# Patient Record
Sex: Female | Born: 1980 | Race: White | Hispanic: No | Smoking: Current every day smoker
Health system: Southern US, Community
[De-identification: ages and names within clinical notes are randomized; demographics above are authoritative.]

## PROBLEM LIST (undated history)

## (undated) DIAGNOSIS — M659 Synovitis and tenosynovitis, unspecified: Secondary | ICD-10-CM

## (undated) DIAGNOSIS — B1920 Unspecified viral hepatitis C without hepatic coma: Secondary | ICD-10-CM

## (undated) DIAGNOSIS — B009 Herpesviral infection, unspecified: Secondary | ICD-10-CM

## (undated) DIAGNOSIS — O98519 Other viral diseases complicating pregnancy, unspecified trimester: Secondary | ICD-10-CM

## (undated) DIAGNOSIS — I82629 Acute embolism and thrombosis of deep veins of unspecified upper extremity: Secondary | ICD-10-CM

## (undated) DIAGNOSIS — J45909 Unspecified asthma, uncomplicated: Secondary | ICD-10-CM

## (undated) DIAGNOSIS — F1910 Other psychoactive substance abuse, uncomplicated: Secondary | ICD-10-CM

## (undated) DIAGNOSIS — A4902 Methicillin resistant Staphylococcus aureus infection, unspecified site: Secondary | ICD-10-CM

## (undated) HISTORY — PX: NECK SURGERY: SHX720

---

## 2003-11-14 DIAGNOSIS — Z8759 Personal history of other complications of pregnancy, childbirth and the puerperium: Secondary | ICD-10-CM

## 2004-09-27 ENCOUNTER — Emergency Department: Payer: Self-pay | Admitting: Emergency Medicine

## 2004-09-30 ENCOUNTER — Observation Stay: Payer: Self-pay

## 2004-10-03 ENCOUNTER — Emergency Department: Payer: Self-pay | Admitting: Emergency Medicine

## 2004-10-09 ENCOUNTER — Observation Stay: Payer: Self-pay | Admitting: Obstetrics and Gynecology

## 2008-06-21 ENCOUNTER — Emergency Department: Payer: Self-pay | Admitting: Emergency Medicine

## 2008-09-09 ENCOUNTER — Ambulatory Visit (HOSPITAL_COMMUNITY): Admission: RE | Admit: 2008-09-09 | Discharge: 2008-09-10 | Payer: Self-pay | Admitting: Neurosurgery
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
## 2011-07-29 ENCOUNTER — Emergency Department: Payer: Self-pay | Admitting: Emergency Medicine

## 2011-08-15 LAB — CBC
HCT: 35.1 — ABNORMAL LOW
Hemoglobin: 12.1
MCV: 96.9
Platelets: 251
WBC: 5.4

## 2011-08-15 LAB — URINALYSIS, ROUTINE W REFLEX MICROSCOPIC
Bilirubin Urine: NEGATIVE
Glucose, UA: NEGATIVE
Hgb urine dipstick: NEGATIVE
Ketones, ur: NEGATIVE
Specific Gravity, Urine: 1.013
Urobilinogen, UA: 0.2
pH: 7

## 2011-08-15 LAB — PREGNANCY, URINE: Preg Test, Ur: NEGATIVE

## 2011-08-15 LAB — URINE CULTURE: Culture: NO GROWTH

## 2011-11-13 ENCOUNTER — Emergency Department: Payer: Self-pay | Admitting: *Deleted

## 2012-11-13 DIAGNOSIS — O322XX0 Maternal care for transverse and oblique lie, not applicable or unspecified: Secondary | ICD-10-CM

## 2012-11-13 DIAGNOSIS — Z8759 Personal history of other complications of pregnancy, childbirth and the puerperium: Secondary | ICD-10-CM

## 2013-03-10 ENCOUNTER — Emergency Department: Payer: Self-pay | Admitting: Emergency Medicine

## 2013-03-17 DIAGNOSIS — F17200 Nicotine dependence, unspecified, uncomplicated: Secondary | ICD-10-CM | POA: Insufficient documentation

## 2013-03-18 DIAGNOSIS — F1910 Other psychoactive substance abuse, uncomplicated: Secondary | ICD-10-CM | POA: Insufficient documentation

## 2013-03-18 DIAGNOSIS — F1210 Cannabis abuse, uncomplicated: Secondary | ICD-10-CM | POA: Insufficient documentation

## 2013-03-19 DIAGNOSIS — A4902 Methicillin resistant Staphylococcus aureus infection, unspecified site: Secondary | ICD-10-CM

## 2013-03-19 HISTORY — DX: Methicillin resistant Staphylococcus aureus infection, unspecified site: A49.02

## 2013-05-18 DIAGNOSIS — O98519 Other viral diseases complicating pregnancy, unspecified trimester: Secondary | ICD-10-CM | POA: Insufficient documentation

## 2013-05-18 DIAGNOSIS — B009 Herpesviral infection, unspecified: Secondary | ICD-10-CM

## 2013-05-18 DIAGNOSIS — Z98891 History of uterine scar from previous surgery: Secondary | ICD-10-CM | POA: Insufficient documentation

## 2013-05-18 HISTORY — DX: Herpesviral infection, unspecified: B00.9

## 2013-05-18 HISTORY — DX: Other viral diseases complicating pregnancy, unspecified trimester: O98.519

## 2013-07-17 DIAGNOSIS — B1920 Unspecified viral hepatitis C without hepatic coma: Secondary | ICD-10-CM

## 2013-07-17 HISTORY — DX: Unspecified viral hepatitis C without hepatic coma: B19.20

## 2014-02-24 ENCOUNTER — Emergency Department: Payer: Self-pay | Admitting: Emergency Medicine

## 2014-02-24 LAB — BASIC METABOLIC PANEL
ANION GAP: 4 — AB (ref 7–16)
BUN: 17 mg/dL (ref 7–18)
CALCIUM: 9.1 mg/dL (ref 8.5–10.1)
CHLORIDE: 100 mmol/L (ref 98–107)
Co2: 30 mmol/L (ref 21–32)
Creatinine: 0.96 mg/dL (ref 0.60–1.30)
EGFR (Non-African Amer.): >60
Glucose: 100 mg/dL — ABNORMAL HIGH (ref 65–99)
OSMOLALITY: 270 (ref 275–301)
POTASSIUM: 3.7 mmol/L (ref 3.5–5.1)
Sodium: 134 mmol/L — ABNORMAL LOW (ref 136–145)

## 2014-02-24 LAB — CBC WITH DIFFERENTIAL/PLATELET
BASOS PCT: 0.6 %
Basophil #: 0 10*3/uL (ref 0.0–0.1)
Eosinophil #: 0.2 10*3/uL (ref 0.0–0.7)
Eosinophil %: 3.1 %
HCT: 35.4 % (ref 35.0–47.0)
HGB: 11.7 g/dL — AB (ref 12.0–16.0)
Lymphocyte #: 1.8 10*3/uL (ref 1.0–3.6)
Lymphocyte %: 27.4 %
MCH: 29.6 pg (ref 26.0–34.0)
MCHC: 33 g/dL (ref 32.0–36.0)
MCV: 90 fL (ref 80–100)
Monocyte #: 0.5 x10 3/mm (ref 0.2–0.9)
Monocyte %: 7.5 %
NEUTROS PCT: 61.4 %
Neutrophil #: 4 10*3/uL (ref 1.4–6.5)
PLATELETS: 305 10*3/uL (ref 150–440)
RBC: 3.94 10*6/uL (ref 3.80–5.20)
RDW: 13.1 % (ref 11.5–14.5)
WBC: 6.6 10*3/uL (ref 3.6–11.0)

## 2014-04-13 ENCOUNTER — Emergency Department: Payer: Self-pay | Admitting: Emergency Medicine

## 2014-08-19 DIAGNOSIS — I82629 Acute embolism and thrombosis of deep veins of unspecified upper extremity: Secondary | ICD-10-CM

## 2014-08-19 DIAGNOSIS — M659 Unspecified synovitis and tenosynovitis, unspecified site: Secondary | ICD-10-CM

## 2014-08-19 HISTORY — DX: Acute embolism and thrombosis of deep veins of unspecified upper extremity: I82.629

## 2014-08-19 HISTORY — DX: Synovitis and tenosynovitis, unspecified: M65.9

## 2014-08-19 HISTORY — DX: Unspecified synovitis and tenosynovitis, unspecified site: M65.90

## 2014-11-12 ENCOUNTER — Emergency Department: Payer: Self-pay | Admitting: Emergency Medicine

## 2014-11-13 NOTE — L&D Delivery Note (Signed)
Delivery Summary for Samantha Alphaiffany M Arias  Labor Events:   Preterm labor:   Rupture date:   Rupture time:   Rupture type:   Fluid Color:   Induction:   Augmentation:   Complications:   Cervical ripening:          Delivery:   Episiotomy:   Lacerations:   Repair suture:   Repair # of packets:   Blood loss (ml):    Information for the patient's newborn:  Shan LevansJohnson, Girl Dejanae [[id number]][[id number]]    Delivery 05/18/2015 2:17 PM by  C-Section, Low Transverse Sex:  female Gestational Age: 3752w4d Delivery Clinician:  Hildred LaserAnika Keeleigh Terris Living?: Yes        APGARS  One minute Five minutes Ten minutes  Skin color: 0   0   1    Heart rate: 2   2   2     Grimace: 1   1   2     Muscle tone: 1   2   1     Breathing: 0   1   2    Totals: 4  6  8     Presentation/position:      Resuscitation: Yes = See NRP documentation in Infant Chart See NICU Consult (infant's chart)  Cord information: 3 vessels   Disposition of cord blood:     Blood gases sent?  Complications:   Placenta: Delivered: 05/18/2015 2:17 PM     appearance Newborn Measurements: Weight: 2 lb 11.7 oz (1240 g)  Height: 14.96"  Head circumference:    Chest circumference:    Other providers:   Vale HavenJamie A Coble Manal D [place]  Additional  information: Forceps:   Vacuum:   Breech:   Observed anomalies        Hildred LaserAnika Darlisa Spruiell, MD [facility]

## 2014-12-08 DIAGNOSIS — F1010 Alcohol abuse, uncomplicated: Secondary | ICD-10-CM | POA: Insufficient documentation

## 2014-12-08 DIAGNOSIS — O99320 Drug use complicating pregnancy, unspecified trimester: Secondary | ICD-10-CM

## 2014-12-08 DIAGNOSIS — Z7251 High risk heterosexual behavior: Secondary | ICD-10-CM | POA: Insufficient documentation

## 2014-12-08 DIAGNOSIS — F1910 Other psychoactive substance abuse, uncomplicated: Secondary | ICD-10-CM | POA: Insufficient documentation

## 2014-12-14 ENCOUNTER — Emergency Department: Payer: Self-pay | Admitting: Emergency Medicine

## 2014-12-14 LAB — COMPREHENSIVE METABOLIC PANEL
ALBUMIN: 3.6 g/dL (ref 3.4–5.0)
ANION GAP: 7 (ref 7–16)
AST: 16 U/L (ref 15–37)
Alkaline Phosphatase: 68 U/L (ref 46–116)
BILIRUBIN TOTAL: 0.4 mg/dL (ref 0.2–1.0)
BUN: 8 mg/dL (ref 7–18)
CHLORIDE: 105 mmol/L (ref 98–107)
CREATININE: 0.68 mg/dL (ref 0.60–1.30)
Calcium, Total: 8.6 mg/dL (ref 8.5–10.1)
Co2: 22 mmol/L (ref 21–32)
EGFR (Non-African Amer.): >60
GLUCOSE: 78 mg/dL (ref 65–99)
OSMOLALITY: 265 (ref 275–301)
POTASSIUM: 3.6 mmol/L (ref 3.5–5.1)
SGPT (ALT): 17 U/L (ref 14–63)
SODIUM: 134 mmol/L — AB (ref 136–145)
TOTAL PROTEIN: 8 g/dL (ref 6.4–8.2)

## 2014-12-14 LAB — CBC WITH DIFFERENTIAL/PLATELET
BASOS ABS: 0 10*3/uL (ref 0.0–0.1)
BASOS PCT: 0.3 %
EOS ABS: 0.3 10*3/uL (ref 0.0–0.7)
Eosinophil %: 1.9 %
HCT: 40.1 % (ref 35.0–47.0)
HGB: 13.4 g/dL (ref 12.0–16.0)
LYMPHS PCT: 19.8 %
Lymphocyte #: 2.8 10*3/uL (ref 1.0–3.6)
MCH: 31.8 pg (ref 26.0–34.0)
MCHC: 33.4 g/dL (ref 32.0–36.0)
MCV: 95 fL (ref 80–100)
MONOS PCT: 3.8 %
Monocyte #: 0.5 x10 3/mm (ref 0.2–0.9)
NEUTROS ABS: 10.5 10*3/uL — AB (ref 1.4–6.5)
NEUTROS PCT: 74.2 %
Platelet: 257 10*3/uL (ref 150–440)
RBC: 4.21 10*6/uL (ref 3.80–5.20)
RDW: 15.7 % — AB (ref 11.5–14.5)
WBC: 14.1 10*3/uL — AB (ref 3.6–11.0)

## 2014-12-14 LAB — URINALYSIS, COMPLETE
BACTERIA: NONE SEEN
BILIRUBIN, UR: NEGATIVE
Blood: NEGATIVE
GLUCOSE, UR: NEGATIVE mg/dL (ref 0–75)
KETONE: NEGATIVE
Nitrite: NEGATIVE
PH: 6 (ref 4.5–8.0)
Protein: 30
RBC, UR: NONE SEEN /HPF (ref 0–5)
Specific Gravity: 1.009 (ref 1.003–1.030)

## 2014-12-14 LAB — LIPASE, BLOOD: Lipase: 67 U/L — ABNORMAL LOW (ref 73–393)

## 2015-03-15 LAB — OB RESULTS CONSOLE GC/CHLAMYDIA
Chlamydia: NEGATIVE
Gonorrhea: NEGATIVE

## 2015-04-16 ENCOUNTER — Telehealth: Payer: Self-pay

## 2015-04-16 NOTE — Telephone Encounter (Signed)
-----   Message from Hildred LaserAnika Cherry, MD sent at 04/15/2015  4:19 PM EDT ----- Regarding: normal pap Normal pap.  Can inform.

## 2015-04-16 NOTE — Telephone Encounter (Signed)
Numbers listed in chart are inactive. Will mail pt letter.

## 2015-05-13 ENCOUNTER — Encounter: Payer: Self-pay | Admitting: Obstetrics and Gynecology

## 2015-05-13 ENCOUNTER — Other Ambulatory Visit: Payer: Medicaid Other

## 2015-05-13 ENCOUNTER — Other Ambulatory Visit: Payer: Self-pay

## 2015-05-13 ENCOUNTER — Ambulatory Visit (INDEPENDENT_AMBULATORY_CARE_PROVIDER_SITE_OTHER): Payer: Medicaid Other | Admitting: Obstetrics and Gynecology

## 2015-05-13 VITALS — BP 98/53 | HR 74 | Ht 66.5 in | Wt 143.0 lb

## 2015-05-13 DIAGNOSIS — Z23 Encounter for immunization: Secondary | ICD-10-CM

## 2015-05-13 DIAGNOSIS — Z131 Encounter for screening for diabetes mellitus: Secondary | ICD-10-CM

## 2015-05-13 DIAGNOSIS — O360911 Maternal care for other rhesus isoimmunization, first trimester, fetus 1: Secondary | ICD-10-CM

## 2015-05-13 DIAGNOSIS — K219 Gastro-esophageal reflux disease without esophagitis: Secondary | ICD-10-CM

## 2015-05-13 DIAGNOSIS — O99612 Diseases of the digestive system complicating pregnancy, second trimester: Secondary | ICD-10-CM

## 2015-05-13 DIAGNOSIS — Z3491 Encounter for supervision of normal pregnancy, unspecified, first trimester: Secondary | ICD-10-CM

## 2015-05-13 DIAGNOSIS — O360121 Maternal care for anti-D [Rh] antibodies, second trimester, fetus 1: Secondary | ICD-10-CM

## 2015-05-13 DIAGNOSIS — Z3481 Encounter for supervision of other normal pregnancy, first trimester: Secondary | ICD-10-CM

## 2015-05-13 DIAGNOSIS — O3663X0 Maternal care for excessive fetal growth, third trimester, not applicable or unspecified: Secondary | ICD-10-CM

## 2015-05-13 DIAGNOSIS — Z369 Encounter for antenatal screening, unspecified: Secondary | ICD-10-CM

## 2015-05-13 LAB — POCT URINALYSIS DIPSTICK
BILIRUBIN UA: NEGATIVE
Blood, UA: NEGATIVE
GLUCOSE UA: NEGATIVE
KETONES UA: NEGATIVE
LEUKOCYTES UA: NEGATIVE
Nitrite, UA: NEGATIVE
Protein, UA: NEGATIVE
SPEC GRAV UA: 1.025
UROBILINOGEN UA: 0.2
pH, UA: 6

## 2015-05-13 MED ORDER — TETANUS-DIPHTH-ACELL PERTUSSIS 5-2.5-18.5 LF-MCG/0.5 IM SUSP
0.5000 mL | Freq: Once | INTRAMUSCULAR | Status: AC
  Administered 2015-05-13: 0.5 mL via INTRAMUSCULAR

## 2015-05-13 MED ORDER — PANTOPRAZOLE SODIUM 40 MG PO TBEC: 40.0000 mg | DELAYED_RELEASE_TABLET | Freq: Every day | ORAL | Status: DC

## 2015-05-13 MED ORDER — RHO D IMMUNE GLOBULIN 1500 UNITS IM SOSY
1500.0000 [IU] | PREFILLED_SYRINGE | Freq: Once | INTRAMUSCULAR | Status: AC
  Administered 2015-05-13: 1500 [IU] via INTRAMUSCULAR

## 2015-05-14 ENCOUNTER — Encounter: Payer: Self-pay | Admitting: Obstetrics and Gynecology

## 2015-05-14 LAB — HEMOGLOBIN AND HEMATOCRIT, BLOOD
HEMATOCRIT: 24.6 % — AB (ref 34.0–46.6)
HEMOGLOBIN: 8 g/dL — AB (ref 11.1–15.9)

## 2015-05-14 LAB — GLUCOSE TOLERANCE, 1 HOUR: Glucose, 1Hr PP: 84 mg/dL (ref 65–199)

## 2015-05-15 NOTE — Progress Notes (Signed)
Addendum to prior note:   Patient also complained of heartburn not relieved by Tums, Zantac.  Prescribed Protonix.

## 2015-05-15 NOTE — Progress Notes (Signed)
ROB: Patient reports complaints of occasional belly tightening.  Advised on resting, adequate hydration and Tylenol.  Fundal height lagging.  Discussed with patient.  Will need f/u ultrasound (was scheduled for f/u anatomy scan ~ 2-3 weeks ago but did not f/u).  Discussed causes, including smoking cessation.  Notes she was encouraged by [employer] Department to receive prophylactic antenatal steroids in light of h/o preterm deliveries.  Will order.  Patient for Rhogam and Tdap today.  28 week labs done.  Discussed Pregnancy Home Coordination Program, patient completed forms. RTC in 2 weeks. Ultrasound ordered.

## 2015-05-17 ENCOUNTER — Encounter: Payer: Self-pay | Admitting: Obstetrics and Gynecology

## 2015-05-17 ENCOUNTER — Observation Stay: Payer: Medicaid Other

## 2015-05-17 ENCOUNTER — Inpatient Hospital Stay
Admission: EM | Admit: 2015-05-17 | Discharge: 2015-05-21 | DRG: 765 | Disposition: A | Discharge status: Home / Self Care | Payer: Medicaid Other | Attending: Obstetrics and Gynecology | Admitting: Obstetrics and Gynecology

## 2015-05-17 ENCOUNTER — Other Ambulatory Visit: Payer: Self-pay | Admitting: Obstetrics and Gynecology

## 2015-05-17 DIAGNOSIS — O9842 Viral hepatitis complicating childbirth: Secondary | ICD-10-CM | POA: Diagnosis present

## 2015-05-17 DIAGNOSIS — O479 False labor, unspecified: Secondary | ICD-10-CM | POA: Diagnosis present

## 2015-05-17 DIAGNOSIS — Z3A30 30 weeks gestation of pregnancy: Secondary | ICD-10-CM | POA: Diagnosis present

## 2015-05-17 DIAGNOSIS — O99324 Drug use complicating childbirth: Secondary | ICD-10-CM | POA: Diagnosis present

## 2015-05-17 DIAGNOSIS — K661 Hemoperitoneum: Secondary | ICD-10-CM | POA: Diagnosis present

## 2015-05-17 DIAGNOSIS — D500 Iron deficiency anemia secondary to blood loss (chronic): Secondary | ICD-10-CM | POA: Diagnosis present

## 2015-05-17 DIAGNOSIS — O99314 Alcohol use complicating childbirth: Secondary | ICD-10-CM | POA: Diagnosis present

## 2015-05-17 DIAGNOSIS — A6000 Herpesviral infection of urogenital system, unspecified: Secondary | ICD-10-CM | POA: Diagnosis present

## 2015-05-17 DIAGNOSIS — Z98891 History of uterine scar from previous surgery: Secondary | ICD-10-CM

## 2015-05-17 DIAGNOSIS — O4700 False labor before 37 completed weeks of gestation, unspecified trimester: Secondary | ICD-10-CM | POA: Diagnosis present

## 2015-05-17 DIAGNOSIS — O9832 Other infections with a predominantly sexual mode of transmission complicating childbirth: Secondary | ICD-10-CM | POA: Diagnosis present

## 2015-05-17 DIAGNOSIS — O4593 Premature separation of placenta, unspecified, third trimester: Principal | ICD-10-CM | POA: Diagnosis present

## 2015-05-17 DIAGNOSIS — Y763 Surgical instruments, materials and obstetric and gynecological devices (including sutures) associated with adverse incidents: Secondary | ICD-10-CM

## 2015-05-17 DIAGNOSIS — O3421 Maternal care for scar from previous cesarean delivery: Secondary | ICD-10-CM | POA: Diagnosis present

## 2015-05-17 DIAGNOSIS — O0933 Supervision of pregnancy with insufficient antenatal care, third trimester: Secondary | ICD-10-CM

## 2015-05-17 DIAGNOSIS — O9989 Other specified diseases and conditions complicating pregnancy, childbirth and the puerperium: Secondary | ICD-10-CM | POA: Diagnosis present

## 2015-05-17 DIAGNOSIS — O4100X0 Oligohydramnios, unspecified trimester, not applicable or unspecified: Secondary | ICD-10-CM | POA: Diagnosis present

## 2015-05-17 DIAGNOSIS — B1920 Unspecified viral hepatitis C without hepatic coma: Secondary | ICD-10-CM | POA: Diagnosis present

## 2015-05-17 DIAGNOSIS — O34219 Maternal care for unspecified type scar from previous cesarean delivery: Secondary | ICD-10-CM | POA: Diagnosis present

## 2015-05-17 DIAGNOSIS — Z86718 Personal history of other venous thrombosis and embolism: Secondary | ICD-10-CM

## 2015-05-17 DIAGNOSIS — O99019 Anemia complicating pregnancy, unspecified trimester: Secondary | ICD-10-CM | POA: Diagnosis present

## 2015-05-17 DIAGNOSIS — F1290 Cannabis use, unspecified, uncomplicated: Secondary | ICD-10-CM | POA: Diagnosis present

## 2015-05-17 HISTORY — DX: Herpesviral infection, unspecified: B00.9

## 2015-05-17 HISTORY — DX: Unspecified viral hepatitis C without hepatic coma: B19.20

## 2015-05-17 HISTORY — DX: Other viral diseases complicating pregnancy, unspecified trimester: O98.519

## 2015-05-17 HISTORY — DX: Acute embolism and thrombosis of deep veins of unspecified upper extremity: I82.629

## 2015-05-17 HISTORY — DX: Synovitis and tenosynovitis, unspecified: M65.9

## 2015-05-17 HISTORY — DX: Methicillin resistant Staphylococcus aureus infection, unspecified site: A49.02

## 2015-05-17 LAB — URINE DRUG SCREEN, QUALITATIVE (ARMC ONLY)
AMPHETAMINES, UR SCREEN: NOT DETECTED
BARBITURATES, UR SCREEN: NOT DETECTED
Benzodiazepine, Ur Scrn: NOT DETECTED
Cannabinoid 50 Ng, Ur ~~LOC~~: POSITIVE — AB
Cocaine Metabolite,Ur ~~LOC~~: POSITIVE — AB
MDMA (ECSTASY) UR SCREEN: NOT DETECTED
Methadone Scn, Ur: NOT DETECTED
Opiate, Ur Screen: POSITIVE — AB
Phencyclidine (PCP) Ur S: NOT DETECTED
Tricyclic, Ur Screen: NOT DETECTED

## 2015-05-17 LAB — CBC
HCT: 27.7 % — ABNORMAL LOW (ref 35.0–47.0)
Hemoglobin: 9.1 g/dL — ABNORMAL LOW (ref 12.0–16.0)
MCH: 31.9 pg (ref 26.0–34.0)
MCHC: 32.9 g/dL (ref 32.0–36.0)
MCV: 97 fL (ref 80.0–100.0)
Platelets: 313 10*3/uL (ref 150–440)
RBC: 2.85 MIL/uL — ABNORMAL LOW (ref 3.80–5.20)
RDW: 12.9 % (ref 11.5–14.5)
WBC: 27.9 10*3/uL — ABNORMAL HIGH (ref 3.6–11.0)

## 2015-05-17 LAB — COMPREHENSIVE METABOLIC PANEL
ALT: 11 U/L — ABNORMAL LOW (ref 14–54)
ANION GAP: 12 (ref 5–15)
AST: 28 U/L (ref 15–41)
Albumin: 2.6 g/dL — ABNORMAL LOW (ref 3.5–5.0)
Alkaline Phosphatase: 122 U/L (ref 38–126)
BILIRUBIN TOTAL: 0.6 mg/dL (ref 0.3–1.2)
BUN: 9 mg/dL (ref 6–20)
CALCIUM: 8.3 mg/dL — AB (ref 8.9–10.3)
CO2: 19 mmol/L — AB (ref 22–32)
CREATININE: 0.88 mg/dL (ref 0.44–1.00)
Chloride: 99 mmol/L — ABNORMAL LOW (ref 101–111)
GFR calc Af Amer: >60 mL/min (ref >60)
GLUCOSE: 146 mg/dL — AB (ref 65–99)
POTASSIUM: 4.2 mmol/L (ref 3.5–5.1)
SODIUM: 130 mmol/L — AB (ref 135–145)
TOTAL PROTEIN: 6.1 g/dL — AB (ref 6.5–8.1)

## 2015-05-17 LAB — DIFFERENTIAL
Basophils Absolute: 0 10*3/uL (ref 0–0.1)
Basophils Relative: 0 %
EOS PCT: 0 %
Eosinophils Absolute: 0 10*3/uL (ref 0–0.7)
Lymphocytes Relative: 2 %
Lymphs Abs: 0.7 10*3/uL — ABNORMAL LOW (ref 1.0–3.6)
MONO ABS: 0.6 10*3/uL (ref 0.2–0.9)
Monocytes Relative: 2 %
Neutro Abs: 26.6 10*3/uL — ABNORMAL HIGH (ref 1.4–6.5)
Neutrophils Relative %: 96 %

## 2015-05-17 MED ORDER — DIPHENHYDRAMINE HCL 50 MG/ML IJ SOLN: 25.0000 mg | Freq: Four times a day (QID) | INTRAMUSCULAR | Status: DC | PRN

## 2015-05-17 MED ORDER — CEPHALEXIN 500 MG PO CAPS: 500.0000 mg | ORAL_CAPSULE | Freq: Four times a day (QID) | ORAL | Status: DC

## 2015-05-17 MED ORDER — MAGNESIUM SULFATE 50 % IJ SOLN
2.0000 g/h | INTRAVENOUS | Status: DC
  Administered 2015-05-17: 2 g/h via INTRAVENOUS
  Filled 2015-05-17: qty 80

## 2015-05-17 MED ORDER — HYDROCODONE-ACETAMINOPHEN 5-325 MG PO TABS
1.0000 | ORAL_TABLET | ORAL | Status: DC | PRN
  Administered 2015-05-18 (×2): 1 via ORAL

## 2015-05-17 MED ORDER — TERBUTALINE SULFATE 1 MG/ML IJ SOLN
INTRAMUSCULAR | Status: AC
  Administered 2015-05-17: 0.25 mg
  Filled 2015-05-17: qty 1

## 2015-05-17 MED ORDER — ASPIRIN EC 81 MG PO TBEC
81.0000 mg | DELAYED_RELEASE_TABLET | Freq: Every day | ORAL | Status: DC
  Administered 2015-05-18: 81 mg via ORAL
  Filled 2015-05-17 (×2): qty 1

## 2015-05-17 MED ORDER — FENTANYL CITRATE (PF) 100 MCG/2ML IJ SOLN
INTRAMUSCULAR | Status: AC
  Administered 2015-05-17: 50 ug via INTRAVENOUS
  Filled 2015-05-17: qty 2

## 2015-05-17 MED ORDER — DIPHENHYDRAMINE HCL 25 MG PO CAPS: 25.0000 mg | ORAL_CAPSULE | Freq: Four times a day (QID) | ORAL | Status: DC | PRN

## 2015-05-17 MED ORDER — SODIUM CHLORIDE 0.9 % IV SOLN
INTRAVENOUS | Status: DC
  Administered 2015-05-18 (×2): via INTRAVENOUS

## 2015-05-17 MED ORDER — PANTOPRAZOLE SODIUM 40 MG PO TBEC
40.0000 mg | DELAYED_RELEASE_TABLET | Freq: Every day | ORAL | Status: DC
  Administered 2015-05-18 – 2015-05-21 (×3): 40 mg via ORAL
  Filled 2015-05-17 (×4): qty 1

## 2015-05-17 MED ORDER — TERBUTALINE SULFATE 1 MG/ML IJ SOLN: 0.2500 mg | Freq: Once | INTRAMUSCULAR | Status: AC | PRN

## 2015-05-17 MED ORDER — PRENATAL MULTIVITAMIN CH: 1.0000 | ORAL_TABLET | Freq: Every day | ORAL | Status: DC

## 2015-05-17 MED ORDER — DOCUSATE SODIUM 100 MG PO CAPS
100.0000 mg | ORAL_CAPSULE | Freq: Every day | ORAL | Status: DC
Start: 2015-05-17 — End: 2015-05-18
  Administered 2015-05-18: 100 mg via ORAL
  Filled 2015-05-17: qty 1

## 2015-05-17 MED ORDER — TERBUTALINE SULFATE 1 MG/ML IJ SOLN
INTRAMUSCULAR | Status: AC
  Filled 2015-05-17: qty 1

## 2015-05-17 MED ORDER — BETAMETHASONE SOD PHOS & ACET 6 (3-3) MG/ML IJ SUSP
INTRAMUSCULAR | Status: AC
  Administered 2015-05-17: 12 mg via INTRAMUSCULAR
  Filled 2015-05-17: qty 1

## 2015-05-17 MED ORDER — ZOLPIDEM TARTRATE 5 MG PO TABS
5.0000 mg | ORAL_TABLET | Freq: Every evening | ORAL | Status: DC | PRN
  Administered 2015-05-17: 5 mg via ORAL

## 2015-05-17 MED ORDER — MAGNESIUM SULFATE BOLUS VIA INFUSION
4.0000 g | Freq: Once | INTRAVENOUS | Status: DC
Start: 2015-05-17 — End: 2015-05-18
  Filled 2015-05-17: qty 500

## 2015-05-17 MED ORDER — CEFAZOLIN SODIUM 1-5 GM-% IV SOLN
1.0000 g | Freq: Three times a day (TID) | INTRAVENOUS | Status: DC
  Administered 2015-05-17 – 2015-05-18 (×2): 1 g via INTRAVENOUS
  Filled 2015-05-17 (×3): qty 50

## 2015-05-17 MED ORDER — ACETAMINOPHEN 325 MG PO TABS: 650.0000 mg | ORAL_TABLET | ORAL | Status: DC | PRN

## 2015-05-17 MED ORDER — BETAMETHASONE SOD PHOS & ACET 6 (3-3) MG/ML IJ SUSP
12.0000 mg | INTRAMUSCULAR | Status: DC
  Administered 2015-05-17: 12 mg via INTRAMUSCULAR
  Filled 2015-05-17: qty 2

## 2015-05-17 MED ORDER — MAGNESIUM SULFATE 4 GM/100ML IV SOLN
INTRAVENOUS | Status: AC
  Administered 2015-05-17: 4 g
  Filled 2015-05-17: qty 100

## 2015-05-17 MED ORDER — CEFAZOLIN SODIUM 1-5 GM-% IV SOLN
INTRAVENOUS | Status: AC
  Administered 2015-05-17: 1 g via INTRAVENOUS
  Filled 2015-05-17: qty 50

## 2015-05-17 MED ORDER — ZOLPIDEM TARTRATE 5 MG PO TABS
ORAL_TABLET | ORAL | Status: AC
  Administered 2015-05-17: 5 mg via ORAL
  Filled 2015-05-17: qty 1

## 2015-05-17 MED ORDER — BUTORPHANOL TARTRATE 1 MG/ML IJ SOLN
INTRAMUSCULAR | Status: AC
  Administered 2015-05-17: 2 mg via INTRAVENOUS
  Filled 2015-05-17: qty 2

## 2015-05-17 MED ORDER — BUTORPHANOL TARTRATE 1 MG/ML IJ SOLN
2.0000 mg | INTRAMUSCULAR | Status: DC | PRN
Start: 2015-05-17 — End: 2015-05-18
  Administered 2015-05-17 – 2015-05-18 (×2): 2 mg via INTRAVENOUS

## 2015-05-17 MED ORDER — AZITHROMYCIN 500 MG PO TABS
1000.0000 mg | ORAL_TABLET | Freq: Once | ORAL | Status: AC
  Administered 2015-05-17: 1000 mg via ORAL
  Filled 2015-05-17: qty 2

## 2015-05-17 NOTE — H&P (Addendum)
Subjective:  [name] is a 34 y.o. G3 P0 female with Estimated Date of Delivery: 07/23/15 by ultrasound (2nd trimester)   at 54 and 3/[redacted] weeks gestation who is being admitted for preterm contractions and possible rupture of membranes.  Her current obstetrical history is significant for prior c-section x 1 (classical), history of pre-eclampsia in prior 2 pregnancies, 2 fetal demises, h/o drug use (cocaine, marijuana, alcohol). Denies any recent drug use outside of marijuana 3 days ago. Patient reports backache, occasional contractions and leaking fluid since 10:00 a.m.Marland Kitchen   Fetal Movement: normal.    Obstetric History   G3   P1   T0   P1   A1   TAB0   SAB0   E0   M0   L0     # Outcome Date GA Lbr Len/2nd Weight Sex Delivery Anes PTL Lv  3 Current           2 Preterm 2014 [redacted]w[redacted]d   F CS-classical   ND     Complications: History of pre-eclampsia  1 AB 2005 [redacted]w[redacted]d   M    FD     Complications: History of pre-eclampsia      Prenatal Course Source of Care: [facility] with onset of care at 13 weeks, transferred [facility] care at 24 weeks to [facility].   Pregnancy complications or risks: Patient Active Problem List   Diagnosis Date Noted  . Preterm uterine contractions 05/17/2015  . Oligohydramnios antepartum 05/17/2015  . H/O cesarean section complicating pregnancy 05/17/2015  . Preterm contractions 05/17/2015  . AA (alcohol abuse) 12/08/2014  . Drug abuse during pregnancy 12/08/2014  . High risk sexual behavior 12/08/2014  . Tenosynovitis 08/19/2014  . Acute deep vein thrombosis of arm 08/19/2014  . HCV (hepatitis C virus) 07/17/2013  . H/O cesarean section 05/18/2013  . Herpes infection in pregnancy 05/18/2013  . Infection with methicillin-resistant Staphylococcus aureus 03/19/2013  . Cannabis abuse 03/18/2013  . Polysubstance abuse 03/18/2013  . Compulsive tobacco user syndrome 03/17/2013   She undecided She desires undecided for postpartum contraception.   Prenatal  labs and studies:  Did not receive prenatal records from [facility], old labs in Care Everywhere with labs from 2014.  ABO, Rh: A negative (received Rhogam 05/13/15)   Antibody:   Rubella:   RPR:    HBsAg:    HIV:    GBS:  1 hr Glucola  Pending Genetic screening pending records Anatomy US not completed, patient missed appointment  Prenatal Transfer Tool  Maternal Diabetes: No Genetic Screening: records not received Maternal Ultrasounds/Referrals: records not received Fetal Ultrasounds or other Referrals:   records not received Maternal Substance Abuse:  Yes:  Type: Smoker, Marijuana, Cocaine Significant Maternal Medications:  None Significant Maternal Lab Results:  records not received  Past Medical History  Diagnosis Date  . HCV (hepatitis C virus) 07/17/2013    Last Assessment & Plan:  - positive antibody most recently 08/2014 - hep C RNA 08/2014 was negative - prior to that, HCV RNA [id number] in 06/2013. No record for genotype or imaging.   - patient counseled that she could become re-infected with continued IVDU or unprotected sexual encounters.   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   Last Assessment & Plan:  Discussed routine use of Valtrex prophylaxis at 36 weeks. She will be delivered by cesarean at 36-37 weeks.   . Infection with methicillin-resistant Staphylococcus aureus 03/19/2013  .  Tenosynovitis 08/19/2014  . Acute deep vein thrombosis of arm 08/19/2014    Last Assessment & Plan:  - diagnosed 08/2014 in hospital - s/p heparin gtt - patient never took her xarelto x 3 month course that was planned - no symptoms today.  No further treatment - we discussed avoiding IVDU (see below) to reduce her chances of provoked upper extremity DVT     Past Surgical History  Procedure Laterality Date  . Cesarean section    . Neck surgery      Fusion    History   Social History  . Marital Status: Single    Spouse Name: N/A  . Number  of Children: N/A  . Years of Education: N/A   Social History Main Topics  . Smoking status: Current Every Day Smoker -- 1.00 packs/day for 7 years    Types: Cigarettes  . Smokeless tobacco: Never Used  . Alcohol Use: No  . Drug Use: Yes    Special: Marijuana  . Sexual Activity: Yes    Birth Control/ Protection: None     Comment: Pregnant   Other Topics Concern  . Not on file   Social History Narrative    Family History  Problem Relation Age of Onset  . Diabetes Mother   . COPD Mother   . Hypertension Mother   . Lung cancer Mother   . Alcohol abuse Father   . Lung cancer Maternal Grandmother     Prescriptions prior to admission  Medication Sig Dispense Refill Last Dose  . aspirin EC 81 MG tablet Take 81 mg by mouth daily.   05/17/2015 at Unknown time  . pantoprazole (PROTONIX) 40 MG tablet Take 1 tablet (40 mg total) by mouth daily. 30 tablet 3 05/17/2015 at Unknown time  . Prenatal Vit-Fe Fumarate-FA (MULTIVITAMIN-PRENATAL) 27-0.8 MG TABS tablet Take 1 tablet by mouth daily at 12 noon.   05/17/2015 at Unknown time    Allergies  Allergen Reactions  . Amoxicillin Rash  . Latex   . Vancomycin     Review of Systems: Negative except for what is mentioned in HPI.  Pertinent Labs/Studies:   Results for orders placed or performed during the hospital encounter of 05/17/15 (from the past 24 hour(s))  Urine Drug Screen, Qualitative ([facility] only)     Status: Abnormal   Collection Time: 05/17/15  8:39 PM  Result Value Ref Range   Tricyclic, Ur Screen NONE DETECTED NONE DETECTED   Amphetamines, Ur Screen NONE DETECTED NONE DETECTED   MDMA (Ecstasy)Ur Screen NONE DETECTED NONE DETECTED   Cocaine Metabolite,Ur [place] POSITIVE (A) NONE DETECTED   Opiate, Ur Screen POSITIVE (A) NONE DETECTED   Phencyclidine (PCP) Ur S NONE DETECTED NONE DETECTED   Cannabinoid 50 Ng, Ur [place] POSITIVE (A) NONE DETECTED   Barbiturates, Ur Screen NONE DETECTED NONE DETECTED   Benzodiazepine, Ur Scrn NONE  DETECTED NONE DETECTED   Methadone Scn, Ur NONE DETECTED NONE DETECTED    Objective:   Vital signs in last 24 hours: Temp:  [97.6 F (36.4 C)] 97.6 F (36.4 C) (07/04 2035) Pulse Rate:  [93-116] 116 (07/04 2022) Resp:  [18] 18 (07/04 2035) BP: (99-128)/(43-85) 99/85 mmHg (07/04 2022) Weight:  [143 lb (64.864 kg)] 143 lb (64.864 kg) (07/04 2035)   General:   alert and moderate distress  Skin:   normal and old track marks  in forearms and wrists from prior drug use  HEENT:  extra ocular movement intact  Lungs:   clear to auscultation  bilaterally  Heart:   regular rate and rhythm, S1, S2 normal, no murmur, click, rub or gallop  Breasts:   not examined  Abdomen:  normal findings: soft and abnormal findings:  tenderness around abdomen  Pelvis:  External genitalia: normal general appearance Vaginal: normal mucosa without prolapse or lesions and discharge, white and small amount, thick, clumpy Cervix: normal appearance and 1 cm  No gross pooling, nitrazine neg x 2  FHT:  160 BPM.  Moderate variability, accels present, 2  decels present from baseline 140s to 80s lasting 3 min at initial presentation.   Uterine Size: 26 cm  Presentations: unsure  Cervix:    Dilation: 1cm   Effacement: Long   Station:  Floating   Consistency: firm   Position: posterior    Bedside ultrasound:  EGA 29.3 weeks, EDC 07/30/2015, EFW 2 lb 14 oz (1312 g), 29%ile.  1 pocket of fluid noted 2.6 cm. Normal appearing placenta.  Assessment/Plan:  Admit to observation 30 and 3/[redacted] weeks gestation. Not in labor. and Dysfunctional uterine contractions. Obstetrical history significant for pre-eclampsia, preterm delivery, h/o prior classical C-section, and drug abuse.     UDS today positive for cocaine, marijuana, and opiates (likely cause of contractions).  No evidence of abruption as patient without vaginal bleeding.  Terbutaline x 2 given for contractions.  Continue to monitor for signs of abruption.  For official  sono as bedside sono noting low AFI (2.6 cm).  No evidence of rupture seen on exam. Unsure if high leak that has sealed, or if was oligohydramnios prior to today as patient has [facility].  Magnesium sulfate for neuroprotection x 24 hrs.  Antenatal steroids to be administered.  Begin IV antibiotics for suspected PPROM.  Will order admission labs (CBC, full STD screen, CMP, Type and screen) MFM consult in a.m.     Risks, benefits, alternatives and possible complications have been discussed in detail with the patient.  Pre-admission, admission, and post admission procedures and expectations were discussed in detail.  All questions answered, all appropriate consents will be signed at the Hospital. Admission is planned for today.  Continue present management.    Hildred LaserAnika Kewana Sanon, MD [facility] 05/17/2015 10:50 PM

## 2015-05-18 ENCOUNTER — Inpatient Hospital Stay: Payer: Medicaid Other | Admitting: Registered Nurse

## 2015-05-18 ENCOUNTER — Telehealth: Payer: Self-pay

## 2015-05-18 ENCOUNTER — Encounter
Admission: EM | Disposition: A | Discharge status: Home / Self Care | Payer: Self-pay | Source: Home / Self Care | Attending: Obstetrics and Gynecology

## 2015-05-18 ENCOUNTER — Encounter: Payer: Self-pay | Admitting: Obstetrics and Gynecology

## 2015-05-18 ENCOUNTER — Inpatient Hospital Stay: Payer: Medicaid Other

## 2015-05-18 DIAGNOSIS — Z98891 History of uterine scar from previous surgery: Secondary | ICD-10-CM

## 2015-05-18 DIAGNOSIS — D500 Iron deficiency anemia secondary to blood loss (chronic): Secondary | ICD-10-CM | POA: Diagnosis present

## 2015-05-18 DIAGNOSIS — O9832 Other infections with a predominantly sexual mode of transmission complicating childbirth: Secondary | ICD-10-CM | POA: Diagnosis present

## 2015-05-18 DIAGNOSIS — A6000 Herpesviral infection of urogenital system, unspecified: Secondary | ICD-10-CM | POA: Diagnosis present

## 2015-05-18 DIAGNOSIS — F1290 Cannabis use, unspecified, uncomplicated: Secondary | ICD-10-CM | POA: Diagnosis present

## 2015-05-18 DIAGNOSIS — Z3A30 30 weeks gestation of pregnancy: Secondary | ICD-10-CM | POA: Diagnosis present

## 2015-05-18 DIAGNOSIS — O99019 Anemia complicating pregnancy, unspecified trimester: Secondary | ICD-10-CM | POA: Diagnosis present

## 2015-05-18 DIAGNOSIS — O99314 Alcohol use complicating childbirth: Secondary | ICD-10-CM | POA: Diagnosis present

## 2015-05-18 DIAGNOSIS — O3421 Maternal care for scar from previous cesarean delivery: Secondary | ICD-10-CM | POA: Diagnosis present

## 2015-05-18 DIAGNOSIS — B1920 Unspecified viral hepatitis C without hepatic coma: Secondary | ICD-10-CM | POA: Diagnosis present

## 2015-05-18 DIAGNOSIS — O9989 Other specified diseases and conditions complicating pregnancy, childbirth and the puerperium: Secondary | ICD-10-CM | POA: Diagnosis present

## 2015-05-18 DIAGNOSIS — O4100X0 Oligohydramnios, unspecified trimester, not applicable or unspecified: Secondary | ICD-10-CM | POA: Diagnosis present

## 2015-05-18 DIAGNOSIS — K661 Hemoperitoneum: Secondary | ICD-10-CM | POA: Diagnosis present

## 2015-05-18 DIAGNOSIS — Z86718 Personal history of other venous thrombosis and embolism: Secondary | ICD-10-CM | POA: Diagnosis not present

## 2015-05-18 DIAGNOSIS — Z3483 Encounter for supervision of other normal pregnancy, third trimester: Secondary | ICD-10-CM

## 2015-05-18 DIAGNOSIS — O0933 Supervision of pregnancy with insufficient antenatal care, third trimester: Secondary | ICD-10-CM | POA: Diagnosis not present

## 2015-05-18 DIAGNOSIS — O9842 Viral hepatitis complicating childbirth: Secondary | ICD-10-CM | POA: Diagnosis present

## 2015-05-18 DIAGNOSIS — O4593 Premature separation of placenta, unspecified, third trimester: Secondary | ICD-10-CM | POA: Diagnosis present

## 2015-05-18 DIAGNOSIS — O4103X0 Oligohydramnios, third trimester, not applicable or unspecified: Secondary | ICD-10-CM | POA: Diagnosis present

## 2015-05-18 DIAGNOSIS — O99324 Drug use complicating childbirth: Secondary | ICD-10-CM | POA: Diagnosis present

## 2015-05-18 LAB — CBC WITH DIFFERENTIAL/PLATELET
BASOS ABS: 0 10*3/uL (ref 0–0.1)
BASOS ABS: 0 10*3/uL (ref 0–0.1)
Basophils Relative: 0 %
Basophils Relative: 0 %
EOS ABS: 0 10*3/uL (ref 0–0.7)
EOS PCT: 0 %
Eosinophils Absolute: 0 10*3/uL (ref 0–0.7)
Eosinophils Relative: 0 %
HCT: 23.3 % — ABNORMAL LOW (ref 35.0–47.0)
HEMATOCRIT: 17.9 % — AB (ref 35.0–47.0)
HEMOGLOBIN: 7.7 g/dL — AB (ref 12.0–16.0)
Hemoglobin: 5.9 g/dL — ABNORMAL LOW (ref 12.0–16.0)
LYMPHS ABS: 1.2 10*3/uL (ref 1.0–3.6)
LYMPHS PCT: 6 %
Lymphs Abs: 1.5 10*3/uL (ref 1.0–3.6)
MCH: 31.9 pg (ref 26.0–34.0)
MCH: 31.2 pg (ref 26.0–34.0)
MCHC: 32.8 g/dL (ref 32.0–36.0)
MCHC: 32.7 g/dL (ref 32.0–36.0)
MCV: 97 fL (ref 80.0–100.0)
MCV: 95.3 fL (ref 80.0–100.0)
MONO ABS: 1.2 10*3/uL — AB (ref 0.2–0.9)
MONOS PCT: 2 %
Monocytes Absolute: 0.5 10*3/uL (ref 0.2–0.9)
NEUTROS PCT: 92 %
Neutro Abs: 19.3 10*3/uL — ABNORMAL HIGH (ref 1.4–6.5)
Neutro Abs: 24.2 10*3/uL — ABNORMAL HIGH (ref 1.4–6.5)
Neutrophils Relative %: 91 %
Platelets: 303 10*3/uL (ref 150–440)
Platelets: 262 10*3/uL (ref 150–440)
RBC: 2.4 MIL/uL — AB (ref 3.80–5.20)
RBC: 1.88 MIL/uL — ABNORMAL LOW (ref 3.80–5.20)
RDW: 12.8 % (ref 11.5–14.5)
RDW: 12.5 % (ref 11.5–14.5)
WBC: 21 10*3/uL — ABNORMAL HIGH (ref 3.6–11.0)
WBC: 26.8 10*3/uL — AB (ref 3.6–11.0)

## 2015-05-18 LAB — PREPARE RBC (CROSSMATCH)

## 2015-05-18 LAB — ABO/RH: ABO/RH(D): A NEG

## 2015-05-18 LAB — MAGNESIUM
MAGNESIUM: 4.7 mg/dL — AB (ref 1.7–2.4)
Magnesium: 4.8 mg/dL — ABNORMAL HIGH (ref 1.7–2.4)

## 2015-05-18 LAB — RAPID HIV SCREEN (HIV 1/2 AB+AG)
HIV 1/2 ANTIBODIES: NONREACTIVE
HIV-1 P24 ANTIGEN - HIV24: NONREACTIVE

## 2015-05-18 LAB — HEPATITIS B SURFACE ANTIGEN
HEP B S AG: NEGATIVE
Hepatitis B Surface Ag: NEGATIVE

## 2015-05-18 SURGERY — Surgical Case
Anesthesia: General | Wound class: Clean Contaminated

## 2015-05-18 MED ORDER — DIPHENHYDRAMINE HCL 50 MG/ML IJ SOLN
12.5000 mg | Freq: Four times a day (QID) | INTRAMUSCULAR | Status: DC | PRN
  Administered 2015-05-18: 12.5 mg via INTRAVENOUS
  Filled 2015-05-18: qty 1

## 2015-05-18 MED ORDER — MEASLES, MUMPS & RUBELLA VAC ~~LOC~~ INJ: 0.5000 mL | INJECTION | Freq: Once | SUBCUTANEOUS | Status: DC

## 2015-05-18 MED ORDER — HYDROMORPHONE 0.3 MG/ML IV SOLN
INTRAVENOUS | Status: DC
Start: 2015-05-18 — End: 2015-05-19
  Administered 2015-05-18 – 2015-05-19 (×3): via INTRAVENOUS
  Filled 2015-05-18 (×3): qty 25

## 2015-05-18 MED ORDER — MAGNESIUM HYDROXIDE 400 MG/5ML PO SUSP
30.0000 mL | Freq: Every day | ORAL | Status: DC | PRN
  Filled 2015-05-18: qty 30

## 2015-05-18 MED ORDER — SENNOSIDES-DOCUSATE SODIUM 8.6-50 MG PO TABS
2.0000 | ORAL_TABLET | ORAL | Status: DC
  Administered 2015-05-19 – 2015-05-20 (×3): 2 via ORAL
  Filled 2015-05-18 (×3): qty 2

## 2015-05-18 MED ORDER — FENTANYL CITRATE (PF) 100 MCG/2ML IJ SOLN
INTRAMUSCULAR | Status: AC
  Administered 2015-05-18: 50 ug via INTRAVENOUS
  Filled 2015-05-18: qty 2

## 2015-05-18 MED ORDER — OXYTOCIN 40 UNITS IN LACTATED RINGERS INFUSION - SIMPLE MED: 62.5000 mL/h | INTRAVENOUS | Status: DC

## 2015-05-18 MED ORDER — SUCCINYLCHOLINE CHLORIDE 20 MG/ML IJ SOLN
INTRAMUSCULAR | Status: DC | PRN
  Administered 2015-05-18: 100 mg via INTRAVENOUS

## 2015-05-18 MED ORDER — FENTANYL CITRATE (PF) 100 MCG/2ML IJ SOLN
25.0000 ug | INTRAMUSCULAR | Status: AC | PRN
  Administered 2015-05-17: 50 ug via INTRAVENOUS
  Administered 2015-05-18: 25 ug via INTRAVENOUS
  Administered 2015-05-18: 50 ug via INTRAVENOUS
  Administered 2015-05-18 (×3): 25 ug via INTRAVENOUS

## 2015-05-18 MED ORDER — CEFAZOLIN SODIUM 1-5 GM-% IV SOLN
INTRAVENOUS | Status: AC
  Administered 2015-05-18: 1 g via INTRAVENOUS
  Filled 2015-05-18: qty 50

## 2015-05-18 MED ORDER — ROCURONIUM BROMIDE 100 MG/10ML IV SOLN
INTRAVENOUS | Status: DC | PRN
Start: 2015-05-18 — End: 2015-05-18
  Administered 2015-05-18: 10 mg via INTRAVENOUS

## 2015-05-18 MED ORDER — FENTANYL CITRATE (PF) 100 MCG/2ML IJ SOLN
INTRAMUSCULAR | Status: DC | PRN
  Administered 2015-05-18: 100 ug via INTRAVENOUS
  Administered 2015-05-18 (×3): 50 ug via INTRAVENOUS

## 2015-05-18 MED ORDER — FERROUS SULFATE 325 (65 FE) MG PO TABS
325.0000 mg | ORAL_TABLET | Freq: Three times a day (TID) | ORAL | Status: DC
  Administered 2015-05-19 – 2015-05-21 (×6): 325 mg via ORAL
  Filled 2015-05-18 (×7): qty 1

## 2015-05-18 MED ORDER — LANOLIN HYDROUS EX OINT: 1.0000 "application " | TOPICAL_OINTMENT | CUTANEOUS | Status: DC | PRN

## 2015-05-18 MED ORDER — METHYLERGONOVINE MALEATE 0.2 MG PO TABS: 0.2000 mg | ORAL_TABLET | ORAL | Status: DC | PRN

## 2015-05-18 MED ORDER — ONDANSETRON HCL 4 MG/2ML IJ SOLN
INTRAMUSCULAR | Status: DC | PRN
  Administered 2015-05-18: 4 mg via INTRAVENOUS

## 2015-05-18 MED ORDER — SODIUM CHLORIDE 0.9 % IJ SOLN: 9.0000 mL | INTRAMUSCULAR | Status: DC | PRN

## 2015-05-18 MED ORDER — GLYCOPYRROLATE 0.2 MG/ML IJ SOLN
INTRAMUSCULAR | Status: DC | PRN
  Administered 2015-05-18: 0.4 mg via INTRAVENOUS

## 2015-05-18 MED ORDER — CEFAZOLIN SODIUM-DEXTROSE 2-3 GM-% IV SOLR
INTRAVENOUS | Status: AC
  Administered 2015-05-18: 2 g via INTRAVENOUS
  Filled 2015-05-18: qty 50

## 2015-05-18 MED ORDER — OXYCODONE-ACETAMINOPHEN 5-325 MG PO TABS
2.0000 | ORAL_TABLET | ORAL | Status: DC | PRN
  Administered 2015-05-19 – 2015-05-21 (×9): 2 via ORAL
  Filled 2015-05-18 (×9): qty 2

## 2015-05-18 MED ORDER — ACYCLOVIR 200 MG PO CAPS
400.0000 mg | ORAL_CAPSULE | Freq: Two times a day (BID) | ORAL | Status: DC
  Filled 2015-05-18 (×2): qty 2

## 2015-05-18 MED ORDER — DIPHENHYDRAMINE HCL 12.5 MG/5ML PO ELIX
12.5000 mg | ORAL_SOLUTION | Freq: Four times a day (QID) | ORAL | Status: DC | PRN
  Filled 2015-05-18: qty 5

## 2015-05-18 MED ORDER — OXYTOCIN 40 UNITS IN LACTATED RINGERS INFUSION - SIMPLE MED
INTRAVENOUS | Status: DC | PRN
  Administered 2015-05-18: 500 mL via INTRAVENOUS

## 2015-05-18 MED ORDER — LIDOCAINE 5 % EX PTCH: 1.0000 | MEDICATED_PATCH | CUTANEOUS | Status: DC

## 2015-05-18 MED ORDER — BUTORPHANOL TARTRATE 1 MG/ML IJ SOLN
INTRAMUSCULAR | Status: AC
  Administered 2015-05-18: 2 mg via INTRAVENOUS
  Filled 2015-05-18: qty 2

## 2015-05-18 MED ORDER — OXYCODONE-ACETAMINOPHEN 5-325 MG PO TABS: 2.0000 | ORAL_TABLET | ORAL | Status: DC | PRN

## 2015-05-18 MED ORDER — TETANUS-DIPHTH-ACELL PERTUSSIS 5-2.5-18.5 LF-MCG/0.5 IM SUSP: 0.5000 mL | Freq: Once | INTRAMUSCULAR | Status: DC

## 2015-05-18 MED ORDER — SODIUM CHLORIDE 0.9 % IV SOLN
Freq: Once | INTRAVENOUS | Status: AC
  Administered 2015-05-18: 22:00:00 via INTRAVENOUS

## 2015-05-18 MED ORDER — OXYCODONE HCL 5 MG/5ML PO SOLN: 5.0000 mg | Freq: Once | ORAL | Status: DC | PRN

## 2015-05-18 MED ORDER — PROPOFOL 10 MG/ML IV BOLUS
INTRAVENOUS | Status: DC | PRN
  Administered 2015-05-18: 130 mg via INTRAVENOUS

## 2015-05-18 MED ORDER — PRENATAL MULTIVITAMIN CH
1.0000 | ORAL_TABLET | Freq: Every day | ORAL | Status: DC
  Administered 2015-05-20 – 2015-05-21 (×2): 1 via ORAL
  Filled 2015-05-18 (×3): qty 1

## 2015-05-18 MED ORDER — HYDROCODONE-ACETAMINOPHEN 5-325 MG PO TABS
ORAL_TABLET | ORAL | Status: AC
  Administered 2015-05-18: 1 via ORAL
  Filled 2015-05-18: qty 1

## 2015-05-18 MED ORDER — FENTANYL CITRATE (PF) 100 MCG/2ML IJ SOLN
INTRAMUSCULAR | Status: AC
  Administered 2015-05-18: 25 ug via INTRAVENOUS
  Filled 2015-05-18: qty 2

## 2015-05-18 MED ORDER — DIPHENHYDRAMINE HCL 25 MG PO CAPS: 25.0000 mg | ORAL_CAPSULE | Freq: Four times a day (QID) | ORAL | Status: DC | PRN

## 2015-05-18 MED ORDER — MAGNESIUM HYDROXIDE 400 MG/5ML PO SUSP: 30.0000 mL | ORAL | Status: DC | PRN

## 2015-05-18 MED ORDER — ONDANSETRON HCL 4 MG/2ML IJ SOLN: 4.0000 mg | Freq: Four times a day (QID) | INTRAMUSCULAR | Status: DC | PRN

## 2015-05-18 MED ORDER — LIDOCAINE 5 % EX PTCH
1.0000 | MEDICATED_PATCH | CUTANEOUS | Status: DC
  Administered 2015-05-20 – 2015-05-21 (×2): 1 via TRANSDERMAL
  Filled 2015-05-18 (×2): qty 1

## 2015-05-18 MED ORDER — SIMETHICONE 80 MG PO CHEW
80.0000 mg | CHEWABLE_TABLET | ORAL | Status: DC | PRN
  Administered 2015-05-20: 80 mg via ORAL
  Filled 2015-05-18: qty 1

## 2015-05-18 MED ORDER — SODIUM CHLORIDE 0.9 % IV SOLN
INTRAVENOUS | Status: DC | PRN
  Administered 2015-05-18: 15:00:00 via INTRAVENOUS

## 2015-05-18 MED ORDER — LACTATED RINGERS IV SOLN
INTRAVENOUS | Status: DC
  Administered 2015-05-19 (×2): via INTRAVENOUS

## 2015-05-18 MED ORDER — ACYCLOVIR 400 MG PO TABS
400.0000 mg | ORAL_TABLET | Freq: Two times a day (BID) | ORAL | Status: DC
  Filled 2015-05-18 (×2): qty 1

## 2015-05-18 MED ORDER — NALOXONE HCL 0.4 MG/ML IJ SOLN
0.4000 mg | INTRAMUSCULAR | Status: DC | PRN
Start: 2015-05-18 — End: 2015-05-19

## 2015-05-18 MED ORDER — SIMETHICONE 80 MG PO CHEW
80.0000 mg | CHEWABLE_TABLET | ORAL | Status: DC
  Administered 2015-05-19 – 2015-05-20 (×3): 80 mg via ORAL
  Filled 2015-05-18 (×3): qty 1

## 2015-05-18 MED ORDER — LIDOCAINE 5 % EX PTCH
MEDICATED_PATCH | CUTANEOUS | Status: AC
  Filled 2015-05-18: qty 1

## 2015-05-18 MED ORDER — NEOSTIGMINE METHYLSULFATE 10 MG/10ML IV SOLN
INTRAVENOUS | Status: DC | PRN
  Administered 2015-05-18: 3 mg via INTRAVENOUS

## 2015-05-18 MED ORDER — SODIUM CHLORIDE 0.9 % IV BOLUS (SEPSIS)
500.0000 mL | Freq: Once | INTRAVENOUS | Status: AC
  Administered 2015-05-18: 500 mL via INTRAVENOUS

## 2015-05-18 MED ORDER — MENTHOL 3 MG MT LOZG: 1.0000 | LOZENGE | OROMUCOSAL | Status: DC | PRN

## 2015-05-18 MED ORDER — OXYCODONE HCL 5 MG PO TABS: 5.0000 mg | ORAL_TABLET | Freq: Once | ORAL | Status: DC | PRN

## 2015-05-18 MED ORDER — LIDOCAINE 5 % EX PTCH
MEDICATED_PATCH | CUTANEOUS | Status: DC | PRN
  Administered 2015-05-18: 1 via TRANSDERMAL

## 2015-05-18 MED ORDER — METHYLERGONOVINE MALEATE 0.2 MG/ML IJ SOLN
0.2000 mg | INTRAMUSCULAR | Status: DC | PRN
  Filled 2015-05-18: qty 1

## 2015-05-18 MED ORDER — CITRIC ACID-SODIUM CITRATE 334-500 MG/5ML PO SOLN
ORAL | Status: AC
  Administered 2015-05-18: 14:00:00 via ORAL
  Filled 2015-05-18: qty 15

## 2015-05-18 MED ORDER — ZOLPIDEM TARTRATE 5 MG PO TABS: 5.0000 mg | ORAL_TABLET | Freq: Every evening | ORAL | Status: DC | PRN

## 2015-05-18 MED ORDER — IBUPROFEN 600 MG PO TABS
600.0000 mg | ORAL_TABLET | Freq: Four times a day (QID) | ORAL | Status: DC
  Administered 2015-05-18 – 2015-05-19 (×3): 600 mg via ORAL
  Filled 2015-05-18 (×2): qty 1

## 2015-05-18 MED ORDER — IBUPROFEN 600 MG PO TABS
600.0000 mg | ORAL_TABLET | Freq: Four times a day (QID) | ORAL | Status: DC
  Filled 2015-05-18: qty 1

## 2015-05-18 SURGICAL SUPPLY — 29 items
BAG COUNTER SPONGE EZ (MISCELLANEOUS) ×2 IMPLANT
BARRIER ADHS 3X4 INTERCEED (GAUZE/BANDAGES/DRESSINGS) ×3 IMPLANT
CANISTER SUCT 3000ML (MISCELLANEOUS) ×3 IMPLANT
CHLORAPREP W/TINT 26ML (MISCELLANEOUS) ×6 IMPLANT
COUNTER SPONGE BAG EZ (MISCELLANEOUS) ×1
DRSG TELFA 3X8 NADH (GAUZE/BANDAGES/DRESSINGS) ×3 IMPLANT
GAUZE SPONGE 4X4 12PLY STRL (GAUZE/BANDAGES/DRESSINGS) ×3 IMPLANT
GLOVE BIO SURGEON STRL SZ 6 (GLOVE) ×9 IMPLANT
GLOVE BIOGEL PI IND STRL 6.5 (GLOVE) ×3 IMPLANT
GLOVE BIOGEL PI INDICATOR 6.5 (GLOVE) ×6
GOWN STRL REUS W/ TWL LRG LVL3 (GOWN DISPOSABLE) ×3 IMPLANT
GOWN STRL REUS W/TWL LRG LVL3 (GOWN DISPOSABLE) ×6
KIT RM TURNOVER STRD PROC AR (KITS) ×3 IMPLANT
MARKER SKIN W/RULER 31145785 (MISCELLANEOUS) ×3 IMPLANT
NS IRRIG 1000ML POUR BTL (IV SOLUTION) ×3 IMPLANT
PACK C SECTION AR (MISCELLANEOUS) ×3 IMPLANT
PAD GROUND ADULT SPLIT (MISCELLANEOUS) ×3 IMPLANT
PAD OB MATERNITY 4.3X12.25 (PERSONAL CARE ITEMS) ×3 IMPLANT
PAD PREP 24X41 OB/GYN DISP (PERSONAL CARE ITEMS) ×3 IMPLANT
PENCIL ELECTRO HAND CTR (MISCELLANEOUS) ×3 IMPLANT
SUT CHROMIC 0 CT 1 (SUTURE) IMPLANT
SUT MNCRL AB 4-0 PS2 18 (SUTURE) IMPLANT
SUT PLAIN 2 0 XLH (SUTURE) IMPLANT
SUT VIC AB 0 CT1 36 (SUTURE) ×9 IMPLANT
SUT VIC AB 1 CT1 36 (SUTURE) ×6 IMPLANT
SUT VIC AB 2-0 CT1 27 (SUTURE) ×2
SUT VIC AB 2-0 CT1 TAPERPNT 27 (SUTURE) ×1 IMPLANT
SUT VIC AB 3-0 SH 27 (SUTURE) ×2
SUT VIC AB 3-0 SH 27X BRD (SUTURE) ×1 IMPLANT

## 2015-05-18 NOTE — Progress Notes (Signed)
  Antenatal Progress Note  Subjective:     Patient ID: [name] is a 34 y.o. female 5495w4d, Estimated Date of Delivery: 07/23/15 by 2nd trimester sono  who was admitted for preterm contractions, oligohydramnios with questionable PPROM.  Also with positive drug use this admission. HD# 2.   Subjective:  Patient continues to complain of pelvic pain. Tolerating diet.    Review of Systems Unsure if contractions, denies further leakage of fluids, vaginal bleeding, and reports good fetal movement.     Objective:   Filed Vitals:   05/18/15 [id number] 05/18/15 0933 05/18/15 1033 05/18/15 1224  BP: 122/65 122/70 103/77   Pulse: 85 89 87   Temp:    98 F (36.7 C)  TempSrc:    Oral  Resp:      Weight:      SpO2:        General appearance: alert and moderate distress.  Patient noted on toilet stating that she feels lots of pressure and needs to have a BM.  Lungs: deferred Heart: deferred Abdomen: soft, tender to deep palpation in lower abdomen.  Pelvic: external genitalia normal and cervix 1/thick/-1/c Extremities: extremities normal, atraumatic, no cyanosis or edema   FHT: baseline 135 bpm, accels present/absent, spontaneous deceleration present at ~ 1:04 pm, lasting 2 min with return to baseline.  Otherwise reassuring strip.  Variability: moderate Toco: no contractions   LABS:  Lab Results  Component Value Date   WBC 21.0* 05/18/2015   HGB 7.7* 05/18/2015   HCT 23.3* 05/18/2015   MCV 97.0 05/18/2015   PLT 303 05/18/2015   Lab Results  Component Value Date   NA 130* 05/17/2015   K 4.2 05/17/2015   CL 99* 05/17/2015   CO2 19* 05/17/2015   Lab Results  Component Value Date   CREATININE 0.88 05/17/2015   Lab Results  Component Value Date   ABORH A NEG 05/17/2015   HIV-/RPR-  Assessment:  34 y.o. female 3595w4d, Estimated Date of Delivery: 07/23/15 with:    Patient Active Problem List   Diagnosis Date Noted  . PPROM? 05/18/2015  . Preterm uterine contractions  05/17/2015  . Oligohydramnios antepartum 05/17/2015  . H/O cesarean section complicating pregnancy 05/17/2015  . Anemia 05/17/2015  . AA (alcohol abuse) 12/08/2014  . Polysubstanceabuse during pregnancy 12/08/2014  . Acute deep vein thrombosis of arm 08/19/2014  . HCV (hepatitis C virus) - in remission 07/17/2013  . H/O cesarean section - classical 05/18/2013  . Herpes infection in pregnancy 05/18/2013     Plan:   Continue prophylactic antibiotics for suspected PPROM.  Patient will need Acyclovir for h/o HSV (although no recent or current outbreaks) MFM consult.  Pain control - will attempt to control with PO pain meds but patient currently noting no relief. Discussed need to limit IV pain meds in case of need for immediate delivery and concerns for further respiratory compromise.  Will need social services consult this admission.   Will likely need consult with Hematologist as well for h/o DVT in the past.  Currently on SCDs   Severe anemia present - will initiate on ferrous sulfate Still pending certain admission labs (STD screen).     Hildred LaserAnika Benard Minturn, MD [facility]

## 2015-05-18 NOTE — Progress Notes (Signed)
Unable to do full assessment due to emergency nature of procedure

## 2015-05-18 NOTE — Progress Notes (Signed)
Subjective: Postpartum Day 0: Cesarean Delivery  HPI: Patient denies major complaints.  Does note some dizziness and ear ringing.  Objective: Vital signs in last 24 hours: Temp:  [97.7 F (36.5 C)-98.8 F (37.1 C)] 97.7 F (36.5 C) (07/05 1903) Pulse Rate:  [67-101] 78 (07/05 1903) Resp:  [18-84] 84 (07/05 1823) BP: (103-165)/(51-89) 148/79 mmHg (07/05 1903) SpO2:  [98 %-100 %] 100 % (07/05 1903) Weight:  [140 lb (63.504 kg)] 140 lb (63.504 kg) (07/05 1903)  Physical Exam:  General: alert and no distress Lochia: appropriate Uterine Fundus: firm Incision: bandage clean, dry, intact DVT Evaluation: SCDs in place.    Recent Labs CBC Latest Ref Rng 05/18/2015 05/18/2015 05/17/2015  WBC 3.6 - 11.0 K/uL 26.8(H) 21.0(H) 27.9(H)  Hemoglobin 12.0 - 16.0 g/dL 5.9(L) 7.7(L) 9.1(L)  Hematocrit 35.0 - 47.0 % 17.9(L) 23.3(L) 27.7(L)  Platelets 150 - 440 K/uL 262 303 313     Assessment/Plan: Status post Cesarean section for placental abruption, with intraoperative findings of uterine rupture.  Postoperative course complicated by severe anemia secondary to blood loss.   Discussion had with patient regarding surgical findings and current hemodynamic status. Recommend blood transfusion.  Will transfuse 2 units to start.  Patient ok with plan.  Consent signed. Continue current pain management with Dilaudid PCA.  Continue routine postpartum care.   Hildred Lasernika Erland Vivas 05/18/2015, 10:37 PM

## 2015-05-18 NOTE — Telephone Encounter (Signed)
-----   Message from Anika Cherry, MD sent at 05/15/2015  2:23 PM EDT ----- Please inform patient of normal glucola.  Also with iron deficiency anemia in pregnancy, needs to begin iron (ferrous sulfate) 325 mg BID. 

## 2015-05-18 NOTE — Anesthesia Preprocedure Evaluation (Signed)
Anesthesia Evaluation    Airway Mallampati: II       Dental  (+) Teeth Intact   Pulmonary Current Smoker,  breath sounds clear to auscultation        Cardiovascular Rhythm:Regular Rate:Normal     Neuro/Psych    GI/Hepatic (+) Hepatitis -  Endo/Other    Renal/GU      Musculoskeletal   Abdominal   Peds  Hematology   Anesthesia Other Findings   Reproductive/Obstetrics                             Anesthesia Physical Anesthesia Plan  ASA: III and emergent  Anesthesia Plan: General   Post-op Pain Management:    Induction: Intravenous  Airway Management Planned: Oral ETT  Additional Equipment:   Intra-op Plan:   Post-operative Plan:   Informed Consent:   Plan Discussed with: Anesthesiologist and CRNA  Anesthesia Plan Comments:         Anesthesia Quick Evaluation

## 2015-05-18 NOTE — Op Note (Signed)
Cesarean Section Procedure Note  Indications: abruptio placenta, non-reassuring fetal status and previous uterine incision classical  Pre-operative Diagnosis: 30 week 4 day pregnancy, prior classical C-section, oligohyramnios (possibly due to questionable PPROM), polysubstance abuse, h/o preterm contractions, repetitive decelerations, placental abruption.  Post-operative Diagnosis: same, with uterine dehiscence  Surgeon: Hildred Laser, MD  Assistants:  None  Anesthesia: General endotracheal anesthesia  ASA Class: 3   Procedure Details   The patient was seen in the Labor Room. The risks, benefits, complications, treatment options, and expected outcomes were discussed with the patient.  The patient concurred with the proposed plan, giving informed consent.  The site of surgery properly noted. The patient was taken to Operating Room, identified as [name] and the procedure verified as C-Section Delivery. A Time Out was not performed due to the stat nature of the procedure.  The patient's abdomen was prepped in the usual sterile manner.  After induction of anesthesia, a Pfannenstiel incision was made and carried down through the subcutaneous tissue to the fascia. Fascial incision was made and extended transversely. The rectus muscles were separated in the midline.  Ther peritoneum was identified, bluntly entered, and extended longitudinally.  A large hemoperitoneum was noted.  The uterus was noted to have dehiscence.   Delivered from cephalic presentation was a 1240 gram Female with Apgar scores of 4 at one minute, 6 at five minutes, and 8 at 10 minutes. After the umbilical cord was clamped and cut cord blood and gas were obtained for evaluation. The placenta was removed intact and appeared normal except for large blood clot noted to cover ~ 50% of maternal surface. The uterus was exteriorized. The uterine outline, tubes and ovaries appeared normal. There was noted to be a complete  separation of prior classical scar.  The bladder was noted to be unaffected as the vesicouterine reflection was noted to be intact. The uterine incision was closed with running locked sutures of 0-Vicryl in 2 layers. A third layer was performed with 2-0 Vicryl for added hemostasis. Hemostasis was observed. Lavage was carried out until clear. The uterus was then returned to the abdomen. Interceed was placed over the incision. The fascia was then reapproximated with running sutures of 0-Vicryl. The skin was reapproximated with Staples.  Instrument, sponge, and needle counts were not performed prior to start of procedure due to stat nature. A count was performed at the conclusion of the case.  An abdominal 1-view X-ray was performed, with no retained instruments or sponges.   Findings: Large hemoperitoneum, ~ 1000 ml in blood and clots Delivered from cephalic presentation was a 1240 gram Female with Apgar scores of 4 at one minute, 6 at five minutes, and 8 at 10 minutes.  The uterine outline, tubes and ovaries appeared normal.  There was noted to be a complete separation of prior classical scar.  The bladder was noted to be unaffected. Abdominal X-ray with no retained instruments or sponges  Estimated Blood Loss:  400 ml in surgical blood loss, 1000 ml of hemoperitoneum.          Drains: foley catheter to gravity, 50 ml at end of procedure         Total IV Fluids:  1000 ml         Specimens: Placenta, Cord blood  and Disposition:  Sent to Pathology     Implants: None         Complications:  None; patient tolerated the procedure well.  Disposition: PACU in stable condition.         Condition: stable   Hildred LaserAnika Tiffiany Beadles, MD [facility]

## 2015-05-18 NOTE — Transfer of Care (Signed)
  Immediate Anesthesia Transfer of Care Note  Patient: [name]  Procedure(s) Performed: Procedure(s): CESAREAN SECTION (N/A)  Patient Location: LD 1  Anesthesia Type:General  Level of Consciousness: sedated  Airway & Oxygen Therapy: Patient Spontanous Breathing and Patient connected to face mask oxygen  Post-op Assessment: Report given to RN and Post -op Vital signs reviewed and stable  Post vital signs: Reviewed and stable  Last Vitals:  Filed Vitals:   05/18/15 1333  BP: 131/58  Pulse: 72  Temp:   Resp:     Complications: No apparent anesthesia complications

## 2015-05-18 NOTE — Progress Notes (Signed)
OB Progress Note Update   Called by nurse who notes that patient's pain has intensified and that her abdomen would not return to resting tone.  Returned to assess patient.  Bedside sono now notes placental separation with large retroplacental clot (10.9 mm).  Fetal tracing with several repetitive decelerations.   Patient now diagnosed with abruption. Will proceed with stat C-section.  Anesthesia notified.   Hildred LaserAnika Gurjit Loconte, MD [facility] 05/18/2015 2:09 PM

## 2015-05-18 NOTE — Anesthesia Procedure Notes (Signed)
Procedure Name: Intubation Date/Time: 05/18/2015 2:15 PM Performed by: Stormy FabianURTIS, Odessie Polzin Pre-anesthesia Checklist: Patient identified, Patient being monitored, Timeout performed, Emergency Drugs available and Suction available Patient Re-evaluated:Patient Re-evaluated prior to inductionOxygen Delivery Method: Circle system utilized Preoxygenation: Pre-oxygenation with 100% oxygen Intubation Type: IV induction, Cricoid Pressure applied and Rapid sequence Ventilation: Mask ventilation without difficulty Laryngoscope Size: Mac and 3 Grade View: Grade I Tube type: Oral Tube size: 7.0 mm Number of attempts: 1 Airway Equipment and Method: Stylet Placement Confirmation: ETT inserted through vocal cords under direct vision,  positive ETCO2 and breath sounds checked- equal and bilateral Secured at: 21 cm Tube secured with: Tape Dental Injury: Teeth and Oropharynx as per pre-operative assessment

## 2015-05-19 LAB — HEPATITIS C ANTIBODY

## 2015-05-19 LAB — CBC
HEMATOCRIT: 19.4 % — AB (ref 35.0–47.0)
HEMOGLOBIN: 6.7 g/dL — AB (ref 12.0–16.0)
MCH: 31.7 pg (ref 26.0–34.0)
MCHC: 34.3 g/dL (ref 32.0–36.0)
MCV: 92.3 fL (ref 80.0–100.0)
PLATELETS: 196 10*3/uL (ref 150–440)
RBC: 2.11 MIL/uL — AB (ref 3.80–5.20)
RDW: 14 % (ref 11.5–14.5)
WBC: 18.2 10*3/uL — AB (ref 3.6–11.0)

## 2015-05-19 LAB — HCV RNA QUANT: HCV QUANT: NOT DETECTED [IU]/mL (ref >50)

## 2015-05-19 LAB — MEASLES/MUMPS/RUBELLA IMMUNITY
Mumps IgG: 206 AU/mL (ref >10.9)
Rubella: 2.02 index (ref >0.99)
Rubeola IgG: <25 AU/mL — ABNORMAL LOW (ref >29.9)

## 2015-05-19 LAB — PREPARE RBC (CROSSMATCH)

## 2015-05-19 LAB — HEMOGLOBIN AND HEMATOCRIT, BLOOD
HEMATOCRIT: 20.5 % — AB (ref 35.0–47.0)
Hemoglobin: 7 g/dL — ABNORMAL LOW (ref 12.0–16.0)

## 2015-05-19 LAB — VARICELLA ZOSTER ANTIBODY, IGG: Varicella IgG: 1115 index (ref >165)

## 2015-05-19 LAB — RPR: RPR: NONREACTIVE

## 2015-05-19 LAB — RUBELLA SCREEN: Rubella: 1.96 index (ref >0.99)

## 2015-05-19 MED ORDER — DIPHENHYDRAMINE HCL 25 MG PO CAPS
25.0000 mg | ORAL_CAPSULE | Freq: Once | ORAL | Status: AC
  Administered 2015-05-19: 25 mg via ORAL
  Filled 2015-05-19: qty 1

## 2015-05-19 MED ORDER — IBUPROFEN 600 MG PO TABS
600.0000 mg | ORAL_TABLET | Freq: Four times a day (QID) | ORAL | Status: DC
  Administered 2015-05-19 (×2): 600 mg via ORAL
  Filled 2015-05-19 (×2): qty 1

## 2015-05-19 MED ORDER — SODIUM CHLORIDE 0.9 % IV SOLN
Freq: Once | INTRAVENOUS | Status: AC
  Administered 2015-05-19: 15:00:00 via INTRAVENOUS

## 2015-05-19 MED ORDER — ACETAMINOPHEN 325 MG PO TABS
650.0000 mg | ORAL_TABLET | Freq: Once | ORAL | Status: AC
  Administered 2015-05-19: 650 mg via ORAL
  Filled 2015-05-19: qty 2

## 2015-05-19 NOTE — Anesthesia Postprocedure Evaluation (Signed)
  Anesthesia Post-op Note  Patient: [name]  Procedure(s) Performed: Procedure(s): CESAREAN SECTION (N/A)  Anesthesia type:General  Patient location: 337  Post pain: Pain level controlled  Post assessment: Post-op Vital signs reviewed, Patient's Cardiovascular Status Stable, Respiratory Function Stable, Patent Airway and No signs of Nausea or vomiting  Post vital signs: Reviewed and stable  Last Vitals:  Filed Vitals:   05/19/15 0807  BP: 121/61  Pulse: 79  Temp: 36.9 C  Resp:     Level of consciousness: awake, alert  and patient cooperative  Complications: No apparent anesthesia complications

## 2015-05-19 NOTE — Clinical Social Work Note (Signed)
CSW assessment for patient provided in documentation under patient's infant's chart. York SpanielMonica Jemmie Rhinehart MSW,LCSWA [phone]

## 2015-05-19 NOTE — Progress Notes (Addendum)
Subjective: Postpartum Day 1: Cesarean Delivery Patient reports incisional pain, mild.  Has ambulated to see infant.  Received blood transfusion of 2 units PRBCs overnight.  No adverse reactions.    Objective: Vital signs in last 24 hours: Temp:  [98.1 F (36.7 C)-99 F (37.2 C)] 98.2 F (36.8 C) (07/06 1723) Pulse Rate:  [67-87] 67 (07/06 1723) Resp:  [16-20] 18 (07/06 1723) BP: (101-139)/(45-74) 115/54 mmHg (07/06 1723) SpO2:  [97 %-100 %] 100 % (07/06 1723)  Physical Exam:  General: alert and no distress Lochia: appropriate Pelvis: Uterine Fundus: firm.  Foley catheter in place. Incision: bandage intact, clean, dry, intact.  DVT Evaluation: No evidence of DVT seen on physical exam. No cords or calf tenderness. No significant calf/ankle edema.   Labs:   A-/+ (s/p Rhogam 05/13/15)HIV-/RI/VZI/HCV Ab+/HCV RNA pending/HBV-/GC-/Cl-/GBS-  CBC Latest Ref Rng 05/19/2015 05/18/2015 05/18/2015  WBC 3.6 - 11.0 K/uL 18.2(H) 26.8(H) 21.0(H)  Hemoglobin 12.0 - 16.0 g/dL 6.7(L) 5.9(L) 7.7(L)  Hematocrit 35.0 - 47.0 % 19.4(L) 17.9(L) 23.3(L)  Platelets 150 - 440 K/uL 196 262 303     Assessment Status post stat Cesarean section at [redacted] weeks gestation for abruption with uterine rupture.  polysubstane abuse.  h/o arm DVT in 2014.  h/o Hep C infection in 2014.  Anemia   Plan: Doing well postoperatively.  Continue current care. PCA to be discontinued.  Will transition to PO meds.  Anemia - s/p 2 units PRBCs with increase only to 6.7 from 5.9.  Will transfuse 1 additional unit.    WIll remove foley after transfusion of last unit.  Patient to have social work consult.  Desires to breastfeed - will have lactation consultant come to discuss with patient.  H/o arm DVT - will have Hematologist consult to see if any prophylaxis needed postpartum. Patient without treatment intrapartum due to incomplete history.  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  Lengthy discussion had with patient regarding operative findings.  Noted that patient should if at all possible not attempt to conceive again based on prior pregnancy history, substance abuse, and uterine rupture.  Strongly recommended LARC contraception.  Notes that  She would like to have a Nexplanon placed.  Can have this placed at 6 week postpartum visit.  Will also give Depo Provera prior to discharge.     Hildred LaserAnika Kodie Pick, MD [facility]

## 2015-05-20 LAB — CULTURE, BETA STREP (GROUP B ONLY)

## 2015-05-20 LAB — HIV ANTIBODY (ROUTINE TESTING W REFLEX): HIV Screen 4th Generation wRfx: NONREACTIVE

## 2015-05-20 LAB — HEMOGLOBIN AND HEMATOCRIT, BLOOD
HEMATOCRIT: 22.5 % — AB (ref 35.0–47.0)
Hemoglobin: 7.6 g/dL — ABNORMAL LOW (ref 12.0–16.0)

## 2015-05-20 MED ORDER — ENOXAPARIN SODIUM 40 MG/0.4ML ~~LOC~~ SOLN
40.0000 mg | SUBCUTANEOUS | Status: DC
  Administered 2015-05-20 – 2015-05-21 (×2): 40 mg via SUBCUTANEOUS
  Filled 2015-05-20 (×2): qty 0.4

## 2015-05-20 MED ORDER — HYDROMORPHONE HCL 1 MG/ML IJ SOLN
2.0000 mg | Freq: Once | INTRAMUSCULAR | Status: AC
  Administered 2015-05-20: 2 mg via INTRAVENOUS
  Filled 2015-05-20: qty 2

## 2015-05-20 MED ORDER — IBUPROFEN 600 MG PO TABS
600.0000 mg | ORAL_TABLET | Freq: Four times a day (QID) | ORAL | Status: DC
  Administered 2015-05-20 (×2): 600 mg via ORAL
  Filled 2015-05-20 (×2): qty 1

## 2015-05-20 MED ORDER — IBUPROFEN 600 MG PO TABS
600.0000 mg | ORAL_TABLET | Freq: Four times a day (QID) | ORAL | Status: DC
  Administered 2015-05-20 – 2015-05-21 (×5): 600 mg via ORAL
  Filled 2015-05-20 (×5): qty 1

## 2015-05-20 NOTE — Telephone Encounter (Signed)
-----   Message from Hildred LaserAnika Cherry, MD sent at 05/15/2015  2:23 PM EDT ----- Please inform patient of normal glucola.  Also with iron deficiency anemia in pregnancy, needs to begin iron (ferrous sulfate) 325 mg BID.

## 2015-05-20 NOTE — Progress Notes (Signed)
Postpartum Day 2: Cesarean Delivery  Subjective: Patient in nursery visiting with infant.  Reported severe  incisional pain, earlier, was uncontrolled with Percocet and Motrin.  Given 1 dose IV pain meds, with resolution of pain.  Objective: Vital signs in last 24 hours: Blood pressure 177/83, pulse 53, temperature 98.9 F (37.2 C), temperature source Oral, resp. rate 18, height 5\' 4"  (1.626 m), weight 140 lb (63.504 kg), SpO2 100 %, unknown if currently breastfeeding.  Physical Exam:  General: alert and no distress Lochia: appropriate Pelvis: Uterine Fundus: unable to assess as patient holding neonate.   Incision: bandage intact, clean, dry, intact.  DVT Evaluation: No evidence of DVT seen on physical exam. No cords or calf tenderness. No significant calf/ankle edema.   Labs:   A-/+ (s/p Rhogam 05/13/15)HIV-/RI/VZI/HCV Ab+/HCV RNA pending/HBV-/GC-/Cl-/GBS-  CBC Latest Ref Rng 05/19/2015 05/19/2015 05/18/2015  WBC 3.6 - 11.0 K/uL - 18.2(H) 26.8(H)  Hemoglobin 12.0 - 16.0 g/dL 7.0(L) 6.7(L) 5.9(L)  Hematocrit 35.0 - 47.0 % 20.5(L) 19.4(L) 17.9(L)  Platelets 150 - 440 K/uL - 196 262     Assessment Status post stat Cesarean section at [redacted] weeks gestation for abruption with uterine rupture.  polysubstane abuse.  h/o arm DVT in 2014.  h/o Hep C infection in 2014.  Anemia   Plan: Doing well postoperatively.  Continue current care. PCA to be discontinued.  Will transition to PO meds.  Anemia - s/p 3 units PRBCs with only modest increase from 6.7 to 7.0.  Patient denies excessive vaginal bleeding. Will reassess abdomen once patient back in room.  Will also order stat CBC to assess stability.   Patient s/p social work consult.  H/o arm DVT - consulted with [facility] who does recommend initiation of DVT prophylaxis with Lovenox 40 mg daily now that patient is postpartum.  Will also give Depo Provera prior to discharge.    Hildred LaserAnika Latisia Hilaire, MD [facility]

## 2015-05-20 NOTE — Lactation Note (Signed)
This note was copied from the chart of [name]. Lactation Consultation Note  Patient Name: [name] [id number]'s Date: 05/20/2015     Maternal Data  Started Mother pumping.  Feeding Feeding Type: Donor Breast Milk  LATCH Score/Interventions                      Lactation Tools Discussed/Used     Consult Status      [name] 05/20/2015, 2:15 PM

## 2015-05-21 ENCOUNTER — Other Ambulatory Visit: Payer: Medicaid Other

## 2015-05-21 LAB — CHLAMYDIA/NGC RT PCR (ARMC ONLY)
Chlamydia Tr: NOT DETECTED
N gonorrhoeae: NOT DETECTED

## 2015-05-21 LAB — HEPARIN LEVEL (UNFRACTIONATED): Heparin Unfractionated: <0.1 IU/mL — ABNORMAL LOW (ref 0.30–0.70)

## 2015-05-21 MED ORDER — LIDOCAINE 5 % EX PTCH: 1.0000 | MEDICATED_PATCH | CUTANEOUS | Status: DC

## 2015-05-21 MED ORDER — ENOXAPARIN SODIUM 40 MG/0.4ML ~~LOC~~ SOLN: 40.0000 mg | SUBCUTANEOUS | Status: DC

## 2015-05-21 MED ORDER — MEDROXYPROGESTERONE ACETATE 150 MG/ML IM SUSP
150.0000 mg | Freq: Once | INTRAMUSCULAR | Status: AC
  Administered 2015-05-21: 150 mg via INTRAMUSCULAR
  Filled 2015-05-21: qty 1

## 2015-05-21 MED ORDER — OXYCODONE-ACETAMINOPHEN 5-325 MG PO TABS: 1.0000 | ORAL_TABLET | Freq: Four times a day (QID) | ORAL | Status: DC | PRN

## 2015-05-21 MED ORDER — IBUPROFEN 800 MG PO TABS: 800.0000 mg | ORAL_TABLET | Freq: Three times a day (TID) | ORAL | Status: DC | PRN

## 2015-05-21 MED ORDER — FERROUS SULFATE 325 (65 FE) MG PO TABS: 325.0000 mg | ORAL_TABLET | Freq: Three times a day (TID) | ORAL | Status: DC

## 2015-05-21 MED ORDER — DOCUSATE SODIUM 100 MG PO CAPS: 100.0000 mg | ORAL_CAPSULE | Freq: Two times a day (BID) | ORAL | Status: DC | PRN

## 2015-05-21 NOTE — Progress Notes (Signed)
Social worker, craig clay, [place] arrived around 1700 to meet with pt; RN called case worker from hospital, Maralyn Sagosarah (Maxine Glennmonica had already gone for the day) and updated sarah on his arrival; she will call in to the pt's room and discuss with pt and Child psychotherapistsocial worker her updates; Chiropodistassistant director amy mcdowell also notified of social worker coming to check on pt today

## 2015-05-21 NOTE — Discharge Instructions (Signed)
General Obstetric Post-Operative Instructions You may expect to feel dizzy, weak, and drowsy for as long as 24 hours after receiving the medicine that made you sleep (anesthetic).  Do not drive a car, ride a bicycle, participate in physical activities, or take public transportation until you are done taking narcotic pain medicines or as directed by your doctor.  Do not drink alcohol or take tranquilizers.  Do not take medicine that has not been prescribed by your doctor.  Do not sign important papers or make important decisions while on narcotic pain medicines.  Have a responsible person with you.  CARE OF INCISION  Keep incision clean and dry. Take showers instead of baths until your doctor gives you permission to take baths.  Avoid heavy lifting (more than 10 pounds/4.5 kilograms), pushing, or pulling.  Avoid activities that may risk injury to your surgical site.  No sexual intercourse or placement of anything in the vagina for 6 weeks or as instructed by your doctor. If you have tubes coming from the wound site, check with your doctor regarding appropriate care of the tubes. Only take prescription or over-the-counter medicines  for pain, discomfort, or fever as directed by your doctor.    Call the office or go to the Emergency Room if:  You feel sick to your stomach (nauseous).  You start to throw up (vomit).  You have trouble eating or drinking.  You have an oral temperature above 101.  You have constipation that is not helped by adjusting diet or increasing fluid intake. Pain medicines are a common cause of constipation.  You have any other concerns. SEEK IMMEDIATE MEDICAL CARE IF:  You have persistent dizziness.  You have difficulty breathing or a congested sounding (croupy) cough.  You have an oral temperature above 102.5, not controlled by medicine.  There is increasing pain or tenderness near or in the surgical site.

## 2015-05-21 NOTE — Lactation Note (Signed)
This note was copied from the chart of Samantha Phineas Semeniffany Preast. Lactation Consultation Note  Patient Name: [name] [id number]'JToday's Date: 05/21/2015    Maternal Data  Mom wants to continue to pump breasts after d/c this pm, has only pumped once today, [employer] has no available symphony pump, pt has use of medela pump and style borrowed that she will use at home and will use our Symphony pump here, she was encouraged to pump every 3 hrs.  Feeding    LATCH Score/Interventions                      Lactation Tools Discussed/Used     Consult Status      Dyann KiefMarsha D Simi Briel 05/21/2015, 6:53 PM

## 2015-05-21 NOTE — Progress Notes (Signed)
Discharge teaching completed with pt. Pt discharged home.

## 2015-05-21 NOTE — Progress Notes (Signed)
Postpartum Day 2: Cesarean Delivery  Subjective: Patient doing well. Denies complaints. Tolerating diet, ambulating, passing flatus.   Objective: Vital signs in last 24 hours: Blood pressure 102/60, pulse 78, temperature 98.8 F (37.1 C), temperature source Oral, resp. rate 16, height 5\' 4"  (1.626 m), weight 140 lb (63.504 kg), SpO2 98 %, unknown if currently breastfeeding.  Physical Exam:  General: no distress and resting comfortably.  Lochia: appropriate   Incision: clean, dry, intact with staples. Abdomen appropriately tender, not distended.  DVT Evaluation: No evidence of DVT seen on physical exam. No cords or calf tenderness. No significant calf/ankle edema.   Labs:   A-/+ (s/p Rhogam 05/13/15)HIV-/RI/VZI/HCV Ab+/HBV-/GC-/Cl-/GBS-  CBC Latest Ref Rng 05/20/2015 05/19/2015 05/19/2015  WBC 3.6 - 11.0 K/uL - - 18.2(H)  Hemoglobin 12.0 - 16.0 g/dL 7.6(L) 7.0(L) 6.7(L)  Hematocrit 35.0 - 47.0 % 22.5(L) 20.5(L) 19.4(L)  Platelets 150 - 440 K/uL - - 196     Assessment Status post stat Cesarean section at [redacted] weeks gestation for abruption with uterine rupture.  polysubstane abuse.  h/o arm DVT in 2014.  h/o Hep C infection in 2014.  Anemia   Plan: Doing well postoperatively.  Can d/c home today.  Anemia - s/p 3 units PRBCs.  Stable currently at Hgb 7.6.   Patient s/p social work consult.  Has had lactation consult.  Is using breast pump.  H/o arm DVT - consulted with [facility] (Dr. Leatha GildingLivingston) who recommended initiation of DVT prophylaxis with Lovenox 40 mg daily to be continued until 4-6 weeks postpartum.  Will also give Depo Provera prior to discharge.  Remove staples and place steri-strips prior to discharge.  To f/u in 1 week for incision check.   Hildred LaserAnika Prosper Paff, MD [facility]

## 2015-05-21 NOTE — Progress Notes (Signed)
Staples removed per order and steri-strips applied. About 30mins later pt noted bleeding from the incision through her underwear. An approx. 1 cm spot was found to be unapproximated and bleeding a minimal-moderate amount. Dr. Valentino Saxonherry paged and gave orders that several 4x4s and a pressure dressing should be applied over the open area and that Dr. Valentino Saxonherry would re-assess at the incision check office visit. Pt instructed to go to the ED if there was bleeding through or around the pressure dressing, increased pain, or a "ripping sensation".

## 2015-05-21 NOTE — Discharge Summary (Signed)
Obstetric Discharge Summary Reason for Admission: observation/evaluation, rupture of membranes and preterm contractions Prenatal Procedures: NST and ultrasound Intrapartum Procedures: antibiotics for suspected PPROM, stat cesarean section for placental abruption and uterine rupture) Postpartum Procedures: transfusion 3 units PRBCs Complications-Operative and Postpartum: hemorrhage HEMOGLOBIN  Date Value Ref Range Status  05/20/2015 7.6* 12.0 - 16.0 g/dL Final   HGB  Date Value Ref Range Status  12/14/2014 13.4 12.0-16.0 g/dL Final   HCT  Date Value Ref Range Status  05/20/2015 22.5* 35.0 - 47.0 % Final  12/14/2014 40.1 35.0-47.0 % Final   HEMATOCRIT  Date Value Ref Range Status  05/13/2015 24.6* 34.0 - 46.6 % Final    Physical Exam:  General: alert and no distress Lochia: appropriate Uterine Fundus: firm Incision: healing well, no significant drainage, no dehiscence, no significant erythema DVT Evaluation: No evidence of DVT seen on physical exam. No cords or calf tenderness. No significant calf/ankle edema.  Discharge Diagnoses: Preterm pregnancy - delivered, placental abruption, uterine rupture, polysubstance abuse, hemorrhage, h/o DVT, anemia (due to pregnancy and complicated by surgical blood loss)  Discharge Information: Date: 05/21/2015 Activity: pelvic rest x 6 weeks. No heavy lifting more than 15 lbs.  Diet: routine Medications: PNV, Ibuprofen, Colace, Iron, Percocet and Lovenox Condition: stable Instructions: refer to practice specific booklet Discharge to: home   Newborn Data: Live born female  Birth Weight: 2 lb 11.7 oz (1240 g) APGAR: 4, 6  Home with mother.  [name] 05/21/2015, 1:29 PM

## 2015-05-22 NOTE — Clinical Social Work Note (Signed)
LATE ENTRY  Clinical Social Worker was called on Friday, 05/21/2015 at 5:39 PM by RN that DSS worker is on unit and requested medical records. CSW was transferred into room and spoke to pt, who gave permission to speak with DSS Worker. CSW spoke to [place] [phone]) with [facility] [place] [place] [facility] C[facility]. Current open case regarding pt's newborn. CSW will follow up with CPS worker on Monday, 05/24/2015 regarding needed information. Pt was discharged on 05/21/2015.   Dede QuerySarah Shalandria Elsbernd, MSW, LCSW Clinical Social Worker  [phone]

## 2015-05-25 ENCOUNTER — Encounter: Payer: Medicaid Other | Admitting: Obstetrics and Gynecology

## 2015-05-25 LAB — TYPE AND SCREEN
ABO/RH(D): A NEG
ANTIBODY SCREEN: POSITIVE
Unit division: 0
Unit division: 0
Unit division: 0

## 2015-05-27 ENCOUNTER — Ambulatory Visit: Payer: Self-pay

## 2015-05-27 NOTE — Lactation Note (Signed)
This note was copied from the chart of [name]. Lactation Consultation Note  Patient Name: Samantha Phineas Semeniffany Fluegel [id number]'UToday's Date: 05/27/2015     Maternal Data   Mother was cocaine + at delivery and stated she had a relapse in her sobriety. Initially she was pumping but has not been able to for a few days. Feeding Feeding Type: Donor Breast Milk Length of feed: 30 min                      Lactation Tools Discussed/Used Tools: Pump   Consult Status  As needed, as mother is no longer pumping    Trudee GripCarolyn P Vennessa Affinito 05/27/2015, 12:05 PM

## 2015-07-05 ENCOUNTER — Other Ambulatory Visit: Payer: Self-pay

## 2015-07-05 ENCOUNTER — Emergency Department: Payer: Medicaid Other

## 2015-07-05 ENCOUNTER — Emergency Department
Admission: EM | Admit: 2015-07-05 | Discharge: 2015-07-05 | Disposition: A | Discharge status: Home / Self Care | Payer: Medicaid Other | Attending: Emergency Medicine | Admitting: Emergency Medicine

## 2015-07-05 ENCOUNTER — Encounter: Payer: Self-pay | Admitting: Emergency Medicine

## 2015-07-05 DIAGNOSIS — Z880 Allergy status to penicillin: Secondary | ICD-10-CM | POA: Insufficient documentation

## 2015-07-05 DIAGNOSIS — Z91040 Latex allergy status: Secondary | ICD-10-CM | POA: Diagnosis not present

## 2015-07-05 DIAGNOSIS — J159 Unspecified bacterial pneumonia: Secondary | ICD-10-CM | POA: Insufficient documentation

## 2015-07-05 DIAGNOSIS — Z79899 Other long term (current) drug therapy: Secondary | ICD-10-CM | POA: Insufficient documentation

## 2015-07-05 DIAGNOSIS — R079 Chest pain, unspecified: Secondary | ICD-10-CM | POA: Diagnosis present

## 2015-07-05 DIAGNOSIS — Z720 Tobacco use: Secondary | ICD-10-CM | POA: Diagnosis not present

## 2015-07-05 DIAGNOSIS — J189 Pneumonia, unspecified organism: Secondary | ICD-10-CM

## 2015-07-05 HISTORY — DX: Unspecified asthma, uncomplicated: J45.909

## 2015-07-05 LAB — BASIC METABOLIC PANEL
ANION GAP: 11 (ref 5–15)
BUN: 8 mg/dL (ref 6–20)
CHLORIDE: 100 mmol/L — AB (ref 101–111)
CO2: 25 mmol/L (ref 22–32)
Calcium: 8.7 mg/dL — ABNORMAL LOW (ref 8.9–10.3)
Creatinine, Ser: 0.67 mg/dL (ref 0.44–1.00)
GFR calc Af Amer: >60 mL/min (ref >60)
GFR calc non Af Amer: >60 mL/min (ref >60)
GLUCOSE: 164 mg/dL — AB (ref 65–99)
POTASSIUM: 3.1 mmol/L — AB (ref 3.5–5.1)
Sodium: 136 mmol/L (ref 135–145)

## 2015-07-05 LAB — HEPATIC FUNCTION PANEL
ALT: 15 U/L (ref 14–54)
AST: 24 U/L (ref 15–41)
Albumin: 3.1 g/dL — ABNORMAL LOW (ref 3.5–5.0)
Alkaline Phosphatase: 81 U/L (ref 38–126)
Total Protein: 7.4 g/dL (ref 6.5–8.1)

## 2015-07-05 LAB — CBC
HEMATOCRIT: 27.4 % — AB (ref 35.0–47.0)
Hemoglobin: 8.9 g/dL — ABNORMAL LOW (ref 12.0–16.0)
MCH: 27.1 pg (ref 26.0–34.0)
MCHC: 32.5 g/dL (ref 32.0–36.0)
MCV: 83.3 fL (ref 80.0–100.0)
Platelets: 338 10*3/uL (ref 150–440)
RBC: 3.29 MIL/uL — ABNORMAL LOW (ref 3.80–5.20)
RDW: 15 % — AB (ref 11.5–14.5)
WBC: 13.5 10*3/uL — ABNORMAL HIGH (ref 3.6–11.0)

## 2015-07-05 LAB — FIBRIN DERIVATIVES D-DIMER (ARMC ONLY): Fibrin derivatives D-dimer (ARMC): 3195.62 — ABNORMAL HIGH (ref 0–499)

## 2015-07-05 LAB — TROPONIN I: Troponin I: <0.03 ng/mL (ref <0.031)

## 2015-07-05 LAB — BRAIN NATRIURETIC PEPTIDE: B NATRIURETIC PEPTIDE 5: 139 pg/mL — AB (ref 0.0–100.0)

## 2015-07-05 MED ORDER — LEVOFLOXACIN 750 MG PO TABS: 750.0000 mg | ORAL_TABLET | Freq: Every day | ORAL | Status: AC

## 2015-07-05 MED ORDER — KETOROLAC TROMETHAMINE 30 MG/ML IJ SOLN
30.0000 mg | Freq: Once | INTRAMUSCULAR | Status: AC
  Administered 2015-07-05: 30 mg via INTRAVENOUS
  Filled 2015-07-05: qty 1

## 2015-07-05 MED ORDER — IOHEXOL 350 MG/ML SOLN
75.0000 mL | Freq: Once | INTRAVENOUS | Status: AC | PRN
  Administered 2015-07-05: 75 mL via INTRAVENOUS

## 2015-07-05 NOTE — ED Notes (Signed)
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

## 2015-07-05 NOTE — ED Notes (Signed)
Pt presents with chest pain right sided woke her up yesterday accompanied by shortness of breath.

## 2015-07-05 NOTE — ED Provider Notes (Signed)
[facility] Emergency Department Brionne Mertz Note  ____________________________________________  Time seen: Approximately 3:26 PM  I have reviewed the triage vital signs and the nursing notes.   HISTORY  Chief Complaint Chest Pain    HPI [name] is a 34 y.o. female who reports she woke up from sleep yesterday with onset of sharp pleuritic right-sided chest pain and shortness of breath. There is no heaviness involved. She is not really sweating. But it does hurt to breathe and she is short of breath. Patient reports pain does not radiates only in the right side of the chest Pain is severe patient also has a history of a year or more ago of the DVT in the right arm. She was hospitalized for this but never got the prescription for the Lovenox filled and did not take any Lovenox after being in the hospital. Patient also delivered a baby last month by C-section. 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   Past Medical History  Diagnosis Date  . HCV (hepatitis C virus) 07/17/2013    Last Assessment & Plan:  - positive antibody most recently 08/2014 - hep C RNA 08/2014 was negative - prior to that, HCV RNA [id number] in 06/2013. No record for genotype or imaging.   - patient counseled that she could become re-infected with continued IVDU or unprotected sexual encounters.   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   Last Assessment & Plan:  Discussed routine use of Valtrex prophylaxis at 36 weeks. She will be delivered by cesarean at 36-37 weeks.   . Infection with methicillin-resistant Staphylococcus aureus 03/19/2013    Overview: Arm abscess with MRSA, treated.  Subsequent screening negative  . Tenosynovitis 08/19/2014  . Acute deep vein thrombosis of arm 08/19/2014    Last Assessment & Plan:  - diagnosed 08/2014 in hospital - s/p heparin gtt - patient never took her xarelto x 3 month course  that was planned - no symptoms today.  No further treatment - we discussed avoiding IVDU (see below) to reduce her chances of provoked upper extremity DVT   . Asthma     Patient Active Problem List   Diagnosis Date Noted  . Oligohydramnios 05/18/2015  . S/P cesarean section 05/18/2015  . Preterm uterine contractions 05/17/2015  . Oligohydramnios antepartum 05/17/2015  . H/O cesarean section complicating pregnancy 05/17/2015  . Preterm contractions 05/17/2015  . AA (alcohol abuse) 12/08/2014  . Drug abuse during pregnancy 12/08/2014  . High risk sexual behavior 12/08/2014  . Tenosynovitis 08/19/2014  . Acute deep vein thrombosis of arm 08/19/2014  . HCV (hepatitis C virus) 07/17/2013  . H/O cesarean section 05/18/2013  . Herpes infection in pregnancy 05/18/2013  . Infection with methicillin-resistant Staphylococcus aureus 03/19/2013  . Cannabis abuse 03/18/2013  . Polysubstance abuse 03/18/2013  . Compulsive tobacco user syndrome 03/17/2013    Past Surgical History  Procedure Laterality Date  . Cesarean section    . Neck surgery      Fusion    Current Outpatient Rx  Name  Route  Sig  Dispense  Refill  . docusate sodium (COLACE) 100 MG capsule   Oral   Take 1 capsule (100 mg total) by mouth 2 (two) times daily as needed.   30 capsule   2   . enoxaparin (LOVENOX) 40 MG/0.4ML injection   Subcutaneous   Inject 0.4 mLs (40 mg total) into the skin daily.  30 Syringe   1   . ferrous sulfate 325 (65 FE) MG tablet   Oral   Take 1 tablet (325 mg total) by mouth 3 (three) times daily with meals.   90 tablet   3   . ibuprofen (ADVIL,MOTRIN) 800 MG tablet   Oral   Take 1 tablet (800 mg total) by mouth every 8 (eight) hours as needed.   60 tablet   1   . levofloxacin (LEVAQUIN) 750 MG tablet   Oral   Take 1 tablet (750 mg total) by mouth daily.   7 tablet   0   . lidocaine (LIDODERM) 5 %   Transdermal   Place 1 patch onto the skin daily. Remove & Discard patch  within 12 hours or as directed by MD   3 patch   0   . oxyCODONE-acetaminophen (PERCOCET/ROXICET) 5-325 MG per tablet   Oral   Take 1-2 tablets by mouth every 6 (six) hours as needed for severe pain (for pain scale greater than 7).   30 tablet   0   . Prenatal Vit-Fe Fumarate-FA (MULTIVITAMIN-PRENATAL) 27-0.8 MG TABS tablet   Oral   Take 1 tablet by mouth daily at 12 noon.           Allergies Amoxicillin; Latex; and Vancomycin  Family History  Problem Relation Age of Onset  . Diabetes Mother   . COPD Mother   . Hypertension Mother   . Lung cancer Mother   . Alcohol abuse Father   . Lung cancer Maternal Grandmother     Social History Social History  Substance Use Topics  . Smoking status: Current Every Day Smoker -- 1.00 packs/day for 7 years    Types: Cigarettes  . Smokeless tobacco: Never Used  . Alcohol Use: No    Review of Systems Constitutional: Patient reports fever fever/chills Eyes: No visual changes. ENT: No sore throat. Cardiovascular:  chest pain. Respiratory:  shortness of breath. Gastrointestinal: No abdominal pain.  No nausea, no vomiting.  No diarrhea.  No constipation. Genitourinary: Negative for dysuria. Musculoskeletal: Negative for back pain. Skin: Negative for rash. Neurological: Negative for headaches, focal weakness or numbness.  10-point ROS otherwise negative.  ____________________________________________   PHYSICAL EXAM:  VITAL SIGNS: ED Triage Vitals  Enc Vitals Group     BP 07/05/15 1356 133/94 mmHg     Pulse Rate 07/05/15 1356 83     Resp 07/05/15 1356 20     Temp 07/05/15 1356 99.5 F (37.5 C)     Temp Source 07/05/15 1356 Oral     SpO2 07/05/15 1356 100 %     Weight 07/05/15 1356 130 lb (58.968 kg)     Height 07/05/15 1356 5\' 7"  (1.702 m)     Head Cir --      Peak Flow --      Pain Score 07/05/15 1357 8     Pain Loc --      Pain Edu? --      Excl. in GC? --     Constitutional: Alert and oriented. In some  distress Eyes: Conjunctivae are normal. PERRL. EOMI. Head: Atraumatic. Nose: No congestion/rhinnorhea. Mouth/Throat: Mucous membranes are moist.  Oropharynx non-erythematous. Neck: No stridor. Cardiovascular: Normal rate, regular rhythm. Grossly normal heart sounds.  Good peripheral circulation. Respiratory: Increased respiratory effort.  No retractions. Lungs CTAB. Gastrointestinal: Soft and nontender. No distention. No abdominal bruits. No CVA tenderness. Musculoskeletal: No lower extremity tenderness nor edema.  No joint effusions. No swelling  edema in the upper extremities either there is a area that read swollen on the right arm just below the elbow appears to be an area of localized cellulitis Neurologic:  Normal speech and language. No gross focal neurologic deficits are appreciated. No gait instability. Psychiatric: Mood and affect are normal. Speech and behavior are normal.  ____________________________________________   LABS (all labs ordered are listed, but only abnormal results are displayed)  Labs Reviewed  BASIC METABOLIC PANEL - Abnormal; Notable for the following:    Potassium 3.1 (*)    Chloride 100 (*)    Glucose, Bld 164 (*)    Calcium 8.7 (*)    All other components within normal limits  CBC - Abnormal; Notable for the following:    WBC 13.5 (*)    RBC 3.29 (*)    Hemoglobin 8.9 (*)    HCT 27.4 (*)    RDW 15.0 (*)    All other components within normal limits  BRAIN NATRIURETIC PEPTIDE - Abnormal; Notable for the following:    B Natriuretic Peptide 139.0 (*)    All other components within normal limits  FIBRIN DERIVATIVES D-DIMER ([facility] ONLY) - Abnormal; Notable for the following:    Fibrin derivatives D-dimer [facility]) 3195.62 (*)    All other components within normal limits  HEPATIC FUNCTION PANEL - Abnormal; Notable for the following:    Albumin 3.1 (*)    Total Bilirubin <0.1 (*)    Bilirubin, Direct <0.1 (*)    All other components within normal limits   CULTURE, BLOOD (ROUTINE X 2)  CULTURE, BLOOD (ROUTINE X 2)  TROPONIN I   ____________________________________________  EKG  EKG 1 read and interpreted by me shows normal sinus rhythm rate of 85 normal axis computer reads long QT and nonspecific ST-T wave changes EKG #2 read and interpreted by me shows normal sinus rhythm at a rate of 85 normal axis computer reading prolonged QTsignificant change from the first one ____________________________________________  RADIOLOGY  Chest x-ray read as no acute disease Etiologies calls with CT scan results. There are is no PE seen. He was a small pleural based density right knee area the patient's pain. This appears to be inflammation or infection. Patient is not hypoxic has improved dramatically with the Toradol IV. I will treat her with by mouth Toradol for a few days and the Zithromax she will be instructed to return if she is worse or no better in 2-3 days. She also will be given some clinics to try to follow-up with. ____________________________________________   PROCEDURES    ____________________________________________   INITIAL IMPRESSION / ASSESSMENT AND PLAN / ED COURSE  Pertinent labs & imaging results that were available during my care of the patient were reviewed by me and considered in my medical decision making (see chart for details).   ____________________________________________   FINAL CLINICAL IMPRESSION(S) / ED DIAGNOSES  Final diagnoses:  Community acquired pneumonia      Arnaldo Natal, MD 07/06/15 0028

## 2015-07-05 NOTE — ED Notes (Signed)
MD at bedside. 

## 2015-07-05 NOTE — ED Notes (Signed)
MD at bedside., Dr.MaLinda 

## 2015-07-07 ENCOUNTER — Encounter: Payer: Self-pay | Admitting: Obstetrics and Gynecology

## 2015-07-10 ENCOUNTER — Encounter: Payer: Self-pay | Admitting: *Deleted

## 2015-07-10 ENCOUNTER — Inpatient Hospital Stay
Admit: 2015-07-10 | Discharge: 2015-07-10 | Disposition: A | Discharge status: Home / Self Care | Payer: Medicaid Other | Attending: Internal Medicine | Admitting: Internal Medicine

## 2015-07-10 ENCOUNTER — Inpatient Hospital Stay: Payer: Medicaid Other

## 2015-07-10 ENCOUNTER — Emergency Department: Payer: Medicaid Other

## 2015-07-10 ENCOUNTER — Inpatient Hospital Stay
Admission: EM | Admit: 2015-07-10 | Discharge: 2015-07-12 | DRG: 871 | Discharge status: Against Medical Advice | Payer: Medicaid Other | Attending: Internal Medicine | Admitting: Internal Medicine

## 2015-07-10 DIAGNOSIS — Z8619 Personal history of other infectious and parasitic diseases: Secondary | ICD-10-CM

## 2015-07-10 DIAGNOSIS — F17210 Nicotine dependence, cigarettes, uncomplicated: Secondary | ICD-10-CM | POA: Diagnosis present

## 2015-07-10 DIAGNOSIS — Z833 Family history of diabetes mellitus: Secondary | ICD-10-CM

## 2015-07-10 DIAGNOSIS — L0390 Cellulitis, unspecified: Secondary | ICD-10-CM | POA: Diagnosis present

## 2015-07-10 DIAGNOSIS — Z825 Family history of asthma and other chronic lower respiratory diseases: Secondary | ICD-10-CM | POA: Diagnosis not present

## 2015-07-10 DIAGNOSIS — B009 Herpesviral infection, unspecified: Secondary | ICD-10-CM | POA: Diagnosis present

## 2015-07-10 DIAGNOSIS — E876 Hypokalemia: Secondary | ICD-10-CM | POA: Diagnosis present

## 2015-07-10 DIAGNOSIS — R609 Edema, unspecified: Secondary | ICD-10-CM | POA: Diagnosis present

## 2015-07-10 DIAGNOSIS — Z9119 Patient's noncompliance with other medical treatment and regimen: Secondary | ICD-10-CM | POA: Diagnosis present

## 2015-07-10 DIAGNOSIS — Z86718 Personal history of other venous thrombosis and embolism: Secondary | ICD-10-CM

## 2015-07-10 DIAGNOSIS — Z8614 Personal history of Methicillin resistant Staphylococcus aureus infection: Secondary | ICD-10-CM | POA: Diagnosis not present

## 2015-07-10 DIAGNOSIS — B1920 Unspecified viral hepatitis C without hepatic coma: Secondary | ICD-10-CM | POA: Diagnosis present

## 2015-07-10 DIAGNOSIS — F1110 Opioid abuse, uncomplicated: Secondary | ICD-10-CM | POA: Diagnosis present

## 2015-07-10 DIAGNOSIS — J45909 Unspecified asthma, uncomplicated: Secondary | ICD-10-CM | POA: Diagnosis present

## 2015-07-10 DIAGNOSIS — D638 Anemia in other chronic diseases classified elsewhere: Secondary | ICD-10-CM | POA: Diagnosis present

## 2015-07-10 DIAGNOSIS — Z811 Family history of alcohol abuse and dependence: Secondary | ICD-10-CM

## 2015-07-10 DIAGNOSIS — Z880 Allergy status to penicillin: Secondary | ICD-10-CM

## 2015-07-10 DIAGNOSIS — Z801 Family history of malignant neoplasm of trachea, bronchus and lung: Secondary | ICD-10-CM | POA: Diagnosis not present

## 2015-07-10 DIAGNOSIS — Z8249 Family history of ischemic heart disease and other diseases of the circulatory system: Secondary | ICD-10-CM

## 2015-07-10 DIAGNOSIS — Z881 Allergy status to other antibiotic agents status: Secondary | ICD-10-CM

## 2015-07-10 DIAGNOSIS — Z981 Arthrodesis status: Secondary | ICD-10-CM

## 2015-07-10 DIAGNOSIS — D649 Anemia, unspecified: Secondary | ICD-10-CM | POA: Diagnosis present

## 2015-07-10 DIAGNOSIS — J189 Pneumonia, unspecified organism: Secondary | ICD-10-CM | POA: Diagnosis present

## 2015-07-10 DIAGNOSIS — A419 Sepsis, unspecified organism: Secondary | ICD-10-CM | POA: Diagnosis present

## 2015-07-10 DIAGNOSIS — J90 Pleural effusion, not elsewhere classified: Secondary | ICD-10-CM | POA: Diagnosis present

## 2015-07-10 DIAGNOSIS — Z91040 Latex allergy status: Secondary | ICD-10-CM | POA: Diagnosis not present

## 2015-07-10 HISTORY — DX: Other psychoactive substance abuse, uncomplicated: F19.10

## 2015-07-10 LAB — CULTURE, BLOOD (ROUTINE X 2)
CULTURE: NO GROWTH
Culture: NO GROWTH

## 2015-07-10 LAB — BASIC METABOLIC PANEL
Anion gap: 11 (ref 5–15)
BUN: 11 mg/dL (ref 6–20)
CHLORIDE: 97 mmol/L — AB (ref 101–111)
CO2: 26 mmol/L (ref 22–32)
CREATININE: 0.94 mg/dL (ref 0.44–1.00)
Calcium: 8.6 mg/dL — ABNORMAL LOW (ref 8.9–10.3)
GFR calc Af Amer: >60 mL/min (ref >60)
Glucose, Bld: 122 mg/dL — ABNORMAL HIGH (ref 65–99)
Potassium: 3 mmol/L — ABNORMAL LOW (ref 3.5–5.1)
SODIUM: 134 mmol/L — AB (ref 135–145)

## 2015-07-10 LAB — CBC
HCT: 25.5 % — ABNORMAL LOW (ref 35.0–47.0)
Hemoglobin: 8.5 g/dL — ABNORMAL LOW (ref 12.0–16.0)
MCH: 27.8 pg (ref 26.0–34.0)
MCHC: 33.3 g/dL (ref 32.0–36.0)
MCV: 83.4 fL (ref 80.0–100.0)
PLATELETS: 313 10*3/uL (ref 150–440)
RBC: 3.05 MIL/uL — ABNORMAL LOW (ref 3.80–5.20)
RDW: 15.3 % — AB (ref 11.5–14.5)
WBC: 9.8 10*3/uL (ref 3.6–11.0)

## 2015-07-10 LAB — URINE DRUG SCREEN, QUALITATIVE (ARMC ONLY)
AMPHETAMINES, UR SCREEN: NOT DETECTED
Barbiturates, Ur Screen: NOT DETECTED
Benzodiazepine, Ur Scrn: POSITIVE — AB
COCAINE METABOLITE, UR ~~LOC~~: POSITIVE — AB
Cannabinoid 50 Ng, Ur ~~LOC~~: NOT DETECTED
MDMA (ECSTASY) UR SCREEN: NOT DETECTED
Methadone Scn, Ur: NOT DETECTED
OPIATE, UR SCREEN: POSITIVE — AB
PHENCYCLIDINE (PCP) UR S: NOT DETECTED
Tricyclic, Ur Screen: NOT DETECTED

## 2015-07-10 LAB — RAPID HIV SCREEN (HIV 1/2 AB+AG)
HIV 1/2 Antibodies: NONREACTIVE
HIV-1 P24 Antigen - HIV24: NONREACTIVE

## 2015-07-10 LAB — TROPONIN I: Troponin I: <0.03 ng/mL (ref <0.031)

## 2015-07-10 MED ORDER — ENOXAPARIN SODIUM 40 MG/0.4ML ~~LOC~~ SOLN
40.0000 mg | SUBCUTANEOUS | Status: DC
  Administered 2015-07-10: 16:00:00 40 mg via SUBCUTANEOUS
  Filled 2015-07-10: qty 0.4

## 2015-07-10 MED ORDER — OXYCODONE HCL 5 MG PO TABS
5.0000 mg | ORAL_TABLET | Freq: Four times a day (QID) | ORAL | Status: DC | PRN
  Administered 2015-07-10 – 2015-07-11 (×2): 5 mg via ORAL
  Filled 2015-07-10 (×3): qty 1

## 2015-07-10 MED ORDER — DEXTROSE 5 % IV SOLN
500.0000 mg | INTRAVENOUS | Status: DC
  Administered 2015-07-11 – 2015-07-12 (×2): 500 mg via INTRAVENOUS
  Filled 2015-07-10 (×3): qty 500

## 2015-07-10 MED ORDER — OXYCODONE-ACETAMINOPHEN 5-325 MG PO TABS
2.0000 | ORAL_TABLET | Freq: Once | ORAL | Status: AC
  Administered 2015-07-10: 2 via ORAL
  Filled 2015-07-10: qty 2

## 2015-07-10 MED ORDER — ALBUTEROL SULFATE (2.5 MG/3ML) 0.083% IN NEBU
2.5000 mg | INHALATION_SOLUTION | RESPIRATORY_TRACT | Status: DC | PRN
  Administered 2015-07-11 – 2015-07-12 (×3): 2.5 mg via RESPIRATORY_TRACT
  Filled 2015-07-10 (×3): qty 3

## 2015-07-10 MED ORDER — HYDROMORPHONE HCL 1 MG/ML IJ SOLN
1.0000 mg | Freq: Once | INTRAMUSCULAR | Status: AC
  Administered 2015-07-10: 1 mg via INTRAVENOUS
  Filled 2015-07-10: qty 1

## 2015-07-10 MED ORDER — OXYCODONE-ACETAMINOPHEN 5-325 MG PO TABS
1.0000 | ORAL_TABLET | Freq: Four times a day (QID) | ORAL | Status: DC | PRN
  Administered 2015-07-10: 1 via ORAL
  Administered 2015-07-11: 2 via ORAL
  Filled 2015-07-10: qty 2
  Filled 2015-07-10 (×2): qty 1

## 2015-07-10 MED ORDER — DOCUSATE SODIUM 100 MG PO CAPS: 100.0000 mg | ORAL_CAPSULE | Freq: Two times a day (BID) | ORAL | Status: DC | PRN

## 2015-07-10 MED ORDER — ACETAMINOPHEN 650 MG RE SUPP: 650.0000 mg | Freq: Four times a day (QID) | RECTAL | Status: DC | PRN

## 2015-07-10 MED ORDER — PRENATAL 27-0.8 MG PO TABS
1.0000 | ORAL_TABLET | Freq: Every day | ORAL | Status: DC
  Filled 2015-07-10 (×3): qty 1

## 2015-07-10 MED ORDER — ACETAMINOPHEN 325 MG PO TABS: 650.0000 mg | ORAL_TABLET | Freq: Four times a day (QID) | ORAL | Status: DC | PRN

## 2015-07-10 MED ORDER — FERROUS SULFATE 325 (65 FE) MG PO TABS
325.0000 mg | ORAL_TABLET | Freq: Three times a day (TID) | ORAL | Status: DC
  Administered 2015-07-10 – 2015-07-12 (×7): 325 mg via ORAL
  Filled 2015-07-10 (×7): qty 1

## 2015-07-10 MED ORDER — SENNA 8.6 MG PO TABS: 1.0000 | ORAL_TABLET | Freq: Every day | ORAL | Status: DC | PRN

## 2015-07-10 MED ORDER — CEFTRIAXONE SODIUM 2 G IJ SOLR
2.0000 g | INTRAMUSCULAR | Status: DC
  Administered 2015-07-11 – 2015-07-12 (×2): 2 g via INTRAVENOUS
  Filled 2015-07-10 (×3): qty 2

## 2015-07-10 MED ORDER — ONDANSETRON HCL 4 MG/2ML IJ SOLN
4.0000 mg | Freq: Once | INTRAMUSCULAR | Status: AC
  Administered 2015-07-10: 4 mg via INTRAVENOUS
  Filled 2015-07-10: qty 2

## 2015-07-10 MED ORDER — POTASSIUM CHLORIDE CRYS ER 20 MEQ PO TBCR
40.0000 meq | EXTENDED_RELEASE_TABLET | Freq: Once | ORAL | Status: AC
  Administered 2015-07-10: 15:00:00 40 meq via ORAL
  Filled 2015-07-10: qty 2

## 2015-07-10 MED ORDER — DEXTROSE 5 % IV SOLN
500.0000 mg | Freq: Once | INTRAVENOUS | Status: AC
  Administered 2015-07-10: 500 mg via INTRAVENOUS
  Filled 2015-07-10: qty 500

## 2015-07-10 MED ORDER — POTASSIUM CHLORIDE IN NACL 20-0.9 MEQ/L-% IV SOLN
INTRAVENOUS | Status: DC
  Administered 2015-07-10 – 2015-07-12 (×5): via INTRAVENOUS
  Filled 2015-07-10 (×8): qty 1000

## 2015-07-10 MED ORDER — SODIUM CHLORIDE 0.9 % IJ SOLN
3.0000 mL | Freq: Two times a day (BID) | INTRAMUSCULAR | Status: DC
  Administered 2015-07-12: 3 mL via INTRAVENOUS

## 2015-07-10 MED ORDER — DEXTROSE 5 % IV SOLN
1.0000 g | Freq: Once | INTRAVENOUS | Status: AC
  Administered 2015-07-10: 1 g via INTRAVENOUS
  Filled 2015-07-10: qty 10

## 2015-07-10 MED ORDER — MORPHINE SULFATE (PF) 2 MG/ML IV SOLN
2.0000 mg | INTRAVENOUS | Status: DC | PRN
  Administered 2015-07-10 – 2015-07-11 (×5): 2 mg via INTRAVENOUS
  Filled 2015-07-10 (×6): qty 1

## 2015-07-10 MED ORDER — KETOROLAC TROMETHAMINE 30 MG/ML IJ SOLN
15.0000 mg | INTRAMUSCULAR | Status: AC
  Administered 2015-07-10: 15 mg via INTRAVENOUS
  Filled 2015-07-10: qty 1

## 2015-07-10 NOTE — H&P (Signed)
[facility] [place] [facility]   PATIENT NAME: [name]    MR#:  [id number]  DATE OF BIRTH:  [birth year]  DATE OF ADMISSION:  07/10/2015  PRIMARY CARE PHYSICIAN: No primary care provider on file.   REQUESTING/REFERRING PHYSICIAN: Dr. Darnelle Catalan  CHIEF COMPLAINT:   Chief Complaint  Patient presents with  . Pleurisy    HISTORY OF PRESENT ILLNESS:  [name]  is a 34 y.o. 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 Patient was in the emergency room 5 days ago for similar complaints and on chest x-ray shows noted to have right middle lobe pneumonia. She was discharged on Levaquin. She hasn't had any improvement since then. Continues to spike fevers though they are low grade and also continues to have pleuritic right-sided chest pain. Patient also continues to do IV drugs in her right forearm at the needle marks sites, it seems like it is infected. She does have a history of MRSA abscesses in the past. Chest x-ray looks worse. So she is being admitted for sepsis.  PAST MEDICAL HISTORY:   Past Medical History  Diagnosis Date  . HCV (hepatitis C virus) 07/17/2013    Last Assessment & Plan:  - positive antibody most recently 08/2014 - hep C RNA 08/2014 was negative - prior to that, HCV RNA [id number] in 06/2013. No record for genotype or imaging.   - patient counseled that she could become re-infected with continued IVDU or unprotected sexual encounters.   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   Last Assessment & Plan:  Discussed routine use of Valtrex prophylaxis at 36 weeks. She will be delivered by cesarean at 36-37 weeks.   . Infection with methicillin-resistant Staphylococcus aureus 03/19/2013    Overview: Arm  abscess with MRSA, treated.  Subsequent screening negative  . Tenosynovitis 08/19/2014  . Acute deep vein thrombosis of arm 08/19/2014    Last Assessment & Plan:  - diagnosed 08/2014 in hospital - s/p heparin gtt - patient never took her xarelto x 3 month course that was planned - no symptoms today.  No further treatment - we discussed avoiding IVDU (see below) to reduce her chances of provoked upper extremity DVT   . Asthma   . IV drug abuse     PAST SURGICAL HISTORY:   Past Surgical History  Procedure Laterality Date  . Cesarean section    . Neck surgery      Fusion  . Cesarean section N/A 05/18/2015    Procedure: CESAREAN SECTION;  Surgeon: Hildred Laser, MD;  Location: [facility] ORS;  Service: Obstetrics;  Laterality: N/A;    SOCIAL HISTORY:   Social History  Substance Use Topics  . Smoking status: Current Every Day Smoker -- 1.00 packs/day for 7 years    Types: Cigarettes  . Smokeless tobacco: Never Used  . Alcohol Use: No    FAMILY HISTORY:   Family History  Problem Relation Age of Onset  . Diabetes Mother   . COPD Mother   . Hypertension Mother   . Lung cancer Mother   . Alcohol abuse Father   . Lung cancer Maternal Grandmother     DRUG ALLERGIES:   Allergies  Allergen Reactions  . Amoxicillin Rash  .  Latex   . Vancomycin     REVIEW OF SYSTEMS:   Review of Systems  Constitutional: Positive for fever, chills and malaise/fatigue. Negative for weight loss.  HENT: Positive for ear pain. Negative for ear discharge, hearing loss, nosebleeds and tinnitus.   Eyes: Negative for blurred vision, double vision and photophobia.  Respiratory: Positive for cough and shortness of breath. Negative for hemoptysis and wheezing.   Cardiovascular: Positive for chest pain. Negative for palpitations, orthopnea and leg swelling.  Gastrointestinal: Positive for nausea. Negative for heartburn, vomiting, abdominal pain, diarrhea, constipation and melena.  Genitourinary: Negative for  dysuria, urgency, frequency and hematuria.  Musculoskeletal: Positive for myalgias and neck pain. Negative for back pain.  Skin: Positive for rash.  Neurological: Positive for dizziness and weakness. Negative for tingling, tremors, sensory change, speech change, focal weakness and headaches.  Endo/Heme/Allergies: Does not bruise/bleed easily.  Psychiatric/Behavioral: Negative for depression.    MEDICATIONS AT HOME:   Prior to Admission medications   Medication Sig Start Date End Date Taking? Authorizing Provider  levofloxacin (LEVAQUIN) 750 MG tablet Take 1 tablet (750 mg total) by mouth daily. 07/05/15 07/12/15 Yes Arnaldo Natal, MD  docusate sodium (COLACE) 100 MG capsule Take 1 capsule (100 mg total) by mouth 2 (two) times daily as needed. 05/21/15   Hildred Laser, MD  enoxaparin (LOVENOX) 40 MG/0.4ML injection Inject 0.4 mLs (40 mg total) into the skin daily. 05/21/15   Hildred Laser, MD  ferrous sulfate 325 (65 FE) MG tablet Take 1 tablet (325 mg total) by mouth 3 (three) times daily with meals. 05/21/15   Hildred Laser, MD  ibuprofen (ADVIL,MOTRIN) 800 MG tablet Take 1 tablet (800 mg total) by mouth every 8 (eight) hours as needed. 05/21/15   Hildred Laser, MD  lidocaine (LIDODERM) 5 % Place 1 patch onto the skin daily. Remove & Discard patch within 12 hours or as directed by MD 05/21/15   Hildred Laser, MD  oxyCODONE-acetaminophen (PERCOCET/ROXICET) 5-325 MG per tablet Take 1-2 tablets by mouth every 6 (six) hours as needed for severe pain (for pain scale greater than 7). 05/21/15   Hildred Laser, MD  Prenatal Vit-Fe Fumarate-FA (MULTIVITAMIN-PRENATAL) 27-0.8 MG TABS tablet Take 1 tablet by mouth daily at 12 noon.    Historical Provider, MD      VITAL SIGNS:  Blood pressure 110/65, pulse 82, temperature 100.6 F (38.1 C), temperature source Oral, resp. rate 27, height 5\' 7"  (1.702 m), weight 58.968 kg (130 lb), last menstrual period 07/05/2015, SpO2 95 %, not currently breastfeeding.  PHYSICAL  EXAMINATION:   Physical Exam  GENERAL:  34 y.o.-year-old patient lying in the bed with no acute distress. Appears drowsy, but answering all questions appropriately. Seems to in moderate amount of pain. EYES: Pupils equal, round, reactive to light and accommodation. No scleral icterus. Extraocular muscles intact.  HEENT: Head atraumatic, normocephalic. Oropharynx and nasopharynx clear.  NECK:  Supple, no jugular venous distention. No thyroid enlargement, no tenderness.  LUNGS: Normal breath sounds bilaterally, no wheezing, or crepitation. Some right posterior rhonchi at the bases. No use of accessory muscles of respiration.  Pleuritic chest pain on deep inspiration. CARDIOVASCULAR: S1, S2 normal. No murmurs, rubs, or gallops.  ABDOMEN: Soft, nontender, nondistended. Bowel sounds present. No organomegaly or mass.  EXTREMITIES: No pedal edema, cyanosis, or clubbing. IV track marks noted on legs and hands. Right forearm site is erythematous, swollen and tender. NEUROLOGIC: Cranial nerves II through XII are intact. Muscle strength 5/5 in all extremities. Sensation intact. Gait  not checked.  PSYCHIATRIC: The patient is alert and oriented x 3.  SKIN: IV track sites noted on the extremities, some of them elevated and red, especially the right forearm site appears infected.  LABORATORY PANEL:   CBC  Recent Labs Lab 07/10/15 0509  WBC 9.8  HGB 8.5*  HCT 25.5*  PLT 313   ------------------------------------------------------------------------------------------------------------------  Chemistries   Recent Labs Lab 07/05/15 1405 07/10/15 0509  NA 136 134*  K 3.1* 3.0*  CL 100* 97*  CO2 25 26  GLUCOSE 164* 122*  BUN 8 11  CREATININE 0.67 0.94  CALCIUM 8.7* 8.6*  AST 24  --   ALT 15  --   ALKPHOS 81  --   BILITOT <0.1*  --    ------------------------------------------------------------------------------------------------------------------  Cardiac Enzymes  Recent Labs Lab  07/10/15 0509  TROPONINI <0.03   ------------------------------------------------------------------------------------------------------------------  RADIOLOGY:  Dg Chest 2 View  07/10/2015   CLINICAL DATA:  Mid right-sided chest pain. Shortness of breath. Symptoms for 5 days.  EXAM: CHEST  2 VIEW  COMPARISON:  Radiographs and CT 5 days prior 07/05/2015  FINDINGS: Worsening right middle lobe aeration with developing opacity anteriorly. Tiny right pleural effusion. The left lung is clear. The heart size is normal. Pulmonary vasculature is normal. No pneumothorax. Surgical hardware in the lower cervical spine, partially included.  IMPRESSION: Worsening right middle lobe aeration with developing opacity anteriorly, concerning for new/progressive pneumonia.   Electronically Signed   By: Rubye Oaks M.D.   On: 07/10/2015 05:16    EKG:   Orders placed or performed during the hospital encounter of 07/10/15  . ED EKG within 10 minutes  . ED EKG within 10 minutes  . EKG 12-Lead  . EKG 12-Lead    IMPRESSION AND PLAN:   [name]  is a 34 y.o. 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   #1 Sepsis: Secondary to pneumonia. White count is low due to being on antibiotics. -Continues to have fevers. -1 cultures repeat ordered as continues to use IV drugs. -Chest x-ray with worsening right middle lobe opacity. CT of the chest done 5 days ago did not show any PE and just confirms the right middle lobe pneumonia. -ID consult because of her complicated history. -No new murmur on cardiac exam. We'll get an echocardiogram to rule out any endocarditis. -HIV ordered. -Since she was already on Levaquin, changed antibiotics to vancomycin, Zosyn and azithromycin.  #2 Forearm cellulitis: Secondary to IV drug  abuse -On IV antibiotics. His GU of MRSA cellulitis and abscess, so vancomycin being ordered.  #3 Hypokalemia: Being replaced  #4 IV heroin abuse: Counseled strongly. Urine drug screen pending. -Starts to withdrawal will need to put her on withdrawal protocol. We'll check with pharmacy about any methadone protocol.  #5 H/o DVT: DVT in right arm, noncompliant with use of anticoagulation as outpatient. She needs to be on Lovenox twice a day, but her med rec shows only daily but it seems like patient is not taking either anyway. -Not sure the clot is secondary to phlebitis from her IV drug use. -We will get a Doppler of that extremity to see any residual clot. -Unless the Doppler is positive, continue Lovenox daily for prophylaxis at this time. Patient also has history of anemia.  #6 Tobacco use disorder: Counseled  against smoking for almost 3 minutes. -Not ready to quit. Refuses any nicotine replacements at this time.  #7 Hepatitis C-outpatient follow-up recommended:  #8 DVT prophylaxis- on lovenox qdaily  #9 anemia-anemia of chronic disease. Recent pregnancy in July 2016. Hemoglobin stable around 8.  All the records are reviewed and case discussed with ED provider. Management plans discussed with the patient, family and they are in agreement.  CODE STATUS: FULL CODE  TOTAL TIME TAKING CARE OF THIS PATIENT: 50 minutes.    Enid Baas M.D on 07/10/2015 at 9:58 AM  Between 7am to 6pm - Pager - [phone]  After 6pm go to [email or web address] - password [email or web address] [facility]  [facility]  Office  [phone]  CC: Primary care physician; No primary care provider on file.

## 2015-07-10 NOTE — ED Notes (Signed)
Pt back from X-ray.  

## 2015-07-10 NOTE — Clinical Social Work Note (Signed)
Patient's newborn is currently in SCN due to drug exposure and prematurity. This CSW did initial assessment with patient when she had her newborn and made a DSS CPS report in [place] at that time. DSS CPS is currently following patient due to her newborn testing positive for drugs. The CPS caseworker is: Anselmo Rod: [phone]. CSW will attempt to see patient tomorrow and will notify DSS CPS of patient's mother current situation. Patient's nurse updated. York Spaniel MSW,LCSW [phone]

## 2015-07-10 NOTE — ED Provider Notes (Signed)
[facility] Emergency Department Provider Note  ____________________________________________  Time seen: Approximately 5:36 AM  I have reviewed the triage vital signs and the nursing notes.   HISTORY  Chief Complaint Pleurisy    HPI [name] is a 34 y.o. female patient seen a few days ago had a small pleural-based apparent pneumonia on CT of the chest. Patient reports the pain isn't worsened she is out of the Toradol and I gave her. Patient's again other also reports that she felt very warm today like she had a fever. The patient is severe complaining of severe pain pleuritic in nature worse than what it was last time I saw her   Past Medical History  Diagnosis Date  . HCV (hepatitis C virus) 07/17/2013    Last Assessment & Plan:  - positive antibody most recently 08/2014 - hep C RNA 08/2014 was negative - prior to that, HCV RNA [id number] in 06/2013. No record for genotype or imaging.   - patient counseled that she could become re-infected with continued IVDU or unprotected sexual encounters.   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   Last Assessment & Plan:  Discussed routine use of Valtrex prophylaxis at 36 weeks. She will be delivered by cesarean at 36-37 weeks.   . Infection with methicillin-resistant Staphylococcus aureus 03/19/2013    Overview: Arm abscess with MRSA, treated.  Subsequent screening negative  . Tenosynovitis 08/19/2014  . Acute deep vein thrombosis of arm 08/19/2014    Last Assessment & Plan:  - diagnosed 08/2014 in hospital - s/p heparin gtt - patient never took her xarelto x 3 month course that was planned - no symptoms today.  No further treatment - we discussed avoiding IVDU (see below) to reduce her chances of provoked upper extremity DVT   . Asthma     Patient Active Problem List   Diagnosis Date Noted  . Oligohydramnios 05/18/2015  . S/P cesarean section 05/18/2015  .  Preterm uterine contractions 05/17/2015  . Oligohydramnios antepartum 05/17/2015  . H/O cesarean section complicating pregnancy 05/17/2015  . Preterm contractions 05/17/2015  . AA (alcohol abuse) 12/08/2014  . Drug abuse during pregnancy 12/08/2014  . High risk sexual behavior 12/08/2014  . Tenosynovitis 08/19/2014  . Acute deep vein thrombosis of arm 08/19/2014  . HCV (hepatitis C virus) 07/17/2013  . H/O cesarean section 05/18/2013  . Herpes infection in pregnancy 05/18/2013  . Infection with methicillin-resistant Staphylococcus aureus 03/19/2013  . Cannabis abuse 03/18/2013  . Polysubstance abuse 03/18/2013  . Compulsive tobacco user syndrome 03/17/2013    Past Surgical History  Procedure Laterality Date  . Cesarean section    . Neck surgery      Fusion  . Cesarean section N/A 05/18/2015    Procedure: CESAREAN SECTION;  Surgeon: Hildred Laser, MD;  Location: [facility] ORS;  Service: Obstetrics;  Laterality: N/A;    Current Outpatient Rx  Name  Route  Sig  Dispense  Refill  . levofloxacin (LEVAQUIN) 750 MG tablet   Oral   Take 1 tablet (750 mg total) by mouth daily.   7 tablet   0   . docusate sodium (COLACE) 100 MG capsule   Oral   Take 1 capsule (100 mg total) by mouth 2 (two) times daily as needed.   30 capsule   2   . enoxaparin (LOVENOX) 40 MG/0.4ML injection   Subcutaneous   Inject 0.4 mLs (40 mg total) into  the skin daily.   30 Syringe   1   . ferrous sulfate 325 (65 FE) MG tablet   Oral   Take 1 tablet (325 mg total) by mouth 3 (three) times daily with meals.   90 tablet   3   . ibuprofen (ADVIL,MOTRIN) 800 MG tablet   Oral   Take 1 tablet (800 mg total) by mouth every 8 (eight) hours as needed.   60 tablet   1   . lidocaine (LIDODERM) 5 %   Transdermal   Place 1 patch onto the skin daily. Remove & Discard patch within 12 hours or as directed by MD   3 patch   0   . oxyCODONE-acetaminophen (PERCOCET/ROXICET) 5-325 MG per tablet   Oral   Take 1-2  tablets by mouth every 6 (six) hours as needed for severe pain (for pain scale greater than 7).   30 tablet   0   . Prenatal Vit-Fe Fumarate-FA (MULTIVITAMIN-PRENATAL) 27-0.8 MG TABS tablet   Oral   Take 1 tablet by mouth daily at 12 noon.           Allergies Amoxicillin; Latex; and Vancomycin  Family History  Problem Relation Age of Onset  . Diabetes Mother   . COPD Mother   . Hypertension Mother   . Lung cancer Mother   . Alcohol abuse Father   . Lung cancer Maternal Grandmother     Social History Social History  Substance Use Topics  . Smoking status: Current Every Day Smoker -- 1.00 packs/day for 7 years    Types: Cigarettes  . Smokeless tobacco: Never Used  . Alcohol Use: No    Review of Systems Constitutional:  fever/chills Eyes: No visual changes. Patient says the pain is making her see double at times. Patient complains of earache as well. ENT: No sore throat. Cardiovascular: See history of present illness Respiratory: Patient complains of shortness of breath because of the pain she is having Gastrointestinal: No abdominal pain.  No nausea, no vomiting.  No diarrhea.  No constipation. Genitourinary: Negative for dysuria. Musculoskeletal: Negative for back pain. Skin: Negative for rash. Neurological: Negative for headaches, focal weakness or numbness.  10-point ROS otherwise negative.  ____________________________________________   PHYSICAL EXAM:  VITAL SIGNS: ED Triage Vitals  Enc Vitals Group     BP 07/10/15 0439 111/60 mmHg     Pulse Rate 07/10/15 0439 91     Resp 07/10/15 0439 32     Temp 07/10/15 0439 100.6 F (38.1 C)     Temp Source 07/10/15 0439 Oral     SpO2 07/10/15 0439 97 %     Weight 07/10/15 0439 130 lb (58.968 kg)     Height 07/10/15 0439 5\' 7"  (1.702 m)     Head Cir --      Peak Flow --      Pain Score 07/10/15 0440 10     Pain Loc --      Pain Edu? --      Excl. in GC? --     Constitutional: Alert and oriented. In pain  hunched over holding her chest against the pillow Eyes: Conjunctivae are normal. PERRL. EOMI. ears TMs are clear Head: Atraumatic. Nose: No congestion/rhinnorhea. Mouth/Throat: Mucous membranes are moist.  Oropharynx non-erythematous. Neck: No stridor.   Cardiovascular: Normal rate, regular rhythm. Grossly normal heart sounds.  Good peripheral circulation. Respiratory: Normal respiratory effort.  No retractions. Lungs CTAB. Gastrointestinal: Soft and nontender. No distention. No abdominal bruits. No  CVA tenderness. Musculoskeletal: No lower extremity tenderness nor edema.  No joint effusions. Neurologic:  Normal speech and language. No gross focal neurologic deficits are appreciated. No gait instability.   ____________________________________________   LABS (all labs ordered are listed, but only abnormal results are displayed)  Labs Reviewed  BASIC METABOLIC PANEL - Abnormal; Notable for the following:    Sodium 134 (*)    Potassium 3.0 (*)    Chloride 97 (*)    Glucose, Bld 122 (*)    Calcium 8.6 (*)    All other components within normal limits  CBC - Abnormal; Notable for the following:    RBC 3.05 (*)    Hemoglobin 8.5 (*)    HCT 25.5 (*)    RDW 15.3 (*)    All other components within normal limits  TROPONIN I   ____________________________________________  EKG   ____________________________________________  RADIOLOGY  Right-sided pneumonia ____________________________________________   PROCEDURES   ____________________________________________   INITIAL IMPRESSION / ASSESSMENT AND PLAN / ED COURSE  Pertinent labs & imaging results that were available during my care of the patient were reviewed by me and considered in my medical decision making (see chart for details).  Patient's chest x-ray shows worsening pneumonia. Patient has a fever. Patient is not hypoxic however she is in severe pain and she has failed outpatient management with a good antibiotics  name of the Levaquin I believe she would benefit from inpatient management ____________________________________________   FINAL CLINICAL IMPRESSION(S) / ED DIAGNOSES  Final diagnoses:  Community acquired pneumonia      Arnaldo Natal, MD 07/10/15 [phone]

## 2015-07-10 NOTE — ED Notes (Signed)
Pt c/o chest and back pain. Pt states recently dx'd w/ pneumonia and has been taking abx w/o relief or resolution. Pt states extreme dyspnea and is grunting when she breathes in triage. Pt states she cannot sleep due to the dyspnea.

## 2015-07-10 NOTE — ED Notes (Signed)
Spoke with [place], pt to go to [place] then go to the floor from [place]. [place] tech to call when pt is ready.

## 2015-07-10 NOTE — ED Notes (Addendum)
Pt states to this RN, "You know I'm an IV heroin user right?" in regards to pain medicine. Pt requests something stronger than Toradol. Pt states that the last time she used heroin was yesterday. Pt also c/o burning at IV site, no redness or swelling noted at this time.

## 2015-07-10 NOTE — ED Notes (Signed)
Pt noted to be laying in bed with her dropped down on her chest. Pt awakens with verbal stimulus. Pt noted to have drooping eye lids at this time.

## 2015-07-10 NOTE — Progress Notes (Signed)
PT Cancellation Note  Patient Details Name: [name] MRN: [id number] DOB: [birth year]   Cancelled Treatment:    Reason Eval/Treat Not Completed: PT screened, no needs identified, will sign off. Spoke with patient who reports no history of mobility impairments in the past. At time of screening pt is self limiting mobility due to 10/10 pain and dyspnea, but reports firmly that she does not need any assistance with mobility. PT signing off, please put in additional PT order as needed. Thank you!    Quinnetta Roepke C 07/10/2015, 3:46 PM  3:49 PM  Rosamaria Lints, PT, DPT [place] License # [id number]

## 2015-07-10 NOTE — Consult Note (Signed)
[facility] Infectious Disease     Reason for Consult: PNA Referring Physician: Antoine Primas Date of Admission:  07/10/2015   Active Problems:   Sepsis   HPI: [name] is a 34 y.o. 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 Patient was in the emergency room 5 days ago for similar complaints and on chest x-ray shows noted to have right middle lobe pneumonia. She was discharged on Levaquin. She hasn't had any improvement since then. Continues to spike fevers though they are low grade and also continues to have pleuritic right-sided chest pain. Patient also continues to do IV drugs in her right forearm at the needle marks sites, it seems like it is infected. She does have a history of MRSA abscesses in the past. She is allergic to vanco and penicillin.   At last ED visit a week ago  CT shows focal small area of infection in R lung but not impressive for a significant PNA.  FU CXR this admit shows worsening R infiltrate.  Since admit has had temp 100.6. Has been on ceftriaxone and azithromycin..  She does not report feeling any better.   Past Medical History  Diagnosis Date  . HCV (hepatitis C virus) 07/17/2013    Last Assessment & Plan:  - positive antibody most recently 08/2014 - hep C RNA 08/2014 was negative - prior to that, HCV RNA [id number] in 06/2013. No record for genotype or imaging.   - patient counseled that she could become re-infected with continued IVDU or unprotected sexual encounters.   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   Last Assessment & Plan:  Discussed routine use of Valtrex prophylaxis at 36 weeks. She will be delivered by cesarean at 36-37 weeks.   . Infection with methicillin-resistant Staphylococcus aureus  03/19/2013    Overview: Arm abscess with MRSA, treated.  Subsequent screening negative  . Tenosynovitis 08/19/2014  . Acute deep vein thrombosis of arm 08/19/2014    Last Assessment & Plan:  - diagnosed 08/2014 in hospital - s/p heparin gtt - patient never took her xarelto x 3 month course that was planned - no symptoms today.  No further treatment - we discussed avoiding IVDU (see below) to reduce her chances of provoked upper extremity DVT   . Asthma   . IV drug abuse    Past Surgical History  Procedure Laterality Date  . Cesarean section    . Neck surgery      Fusion  . Cesarean section N/A 05/18/2015    Procedure: CESAREAN SECTION;  Surgeon: Rubie Maid, MD;  Location: [facility] ORS;  Service: Obstetrics;  Laterality: N/A;   Social History  Substance Use Topics  . Smoking status: Current Every Day Smoker -- 1.00 packs/day for 7 years    Types: Cigarettes  . Smokeless tobacco: Never Used  . Alcohol Use: No   Family History  Problem Relation Age of Onset  . Diabetes Mother   . COPD Mother   . Hypertension Mother   . Lung cancer Mother   . Alcohol abuse Father   . Lung cancer Maternal Grandmother     Allergies:  Allergies  Allergen Reactions  . Amoxicillin Rash  . Latex   . Vancomycin  Current antibiotics: Antibiotics Given (last 72 hours)    None      MEDICATIONS: . [START ON 07/11/2015] azithromycin  500 mg Intravenous Q24H  . [START ON 07/11/2015] cefTRIAXone (ROCEPHIN)  IV  2 g Intravenous Q24H  . enoxaparin  40 mg Subcutaneous Q24H  . ferrous sulfate  325 mg Oral TID WC  . multivitamin-prenatal  1 tablet Oral Q1200  . sodium chloride  3 mL Intravenous Q12H    Review of Systems - 11 systems reviewed and negative per HPI   OBJECTIVE: Temp:  [97.6 F (36.4 C)-100.6 F (38.1 C)] 98.4 F (36.9 C) (08/27 1418) Pulse Rate:  [78-91] 81 (08/27 1546) Resp:  [19-32] 20 (08/27 1418) BP: (99-120)/(50-65) 119/60 mmHg (08/27 1418) SpO2:  [95 %-100 %] 95 % (08/27  1546) Weight:  [58.968 kg (130 lb)] 58.968 kg (130 lb) (08/27 0439) Physical Exam  Constitutional:  oriented to person, place, and time. Lethargic apperaing, chronically ill apperaing. Older than stated age HENT: [place]/AT, PERRLA, no scleral icterus Mouth/Throat: Oropharynx is clear and moist. No oropharyngeal exudate.  Cardiovascular: Normal rate, regular rhythm and normal heart sounds. Exam reveals no gallop and no friction rub.  No murmur heard.  Pulmonary/Chest: Rhonchi R  Neck supple, no nuchal rigidity Abdominal: Soft. Bowel sounds are normal.  exhibits no distension. There is no tenderness.  Lymphadenopathy: no cervical adenopathy. No axillary adenopathy Neurological: alert and oriented to person, place, and time.  Skin: R foreamr with area of induration and rendess which is ttp  Psychiatric:somewhat lethargic, flat affect, asking for pain meds.    LABS: Results for orders placed or performed during the hospital encounter of 07/10/15 (from the past 48 hour(s))  Basic metabolic panel     Status: Abnormal   Collection Time: 07/10/15  5:09 AM  Result Value Ref Range   Sodium 134 (L) 135 - 145 mmol/L   Potassium 3.0 (L) 3.5 - 5.1 mmol/L   Chloride 97 (L) 101 - 111 mmol/L   CO2 26 22 - 32 mmol/L   Glucose, Bld 122 (H) 65 - 99 mg/dL   BUN 11 6 - 20 mg/dL   Creatinine, Ser 0.94 0.44 - 1.00 mg/dL   Calcium 8.6 (L) 8.9 - 10.3 mg/dL   GFR calc non Af Amer >60 >60 mL/min   GFR calc Af Amer >60 >60 mL/min    Comment: (NOTE) The eGFR has been calculated using the CKD EPI equation. This calculation has not been validated in all clinical situations. eGFR's persistently <60 mL/min signify possible Chronic Kidney Disease.    Anion gap 11 5 - 15  CBC     Status: Abnormal   Collection Time: 07/10/15  5:09 AM  Result Value Ref Range   WBC 9.8 3.6 - 11.0 K/uL   RBC 3.05 (L) 3.80 - 5.20 MIL/uL   Hemoglobin 8.5 (L) 12.0 - 16.0 g/dL   HCT 25.5 (L) 35.0 - 47.0 %   MCV 83.4 80.0 - 100.0 fL    MCH 27.8 26.0 - 34.0 pg   MCHC 33.3 32.0 - 36.0 g/dL   RDW 15.3 (H) 11.5 - 14.5 %   Platelets 313 150 - 440 K/uL  Troponin I     Status: None   Collection Time: 07/10/15  5:09 AM  Result Value Ref Range   Troponin I <0.03 <0.031 ng/mL    Comment:        NO INDICATION OF MYOCARDIAL INJURY.   Rapid HIV screen (HIV 1/2 Ab+Ag)  Status: None   Collection Time: 07/10/15  5:09 AM  Result Value Ref Range   HIV-1 P24 Antigen - HIV24 NON REACTIVE NON REACTIVE   HIV 1/2 Antibodies NON REACTIVE NON REACTIVE   Interpretation (HIV Ag Ab)      A non reactive test result means that HIV 1 or HIV 2 antibodies and HIV 1 p24 antigen were not detected in the specimen.   No components found for: ESR, C REACTIVE PROTEIN MICRO: Recent Results (from the past 720 hour(s))  Culture, blood (routine x 2)     Status: None   Collection Time: 07/05/15  3:40 PM  Result Value Ref Range Status   Specimen Description BLOOD RIGHT FOOT  Final   Special Requests BOTTLES DRAWN AEROBIC AND ANAEROBIC 5CC  Final   Culture NO GROWTH 5 DAYS  Final   Report Status 07/10/2015 FINAL  Final  Culture, blood (routine x 2)     Status: None   Collection Time: 07/05/15  4:00 PM  Result Value Ref Range Status   Specimen Description BLOOD RIGHT ARM  Final   Special Requests BOTTLES DRAWN AEROBIC AND ANAEROBIC 5CC  Final   Culture NO GROWTH 5 DAYS  Final   Report Status 07/10/2015 FINAL  Final    IMAGING: Dg Chest 2 View  07/10/2015   CLINICAL DATA:  Mid right-sided chest pain. Shortness of breath. Symptoms for 5 days.  EXAM: CHEST  2 VIEW  COMPARISON:  Radiographs and CT 5 days prior 07/05/2015  FINDINGS: Worsening right middle lobe aeration with developing opacity anteriorly. Tiny right pleural effusion. The left lung is clear. The heart size is normal. Pulmonary vasculature is normal. No pneumothorax. Surgical hardware in the lower cervical spine, partially included.  IMPRESSION: Worsening right middle lobe aeration with  developing opacity anteriorly, concerning for new/progressive pneumonia.   Electronically Signed   By: Jeb Levering M.D.   On: 07/10/2015 05:16   Dg Chest 2 View  07/05/2015   CLINICAL DATA:  Shortness of breath, right chest pain since yesterday.  EXAM: CHEST  2 VIEW  COMPARISON:  06/21/2008  FINDINGS: The heart size and mediastinal contours are within normal limits. Both lungs are clear. The visualized skeletal structures are unremarkable.  IMPRESSION: No active cardiopulmonary disease.   Electronically Signed   By: Rolm Baptise M.D.   On: 07/05/2015 14:37   Ct Angio Chest Pe W/cm &/or Wo Cm  07/05/2015   CLINICAL DATA:  Shortness of breath, chest pain starting this morning, elevated D-dimer, gave birth 80 weeks ago  EXAM: CT ANGIOGRAPHY CHEST WITH CONTRAST  TECHNIQUE: Multidetector CT imaging of the chest was performed using the standard protocol during bolus administration of intravenous contrast. Multiplanar CT image reconstructions and MIPs were obtained to evaluate the vascular anatomy.  CONTRAST:  97m OMNIPAQUE IOHEXOL 350 MG/ML SOLN  COMPARISON:  None.  FINDINGS: There is trace right pleural effusion right base posterior atelectasis. Central airways are patent. Images of the thoracic inlet are unremarkable.  No pulmonary embolus. There is no mediastinal hematoma or adenopathy. No hilar adenopathy.  Sagittal images of the spine shows normal alignment. No destructive bony lesions are noted.  There is no pneumothorax. There is pleural based triangular-shaped infiltrate in right middle lobe laterally Measures about 1.6 cm. There is a nodule along the fissure in right middle lobe best seen in coronal image 21 measures 1 cm. This may be reactive. Clinical correlation is necessary. Follow-up examination after appropriate treatment is recommended to assure resolution.  Review of the MIP images confirms the above findings.  IMPRESSION: 1. No pulmonary embolus is noted. Trace right pleural effusion with  right base posterior atelectasis. 2. There is triangular-shaped pleural-based infiltrate or focal pneumonia in right middle lobe laterally see axial image 77. Measures about 1.6 cm. A nodule along the fissure may represent a reactive lymph nodes. Follow-up examination to assure resolution after appropriate treatment is recommended. 3. No mediastinal hematoma or adenopathy.  No hilar adenopathy. These results were called by telephone at the time of interpretation on 07/05/2015 at 4:49 pm to Dr. Conni Slipper , who verbally acknowledged these results.   Electronically Signed   By: Lahoma Crocker M.D.   On: 07/05/2015 16:50   US Venous Img Upper Uni Right  07/10/2015   CLINICAL DATA:  Swelling involving the right arm. History of intravenous drug abuse. Evaluate for DVT.  EXAM: RIGHT UPPER EXTREMITY VENOUS DOPPLER ULTRASOUND  TECHNIQUE: Gray-scale sonography with graded compression, as well as color Doppler and duplex ultrasound were performed to evaluate the upper extremity deep venous system from the level of the subclavian vein and including the jugular, axillary, basilic, radial, ulnar and upper cephalic vein. Spectral Doppler was utilized to evaluate flow at rest and with distal augmentation maneuvers.  COMPARISON:  Right humerus radiographs -06/21/2008  FINDINGS: Contralateral Subclavian Vein: Respiratory phasicity is normal and symmetric with the symptomatic side. No evidence of thrombus. Normal compressibility.  Internal Jugular Vein: No evidence of thrombus. Normal compressibility, respiratory phasicity and response to augmentation.  Subclavian Vein: No evidence of thrombus. Normal compressibility, respiratory phasicity and response to augmentation.  Axillary Vein: No evidence of thrombus. Normal compressibility, respiratory phasicity and response to augmentation.  Cephalic Vein: No evidence of thrombus. Normal compressibility, respiratory phasicity and response to augmentation.  Basilic Vein: No evidence of  thrombus. Normal compressibility, respiratory phasicity and response to augmentation.  Brachial Veins: No evidence of thrombus within either of the paired brachial veins. Normal compressibility, respiratory phasicity and response to augmentation.  Radial Veins: No evidence of thrombus. Normal compressibility, respiratory phasicity and response to augmentation.  Ulnar Veins: No evidence of thrombus. Normal compressibility, respiratory phasicity and response to augmentation.  Venous Reflux:  None visualized.  Other Findings:  None visualized.  IMPRESSION: No evidence of DVT within the right upper extremity.   Electronically Signed   By: Sandi Mariscal M.D.   On: 07/10/2015 10:16    Assessment:   [name] is a 34 y.o. female with Hep C, IVDU, prior MRSA abscesses, prior RUE DVT admitted with progressive R sided cp and R ant infiltrate on CXR.  She had CT chest 8.22 with no PE.  She does have a murmur on exam (reports having one since age 14) and echo is pending. Has infection at injection site on R arm but neg for DVT there.  She is allergic to pcn and vanco.  She is on ceftriaxone and azithromycin. HIV is negative  Recommendations Would add clinda to the current abx- this can provide coverage for possible aspiration as well as for possible MRSA Await echo to look for R sided endocarditis. Await bcx results   Thank you very much for allowing me to participate in the care of this patient. Please call with questions.   Cheral Marker. Ola Spurr, MD

## 2015-07-10 NOTE — Plan of Care (Signed)
Problem: Discharge Progression Outcomes Goal: Other Discharge Outcomes/Goals Outcome: Progressing IV antibiotics for pneumonia Pain control Monitor for detox, pt uses cocaine and heroin  Hepatitis C positive. Use universal precaution's.

## 2015-07-10 NOTE — ED Notes (Signed)
Patient transported to X-ray 

## 2015-07-10 NOTE — Progress Notes (Signed)
Took over pt care at 1600, pt alert and oriented, calm and cooperative with care, PRN pain meds given per orders with relief, pt voices any needs

## 2015-07-10 NOTE — Progress Notes (Signed)
*  PRELIMINARY RESULTS* Echocardiogram 2D Echocardiogram has been performed.  [name] 07/10/2015, 3:12 PM

## 2015-07-11 ENCOUNTER — Inpatient Hospital Stay: Payer: Medicaid Other

## 2015-07-11 LAB — EXPECTORATED SPUTUM ASSESSMENT W GRAM STAIN, RFLX TO RESP C: Special Requests: NORMAL

## 2015-07-11 LAB — CBC
HCT: 22.8 % — ABNORMAL LOW (ref 35.0–47.0)
Hemoglobin: 7.3 g/dL — ABNORMAL LOW (ref 12.0–16.0)
MCH: 26.5 pg (ref 26.0–34.0)
MCHC: 32.1 g/dL (ref 32.0–36.0)
MCV: 82.8 fL (ref 80.0–100.0)
PLATELETS: 308 10*3/uL (ref 150–440)
RBC: 2.76 MIL/uL — AB (ref 3.80–5.20)
RDW: 15.5 % — AB (ref 11.5–14.5)
WBC: 9.8 10*3/uL (ref 3.6–11.0)

## 2015-07-11 LAB — HIV ANTIBODY (ROUTINE TESTING W REFLEX): HIV Screen 4th Generation wRfx: NONREACTIVE

## 2015-07-11 LAB — BASIC METABOLIC PANEL
Anion gap: 4 — ABNORMAL LOW (ref 5–15)
BUN: 10 mg/dL (ref 6–20)
CALCIUM: 8 mg/dL — AB (ref 8.9–10.3)
CHLORIDE: 109 mmol/L (ref 101–111)
CO2: 25 mmol/L (ref 22–32)
CREATININE: 0.65 mg/dL (ref 0.44–1.00)
GFR calc non Af Amer: >60 mL/min (ref >60)
Glucose, Bld: 112 mg/dL — ABNORMAL HIGH (ref 65–99)
Potassium: 4.1 mmol/L (ref 3.5–5.1)
Sodium: 138 mmol/L (ref 135–145)

## 2015-07-11 MED ORDER — MORPHINE SULFATE (PF) 4 MG/ML IV SOLN
4.0000 mg | INTRAVENOUS | Status: DC | PRN
  Administered 2015-07-11 – 2015-07-12 (×7): 4 mg via INTRAVENOUS
  Filled 2015-07-11 (×8): qty 1

## 2015-07-11 MED ORDER — MORPHINE SULFATE (PF) 2 MG/ML IV SOLN
2.0000 mg | INTRAVENOUS | Status: AC
  Administered 2015-07-11: 2 mg via INTRAVENOUS

## 2015-07-11 MED ORDER — DIPHENHYDRAMINE HCL 25 MG PO CAPS
25.0000 mg | ORAL_CAPSULE | ORAL | Status: DC | PRN
  Administered 2015-07-11 – 2015-07-12 (×3): 25 mg via ORAL
  Filled 2015-07-11 (×3): qty 1

## 2015-07-11 MED ORDER — CLINDAMYCIN PHOSPHATE 600 MG/50ML IV SOLN
600.0000 mg | Freq: Three times a day (TID) | INTRAVENOUS | Status: DC
  Administered 2015-07-11: 13:00:00 600 mg via INTRAVENOUS
  Filled 2015-07-11 (×4): qty 50

## 2015-07-11 NOTE — Progress Notes (Signed)
[facility] CLINIC INFECTIOUS DISEASE PROGRESS NOTE Date of Admission:  07/10/2015     ID: HENRIETTA CIESLEWICZ is a 34 y.o. female with  PNA, IVDU  Active Problems:   Sepsis   Subjective: Still with bad R chest pain, low grade fever  ROS  Eleven systems are reviewed and negative except per hpi  Medications:  Antibiotics Given (last 72 hours)    Date/Time Action Medication Dose Rate   07/11/15 0946 Given   cefTRIAXone (ROCEPHIN) 2 g in dextrose 5 % 50 mL IVPB 2 g 100 mL/hr   07/11/15 1022 Given   azithromycin (ZITHROMAX) 500 mg in dextrose 5 % 250 mL IVPB 500 mg 250 mL/hr     . azithromycin  500 mg Intravenous Q24H  . cefTRIAXone (ROCEPHIN)  IV  2 g Intravenous Q24H  . enoxaparin  40 mg Subcutaneous Q24H  . ferrous sulfate  325 mg Oral TID WC  . multivitamin-prenatal  1 tablet Oral Q1200  . sodium chloride  3 mL Intravenous Q12H    Objective: Vital signs in last 24 hours: Temp:  [98.4 F (36.9 C)-100.4 F (38 C)] 99.1 F (37.3 C) (08/28 0615) Pulse Rate:  [79-105] 88 (08/28 0615) Resp:  [18-20] 18 (08/28 0615) BP: (115-129)/(60-66) 115/60 mmHg (08/28 0615) SpO2:  [93 %-100 %] 98 % (08/28 0615)  Lab Results  Recent Labs  07/10/15 0509 07/11/15 0610  WBC 9.8 9.8  HGB 8.5* 7.3*  HCT 25.5* 22.8*  NA 134* 138  K 3.0* 4.1  CL 97* 109  CO2 26 25  BUN 11 10  CREATININE 0.94 0.65    Microbiology: @ Studies/Results: Dg Chest 2 View  07/10/2015   CLINICAL DATA:  Mid right-sided chest pain. Shortness of breath. Symptoms for 5 days.  EXAM: CHEST  2 VIEW  COMPARISON:  Radiographs and CT 5 days prior 07/05/2015  FINDINGS: Worsening right middle lobe aeration with developing opacity anteriorly. Tiny right pleural effusion. The left lung is clear. The heart size is normal. Pulmonary vasculature is normal. No pneumothorax. Surgical hardware in the lower cervical spine, partially included.  IMPRESSION: Worsening right middle lobe aeration with developing opacity anteriorly,  concerning for new/progressive pneumonia.   Electronically Signed   By: Rubye Oaks M.D.   On: 07/10/2015 05:16   US Venous Img Upper Uni Right  07/10/2015   CLINICAL DATA:  Swelling involving the right arm. History of intravenous drug abuse. Evaluate for DVT.  EXAM: RIGHT UPPER EXTREMITY VENOUS DOPPLER ULTRASOUND  TECHNIQUE: Gray-scale sonography with graded compression, as well as color Doppler and duplex ultrasound were performed to evaluate the upper extremity deep venous system from the level of the subclavian vein and including the jugular, axillary, basilic, radial, ulnar and upper cephalic vein. Spectral Doppler was utilized to evaluate flow at rest and with distal augmentation maneuvers.  COMPARISON:  Right humerus radiographs -06/21/2008  FINDINGS: Contralateral Subclavian Vein: Respiratory phasicity is normal and symmetric with the symptomatic side. No evidence of thrombus. Normal compressibility.  Internal Jugular Vein: No evidence of thrombus. Normal compressibility, respiratory phasicity and response to augmentation.  Subclavian Vein: No evidence of thrombus. Normal compressibility, respiratory phasicity and response to augmentation.  Axillary Vein: No evidence of thrombus. Normal compressibility, respiratory phasicity and response to augmentation.  Cephalic Vein: No evidence of thrombus. Normal compressibility, respiratory phasicity and response to augmentation.  Basilic Vein: No evidence of thrombus. Normal compressibility, respiratory phasicity and response to augmentation.  Brachial Veins: No evidence of thrombus within either of the  paired brachial veins. Normal compressibility, respiratory phasicity and response to augmentation.  Radial Veins: No evidence of thrombus. Normal compressibility, respiratory phasicity and response to augmentation.  Ulnar Veins: No evidence of thrombus. Normal compressibility, respiratory phasicity and response to augmentation.  Venous Reflux:  None visualized.   Other Findings:  None visualized.  IMPRESSION: No evidence of DVT within the right upper extremity.   Electronically Signed   By: Simonne Come M.D.   On: 07/10/2015 10:16    Assessment/Plan: WILHELMINA HARK is a 34 y.o. female with Hep C, IVDU, prior MRSA abscesses, prior RUE DVT admitted with progressive R sided cp and R ant infiltrate on CXR. She had CT chest 8.22 with no PE. She does have a murmur on exam (reports having one since age 52) and echo is pending. Has infection at injection site on R arm but neg for DVT there.  She is allergic to pcn and vanco. She is on ceftriaxone and azithromycin. HIV is negative Echo neg for vegetations. BCX neg to date Sending sputum today. CT shows impressive consolidation and some pleural effusion.   Recommendations Cont  clinda ceftraixone and azithro   Sputum being sent today If worsens would consider thoracentesis to eval for empyema  Thank you very much for the consult. Will follow with you.  FITZGERALD, DAVID   07/11/2015, 10:31 AM

## 2015-07-11 NOTE — Progress Notes (Signed)
[facility] [place] [facility]   PATIENT NAME: [name]    MR#:  [id number]  DATE OF BIRTH:  Oct 17, 1981  SUBJECTIVE:  CHIEF COMPLAINT:   Chief Complaint  Patient presents with  . Pleurisy   Doing well, episode of dyspnea last night.  Appears a little hyperventilating, but says she is comfortable No significant opioid withdrawals as receiving morphine  REVIEW OF SYSTEMS:  Review of Systems  Constitutional: Positive for fever and malaise/fatigue. Negative for chills.  HENT: Positive for ear pain. Negative for ear discharge and tinnitus.   Respiratory: Positive for cough and shortness of breath. Negative for wheezing.   Cardiovascular: Positive for chest pain. Negative for palpitations.  Gastrointestinal: Negative for nausea, vomiting, abdominal pain, diarrhea and constipation.  Genitourinary: Negative for dysuria.  Neurological: Positive for weakness. Negative for dizziness, tingling, sensory change, speech change, seizures and headaches.  Psychiatric/Behavioral: Positive for substance abuse. Negative for depression and suicidal ideas. The patient is nervous/anxious.     DRUG ALLERGIES:   Allergies  Allergen Reactions  . Amoxicillin Rash  . Latex   . Vancomycin     VITALS:  Blood pressure 115/60, pulse 88, temperature 99.1 F (37.3 C), temperature source Oral, resp. rate 18, height 5\' 7"  (1.702 m), weight 58.968 kg (130 lb), last menstrual period 07/05/2015, SpO2 98 %, not currently breastfeeding.  PHYSICAL EXAMINATION:  Physical Exam  GENERAL: 34 y.o.-year-old patient lying in the bed with no acute distress.  Hyperventilation noted EYES: Pupils equal, round, reactive to light and accommodation. No scleral icterus. Extraocular muscles intact.  HEENT: Head atraumatic, normocephalic. Oropharynx and nasopharynx clear.  NECK: Supple, no jugular venous distention. No thyroid enlargement, no tenderness.  LUNGS: Normal breath sounds  bilaterally, no wheezing, or crepitation. Some right posterior rhonchi at the bases. No use of accessory muscles of respiration.  Pleuritic chest pain on deep inspiration. CARDIOVASCULAR: S1, S2 normal. No rubs, or gallops. 3/6 systolic murmur heard. ABDOMEN: Soft, nontender, nondistended. Bowel sounds present. No organomegaly or mass.  EXTREMITIES: No pedal edema, cyanosis, or clubbing. IV track marks noted on legs and hands. Right forearm site is is improved in erythema and swelling. NEUROLOGIC: Cranial nerves II through XII are intact. Muscle strength 5/5 in all extremities. Sensation intact. Gait not checked.  PSYCHIATRIC: The patient is alert and oriented x 3.  SKIN: IV track sites noted on the extremities, some of them elevated and red, especially the right forearm site appears infected.  LABORATORY PANEL:   CBC  Recent Labs Lab 07/11/15 0610  WBC 9.8  HGB 7.3*  HCT 22.8*  PLT 308   ------------------------------------------------------------------------------------------------------------------  Chemistries   Recent Labs Lab 07/05/15 1405  07/11/15 0610  NA 136  < > 138  K 3.1*  < > 4.1  CL 100*  < > 109  CO2 25  < > 25  GLUCOSE 164*  < > 112*  BUN 8  < > 10  CREATININE 0.67  < > 0.65  CALCIUM 8.7*  < > 8.0*  AST 24  --   --   ALT 15  --   --   ALKPHOS 81  --   --   BILITOT <0.1*  --   --   < > = values in this interval not displayed. ------------------------------------------------------------------------------------------------------------------  Cardiac Enzymes  Recent Labs Lab 07/10/15 0509  TROPONINI <0.03   ------------------------------------------------------------------------------------------------------------------  RADIOLOGY:  Dg Chest 2 View  07/10/2015   CLINICAL DATA:  Mid right-sided chest pain.  Shortness of breath. Symptoms for 5 days.  EXAM: CHEST  2 VIEW  COMPARISON:  Radiographs and CT 5 days prior 07/05/2015  FINDINGS: Worsening  right middle lobe aeration with developing opacity anteriorly. Tiny right pleural effusion. The left lung is clear. The heart size is normal. Pulmonary vasculature is normal. No pneumothorax. Surgical hardware in the lower cervical spine, partially included.  IMPRESSION: Worsening right middle lobe aeration with developing opacity anteriorly, concerning for new/progressive pneumonia.   Electronically Signed   By: Rubye Oaks M.D.   On: 07/10/2015 05:16   US Venous Img Upper Uni Right  07/10/2015   CLINICAL DATA:  Swelling involving the right arm. History of intravenous drug abuse. Evaluate for DVT.  EXAM: RIGHT UPPER EXTREMITY VENOUS DOPPLER ULTRASOUND  TECHNIQUE: Gray-scale sonography with graded compression, as well as color Doppler and duplex ultrasound were performed to evaluate the upper extremity deep venous system from the level of the subclavian vein and including the jugular, axillary, basilic, radial, ulnar and upper cephalic vein. Spectral Doppler was utilized to evaluate flow at rest and with distal augmentation maneuvers.  COMPARISON:  Right humerus radiographs -06/21/2008  FINDINGS: Contralateral Subclavian Vein: Respiratory phasicity is normal and symmetric with the symptomatic side. No evidence of thrombus. Normal compressibility.  Internal Jugular Vein: No evidence of thrombus. Normal compressibility, respiratory phasicity and response to augmentation.  Subclavian Vein: No evidence of thrombus. Normal compressibility, respiratory phasicity and response to augmentation.  Axillary Vein: No evidence of thrombus. Normal compressibility, respiratory phasicity and response to augmentation.  Cephalic Vein: No evidence of thrombus. Normal compressibility, respiratory phasicity and response to augmentation.  Basilic Vein: No evidence of thrombus. Normal compressibility, respiratory phasicity and response to augmentation.  Brachial Veins: No evidence of thrombus within either of the paired brachial  veins. Normal compressibility, respiratory phasicity and response to augmentation.  Radial Veins: No evidence of thrombus. Normal compressibility, respiratory phasicity and response to augmentation.  Ulnar Veins: No evidence of thrombus. Normal compressibility, respiratory phasicity and response to augmentation.  Venous Reflux:  None visualized.  Other Findings:  None visualized.  IMPRESSION: No evidence of DVT within the right upper extremity.   Electronically Signed   By: Simonne Come M.D.   On: 07/10/2015 10:16    EKG:   Orders placed or performed during the hospital encounter of 07/10/15  . ED EKG within 10 minutes  . ED EKG within 10 minutes  . EKG 12-Lead  . EKG 12-Lead    ASSESSMENT AND PLAN:   [name] is a 34 y.o. 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   #1 Sepsis: Secondary to pneumonia. White count is low due to being on antibiotics. -Fevers improved since admission, low grade temps still noted. -Blood cultures repeat ordered as continues to use IV drugs for 07/10/15. -Chest x-ray with worsening right middle lobe opacity. CT of the chest done 5 days ago did not show any PE and just confirms the right middle lobe pneumonia. - REPEAT CT CHEST TODAY -ID consult because of her complicated history. - new murmur on cardiac exam. We'll get an echocardiogram to rule out any endocarditis. -HIV negative. -changed antibiotics to rocephin and azithromycin. Clindamycin added by ID  #2 Forearm cellulitis: Secondary to IV drug abuse -On IV antibiotics. H/o of MRSA cellulitis and  abscess. - Clindamycin added - Await cultures.  #3 Hypokalemia: replaced  #4 IV heroin abuse: Counseled strongly. Urine drug screen with opioid , cocaine and benzo's. -On morphine for pain here, no significant  withdrawals symptoms so far - Counselled and says that she is thinking about a residential rehab program  #5 H/o DVT: DVT in right arm, noncompliant with use of anticoagulation as outpatient.  - Signifiicant anemia anyway - repeat doppler US with no clot - discontinue lovenox due to anemia -Not sure the clot is secondary to phlebitis from her IV drug use.  #6 Tobacco use disorder: Counseled against smoking for almost 3 minutes. -Not ready to quit. Refuses any nicotine replacements at this time.  #7 Hepatitis C-outpatient follow-up recommended:  #8 DVT prophylaxis- now that no clot on [place], discontinue lovenox and start TEDs, SCDs as anemic  #9 anemia-anemia of chronic disease. Recent pregnancy in July 2016.  Hemoglobin baseline is around 7. Recheck tomorrow Transfuse if <7   All the records are reviewed and case discussed with Care Management/Social Workerr. Management plans discussed with the patient, family and they are in agreement.  CODE STATUS: FULL CODE  TOTAL TIME TAKING CARE OF THIS PATIENT: 38 minutes.   POSSIBLE D/C IN 1-2 DAYS, DEPENDING ON CLINICAL CONDITION.   [name] M.D on 07/11/2015 at 12:18 PM  Between 7am to 6pm - Pager - [phone]  After 6pm go to [email or web address] - password [email or web address] [facility]  [facility]  Office  [phone]  CC: Primary care physician; No primary care provider on file.

## 2015-07-11 NOTE — Progress Notes (Signed)
Notified Dr Clint Guy of generalized rash to back, buttocks and legs-will hold antibiotics for now

## 2015-07-11 NOTE — Plan of Care (Signed)
Problem: Discharge Progression Outcomes Goal: Other Discharge Outcomes/Goals Outcome: Progressing Alert and oriented pt. Pt with multiple c/o chest and back pain. PRN medication given with little relief noted. Pt had episode when she stated she could not breathe. O2 sats remained in 90's. PRN breathing treatment given with relief noted. Pt with low grade temp during the shift. Pt got upset with staff because we would not allow her to go up to NICU to see her baby or go outside. AC in to talk to pt. Plan was made with pt to ambulate around the unit.

## 2015-07-11 NOTE — Progress Notes (Signed)
Informed by nursing staff patient has erythematous rash on back of note recently started on clindamycin today has received only one dose thus far is scheduled for the next's now. We'll hold clindamycin, add Benadryl for allergic reaction. Infectious disease is following will defer for further recommendation on antibiotic coverage at this time. Continue ceftriaxone/azithromycin

## 2015-07-11 NOTE — Progress Notes (Signed)
Pt A&O; remains on Moderate Fall Risks; oob to br without assist; PRN IV Morphine given for c/o acute chest pain radiating to her back with relief; CT Chest without Contrast performed per MD order; refuses to wear TEDs and SCDs; peripheral IV remains in right foot

## 2015-07-12 ENCOUNTER — Inpatient Hospital Stay: Payer: Medicaid Other

## 2015-07-12 LAB — BASIC METABOLIC PANEL
ANION GAP: 6 (ref 5–15)
BUN: 7 mg/dL (ref 6–20)
CALCIUM: 8.3 mg/dL — AB (ref 8.9–10.3)
CO2: 23 mmol/L (ref 22–32)
Chloride: 106 mmol/L (ref 101–111)
Creatinine, Ser: 0.77 mg/dL (ref 0.44–1.00)
GFR calc Af Amer: >60 mL/min (ref >60)
GFR calc non Af Amer: >60 mL/min (ref >60)
GLUCOSE: 102 mg/dL — AB (ref 65–99)
POTASSIUM: 4.5 mmol/L (ref 3.5–5.1)
Sodium: 135 mmol/L (ref 135–145)

## 2015-07-12 LAB — CBC
HEMATOCRIT: 22.5 % — AB (ref 35.0–47.0)
Hemoglobin: 7.3 g/dL — ABNORMAL LOW (ref 12.0–16.0)
MCH: 26.6 pg (ref 26.0–34.0)
MCHC: 32.3 g/dL (ref 32.0–36.0)
MCV: 82.4 fL (ref 80.0–100.0)
Platelets: 353 10*3/uL (ref 150–440)
RBC: 2.73 MIL/uL — ABNORMAL LOW (ref 3.80–5.20)
RDW: 16 % — AB (ref 11.5–14.5)
WBC: 8.8 10*3/uL (ref 3.6–11.0)

## 2015-07-12 MED ORDER — MORPHINE SULFATE (PF) 4 MG/ML IV SOLN
4.0000 mg | INTRAVENOUS | Status: DC | PRN
  Administered 2015-07-12: 4 mg via INTRAMUSCULAR
  Filled 2015-07-12: qty 1

## 2015-07-12 MED ORDER — METHADONE HCL 5 MG PO TABS
10.0000 mg | ORAL_TABLET | Freq: Two times a day (BID) | ORAL | Status: DC
  Administered 2015-07-12: 10 mg via ORAL
  Filled 2015-07-12: qty 2

## 2015-07-12 MED ORDER — SULFAMETHOXAZOLE-TRIMETHOPRIM 800-160 MG PO TABS
1.0000 | ORAL_TABLET | Freq: Two times a day (BID) | ORAL | Status: DC
  Administered 2015-07-12: 19:00:00 1 via ORAL
  Filled 2015-07-12: qty 1

## 2015-07-12 MED ORDER — CEFPODOXIME PROXETIL 200 MG PO TABS
200.0000 mg | ORAL_TABLET | Freq: Two times a day (BID) | ORAL | Status: DC
  Filled 2015-07-12: qty 1

## 2015-07-12 NOTE — Progress Notes (Signed)
Pt left AMA. I spoke with pt at great length to try to persuade her to stay. Nursing Supervisor also came to speak with pt. It was explained to pt that insurance would not cover her visit if she left. MD notified. Pt left floor @ 20:35 with significant other. Pt had no IV access to remove.

## 2015-07-12 NOTE — Care Management (Signed)
Admitted to [facility] with the diagnosis of sepsis. Lives with her mother, Georganna Skeans, [phone]). States she last seen Dr. Dossie Arbour when she was in [place].  Information on physician's accepting new patients given to Ms. Palermo. Encouraged her to call for follow-up physician care. Discussed possibly Dr. Dossie Arbour again.  Gwenette Greet RN MSN Care Management [phone]

## 2015-07-12 NOTE — Plan of Care (Signed)
Problem: Discharge Progression Outcomes Goal: Other Discharge Outcomes/Goals Plan of care progress to goal: O2: pt on room air Pain: requesting med every 4 hours Hemodynamically: VSS Diet: tolerating well Activity: independent Rash to back and legs after starting cleocin-given benadryl

## 2015-07-12 NOTE — Progress Notes (Signed)
[facility] - [place] at [facility]   PATIENT NAME: [name]    MR#:  [id number]  DATE OF BIRTH:  Feb 18, 1981  SUBJECTIVE:  CHIEF COMPLAINT:   Chief Complaint  Patient presents with  . Pleurisy   - low grade temp just today 100.31F - appears well. Some improvement of pleuritic chest pain  REVIEW OF SYSTEMS:  Review of Systems  Constitutional: Positive for fever and malaise/fatigue. Negative for chills.  HENT: Negative for ear discharge, ear pain and tinnitus.   Respiratory: Positive for cough. Negative for shortness of breath and wheezing.   Cardiovascular: Positive for chest pain. Negative for palpitations.  Gastrointestinal: Negative for nausea, vomiting, abdominal pain, diarrhea and constipation.  Genitourinary: Negative for dysuria.  Neurological: Positive for weakness. Negative for dizziness, tingling, sensory change, speech change, seizures and headaches.  Psychiatric/Behavioral: Positive for substance abuse. Negative for depression and suicidal ideas. The patient is nervous/anxious.     DRUG ALLERGIES:   Allergies  Allergen Reactions  . Amoxicillin Rash  . Latex   . Vancomycin     VITALS:  Blood pressure 132/75, pulse 94, temperature 100.6 F (38.1 C), temperature source Oral, resp. rate 20, height 5\' 7"  (1.702 m), weight 58.968 kg (130 lb), last menstrual period 07/05/2015, SpO2 100 %, not currently breastfeeding.  PHYSICAL EXAMINATION:  Physical Exam  GENERAL: 34 y.o.-year-old patient lying in the bed with no acute distress.  Calm and comfortable today EYES: Pupils equal, round, reactive to light and accommodation. No scleral icterus. Extraocular muscles intact.  HEENT: Head atraumatic, normocephalic. Oropharynx and nasopharynx clear.  NECK: Supple, no jugular venous distention. No thyroid enlargement, no tenderness.  LUNGS: Normal breath sounds bilaterally, no wheezing, or crepitation. Some right posterior rhonchi at the  bases. No use of accessory muscles of respiration.  Pleuritic chest pain on deep inspiration. CARDIOVASCULAR: S1, S2 normal. No rubs, or gallops. 3/6 systolic murmur heard. ABDOMEN: Soft, nontender, nondistended. Bowel sounds present. No organomegaly or mass.  EXTREMITIES: No pedal edema, cyanosis, or clubbing. IV track marks noted on legs and hands. Right forearm site is is improved in erythema and swelling. NEUROLOGIC: Cranial nerves II through XII are intact. Muscle strength 5/5 in all extremities. Sensation intact. Gait not checked.  PSYCHIATRIC: The patient is alert and oriented x 3.  SKIN: IV track sites noted on the extremities.  LABORATORY PANEL:   CBC  Recent Labs Lab 07/12/15 0556  WBC 8.8  HGB 7.3*  HCT 22.5*  PLT 353   ------------------------------------------------------------------------------------------------------------------  Chemistries   Recent Labs Lab 07/12/15 0556  NA 135  K 4.5  CL 106  CO2 23  GLUCOSE 102*  BUN 7  CREATININE 0.77  CALCIUM 8.3*   ------------------------------------------------------------------------------------------------------------------  Cardiac Enzymes  Recent Labs Lab 07/10/15 0509  TROPONINI <0.03   ------------------------------------------------------------------------------------------------------------------  RADIOLOGY:  Dg Chest 2 View  07/12/2015   CLINICAL DATA:  Followup pneumonia.  History of asthma.  EXAM: CHEST  2 VIEW  COMPARISON:  07/10/2015  FINDINGS: Normal heart size. There is no pleural effusion or edema identified. Bilateral pleural effusions are identified right greater in left. When compared with the previous exam the right pleural effusion has increased in volume. There is worsening aeration to the right middle lobe and right lower lobe.  IMPRESSION: 1. Increase in volume of right pleural effusion with overlying airspace consolidation and atelectasis.   Electronically Signed   By: Signa Kell M.D.   On: 07/12/2015 10:08   Ct Chest Wo Contrast  07/11/2015   CLINICAL DATA:  Pneumonia.  Hepatitis-C.  IV drug abuse.  EXAM: CT CHEST WITHOUT CONTRAST  TECHNIQUE: Multidetector CT imaging of the chest was performed following the standard protocol without IV contrast.  COMPARISON:  07/05/2015 CT.  Plain film of 1 day prior.  Is  FINDINGS: Mediastinum/Nodes: Multiple small left axillary nodes are likely reactive and are similar. Heart size accentuated by a pectus excavatum deformity. No pericardial effusion. Limited evaluation for thoracic adenopathy secondary to motion and lack of IV contrast.  Lungs/Pleura: Increase in size of a small right pleural effusion. Increase in anterior right-sided pleural thickening versus minimal loculated fluid. Example image 33 of series 2. Mild motion degradation. Since the prior CT, development of inferior right lower lobe consolidation with air bronchograms. Dependent right middle lobe airspace disease also identified. No areas of necrosis or evidence of septic emboli.  4 mm left upper lobe pulmonary nodule on image 18 is likely new since the prior exam. Similar findings more inferiorly in the left upper lobe on image 25 of series 3.  Upper abdomen: Normal imaged portions of the liver, spleen, stomach, adrenal glands, left kidney.  Musculoskeletal: Lower cervical spine fixation.  IMPRESSION: 1. Significantly increased right lower lobe and new right middle lobe opacity since 07/05/2015, most consistent with pneumonia. Increase in small right pleural effusion with anterior right pleural thickening versus trace loculated fluid, likely secondary. 2. Motion degraded exam. 3. Minimal left upper lobe nodularity is felt to be new since the recent exam and therefore likely infectious or inflammatory as well.   Electronically Signed   By: Jeronimo Greaves M.D.   On: 07/11/2015 13:04    EKG:   Orders placed or performed during the hospital encounter of 07/10/15  . ED EKG within  10 minutes  . ED EKG within 10 minutes  . EKG 12-Lead  . EKG 12-Lead    ASSESSMENT AND PLAN:   [name] is a 34 y.o. 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   #1 Sepsis: Secondary to worsening pneumonia. White count is low due to being on antibiotics. - low grade temps still noted. -Blood cultures repeat ordered and negative so far - Sputum cultures pending - ABX changed to oral cefpodoxime (3rd generation cephalosporin) and oral bactrim. Patient had rash with clindamycin. -CT chest with small right pleural effusion, worsened RML and RLL infiltrates from last week - If no further fevers- can be discharged tomorrow on oral ABX, if febrile- then will get a Lateral decub XRay to see if any effusion present to be tapped. -Appreciate ID consult - echocardiogram with no vegetations. -HIV negative.  #2 Forearm cellulitis: Secondary to IV drug abuse -On antibiotics. H/o of MRSA cellulitis and abscess. - on bactrim now, blood cultures are negative. Much improved clinically  #3 Hypokalemia: replaced  #4 IV heroin abuse: Counseled strongly. Urine drug screen with opioid , cocaine and benzo's. -On morphine for pain here, no significant withdrawals symptoms so far - Counselled and says that she is thinking about a residential rehab program  #5 H/o DVT: DVT in right arm, noncompliant with use of anticoagulation as outpatient.  - Significant anemia anyway - repeat doppler US with no clot - discontinue lovenox due to anemia -Not sure the clot is secondary to phlebitis from her IV  drug use.  #6 Tobacco use disorder: Counseled against smoking for almost 3 minutes. -Not ready to quit. Refuses any nicotine replacements at this time.  #7 Hepatitis C-outpatient follow-up recommended:  #8 DVT  prophylaxis- now that no clot on [place], discontinue lovenox and start TEDs, SCDs as anemic  #9 anemia-anemia of chronic disease. Recent pregnancy in July 2016.  Hemoglobin baseline is around 7. So now stable at 7.3 Transfuse if <7   All the records are reviewed and case discussed with Care Management/Social Workerr. Management plans discussed with the patient, family and they are in agreement.  CODE STATUS: FULL CODE  TOTAL TIME TAKING CARE OF THIS PATIENT: 38 minutes.   POSSIBLE D/C IN 1-2 DAYS, DEPENDING ON CLINICAL CONDITION.   [name] M.D on 07/12/2015 at 2:29 PM  Between 7am to 6pm - Pager - [phone]  After 6pm go to [email or web address] - password [email or web address] [facility]  [place] [facility]  Office  [phone]  CC: Primary care physician; No primary care provider on file.

## 2015-07-12 NOTE — Progress Notes (Addendum)
[facility] CLINIC INFECTIOUS DISEASE PROGRESS NOTE Date of Admission:  07/10/2015     ID: [name] is a 34 y.o. female with  PNA, IVDU  Active Problems:   Sepsis   Subjective: Still with R chest pain, low grade fever 100.6 but reports felling a little better CT reviewed. Had rash last pm - possibly from clinda so held Has lost IV access  ROS  Eleven systems are reviewed and negative except per hpi  Medications:  Antibiotics Given (last 72 hours)    Date/Time Action Medication Dose Rate   07/11/15 0946 Given   cefTRIAXone (ROCEPHIN) 2 g in dextrose 5 % 50 mL IVPB 2 g 100 mL/hr   07/11/15 1022 Given   azithromycin (ZITHROMAX) 500 mg in dextrose 5 % 250 mL IVPB 500 mg 250 mL/hr   07/11/15 1304 Given   clindamycin (CLEOCIN) IVPB 600 mg 600 mg 100 mL/hr   07/12/15 0957 Given   azithromycin (ZITHROMAX) 500 mg in dextrose 5 % 250 mL IVPB 500 mg 250 mL/hr   07/12/15 0957 Given   cefTRIAXone (ROCEPHIN) 2 g in dextrose 5 % 50 mL IVPB 2 g 100 mL/hr     . azithromycin  500 mg Intravenous Q24H  . cefTRIAXone (ROCEPHIN)  IV  2 g Intravenous Q24H  . ferrous sulfate  325 mg Oral TID WC  . multivitamin-prenatal  1 tablet Oral Q1200  . sodium chloride  3 mL Intravenous Q12H    Objective: Vital signs in last 24 hours: Temp:  [98.7 F (37.1 C)-100.6 F (38.1 C)] 100.6 F (38.1 C) (08/29 1351) Pulse Rate:  [89-99] 94 (08/29 1351) Resp:  [20] 20 (08/29 0521) BP: (110-132)/(53-75) 132/75 mmHg (08/29 1351) SpO2:  [94 %-100 %] 100 % (08/29 1351)  Constitutional: oriented to person, place, and time. Lethargic apperaing, chronically ill apperaing. Older than stated age HENT: [place]/AT, PERRLA, no scleral icterus Mouth/Throat: Oropharynx is clear and moist. No oropharyngeal exudate.  Cardiovascular: Normal rate, regular rhythm and normal heart sounds. Exam reveals no gallop and no friction rub.  No murmur heard.  Pulmonary/Chest: Rhonchi R  Neck supple, no nuchal  rigidity Abdominal: Soft. Bowel sounds are normal. exhibits no distension. There is no tenderness.  Lymphadenopathy: no cervical adenopathy. No axillary adenopathy Neurological: alert and oriented to person, place, and time.  Skin: R foreamr with area of induration and rendess which is ttp  Psychiatric:somewhat lethargic, flat affect, asking for pain meds.  Lab Results  Recent Labs  07/11/15 0610 07/12/15 0556  WBC 9.8 8.8  HGB 7.3* 7.3*  HCT 22.8* 22.5*  NA 138 135  K 4.1 4.5  CL 109 106  CO2 25 23  BUN 10 7  CREATININE 0.65 0.77    Microbiology:  Results for orders placed or performed during the hospital encounter of 07/10/15  Culture, blood (routine x 2)     Status: None (Preliminary result)   Collection Time: 07/10/15  8:24 AM  Result Value Ref Range Status   Specimen Description BLOOD RIGHT ANKLE  Final   Special Requests   Final    BOTTLES DRAWN AEROBIC AND ANAEROBIC  AER 4CC ANA 2CC   Culture NO GROWTH 2 DAYS  Final   Report Status PENDING  Incomplete  Culture, blood (routine x 2)     Status: None (Preliminary result)   Collection Time: 07/10/15 10:52 AM  Result Value Ref Range Status   Specimen Description BLOOD RIGHT ASSIST CONTROL  Final   Special Requests BOTTLES DRAWN AEROBIC  AND ANAEROBIC  5CC  Final   Culture NO GROWTH 2 DAYS  Final   Report Status PENDING  Incomplete  Culture, expectorated sputum-assessment     Status: None   Collection Time: 07/11/15  9:33 PM  Result Value Ref Range Status   Specimen Description SPUTUM  Final   Special Requests Normal  Final   Sputum evaluation THIS SPECIMEN IS ACCEPTABLE FOR SPUTUM CULTURE  Final   Report Status 07/11/2015 FINAL  Final  Culture, respiratory (NON-Expectorated)     Status: None (Preliminary result)   Collection Time: 07/11/15  9:33 PM  Result Value Ref Range Status   Specimen Description SPUTUM  Final   Special Requests Normal Reflexed from [id number]  Final   Gram Stain PENDING  Incomplete    Culture NO GROWTH < 12 HOURS  Final   Report Status PENDING  Incomplete    Studies/Results: Dg Chest 2 View  07/12/2015   CLINICAL DATA:  Followup pneumonia.  History of asthma.  EXAM: CHEST  2 VIEW  COMPARISON:  07/10/2015  FINDINGS: Normal heart size. There is no pleural effusion or edema identified. Bilateral pleural effusions are identified right greater in left. When compared with the previous exam the right pleural effusion has increased in volume. There is worsening aeration to the right middle lobe and right lower lobe.  IMPRESSION: 1. Increase in volume of right pleural effusion with overlying airspace consolidation and atelectasis.   Electronically Signed   By: Signa Kell M.D.   On: 07/12/2015 10:08   Ct Chest Wo Contrast  07/11/2015   CLINICAL DATA:  Pneumonia.  Hepatitis-C.  IV drug abuse.  EXAM: CT CHEST WITHOUT CONTRAST  TECHNIQUE: Multidetector CT imaging of the chest was performed following the standard protocol without IV contrast.  COMPARISON:  07/05/2015 CT.  Plain film of 1 day prior.  Is  FINDINGS: Mediastinum/Nodes: Multiple small left axillary nodes are likely reactive and are similar. Heart size accentuated by a pectus excavatum deformity. No pericardial effusion. Limited evaluation for thoracic adenopathy secondary to motion and lack of IV contrast.  Lungs/Pleura: Increase in size of a small right pleural effusion. Increase in anterior right-sided pleural thickening versus minimal loculated fluid. Example image 33 of series 2. Mild motion degradation. Since the prior CT, development of inferior right lower lobe consolidation with air bronchograms. Dependent right middle lobe airspace disease also identified. No areas of necrosis or evidence of septic emboli.  4 mm left upper lobe pulmonary nodule on image 18 is likely new since the prior exam. Similar findings more inferiorly in the left upper lobe on image 25 of series 3.  Upper abdomen: Normal imaged portions of the liver,  spleen, stomach, adrenal glands, left kidney.  Musculoskeletal: Lower cervical spine fixation.  IMPRESSION: 1. Significantly increased right lower lobe and new right middle lobe opacity since 07/05/2015, most consistent with pneumonia. Increase in small right pleural effusion with anterior right pleural thickening versus trace loculated fluid, likely secondary. 2. Motion degraded exam. 3. Minimal left upper lobe nodularity is felt to be new since the recent exam and therefore likely infectious or inflammatory as well.   Electronically Signed   By: Jeronimo Greaves M.D.   On: 07/11/2015 13:04   Assessment/Plan: [name] is a 34 y.o. female with Hep C, IVDU, prior MRSA abscesses, prior RUE DVT admitted with progressive R sided cp and R ant infiltrate on CXR. She had CT chest 8.22 with no PE. She does have a murmur on exam (  reports having one since age 29) and echo is pending. Has infection at injection site on R arm but neg for DVT there.  HIV is negative Echo neg for vegetations. BCX neg to date.  Sputum cx done 8/28 negative so far.  CT shows impressive consolidation and some pleural effusion.   She is allergic to pcn and vanco. She was on ceftriaxone and azithromycin and we added clinda but she seems to have had a rash to it.  ABX Ceftraixone 8/26- current Azithro 500 8/27 - Current Clindmycin x 1 does -> developed rash  Recommendations Pt has lost IV - will change ceftraixone to cefpodoxime 200 bid oral. She has had 3 days of azithro which is adequate so can stop Add bactrim to cover possible MRSA given rash with clinda -  Sputum pending - would await final culture results prior to DC home Observe on orals for next 24 hours If worsens would consider thoracentesis to eval for empyema  Thank you very much for the consult. Will follow with you.  Duncan Alejandro   07/12/2015, 2:03 PM

## 2015-07-13 ENCOUNTER — Emergency Department
Admission: EM | Admit: 2015-07-13 | Discharge: 2015-07-13 | Disposition: A | Discharge status: Home / Self Care | Payer: Medicaid Other | Attending: Emergency Medicine | Admitting: Emergency Medicine

## 2015-07-13 ENCOUNTER — Encounter: Payer: Self-pay | Admitting: Emergency Medicine

## 2015-07-13 DIAGNOSIS — Z880 Allergy status to penicillin: Secondary | ICD-10-CM | POA: Diagnosis not present

## 2015-07-13 DIAGNOSIS — Z91040 Latex allergy status: Secondary | ICD-10-CM | POA: Insufficient documentation

## 2015-07-13 DIAGNOSIS — Z79899 Other long term (current) drug therapy: Secondary | ICD-10-CM | POA: Diagnosis not present

## 2015-07-13 DIAGNOSIS — Z760 Encounter for issue of repeat prescription: Secondary | ICD-10-CM | POA: Diagnosis present

## 2015-07-13 DIAGNOSIS — J189 Pneumonia, unspecified organism: Secondary | ICD-10-CM

## 2015-07-13 DIAGNOSIS — J159 Unspecified bacterial pneumonia: Secondary | ICD-10-CM | POA: Diagnosis not present

## 2015-07-13 LAB — CULTURE, RESPIRATORY W GRAM STAIN: Special Requests: NORMAL

## 2015-07-13 LAB — CULTURE, RESPIRATORY: CULTURE: NORMAL

## 2015-07-13 MED ORDER — SULFAMETHOXAZOLE-TRIMETHOPRIM 800-160 MG PO TABS: 1.0000 | ORAL_TABLET | Freq: Two times a day (BID) | ORAL | Status: DC

## 2015-07-13 MED ORDER — CEFPODOXIME PROXETIL 200 MG PO TABS: 200.0000 mg | ORAL_TABLET | Freq: Two times a day (BID) | ORAL | Status: DC

## 2015-07-13 MED ORDER — RANITIDINE HCL 150 MG PO TABS: 150.0000 mg | ORAL_TABLET | Freq: Two times a day (BID) | ORAL | Status: DC

## 2015-07-13 NOTE — Discharge Summary (Signed)
[facility] [place] [facility]   PATIENT NAME: [name]    MR#:  [id number]  DATE OF BIRTH:  09-Nov-1981  DATE OF ADMISSION:  07/10/2015 ADMITTING PHYSICIAN: Enid Baas, MD  DATE OF DISCHARGE: 07/12/2015  8:35 PM  Patient left AGAINST MEDICAL ADVICE  PRIMARY CARE PHYSICIAN: No primary care provider on file.    ADMISSION DIAGNOSIS:  Swelling [R60.9] Community acquired pneumonia [J18.9]  DISCHARGE DIAGNOSIS:  Active Problems:   Sepsis   SECONDARY DIAGNOSIS:   Past Medical History  Diagnosis Date  . HCV (hepatitis C virus) 07/17/2013    Last Assessment & Plan:  - positive antibody most recently 08/2014 - hep C RNA 08/2014 was negative - prior to that, HCV RNA [id number] in 06/2013. No record for genotype or imaging.   - patient counseled that she could become re-infected with continued IVDU or unprotected sexual encounters.   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   Last Assessment & Plan:  Discussed routine use of Valtrex prophylaxis at 36 weeks. She will be delivered by cesarean at 36-37 weeks.   . Infection with methicillin-resistant Staphylococcus aureus 03/19/2013    Overview: Arm abscess with MRSA, treated.  Subsequent screening negative  . Tenosynovitis 08/19/2014  . Acute deep vein thrombosis of arm 08/19/2014    Last Assessment & Plan:  - diagnosed 08/2014 in hospital - s/p heparin gtt - patient never took her xarelto x 3 month course that was planned - no symptoms today.  No further treatment - we discussed avoiding IVDU (see below) to reduce her chances of provoked upper extremity DVT   . Asthma   . IV drug abuse     HOSPITAL COURSE:   [name] is a 34 y.o. 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   #1 Sepsis: Secondary to worsening pneumonia. White count is low due to being on antibiotics. - low grade temps still noted. -Blood cultures repeat ordered and negative so far - Sputum cultures pending - ABX changed to oral cefpodoxime (3rd generation cephalosporin) and oral bactrim. Patient had rash with clindamycin. -CT chest with small right pleural effusion, worsened RML and RLL infiltrates from last week - If no further fevers- can be discharged tomorrow on oral ABX, if febrile- then will get a Lateral decub XRay to see if any effusion present to be tapped. -Appreciate ID consult - echocardiogram with no vegetations. -HIV negative.  #2 Forearm cellulitis: Secondary to IV drug abuse -On antibiotics. H/o of MRSA cellulitis and abscess. - on bactrim now, blood cultures are negative. Much improved clinically  #3 Hypokalemia: replaced  #4 IV heroin abuse: Counseled strongly. Urine drug screen with opioid , cocaine and benzo's. -On morphine for pain here, no significant withdrawals symptoms so far - Counselled and says that she is thinking about a residential rehab program  #5 H/o DVT: DVT in right arm, noncompliant with use of anticoagulation as outpatient.  - Significant anemia anyway - repeat doppler US with no clot - discontinue lovenox due to anemia -Not sure the clot is secondary to phlebitis from her IV drug use.  #6 Tobacco use disorder:-Not ready to quit. Refuses any nicotine replacements at this time.  #7 Hepatitis C-outpatient follow-up recommended:  #  9 anemia-anemia of chronic disease. Recent pregnancy in July 2016.  Hemoglobin baseline is around 7. So now stable at 7.3  On 07/12/2015, patient wanted to go outside and then the nurse objected patient wanted to leave AGAINST MEDICAL ADVICE. Floor RN and also nursing supervisor and on-call physician were notified, they counseled the patient but she still didn't want to stay. So she left AGAINST MEDICAL  ADVICE. Off note patient came to the emergency room on 07/13/2015 for antibiotics prescriptions-advised to dispense cefpodoxime and oral Bactrim for 10 more days.  DISCHARGE CONDITIONS:  Guarded  CONSULTS OBTAINED:  Treatment Team:  Clydie Braun, MD  DRUG ALLERGIES:   Allergies  Allergen Reactions  . Amoxicillin Rash  . Latex   . Vancomycin   . Clindamycin/Lincomycin Rash    DISCHARGE MEDICATIONS:   Discharge Medication List as of 07/12/2015  8:37 PM    CONTINUE these medications which have NOT CHANGED   Details  levofloxacin (LEVAQUIN) 750 MG tablet Take 1 tablet (750 mg total) by mouth daily., Starting 07/05/2015, Until Mon 07/12/15, Print    docusate sodium (COLACE) 100 MG capsule Take 1 capsule (100 mg total) by mouth 2 (two) times daily as needed., Starting 05/21/2015, Until Discontinued, Print    enoxaparin (LOVENOX) 40 MG/0.4ML injection Inject 0.4 mLs (40 mg total) into the skin daily., Starting 05/21/2015, Until Discontinued, Print    ferrous sulfate 325 (65 FE) MG tablet Take 1 tablet (325 mg total) by mouth 3 (three) times daily with meals., Starting 05/21/2015, Until Discontinued, Print    ibuprofen (ADVIL,MOTRIN) 800 MG tablet Take 1 tablet (800 mg total) by mouth every 8 (eight) hours as needed., Starting 05/21/2015, Until Discontinued, Print    lidocaine (LIDODERM) 5 % Place 1 patch onto the skin daily. Remove & Discard patch within 12 hours or as directed by MD, Starting 05/21/2015, Until Discontinued, Print    oxyCODONE-acetaminophen (PERCOCET/ROXICET) 5-325 MG per tablet Take 1-2 tablets by mouth every 6 (six) hours as needed for severe pain (for pain scale greater than 7)., Starting 05/21/2015, Until Discontinued, Print    Prenatal Vit-Fe Fumarate-FA (MULTIVITAMIN-PRENATAL) 27-0.8 MG TABS tablet Take 1 tablet by mouth daily at 12 noon., Until Discontinued, Historical Med         DISCHARGE INSTRUCTIONS:   Follow up at the urgent care for antibiotics  prescriptions  If you experience worsening of your admission symptoms, develop shortness of breath, life threatening emergency, suicidal or homicidal thoughts you must seek medical attention immediately by calling 911 or calling your MD immediately  if symptoms less severe.  You Must read complete instructions/literature along with all the possible adverse reactions/side effects for all the Medicines you take and that have been prescribed to you. Take any new Medicines after you have completely understood and accept all the possible adverse reactions/side effects.   Please note  You were cared for by a hospitalist during your hospital stay. If you have any questions about your discharge medications or the care you received while you were in the hospital after you are discharged, you can call the unit and asked to speak with the hospitalist on call if the hospitalist that took care of you is not available. Once you are discharged, your primary care physician will handle any further medical issues. Please note that NO REFILLS for any discharge medications will be authorized once you are discharged, as it is imperative that you return to your primary care physician (or establish a relationship with a primary care physician  if you do not have one) for your aftercare needs so that they can reassess your need for medications and monitor your lab values.    Today   CHIEF COMPLAINT:   Chief Complaint  Patient presents with  . Pleurisy    VITAL SIGNS:  Blood pressure 132/75, pulse 94, temperature 100.6 F (38.1 C), temperature source Oral, resp. rate 20, height 5\' 7"  (1.702 m), weight 58.968 kg (130 lb), last menstrual period 07/05/2015, SpO2 100 %, not currently breastfeeding.  I/O:   Intake/Output Summary (Last 24 hours) at 07/13/15 1520 Last data filed at 07/12/15 1900  Gross per 24 hour  Intake    240 ml  Output      0 ml  Net    240 ml    PHYSICAL EXAMINATION:   Her exam on the  morning of discharge:  Physical Exam  GENERAL: 34 y.o.-year-old patient lying in the bed with no acute distress.  Calm and comfortable today EYES: Pupils equal, round, reactive to light and accommodation. No scleral icterus. Extraocular muscles intact.  HEENT: Head atraumatic, normocephalic. Oropharynx and nasopharynx clear.  NECK: Supple, no jugular venous distention. No thyroid enlargement, no tenderness.  LUNGS: Normal breath sounds bilaterally, no wheezing, or crepitation. Some right posterior rhonchi at the bases. No use of accessory muscles of respiration.  Pleuritic chest pain on deep inspiration. CARDIOVASCULAR: S1, S2 normal. No rubs, or gallops. 3/6 systolic murmur heard. ABDOMEN: Soft, nontender, nondistended. Bowel sounds present. No organomegaly or mass.  EXTREMITIES: No pedal edema, cyanosis, or clubbing. IV track marks noted on legs and hands. Right forearm site is is improved in erythema and swelling. NEUROLOGIC: Cranial nerves II through XII are intact. Muscle strength 5/5 in all extremities. Sensation intact. Gait not checked.  PSYCHIATRIC: The patient is alert and oriented x 3.  SKIN: IV track sites noted on the extremities.  DATA REVIEW:   CBC  Recent Labs Lab 07/12/15 0556  WBC 8.8  HGB 7.3*  HCT 22.5*  PLT 353    Chemistries   Recent Labs Lab 07/12/15 0556  NA 135  K 4.5  CL 106  CO2 23  GLUCOSE 102*  BUN 7  CREATININE 0.77  CALCIUM 8.3*    Cardiac Enzymes  Recent Labs Lab 07/10/15 0509  TROPONINI <0.03    Microbiology Results  Results for orders placed or performed during the hospital encounter of 07/10/15  Culture, blood (routine x 2)     Status: None (Preliminary result)   Collection Time: 07/10/15  8:24 AM  Result Value Ref Range Status   Specimen Description BLOOD RIGHT ANKLE  Final   Special Requests   Final    BOTTLES DRAWN AEROBIC AND ANAEROBIC  AER 4CC ANA 2CC   Culture NO GROWTH 3 DAYS  Final   Report Status  PENDING  Incomplete  Culture, blood (routine x 2)     Status: None (Preliminary result)   Collection Time: 07/10/15 10:52 AM  Result Value Ref Range Status   Specimen Description BLOOD RIGHT ASSIST CONTROL  Final   Special Requests BOTTLES DRAWN AEROBIC AND ANAEROBIC  5CC  Final   Culture NO GROWTH 3 DAYS  Final   Report Status PENDING  Incomplete  Culture, expectorated sputum-assessment     Status: None   Collection Time: 07/11/15  9:33 PM  Result Value Ref Range Status   Specimen Description SPUTUM  Final   Special Requests Normal  Final   Sputum evaluation THIS SPECIMEN IS ACCEPTABLE FOR SPUTUM  CULTURE  Final   Report Status 07/11/2015 FINAL  Final  Culture, respiratory (NON-Expectorated)     Status: None   Collection Time: 07/11/15  9:33 PM  Result Value Ref Range Status   Specimen Description SPUTUM  Final   Special Requests Normal Reflexed from [id number]  Final   Gram Stain   Final    EXCELLENT SPECIMEN - 90-100% WBCS MANY WBC SEEN RARE GRAM POSITIVE COCCI    Culture APPEARS TO BE NORMAL FLORA  Final   Report Status 07/13/2015 FINAL  Final    RADIOLOGY:  Dg Chest 2 View  07/12/2015   CLINICAL DATA:  Followup pneumonia.  History of asthma.  EXAM: CHEST  2 VIEW  COMPARISON:  07/10/2015  FINDINGS: Normal heart size. There is no pleural effusion or edema identified. Bilateral pleural effusions are identified right greater in left. When compared with the previous exam the right pleural effusion has increased in volume. There is worsening aeration to the right middle lobe and right lower lobe.  IMPRESSION: 1. Increase in volume of right pleural effusion with overlying airspace consolidation and atelectasis.   Electronically Signed   By: Signa Kell M.D.   On: 07/12/2015 10:08    EKG:   Orders placed or performed during the hospital encounter of 07/10/15  . ED EKG within 10 minutes  . ED EKG within 10 minutes  . EKG 12-Lead  . EKG 12-Lead  . EKG      Management plans  discussed with the patient, family and they are in agreement.  CODE STATUS:   TOTAL TIME TAKING CARE OF THIS PATIENT: 38 minutes.    Kinsley Nicklaus M.D on 07/13/2015 at 3:20 PM  Between 7am to 6pm - Pager - [phone]  After 6pm go to [email or web address] - password [email or web address] [facility]  [facility]  Office  [phone]  CC: Primary care physician; No primary care provider on file.

## 2015-07-13 NOTE — ED Notes (Signed)
This RN spoke with charge nurse about paging Dr Nemiah Commander to see if she would proceed with giving pt her RX. Kalisetti paged and waiting for return call. Pt out in lobby. No distress noted.

## 2015-07-13 NOTE — ED Notes (Signed)
Pt just left last night ama from the floor after being admitted for PNE. Pt returns today and would like her perscriptions that she was going to receive at time of discharge.

## 2015-07-13 NOTE — Discharge Instructions (Signed)
Pneumonia °Pneumonia is an infection of the lungs.  °CAUSES °Pneumonia may be caused by bacteria or a virus. Usually, these infections are caused by breathing infectious particles into the lungs (respiratory tract). °SIGNS AND SYMPTOMS  °· Cough. °· Fever. °· Chest pain. °· Increased rate of breathing. °· Wheezing. °· Mucus production. °DIAGNOSIS  °If you have the common symptoms of pneumonia, your health care provider will typically confirm the diagnosis with a chest X-ray. The X-ray will show an abnormality in the lung (pulmonary infiltrate) if you have pneumonia. Other tests of your blood, urine, or sputum may be done to find the specific cause of your pneumonia. Your health care provider may also do tests (blood gases or pulse oximetry) to see how well your lungs are working. °TREATMENT  °Some forms of pneumonia may be spread to other people when you cough or sneeze. You may be asked to wear a mask before and during your exam. Pneumonia that is caused by bacteria is treated with antibiotic medicine. Pneumonia that is caused by the influenza virus may be treated with an antiviral medicine. Most other viral infections must run their course. These infections will not respond to antibiotics.  °HOME CARE INSTRUCTIONS  °· Cough suppressants may be used if you are losing too much rest. However, coughing protects you by clearing your lungs. You should avoid using cough suppressants if you can. °· Your health care provider may have prescribed medicine if he or she thinks your pneumonia is caused by bacteria or influenza. Finish your medicine even if you start to feel better. °· Your health care provider may also prescribe an expectorant. This loosens the mucus to be coughed up. °· Take medicines only as directed by your health care provider. °· Do not smoke. Smoking is a common cause of bronchitis and can contribute to pneumonia. If you are a smoker and continue to smoke, your cough may last several weeks after your  pneumonia has cleared. °· A cold steam vaporizer or humidifier in your room or home may help loosen mucus. °· Coughing is often worse at night. Sleeping in a semi-upright position in a recliner or using a couple pillows under your head will help with this. °· Get rest as you feel it is needed. Your body will usually let you know when you need to rest. °PREVENTION °A pneumococcal shot (vaccine) is available to prevent a common bacterial cause of pneumonia. This is usually suggested for: °· People over 65 years old. °· Patients on chemotherapy. °· People with chronic lung problems, such as bronchitis or emphysema. °· People with immune system problems. °If you are over 65 or have a high risk condition, you may receive the pneumococcal vaccine if you have not received it before. In some countries, a routine influenza vaccine is also recommended. This vaccine can help prevent some cases of pneumonia. You may be offered the influenza vaccine as part of your care. °If you smoke, it is time to quit. You may receive instructions on how to stop smoking. Your health care provider can provide medicines and counseling to help you quit. °SEEK MEDICAL CARE IF: °You have a fever. °SEEK IMMEDIATE MEDICAL CARE IF:  °· Your illness becomes worse. This is especially true if you are elderly or weakened from any other disease. °· You cannot control your cough with suppressants and are losing sleep. °· You begin coughing up blood. °· You develop pain which is getting worse or is uncontrolled with medicines. °· Any of the symptoms   which initially brought you in for treatment are getting worse rather than better.  You develop shortness of breath or chest pain. MAKE SURE YOU:   Understand these instructions.  Will watch your condition.  Will get help right away if you are not doing well or get worse. Document Released: 10/30/2005 Document Revised: 03/16/2014 Document Reviewed: 01/19/2011 [facility] Patient Information 2015  [place], [place]. This information is not intended to replace advice given to you by your health care provider. Make sure you discuss any questions you have with your health care provider.  Take the prescription meds as directed.  Increase fluid intake to prevent dehydration and promote productive cough.  Follow-up with one of the local clinics provided for ongoing medical care. Take Tylenol or Motrin for pain relief.

## 2015-07-13 NOTE — ED Provider Notes (Signed)
[facility] Emergency Department Provider Note ____________________________________________  Time seen: 1508  I have reviewed the triage vital signs and the nursing notes.  HISTORY  Chief Complaint  Medication Refill  HPI [name] is a 34 y.o. female to the ED for medication refill following leaving the inpatient hospitalist service last night, AMA. Requesting the discharge medication she would've received upon discharge this morning.She denies any fevers in the interim. A page has been placed to Dr. Nemiah Commander, the hospitalist who managed her while in-house, for planned discharge medicines.  Past Medical History  Diagnosis Date  . HCV (hepatitis C virus) 07/17/2013    Last Assessment & Plan:  - positive antibody most recently 08/2014 - hep C RNA 08/2014 was negative - prior to that, HCV RNA 096045 in 06/2013. No record for genotype or imaging.   - patient counseled that she could become re-infected with continued IVDU or unprotected sexual encounters.   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   Last Assessment & Plan:  Discussed routine use of Valtrex prophylaxis at 36 weeks. She will be delivered by cesarean at 36-37 weeks.   . Infection with methicillin-resistant Staphylococcus aureus 03/19/2013    Overview: Arm abscess with MRSA, treated.  Subsequent screening negative  . Tenosynovitis 08/19/2014  . Acute deep vein thrombosis of arm 08/19/2014    Last Assessment & Plan:  - diagnosed 08/2014 in hospital - s/p heparin gtt - patient never took her xarelto x 3 month course that was planned - no symptoms today.  No further treatment - we discussed avoiding IVDU (see below) to reduce her chances of provoked upper extremity DVT   . Asthma   . IV drug abuse     Patient Active Problem List   Diagnosis Date Noted  . Sepsis 07/10/2015  . Oligohydramnios 05/18/2015  . S/P cesarean section 05/18/2015  .  Preterm uterine contractions 05/17/2015  . Oligohydramnios antepartum 05/17/2015  . H/O cesarean section complicating pregnancy 05/17/2015  . Preterm contractions 05/17/2015  . AA (alcohol abuse) 12/08/2014  . Drug abuse during pregnancy 12/08/2014  . High risk sexual behavior 12/08/2014  . Tenosynovitis 08/19/2014  . Acute deep vein thrombosis of arm 08/19/2014  . HCV (hepatitis C virus) 07/17/2013  . H/O cesarean section 05/18/2013  . Herpes infection in pregnancy 05/18/2013  . Infection with methicillin-resistant Staphylococcus aureus 03/19/2013  . Cannabis abuse 03/18/2013  . Polysubstance abuse 03/18/2013  . Compulsive tobacco user syndrome 03/17/2013    Past Surgical History  Procedure Laterality Date  . Cesarean section    . Neck surgery      Fusion  . Cesarean section N/A 05/18/2015    Procedure: CESAREAN SECTION;  Surgeon: Hildred Laser, MD;  Location: [facility] ORS;  Service: Obstetrics;  Laterality: N/A;    Current Outpatient Rx  Name  Route  Sig  Dispense  Refill  . cefpodoxime (VANTIN) 200 MG tablet   Oral   Take 1 tablet (200 mg total) by mouth 2 (two) times daily.   20 tablet   0   . docusate sodium (COLACE) 100 MG capsule   Oral   Take 1 capsule (100 mg total) by mouth 2 (two) times daily as needed.   30 capsule   2   . enoxaparin (LOVENOX) 40 MG/0.4ML injection   Subcutaneous   Inject 0.4 mLs (40 mg total) into the skin daily.   30 Syringe   1   .  ferrous sulfate 325 (65 FE) MG tablet   Oral   Take 1 tablet (325 mg total) by mouth 3 (three) times daily with meals.   90 tablet   3   . ibuprofen (ADVIL,MOTRIN) 800 MG tablet   Oral   Take 1 tablet (800 mg total) by mouth every 8 (eight) hours as needed.   60 tablet   1   . lidocaine (LIDODERM) 5 %   Transdermal   Place 1 patch onto the skin daily. Remove & Discard patch within 12 hours or as directed by MD   3 patch   0   . oxyCODONE-acetaminophen (PERCOCET/ROXICET) 5-325 MG per tablet   Oral    Take 1-2 tablets by mouth every 6 (six) hours as needed for severe pain (for pain scale greater than 7).   30 tablet   0   . Prenatal Vit-Fe Fumarate-FA (MULTIVITAMIN-PRENATAL) 27-0.8 MG TABS tablet   Oral   Take 1 tablet by mouth daily at 12 noon.         . ranitidine (ZANTAC) 150 MG tablet   Oral   Take 1 tablet (150 mg total) by mouth 2 (two) times daily.   20 tablet   0   . sulfamethoxazole-trimethoprim (BACTRIM DS,SEPTRA DS) 800-160 MG per tablet   Oral   Take 1 tablet by mouth 2 (two) times daily.   20 tablet   0    Allergies Amoxicillin; Latex; Vancomycin; and Clindamycin/lincomycin  Family History  Problem Relation Age of Onset  . Diabetes Mother   . COPD Mother   . Hypertension Mother   . Lung cancer Mother   . Alcohol abuse Father   . Lung cancer Maternal Grandmother     Social History Social History  Substance Use Topics  . Smoking status: Current Every Day Smoker -- 1.00 packs/day for 7 years    Types: Cigarettes  . Smokeless tobacco: Never Used  . Alcohol Use: No   Review of Systems  Constitutional: Negative for fever. Eyes: Negative for visual changes. ENT: Negative for sore throat. Cardiovascular: Negative for chest pain. Respiratory: Negative for shortness of breath. Gastrointestinal: Negative for abdominal pain, vomiting and diarrhea. Genitourinary: Negative for dysuria. Musculoskeletal: Negative for back pain. Skin: Negative for rash. Neurological: Negative for headaches, focal weakness or numbness. ____________________________________________  PHYSICAL EXAM:  VITAL SIGNS: ED Triage Vitals  Enc Vitals Group     BP 07/13/15 1424 114/53 mmHg     Pulse Rate 07/13/15 1424 72     Resp 07/13/15 1424 18     Temp 07/13/15 1424 99.3 F (37.4 C)     Temp Source 07/13/15 1424 Oral     SpO2 07/13/15 1424 94 %     Weight 07/13/15 1424 130 lb (58.968 kg)     Height 07/13/15 1424  (1.702 m)     Head Cir --      Peak Flow --      Pain  Score --      Pain Loc --      Pain Edu? --      Excl. in GC? --    Constitutional: Alert and oriented. Well appearing and in no distress. Eyes: Conjunctivae are normal. PERRL. Normal extraocular movements. ENT   Head: Normocephalic and atraumatic.   Nose: No congestion/rhinnorhea.   Mouth/Throat: Mucous membranes are moist.   Neck: Supple. No thyromegaly. Hematological/Lymphatic/Immunilogical: No cervical lymphadenopathy. Cardiovascular: Normal rate, regular rhythm.  Respiratory: Normal respiratory effort. No wheezes/rales/rhonchi. Gastrointestinal: Soft and  nontender. No distention. Musculoskeletal: Nontender with normal range of motion in all extremities.  Neurologic:  Normal gait without ataxia. Normal speech and language. No gross focal neurologic deficits are appreciated. Skin:  Skin is warm, dry and intact. No rash noted. Psychiatric: Mood and affect are normal. Patient exhibits appropriate insight and judgment. ____________________________________________  INITIAL IMPRESSION / ASSESSMENT AND PLAN / ED COURSE  ----------------------------------------- 3:15 PM on 07/13/2015  Page returned by Dr. Wiliam Ke ----------------------------------------- She is not inclined to write outpatient prescriptions since patient left AMA from hospital inpatient service. She suggest cefpodoxime 200 mg BID #20 and Bactrim DS BID #20. Patient provided with prescriptions and discharge instructions. Follow-up with one of the local community clinics or return for worsening symptoms. ____________________________________________  FINAL CLINICAL IMPRESSION(S) / ED DIAGNOSES  Final diagnoses:  Community acquired pneumonia     Lissa Hoard, PA-C 07/13/15 1556  Myrna Blazer, MD 07/13/15 2325

## 2015-07-15 LAB — CULTURE, BLOOD (ROUTINE X 2)
CULTURE: NO GROWTH
CULTURE: NO GROWTH

## 2015-07-30 ENCOUNTER — Emergency Department
Admission: EM | Admit: 2015-07-30 | Discharge: 2015-07-30 | Disposition: A | Discharge status: Home / Self Care | Payer: Medicaid Other | Attending: Emergency Medicine | Admitting: Emergency Medicine

## 2015-07-30 ENCOUNTER — Emergency Department: Payer: Medicaid Other

## 2015-07-30 ENCOUNTER — Encounter: Payer: Self-pay | Admitting: *Deleted

## 2015-07-30 DIAGNOSIS — Z880 Allergy status to penicillin: Secondary | ICD-10-CM | POA: Insufficient documentation

## 2015-07-30 DIAGNOSIS — R079 Chest pain, unspecified: Secondary | ICD-10-CM | POA: Diagnosis present

## 2015-07-30 DIAGNOSIS — R091 Pleurisy: Secondary | ICD-10-CM | POA: Diagnosis not present

## 2015-07-30 DIAGNOSIS — Z91040 Latex allergy status: Secondary | ICD-10-CM | POA: Insufficient documentation

## 2015-07-30 DIAGNOSIS — Z7901 Long term (current) use of anticoagulants: Secondary | ICD-10-CM | POA: Insufficient documentation

## 2015-07-30 DIAGNOSIS — Z79899 Other long term (current) drug therapy: Secondary | ICD-10-CM | POA: Diagnosis not present

## 2015-07-30 DIAGNOSIS — Z792 Long term (current) use of antibiotics: Secondary | ICD-10-CM | POA: Insufficient documentation

## 2015-07-30 DIAGNOSIS — Z720 Tobacco use: Secondary | ICD-10-CM | POA: Insufficient documentation

## 2015-07-30 MED ORDER — LEVOFLOXACIN 500 MG PO TABS: 500.0000 mg | ORAL_TABLET | Freq: Every day | ORAL | Status: AC

## 2015-07-30 NOTE — ED Provider Notes (Addendum)
[facility] Emergency Department Provider Note  ____________________________________________  Time seen: 2:00 AM  I have reviewed the triage vital signs and the nursing notes.   HISTORY  Chief Complaint Pleurisy      HPI [name] is a 34 y.o. female presents with right upper chest discomfort that is worse with deep inspiration. Patient described the pain at present is mild. Of note patient recently finished a course of antibiotics for pneumonia. Patient admits to subjective fevers yesterday. Patient admits that she was not completely compliant with the antibiotic therapy.    Past Medical History  Diagnosis Date  . HCV (hepatitis C virus) 07/17/2013    Last Assessment & Plan:  - positive antibody most recently 08/2014 - hep C RNA 08/2014 was negative - prior to that, HCV RNA [id number] in 06/2013. No record for genotype or imaging.   - patient counseled that she could become re-infected with continued IVDU or unprotected sexual encounters.   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   Last Assessment & Plan:  Discussed routine use of Valtrex prophylaxis at 36 weeks. She will be delivered by cesarean at 36-37 weeks.   . Infection with methicillin-resistant Staphylococcus aureus 03/19/2013    Overview: Arm abscess with MRSA, treated.  Subsequent screening negative  . Tenosynovitis 08/19/2014  . Acute deep vein thrombosis of arm 08/19/2014    Last Assessment & Plan:  - diagnosed 08/2014 in hospital - s/p heparin gtt - patient never took her xarelto x 3 month course that was planned - no symptoms today.  No further treatment - we discussed avoiding IVDU (see below) to reduce her chances of provoked upper extremity DVT   . Asthma   . IV drug abuse     Patient Active Problem List   Diagnosis Date Noted  . Sepsis 07/10/2015  . Oligohydramnios 05/18/2015  . S/P cesarean section 05/18/2015  . Preterm uterine  contractions 05/17/2015  . Oligohydramnios antepartum 05/17/2015  . H/O cesarean section complicating pregnancy 05/17/2015  . Preterm contractions 05/17/2015  . AA (alcohol abuse) 12/08/2014  . Drug abuse during pregnancy 12/08/2014  . High risk sexual behavior 12/08/2014  . Tenosynovitis 08/19/2014  . Acute deep vein thrombosis of arm 08/19/2014  . HCV (hepatitis C virus) 07/17/2013  . H/O cesarean section 05/18/2013  . Herpes infection in pregnancy 05/18/2013  . Infection with methicillin-resistant Staphylococcus aureus 03/19/2013  . Cannabis abuse 03/18/2013  . Polysubstance abuse 03/18/2013  . Compulsive tobacco user syndrome 03/17/2013    Past Surgical History  Procedure Laterality Date  . Cesarean section    . Neck surgery      Fusion  . Cesarean section N/A 05/18/2015    Procedure: CESAREAN SECTION;  Surgeon: Hildred Laser, MD;  Location: [facility] ORS;  Service: Obstetrics;  Laterality: N/A;    Current Outpatient Rx  Name  Route  Sig  Dispense  Refill  . cefpodoxime (VANTIN) 200 MG tablet   Oral   Take 1 tablet (200 mg total) by mouth 2 (two) times daily.   20 tablet   0   . docusate sodium (COLACE) 100 MG capsule   Oral   Take 1 capsule (100 mg total) by mouth 2 (two) times daily as needed.   30 capsule   2   . enoxaparin (LOVENOX) 40 MG/0.4ML injection   Subcutaneous   Inject 0.4 mLs (40 mg total) into the skin daily.   30  Syringe   1   . ferrous sulfate 325 (65 FE) MG tablet   Oral   Take 1 tablet (325 mg total) by mouth 3 (three) times daily with meals.   90 tablet   3   . ibuprofen (ADVIL,MOTRIN) 800 MG tablet   Oral   Take 1 tablet (800 mg total) by mouth every 8 (eight) hours as needed.   60 tablet   1   . levofloxacin (LEVAQUIN) 500 MG tablet   Oral   Take 1 tablet (500 mg total) by mouth daily.   10 tablet   0   . lidocaine (LIDODERM) 5 %   Transdermal   Place 1 patch onto the skin daily. Remove & Discard patch within 12 hours or as directed  by MD   3 patch   0   . oxyCODONE-acetaminophen (PERCOCET/ROXICET) 5-325 MG per tablet   Oral   Take 1-2 tablets by mouth every 6 (six) hours as needed for severe pain (for pain scale greater than 7).   30 tablet   0   . Prenatal Vit-Fe Fumarate-FA (MULTIVITAMIN-PRENATAL) 27-0.8 MG TABS tablet   Oral   Take 1 tablet by mouth daily at 12 noon.         . ranitidine (ZANTAC) 150 MG tablet   Oral   Take 1 tablet (150 mg total) by mouth 2 (two) times daily.   20 tablet   0   . sulfamethoxazole-trimethoprim (BACTRIM DS,SEPTRA DS) 800-160 MG per tablet   Oral   Take 1 tablet by mouth 2 (two) times daily.   20 tablet   0     Allergies Amoxicillin; Latex; Vancomycin; and Clindamycin/lincomycin  Family History  Problem Relation Age of Onset  . Diabetes Mother   . COPD Mother   . Hypertension Mother   . Lung cancer Mother   . Alcohol abuse Father   . Lung cancer Maternal Grandmother     Social History Social History  Substance Use Topics  . Smoking status: Current Every Day Smoker -- 1.00 packs/day for 7 years    Types: Cigarettes  . Smokeless tobacco: Never Used  . Alcohol Use: No    Review of Systems  Constitutional: Negative for fever. Eyes: Negative for visual changes. ENT: Negative for sore throat. Cardiovascular: Positive for chest pain. Respiratory: Negative for shortness of breath. Gastrointestinal: Negative for abdominal pain, vomiting and diarrhea. Genitourinary: Negative for dysuria. Musculoskeletal: Negative for back pain. Skin: Negative for rash. Neurological: Negative for headaches, focal weakness or numbness.   10-point ROS otherwise negative.  ____________________________________________   PHYSICAL EXAM:  VITAL SIGNS: ED Triage Vitals  Enc Vitals Group     BP 07/30/15 0113 157/82 mmHg     Pulse Rate 07/30/15 0113 88     Resp 07/30/15 0113 16     Temp 07/30/15 0113 98.4 F (36.9 C)     Temp Source 07/30/15 0113 Oral     SpO2  07/30/15 0113 100 %     Weight 07/30/15 0113 130 lb (58.968 kg)     Height --      Head Cir --      Peak Flow --      Pain Score 07/30/15 0114 7     Pain Loc --      Pain Edu? --      Excl. in GC? --      Constitutional: Alert and oriented. Well appearing and in no distress. Eyes: Conjunctivae are normal. PERRL. Normal extraocular  movements. ENT   Head: Normocephalic and atraumatic.   Nose: No congestion/rhinnorhea.   Mouth/Throat: Mucous membranes are moist.   Neck: No stridor. Cardiovascular: Normal rate, regular rhythm. Normal and symmetric distal pulses are present in all extremities. No murmurs, rubs, or gallops. Respiratory: Normal respiratory effort without tachypnea nor retractions. Breath sounds are clear and equal bilaterally. No wheezes/rales/rhonchi. Gastrointestinal: Soft and nontender. No distention. There is no CVA tenderness. Genitourinary: deferred Musculoskeletal: Nontender with normal range of motion in all extremities. No joint effusions.  No lower extremity tenderness nor edema. Neurologic:  Normal speech and language. No gross focal neurologic deficits are appreciated. Speech is normal.  Skin:  Skin is warm, dry and intact. No rash noted. Psychiatric: Mood and affect are normal. Speech and behavior are normal. Patient exhibits appropriate insight and judgment.  ED ECG REPORT I, BROWN, Mayfield N, the attending physician, personally viewed and interpreted this ECG.   Date: 07/30/2015  EKG Time: 1:24 AM  Rate: 87  Rhythm: Normal sinus rhythm  Axis: None  Intervals: Normal  ST&T Change: None   RADIOLOGY  DG Chest 2 View (Final result) Result time: 07/30/15 03:21:29   Final result by Rad Results In Interface (07/30/15 03:21:29)   Narrative:   CLINICAL DATA: Right-sided chest pain. Recently completed course of antibiotics for pneumonia. Persistent pain.  EXAM: CHEST 2 VIEW  COMPARISON: Chest radiograph  07/12/2015  FINDINGS: Decreased right pleural effusion with minimal residual blunting of the costophrenic angle. Improved right basilar aeration. The left lung is clear. Cardiomediastinal contours are normal. No pneumothorax. No acute osseous abnormality. Postsurgical change in the lower cervical spine.  IMPRESSION: Diminished right pleural effusion from prior exam with minimal residual blunting of the costophrenic angle. Improved right basilar aeration. No new abnormality is seen.   Electronically Signed By: Rubye Oaks M.D. On: 07/30/2015 03:21      INITIAL IMPRESSION / ASSESSMENT AND PLAN / ED COURSE  Pertinent labs & imaging results that were available during my care of the patient were reviewed by me and considered in my medical decision making (see chart for details).  Patient with right chest discomfort with recent history of pneumonia. Considered pulmonary emboli is a possibility given patient's history of a DVT in the past however patient had a CT scan of the chest on 07/05/2015 which revealed no pulmonary emboli. Chest x-ray today improved however given patient's noncompliance with antibiotic therapy will prescribe Levaquin 500 mg today.  ____________________________________________   FINAL CLINICAL IMPRESSION(S) / ED DIAGNOSES  Final diagnoses:  Pleurisy      Darci Current, MD 07/30/15 0454  Darci Current, MD 07/30/15 [phone]

## 2015-07-30 NOTE — ED Notes (Signed)
MD made aware of pt's VS at time of discharge. Pt cleared for discharge at this time.

## 2015-07-30 NOTE — ED Notes (Signed)
Pt states she has recently finished a course of antibiotics for pneumonia. Pt reports she is still having some pain in the upper right chest and feels like cannot take a full deep breath.

## 2015-07-30 NOTE — Discharge Instructions (Signed)

## 2016-01-12 ENCOUNTER — Emergency Department
Admission: EM | Admit: 2016-01-12 | Discharge: 2016-01-12 | Disposition: A | Discharge status: Home / Self Care | Payer: Medicaid Other | Attending: Emergency Medicine | Admitting: Emergency Medicine

## 2016-01-12 ENCOUNTER — Encounter: Payer: Self-pay | Admitting: Emergency Medicine

## 2016-01-12 DIAGNOSIS — R600 Localized edema: Secondary | ICD-10-CM | POA: Insufficient documentation

## 2016-01-12 DIAGNOSIS — F329 Major depressive disorder, single episode, unspecified: Secondary | ICD-10-CM | POA: Diagnosis not present

## 2016-01-12 DIAGNOSIS — Z79899 Other long term (current) drug therapy: Secondary | ICD-10-CM | POA: Diagnosis not present

## 2016-01-12 DIAGNOSIS — Z008 Encounter for other general examination: Secondary | ICD-10-CM | POA: Diagnosis present

## 2016-01-12 DIAGNOSIS — R4781 Slurred speech: Secondary | ICD-10-CM | POA: Insufficient documentation

## 2016-01-12 DIAGNOSIS — F17210 Nicotine dependence, cigarettes, uncomplicated: Secondary | ICD-10-CM | POA: Insufficient documentation

## 2016-01-12 DIAGNOSIS — F1110 Opioid abuse, uncomplicated: Secondary | ICD-10-CM | POA: Diagnosis not present

## 2016-01-12 DIAGNOSIS — G8929 Other chronic pain: Secondary | ICD-10-CM | POA: Diagnosis not present

## 2016-01-12 DIAGNOSIS — Z91040 Latex allergy status: Secondary | ICD-10-CM | POA: Insufficient documentation

## 2016-01-12 DIAGNOSIS — Z880 Allergy status to penicillin: Secondary | ICD-10-CM | POA: Insufficient documentation

## 2016-01-12 DIAGNOSIS — F32A Depression, unspecified: Secondary | ICD-10-CM

## 2016-01-12 DIAGNOSIS — R45851 Suicidal ideations: Secondary | ICD-10-CM | POA: Diagnosis not present

## 2016-01-12 DIAGNOSIS — R259 Unspecified abnormal involuntary movements: Secondary | ICD-10-CM | POA: Diagnosis not present

## 2016-01-12 NOTE — [facility] Counselor (Signed)
TTS Counselor provided pt and her sister with resources for substance abuse intensive outpatient programs.

## 2016-01-12 NOTE — ED Notes (Signed)
Pt arrived to the ED accompanied by her sister asking to be mentally and physically evaluated. According to the Pt and her sister the Pt has been acting erratically for short periods of time, fighting sleep, crying and not remembering things. 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 Pt is AOx4 anxious during triage.

## 2016-01-12 NOTE — ED Notes (Signed)
MD at bedside. 

## 2016-01-12 NOTE — ED Notes (Signed)
Pt declined any intervention for blood work or urinalysis.

## 2016-01-12 NOTE — Discharge Instructions (Signed)
Please re-establish a primary care physician for your chronic medical conditions, and to make a long term pain management plan for your chronic pain.  Please make an appointment with an outpatient mental health professional to discuss your symptoms.  Return to the emergency department if you develop severe or new pain, thoughts of hurting yourself or anyone else, hallucinations, or any other symptoms concerning to you.

## 2016-01-12 NOTE — ED Provider Notes (Signed)
[facility] Emergency Department Provider Note  ____________________________________________  Time seen: Approximately 8:06 PM  I have reviewed the triage vital signs and the nursing notes.   HISTORY  Chief Complaint Psychiatric Evaluation and Pelvic Pain    HPI [name] is a 35 y.o. female with a history of polysubstance abuse,HCV presenting for sleep deprivation and abnormal behavior. Patient reports that for the past several weeks, she has been having an increase of her chronic back and leg pain. She has been taking someone else's 30 mg oxycodone tablets. She has been having difficulty sleeping. She also occasionally has verbal outbursts "I think I have Tourette's." She also has episodes where she has abnormal movements of her hips and knees where she is swinging her hips and knees. No falls. No injury. She denies any current use of drugs. Her sister reports that she has told her that she "doesn't want to live anymore," but when asked the patient she states "I just want to be a good mom for my daughter. I one her to have a perfect life." She states that she would not kill herself because of her daughter. She has no active plan. She has no homicidal ideations or hallucinations.   Past Medical History  Diagnosis Date  . HCV (hepatitis C virus) 07/17/2013    Last Assessment & Plan:  - positive antibody most recently 08/2014 - hep C RNA 08/2014 was negative - prior to that, HCV RNA [id number] in 06/2013. No record for genotype or imaging.   - patient counseled that she could become re-infected with continued IVDU or unprotected sexual encounters.   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   Last Assessment & Plan:  Discussed routine use of Valtrex prophylaxis at 36 weeks. She will be delivered by cesarean at 36-37 weeks.   . Infection with methicillin-resistant Staphylococcus aureus 03/19/2013    Overview: Arm  abscess with MRSA, treated.  Subsequent screening negative  . Tenosynovitis 08/19/2014  . Acute deep vein thrombosis of arm (HCC) 08/19/2014    Last Assessment & Plan:  - diagnosed 08/2014 in hospital - s/p heparin gtt - patient never took her xarelto x 3 month course that was planned - no symptoms today.  No further treatment - we discussed avoiding IVDU (see below) to reduce her chances of provoked upper extremity DVT   . Asthma   . IV drug abuse     Patient Active Problem List   Diagnosis Date Noted  . Sepsis (HCC) 07/10/2015  . Oligohydramnios 05/18/2015  . S/P cesarean section 05/18/2015  . Preterm uterine contractions 05/17/2015  . Oligohydramnios antepartum 05/17/2015  . H/O cesarean section complicating pregnancy 05/17/2015  . Preterm contractions 05/17/2015  . AA (alcohol abuse) 12/08/2014  . Drug abuse during pregnancy 12/08/2014  . High risk sexual behavior 12/08/2014  . Tenosynovitis 08/19/2014  . Acute deep vein thrombosis of arm (HCC) 08/19/2014  . HCV (hepatitis C virus) 07/17/2013  . H/O cesarean section 05/18/2013  . Herpes infection in pregnancy 05/18/2013  . Infection with methicillin-resistant Staphylococcus aureus 03/19/2013  . Cannabis abuse 03/18/2013  . Polysubstance abuse 03/18/2013  . Compulsive tobacco user syndrome 03/17/2013    Past Surgical History  Procedure Laterality Date  . Cesarean section    . Neck surgery      Fusion  . Cesarean section N/A 05/18/2015    Procedure: CESAREAN SECTION;  Surgeon: Hildred Laser, MD;  Location: [facility]  ORS;  Service: Obstetrics;  Laterality: N/A;    Current Outpatient Rx  Name  Route  Sig  Dispense  Refill  . cefpodoxime (VANTIN) 200 MG tablet   Oral   Take 1 tablet (200 mg total) by mouth 2 (two) times daily.   20 tablet   0   . docusate sodium (COLACE) 100 MG capsule   Oral   Take 1 capsule (100 mg total) by mouth 2 (two) times daily as needed.   30 capsule   2   . enoxaparin (LOVENOX) 40 MG/0.4ML  injection   Subcutaneous   Inject 0.4 mLs (40 mg total) into the skin daily.   30 Syringe   1   . ferrous sulfate 325 (65 FE) MG tablet   Oral   Take 1 tablet (325 mg total) by mouth 3 (three) times daily with meals.   90 tablet   3   . ibuprofen (ADVIL,MOTRIN) 800 MG tablet   Oral   Take 1 tablet (800 mg total) by mouth every 8 (eight) hours as needed.   60 tablet   1   . lidocaine (LIDODERM) 5 %   Transdermal   Place 1 patch onto the skin daily. Remove & Discard patch within 12 hours or as directed by MD   3 patch   0   . oxyCODONE-acetaminophen (PERCOCET/ROXICET) 5-325 MG per tablet   Oral   Take 1-2 tablets by mouth every 6 (six) hours as needed for severe pain (for pain scale greater than 7).   30 tablet   0   . Prenatal Vit-Fe Fumarate-FA (MULTIVITAMIN-PRENATAL) 27-0.8 MG TABS tablet   Oral   Take 1 tablet by mouth daily at 12 noon.         . ranitidine (ZANTAC) 150 MG tablet   Oral   Take 1 tablet (150 mg total) by mouth 2 (two) times daily.   20 tablet   0   . sulfamethoxazole-trimethoprim (BACTRIM DS,SEPTRA DS) 800-160 MG per tablet   Oral   Take 1 tablet by mouth 2 (two) times daily.   20 tablet   0     Allergies Amoxicillin; Latex; Vancomycin; and Clindamycin/lincomycin  Family History  Problem Relation Age of Onset  . Diabetes Mother   . COPD Mother   . Hypertension Mother   . Lung cancer Mother   . Alcohol abuse Father   . Lung cancer Maternal Grandmother     Social History Social History  Substance Use Topics  . Smoking status: Current Every Day Smoker -- 1.00 packs/day for 7 years    Types: Cigarettes  . Smokeless tobacco: Never Used  . Alcohol Use: No    Review of Systems Constitutional: No fever/chills. No lightheadedness or syncope. Eyes: No visual changes. ENT: No sore throat. Cardiovascular: Denies chest pain, palpitations. Respiratory: Denies shortness of breath.  No cough. Gastrointestinal: No abdominal pain.  No  nausea, no vomiting.  No diarrhea.  No constipation. Genitourinary: Negative for dysuria. Musculoskeletal: Negative for back pain. Skin: Negative for rash. Neurological: Negative for headaches, focal weakness or numbness. Positive nonspecific body movements. Psychiatric:Positive depression. Positive passive suicidal ideations. Negative homicidal ideations. Negative hallucinations.  10-point ROS otherwise negative.  ____________________________________________   PHYSICAL EXAM:  VITAL SIGNS: ED Triage Vitals  Enc Vitals Group     BP 01/12/16 1930 158/91 mmHg     Pulse Rate 01/12/16 1930 80     Resp 01/12/16 1930 18     Temp 01/12/16 1930 99.3 F (  37.4 C)     Temp Source 01/12/16 1930 Oral     SpO2 01/12/16 1930 100 %     Weight 01/12/16 1930 131 lb (59.421 kg)     Height 01/12/16 1930  (1.676 m)     Head Cir --      Peak Flow --      Pain Score 01/12/16 1932 10     Pain Loc --      Pain Edu? --      Excl. in GC? --     Constitutional: Patient is alert and oriented and answering questions appropriately. She is comfortable appearing and moving around comfortably in the room. Eyes: Conjunctivae are normal.  EOMI. no scleral icterus. PERRLA. Head: Atraumatic. Nose: No congestion/rhinnorhea. Mouth/Throat: Mucous membranes are moist.  Neck: No stridor.  Supple.  No JVD. Cardiovascular: Normal rate, regular rhythm. No murmurs, rubs or gallops.  Respiratory: Normal respiratory effort.  No retractions. Lungs CTAB.  No wheezes, rales or ronchi. Gastrointestinal: Soft and nontender. No distention. No peritoneal signs. Musculoskeletal: No LE edema. Bilateral edema of the hands with normal radial pulses and normal grip strength. Neurologic:  Alert and oriented 3. Speech is mildly slurred but she is able to answer questions appropriately. Moves all extremities well. Gait without ataxia.  Skin:  Skin is warm, dry and intact. No rash noted. Psychiatric: Patient has a bizarre affect  but normal mood. She occasionally smiles and laughs, makes good eye contact. She has good insight into how she is feeling. She states that she would not kill herself because of her daughter.  ____________________________________________   LABS (all labs ordered are listed, but only abnormal results are displayed)  Labs Reviewed - No data to display ____________________________________________  EKG  Not indicated ____________________________________________  RADIOLOGY  No results found.  ____________________________________________   PROCEDURES  Procedure(s) performed: None  Critical Care performed: No ____________________________________________   INITIAL IMPRESSION / ASSESSMENT AND PLAN / ED COURSE  Pertinent labs & imaging results that were available during my care of the patient were reviewed by me and considered in my medical decision making (see chart for details).  35 y.o. female with a history of IV drug abuse, HCV presenting with abnormal movements and passive suicidal ideations. I reviewed a video from the patient's sister of the patient's abnormal movements. She is voluntarily swinging her hips and bending her knees, there is no tonic-clonic activity, there is no syncope associated with this. It is brief and lasts 10-15 seconds and self resolves. I do not think the patient is having a seizure, nor do I think she has a medication related abnormal movement. It is very unlikely that these movements are primarily neurologic, they're more likely psychiatric.  The patient has told her sister that she doesn't feel like life is worth living, but denies any active plans for suicide today. I have offered the patient to initiate blood work and urinalysis, and she has refused any additional testing today. She has agreed to await TTS in order to be able to get outpatient recommendations for psychiatric evaluation. She will also reestablish her primary care physician to make a  long-term pain management plan. I had a lengthy discussion with the patient and her sister about return precautions and follow-up instructions. They will be discharged home.   ____________________________________________  FINAL CLINICAL IMPRESSION(S) / ED DIAGNOSES  Final diagnoses:  Abnormal involuntary movement  Depression  Opioid abuse  Chronic pain  Passive suicidal ideations  NEW MEDICATIONS STARTED DURING THIS VISIT:  New Prescriptions   No medications on file     Rockne Menghini, MD 01/12/16 2015

## 2016-01-12 NOTE — ED Notes (Signed)
Patient walked out of facility before RN had a chance to go over discharge instructions.  MD notified.

## 2016-03-06 ENCOUNTER — Emergency Department
Admission: EM | Admit: 2016-03-06 | Discharge: 2016-03-06 | Disposition: A | Discharge status: Home / Self Care | Payer: Medicaid Other | Attending: Student | Admitting: Student

## 2016-03-06 DIAGNOSIS — J0110 Acute frontal sinusitis, unspecified: Secondary | ICD-10-CM

## 2016-03-06 DIAGNOSIS — F17210 Nicotine dependence, cigarettes, uncomplicated: Secondary | ICD-10-CM | POA: Diagnosis not present

## 2016-03-06 DIAGNOSIS — R0981 Nasal congestion: Secondary | ICD-10-CM | POA: Diagnosis present

## 2016-03-06 DIAGNOSIS — J45909 Unspecified asthma, uncomplicated: Secondary | ICD-10-CM | POA: Insufficient documentation

## 2016-03-06 MED ORDER — DOXYCYCLINE HYCLATE 100 MG PO TABS
100.0000 mg | ORAL_TABLET | Freq: Once | ORAL | Status: AC
  Administered 2016-03-06: 100 mg via ORAL
  Filled 2016-03-06: qty 1

## 2016-03-06 MED ORDER — DOXYCYCLINE HYCLATE 100 MG PO TABS: 100.0000 mg | ORAL_TABLET | Freq: Two times a day (BID) | ORAL | Status: DC

## 2016-03-06 NOTE — Discharge Instructions (Signed)

## 2016-03-06 NOTE — ED Provider Notes (Signed)
[facility] Emergency Department Provider Note  ____________________________________________  Time seen: Approximately 9:36 PM  I have reviewed the triage vital signs and the nursing notes.   HISTORY  Chief Complaint Nasal Congestion    HPI [name] is a 35 y.o. female with 2-3 weeks of right-sided sinus pressure, colored drainage, headache. Fevers at times. Occasional dry cough.   Past Medical History  Diagnosis Date  . HCV (hepatitis C virus) 07/17/2013    Last Assessment & Plan:  - positive antibody most recently 08/2014 - hep C RNA 08/2014 was negative - prior to that, HCV RNA [id number] in 06/2013. No record for genotype or imaging.   - patient counseled that she could become re-infected with continued IVDU or unprotected sexual encounters.   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   Last Assessment & Plan:  Discussed routine use of Valtrex prophylaxis at 36 weeks. She will be delivered by cesarean at 36-37 weeks.   . Infection with methicillin-resistant Staphylococcus aureus 03/19/2013    Overview: Arm abscess with MRSA, treated.  Subsequent screening negative  . Tenosynovitis 08/19/2014  . Acute deep vein thrombosis of arm (HCC) 08/19/2014    Last Assessment & Plan:  - diagnosed 08/2014 in hospital - s/p heparin gtt - patient never took her xarelto x 3 month course that was planned - no symptoms today.  No further treatment - we discussed avoiding IVDU (see below) to reduce her chances of provoked upper extremity DVT   . Asthma   . IV drug abuse     Patient Active Problem List   Diagnosis Date Noted  . Sepsis (HCC) 07/10/2015  . Oligohydramnios 05/18/2015  . S/P cesarean section 05/18/2015  . Preterm uterine contractions 05/17/2015  . Oligohydramnios antepartum 05/17/2015  . H/O cesarean section complicating pregnancy 05/17/2015  . Preterm contractions 05/17/2015  . AA (alcohol abuse)  12/08/2014  . Drug abuse during pregnancy 12/08/2014  . High risk sexual behavior 12/08/2014  . Tenosynovitis 08/19/2014  . Acute deep vein thrombosis of arm (HCC) 08/19/2014  . HCV (hepatitis C virus) 07/17/2013  . H/O cesarean section 05/18/2013  . Herpes infection in pregnancy 05/18/2013  . Infection with methicillin-resistant Staphylococcus aureus 03/19/2013  . Cannabis abuse 03/18/2013  . Polysubstance abuse 03/18/2013  . Compulsive tobacco user syndrome 03/17/2013    Past Surgical History  Procedure Laterality Date  . Cesarean section    . Neck surgery      Fusion  . Cesarean section N/A 05/18/2015    Procedure: CESAREAN SECTION;  Surgeon: Hildred Laser, MD;  Location: [facility] ORS;  Service: Obstetrics;  Laterality: N/A;    Current Outpatient Rx  Name  Route  Sig  Dispense  Refill  . cefpodoxime (VANTIN) 200 MG tablet   Oral   Take 1 tablet (200 mg total) by mouth 2 (two) times daily.   20 tablet   0   . docusate sodium (COLACE) 100 MG capsule   Oral   Take 1 capsule (100 mg total) by mouth 2 (two) times daily as needed.   30 capsule   2   . doxycycline (VIBRA-TABS) 100 MG tablet   Oral   Take 1 tablet (100 mg total) by mouth 2 (two) times daily.   20 tablet   1   . enoxaparin (LOVENOX) 40 MG/0.4ML injection   Subcutaneous   Inject 0.4 mLs (40 mg total) into the skin daily.  30 Syringe   1   . ferrous sulfate 325 (65 FE) MG tablet   Oral   Take 1 tablet (325 mg total) by mouth 3 (three) times daily with meals.   90 tablet   3   . ibuprofen (ADVIL,MOTRIN) 800 MG tablet   Oral   Take 1 tablet (800 mg total) by mouth every 8 (eight) hours as needed.   60 tablet   1   . lidocaine (LIDODERM) 5 %   Transdermal   Place 1 patch onto the skin daily. Remove & Discard patch within 12 hours or as directed by MD   3 patch   0   . oxyCODONE-acetaminophen (PERCOCET/ROXICET) 5-325 MG per tablet   Oral   Take 1-2 tablets by mouth every 6 (six) hours as needed for  severe pain (for pain scale greater than 7).   30 tablet   0   . Prenatal Vit-Fe Fumarate-FA (MULTIVITAMIN-PRENATAL) 27-0.8 MG TABS tablet   Oral   Take 1 tablet by mouth daily at 12 noon.         . ranitidine (ZANTAC) 150 MG tablet   Oral   Take 1 tablet (150 mg total) by mouth 2 (two) times daily.   20 tablet   0   . sulfamethoxazole-trimethoprim (BACTRIM DS,SEPTRA DS) 800-160 MG per tablet   Oral   Take 1 tablet by mouth 2 (two) times daily.   20 tablet   0     Allergies Amoxicillin; Latex; Vancomycin; and Clindamycin/lincomycin  Family History  Problem Relation Age of Onset  . Diabetes Mother   . COPD Mother   . Hypertension Mother   . Lung cancer Mother   . Alcohol abuse Father   . Lung cancer Maternal Grandmother     Social History Social History  Substance Use Topics  . Smoking status: Current Every Day Smoker -- 1.00 packs/day for 7 years    Types: Cigarettes  . Smokeless tobacco: Never Used  . Alcohol Use: No    Review of Systems Constitutional: per hpi Eyes: No visual changes. ENT: No sore throat. Cardiovascular: Denies chest pain. Respiratory: Denies shortness of breath. Gastrointestinal: No abdominal pain.  No nausea, no vomiting.  Skin: Negative for rash. Neurological: Negative for headaches, focal weakness or numbness. 10-point ROS otherwise negative.  ____________________________________________   PHYSICAL EXAM:  VITAL SIGNS: ED Triage Vitals  Enc Vitals Group     BP 03/06/16 2112 168/107 mmHg     Pulse Rate 03/06/16 2111 90     Resp 03/06/16 2111 16     Temp 03/06/16 2111 98.3 F (36.8 C)     Temp Source 03/06/16 2111 Oral     SpO2 03/06/16 2111 99 %     Weight 03/06/16 2111 135 lb (61.236 kg)     Height 03/06/16 2111  (1.702 m)     Head Cir --      Peak Flow --      Pain Score 03/06/16 2111 8     Pain Loc --      Pain Edu? --      Excl. in GC? --     Constitutional: Alert and oriented. Well appearing and in no  acute distress. Eyes: Conjunctivae are normal. PERRL. EOMI. Ears:  Clear with normal landmarks. No erythema. Head: Atraumatic. Nose: No congestion/rhinnorhea. Mouth/Throat: Mucous membranes are moist.  Oropharynx non-erythematous. No lesions. Neck:  Supple.  No adenopathy.   Cardiovascular: Normal rate, regular rhythm. Grossly normal heart sounds.  Good peripheral circulation. Respiratory: Normal respiratory effort.  No retractions. Lungs CTAB. Musculoskeletal: Nml ROM of upper and lower extremity joints. Neurologic:  Normal speech and language. No gross focal neurologic deficits are appreciated. No gait instability. Skin:  Skin is warm, dry and intact. No rash noted. Psychiatric: Mood and affect are normal. Speech and behavior are normal.  ____________________________________________   LABS (all labs ordered are listed, but only abnormal results are displayed)  Labs Reviewed - No data to display ____________________________________________  EKG   ____________________________________________  RADIOLOGY   ____________________________________________   PROCEDURES  Procedure(s) performed: None  Critical Care performed: No  ____________________________________________   INITIAL IMPRESSION / ASSESSMENT AND PLAN / ED COURSE  Pertinent labs & imaging results that were available during my care of the patient were reviewed by me and considered in my medical decision making (see chart for details).  35 year old female with symptoms of a sinusitis. Given doxycycline. Follow-up with ENT if not improving. ____________________________________________   FINAL CLINICAL IMPRESSION(S) / ED DIAGNOSES  Final diagnoses:  Acute frontal sinusitis, recurrence not specified      Ignacia BayleyRobert Sincere Berlanga, PA-C 03/06/16 2137  Gayla DossEryka A Gayle, MD 03/06/16 2351

## 2016-03-06 NOTE — ED Notes (Addendum)
Pt reports to ED w/ c/o H/A and nasal congestion x 2-3 weeks.  NAD, ambulatory to room

## 2016-05-31 ENCOUNTER — Emergency Department
Admission: EM | Admit: 2016-05-31 | Discharge: 2016-05-31 | Disposition: A | Discharge status: Home / Self Care | Payer: Medicaid Other

## 2016-05-31 ENCOUNTER — Emergency Department
Admission: EM | Admit: 2016-05-31 | Discharge: 2016-05-31 | Disposition: A | Discharge status: Home / Self Care | Payer: Medicaid Other | Attending: Emergency Medicine | Admitting: Emergency Medicine

## 2016-05-31 ENCOUNTER — Encounter: Payer: Self-pay | Admitting: Emergency Medicine

## 2016-05-31 DIAGNOSIS — F1410 Cocaine abuse, uncomplicated: Secondary | ICD-10-CM | POA: Diagnosis not present

## 2016-05-31 DIAGNOSIS — R259 Unspecified abnormal involuntary movements: Secondary | ICD-10-CM

## 2016-05-31 DIAGNOSIS — Z792 Long term (current) use of antibiotics: Secondary | ICD-10-CM | POA: Insufficient documentation

## 2016-05-31 DIAGNOSIS — F1210 Cannabis abuse, uncomplicated: Secondary | ICD-10-CM | POA: Insufficient documentation

## 2016-05-31 DIAGNOSIS — Z86718 Personal history of other venous thrombosis and embolism: Secondary | ICD-10-CM | POA: Diagnosis not present

## 2016-05-31 DIAGNOSIS — F17210 Nicotine dependence, cigarettes, uncomplicated: Secondary | ICD-10-CM | POA: Insufficient documentation

## 2016-05-31 DIAGNOSIS — F1110 Opioid abuse, uncomplicated: Secondary | ICD-10-CM | POA: Insufficient documentation

## 2016-05-31 DIAGNOSIS — L03116 Cellulitis of left lower limb: Secondary | ICD-10-CM

## 2016-05-31 DIAGNOSIS — F1910 Other psychoactive substance abuse, uncomplicated: Secondary | ICD-10-CM | POA: Diagnosis not present

## 2016-05-31 DIAGNOSIS — J45909 Unspecified asthma, uncomplicated: Secondary | ICD-10-CM | POA: Insufficient documentation

## 2016-05-31 DIAGNOSIS — Z91040 Latex allergy status: Secondary | ICD-10-CM | POA: Diagnosis not present

## 2016-05-31 DIAGNOSIS — Z79899 Other long term (current) drug therapy: Secondary | ICD-10-CM | POA: Diagnosis not present

## 2016-05-31 DIAGNOSIS — R258 Other abnormal involuntary movements: Secondary | ICD-10-CM | POA: Diagnosis present

## 2016-05-31 MED ORDER — SULFAMETHOXAZOLE-TRIMETHOPRIM 800-160 MG PO TABS: 1.0000 | ORAL_TABLET | Freq: Two times a day (BID) | ORAL | Status: DC

## 2016-05-31 MED ORDER — CEPHALEXIN 500 MG PO CAPS
500.0000 mg | ORAL_CAPSULE | Freq: Three times a day (TID) | ORAL | Status: DC
Start: 2016-05-31 — End: 2016-07-19

## 2016-05-31 NOTE — ED Notes (Addendum)
Attempted to draw blood from pt's right upper arm and left shoulder; pt known IV drug user. Specimens given to Officer Sartwell and Matt HolmesBaer with [employer].

## 2016-05-31 NOTE — ED Notes (Signed)
Last used a "speed ball that contains, crack cocaine and heroin"  Last night. Pt resting with eyes closed , awake with verbal stimuli, pt will speak in understandable words for 2 or 3 words and will drift off to sleep, pt denies SI or HI, but realizes she has an addiction problem

## 2016-05-31 NOTE — ED Notes (Signed)
Pt was given cola , crackers and peanut butter, pt ambulatory with dystonia

## 2016-05-31 NOTE — ED Notes (Signed)
Called [name] [phone]), per pt request to transport home ,

## 2016-05-31 NOTE — ED Notes (Signed)
Pt was seen here today for blood draw, patient then returned to the police department and states during interview "they thought I was high".

## 2016-05-31 NOTE — ED Notes (Signed)
Pt to ED via EMS for withdrawal from crack and heroin, pt states last used last night.  Pt states "they said I was tweaking, and could come be seen, so of course I wanted to.  I'm fine now".  Pt denies any pain.  Pt presents speaking fast, unable to sit still, and rambling.

## 2016-05-31 NOTE — Discharge Instructions (Signed)
Dystonia Dystonia is a condition that makes your muscles contract without warning (muscle spasms). It can make doing everyday tasks hard. There are different forms of dystonia. The condition can affect just one part of your body, or it can affect larger areas of your body. Dystonia affects people in different ways. In some people, it is mild and goes away over time, while others may need treatment. Although there is no cure for dystonia, you can manage the condition with treatment. CAUSES  Dystonia may be caused by:  Genetics. This means you inherited the genes that cause you to be at risk for dystonia.  An abnormality in the part of your brain that controls movement (basal ganglia). Dystonia may also be acquired. If you have acquired dystonia, you developed the condition after:  Brain injury.  Infection.  Drug reaction. The cause of dystonia may also not be known (idiopathic dystonia).  SIGNS AND SYMPTOMS Signs and symptoms of dystonia can depend on which type of the condition you have. Common signs and symptoms include:  Muscle twitches or spasms around your eyes (blepharospasm).  Foot cramping or dragging.  Pulling of your neck to one side (torticollis).  Muscles spasms of the face.  Spasms of the voice box.  Tremors.  Awkward and painful positions.  Muscle cramping after muscle use. DIAGNOSIS  Your health care provider can diagnose dystonia based on your symptoms and medical history. Your health care provider will also do a physical exam. You may also have:   A blood test to check for genes that cause dystonia.  Brain imaging tests to rule out other causes of your symptoms. There are no tests that can diagnose other causes of dystonia. TREATMENT  There are no treatments that can cure or prevent dystonia. Treatment to manage dystonia may include:   Injecting the affected muscles with a chemical (botulinum) that blocks muscle spasms. This treatment can block spasms for a  few days to a few months.  Medicines to relax muscles. HOME CARE INSTRUCTIONS  Physical therapy to improve muscle strength and movement may be suggested by your health care provider. Continue your physical therapy exercises at home as instructed by your physical therapist.  Make sure you have a good support system. Let your health care provider know if you are struggling with stress or anxiety.  Keep all follow-up visits as directed by your health care provider. This is important.  Take medicines only as directed by your health care provider. SEEK MEDICAL CARE IF:  Your condition is changing or getting worse.  You need more support at home.   This information is not intended to replace advice given to you by your health care provider. Make sure you discuss any questions you have with your health care provider.   Document Released: 10/20/2002 Document Revised: 11/20/2014 Document Reviewed: 12/24/2013 Elsevier Interactive Patient Education 2016 Elsevier Inc.  Cellulitis Cellulitis is an infection of the skin and the tissue under the skin. The infected area is usually red and tender. This happens most often in the arms and lower legs. HOME CARE   Take your antibiotic medicine as told. Finish the medicine even if you start to feel better.  Keep the infected arm or leg raised (elevated).  Put a warm cloth on the area up to 4 times per day.  Only take medicines as told by your doctor.  Keep all doctor visits as told. GET HELP IF:  You see red streaks on the skin coming from the infected area.  Your red area gets bigger or turns a dark color.  Your bone or joint under the infected area is painful after the skin heals.  Your infection comes back in the same area or different area.  You have a puffy (swollen) bump in the infected area.  You have new symptoms.  You have a fever. GET HELP RIGHT AWAY IF:   You feel very sleepy.  You throw up (vomit) or have watery poop  (diarrhea).  You feel sick and have muscle aches and pains.   This information is not intended to replace advice given to you by your health care provider. Make sure you discuss any questions you have with your health care provider.   Document Released: 04/17/2008 Document Revised: 07/21/2015 Document Reviewed: 01/15/2012 Elsevier Interactive Patient Education 2016 [employer].  Polysubstance Abuse When people abuse more than one drug or type of drug it is called polysubstance or polydrug abuse. For example, many smokers also drink alcohol. This is one form of polydrug abuse. Polydrug abuse also refers to the use of a drug to counteract an unpleasant effect produced by another drug. It may also be used to help with withdrawal from another drug. People who take stimulants may become agitated. Sometimes this agitation is countered with a tranquilizer. This helps protect against the unpleasant side effects. Polydrug abuse also refers to the use of different drugs at the same time.  Anytime drug use is interfering with normal living activities, it has become abuse. This includes problems with family and friends. Psychological dependence has developed when your mind tells you that the drug is needed. This is usually followed by physical dependence which has developed when continuing increases of drug are required to get the same feeling or "high". This is known as addiction or chemical dependency. A person's risk is much higher if there is a history of chemical dependency in the family. SIGNS OF CHEMICAL DEPENDENCY  You have been told by friends or family that drugs have become a problem.  You fight when using drugs.  You are having blackouts (not remembering what you do while using).  You feel sick from using drugs but continue using.  You lie about use or amounts of drugs (chemicals) used.  You need chemicals to get you going.  You are suffering in work performance or in school because of  drug use.  You get sick from use of drugs but continue to use anyway.  You need drugs to relate to people or feel comfortable in social situations.  You use drugs to forget problems. "Yes" answered to any of the above signs of chemical dependency indicates there are problems. The longer the use of drugs continues, the greater the problems will become. If there is a family history of drug or alcohol use, it is best not to experiment with these drugs. Continual use leads to tolerance. After tolerance develops more of the drug is needed to get the same feeling. This is followed by addiction. With addiction, drugs become the most important part of life. It becomes more important to take drugs than participate in the other usual activities of life. This includes relating to friends and family. Addiction is followed by dependency. Dependency is a condition where drugs are now needed not just to get high, but to feel normal. Addiction cannot be cured but it can be stopped. This often requires outside help and the care of professionals. Treatment centers are listed in the yellow pages under: Cocaine, Narcotics, and  Alcoholics Anonymous. Most hospitals and clinics can refer you to a specialized care center. Talk to your caregiver if you need help.   This information is not intended to replace advice given to you by your health care provider. Make sure you discuss any questions you have with your health care provider.   Document Released: 06/21/2005 Document Revised: 01/22/2012 Document Reviewed: 11/04/2014 Elsevier Interactive Patient Education 2016 [employer]. [employer] Guide Inpatient Behavioral Health/Residential  Substance Abuse Treatment Adults The United Ways 211 is a great source of information about community services available.  Access by dialing 2-1-1 from anywhere in [place], or by website -  PooledIncome.pl.   (Updated 11/2015)  Crisis Assistance 24 hours a day     Services Offered    Area [employer]  24-hour crisis assistance: 209-330-9743 [place], [place]   [facility]  24-hour crisis assistance:386-081-1015 [place], [place]  [place]   24-hour crisis assistance: 978-705-1886 [place], [place]   [facility] Access to Care Line  24-hour crisis assistance; 519-455-6359 All   The[facility] Alternatives  24-hour crisis response line: 8204351290 All   Other Local Resources (Updated 11/2015)  Inpatient Behavioral Health/Residential Substance Abuse Treatment Programs   Services      Address and Phone Number  [facility] (Al[facility] Drug Abuse Treatment Center)   14-day residential rehabilitation  [phone] [address] [place], [place]  ARCA ([facility])    Detox - private pay only  14-day residential rehabilitation -  Medicaid, insurance, private pay only (530)[phone], or [phone] [address] [place], [place] [address]   [facility] [employer] only  Multiple facilities ([phone] admissions   [facility] ([facility])   90-day program  Must be homeless to participate  6047404915, or [phone] Marcy Panning, [facility]  [facility] only [phone], or  [phone] [address] [place], [place] [address]  [facility]     Must make an appointment  Transportation is offered from [place] on [employer].  Accepts private pay, Sheryn Bison [facility] [phone]  [address] [place], [place] [address]   [employer]  Females only  Associated with the [facility] [phone] [address] [place], [place] [address]  [facility] only 7607614194, or [phone] [address] [place], [place]  [facility]    Detox  Residential  rehabilitation  Private insurance only  Multiple locations [phone] admissions  [facility]    Private pay  Private insurance [phone] [address] [place], [place] [address]  [facility]    Males only  Fee required at time of admission [phone] [address] [place], [place] [address]  [facility]    Private pay only  [phone] [address] [place], [place]  RTS (Residential Treatment Services)    Detox - private pay, Medicaid  Residential rehabilitation for males  - Medicare, Medicaid, insurance, private pay [phone] [address] [place], [place]   OXBDZ    Walk-in interviews Monday - Saturday from 8 am - 4 pm  Individuals with legal charges are not eligible [phone] [address] [place], [place] [address]  [facility]   Must be willing to work  Must attend Alcoholics Anonymous meetings [phone] [address] [place], [place]   [facility] [employer]    Faith-based program  Private pay only [phone] [address] [place], [place]  State Street CorporationCommunity Resource Guide Shelters The United Ways 211 is a great source of information about community services available.  Access by dialing 2-1-1 from anywhere in [place], or by website -  PooledIncome.plwww.nc211.org.   Other Local Resources (Updated 11/2015)  Shelters  Services   Phone Number and Address  [facility]  Housing for homeless and needy men with substance abuse issues [phone] [address] [place], [place]  [employer] [place]  Emergency assistance  [facility]Shelter  Meals  Pantry services [phone] [place], [place]  [facility]  Domestic violence shelter for women and their children ([phone] [place], [place]  [facility]  Domestic violence shelter for women and their children (641)[phone] [place], [place]  [facility])     The [facility] coordinates access to most  shelters in [place]  Apply in person Monday - Friday, 10 am - 4 pm.    After hours/ weekends, contact individual shelters directly [phone] [address] [place], [place]  [facility] [facility]   Housing  Food  Emergency financial assistance  Permanent supportive housing [phone] [address] [place], [place]   The [facility]   Crisis assistance  Medication  Housing  Food  Utility assistance [phone] [address] [place], [place]  The [facility]    Crisis assistance  Medication  Housing  Food  Utility assistance [phone] [address], [place], [place]  The [facility]     Transitional housing  Case Proofreadermanagement  Utility assistance  Food  Clothing  Transportation assistance ([phone]) [phone] [address] [place], [place]  [facility], [facility] [facility] [place] [facility]  Shelter for adult men and women [phone] [address] [place], [place]  24-hour Crisis Line for those Facing Homelessness    [phone] [phone]  State Street CorporationCommunity Resource Guide Outpatient Counseling/Substance Abuse Adult The United Ways 211 is a great source of information about community services available.  Access by dialing 2-1-1 from anywhere in [place], or by website -  PooledIncome.plwww.nc211.org.   Other Local Resources (Updated 11/2015)  Crisis Hotlines   Services     Area Served  [employer] [facility]  Crisis Hotline, available 24 hours a day, 7 days a week: [phone] [facility], [place]   [facility]  Crisis Hotline, available 24 hours a day, 7 days a week: (303)[phone] [facility], [place]  [facility]  Suicide Prevention Hotline, available 24 hours a day, 7 days a week: 432-241-3574 [facility], [place]  [employer]   Crisis Hotline, available 24 hours a day, 7 days a week: (618)[phone]) [phone] [facility], [place]   [facility] Access to L-3 CommunicationsCare  Line  Crisis Hotline, available 24 hours a day, 7 days a week: 475-306-1105([phone]) [phone] All   [facility]  Crisis Hotline, available 24 hours a day, 7 days a week: (629)126-5215343-244-6936 All   Other Local Resources (Updated 11/2015)  Outpatient Counseling/ Substance Abuse Programs  Services     Address and Phone Number  ADS (Alcohol and Drug Services)   Options include Individual counseling, group counseling, intensive outpatient program (several hours a day, several days a week)  Offers depression assessments  Provides methadone maintenance program [phone] [address], [address] [place], [place] [address]   [facility]   Offers partial hospitalization/day treatment and DUI/DWI programs  S[employer] Incorporatedccepts Medicare, private insurance [phone] [address], [address] [place], [place] [phone]  [facility]    Services include  intensive outpatient program (several hours a day, several days a week), outpatient treatment, DUI/DWI services, family education  Also has some services specifically for [employer] transitional housing  [phone] [address] [place], [place] [address]     [facility]  Accepts Medicare, private pay, and private insurance [phone] [address] [place], [place] [address]  [employer] of Care  Services include individual counseling, substance abuse intensive outpatient program (several hours a day, several days a week), day treatment  Delene Loll, Medicaid, private insurance [phone] [address], [address] [place], [place] [address]  [facility]   Offers substance abuse intensive outpatient program (several hours a day, several days a week), partial hospitalization program [phone] [address] [place], [place] [address]  [phone] [address] [place], [place] [address]  [phone] [address] [place], [place] [address]  [phone], [address] [place], [place] [address]  [facility]  Individual counseling only  Accepts private insurance only [phone] [address] [place], [place] [address]  [facility]  Methadone maintenance program [phone] [address] [place], [place] [address]  [facility]  Walk-In Clinic providing substance abuse and mental health counseling  Accepts Medicaid, Medicare, private insurance  Offers sliding scale for uninsured [phone] [address] [place], [place]   Faith in [place], [employer].  Offers individual counseling, and intensive in-home services [phone] [address] [place], [place] [address]  [facility] individual counseling, family counseling, group therapy, domestic violence counseling, consumer credit counseling  Accepts Medicare, Medicaid, private insurance  Offers sliding scale for uninsured [phone] [address] [place], [place] [address]  [phone] [facility], [address] [place], [place] [address]  [facility]  Offers individual, family and group counseling  3 locations - [place], [place], and [place]  [phone]  [address] [place], [place] [address]  [address] [place], [place] [address]  [address] [place], [place] [address]  [facility] Margo Aye    Offers psychiatric assessment, 8-week Intensive Outpatient Program (several hours a day, several times a week, daytime or evenings), early recovery group, family Program, medication management  Private pay or private insurance only [phone], or  [phone] [address] [place], [place] [address]  [place] [employer] individual, couples and family counseling  Accepts Medicaid, private insurance, and sliding scale for uninsured [phone] [address] [place], [place] [address]  Len Blalock, MD  Individual counseling  Private insurance [phone] [address] [place], [place] [address]  [facility]   Offers assessment, substance abuse treatment, and behavioral health treatment [phone] [address] [place], [place] [address]  [facility]  Individual counseling  Accepts private insurance [phone] [address] [place], [place] [address]  [facility]  Individual counseling  Delene Loll, private insurance [phone] [address] [place], [place] [address]  [facility]    Offers intensive outpatient program (several hours a day, several times a week)  Private pay, private insurance [phone] [facility] [place], [place]  [facility]  Individual counseling  Medicare, private insurance [phone] [address] [place], [place] [address]  [facility] Behavioral Health Services    Offers intensive outpatient program (several hours a day, several times a week) and partial hospitalization program [phone] [address] [place], [place] [address]  Emerson Monte, MD  Individual counseling [phone] [address], [address] [place], [place] [address]  [facility]  Offers Christian counseling to individuals, couples, and families  Accepts Medicare and private insurance; offers sliding scale for uninsured [phone] [address] [place], [place] [address]  [facility]  [place] counseling [phone] [address] [place], [place] [address]  [facility] crisis counseling, individual counseling, group therapy, in-home therapy, domestic violence services, day treatment, DWI services, Administrator, arts (CST), Doctor, hospital (ACTT), substance abuse Intensive Outpatient Program (several hours a day, several  times a week)  2 locations - [place] and [place] [phone] [address] [place], [place] [address]  [phone] [address] [place], [place] [address]  [facility]     Individual counseling and group therapy  [employer], [place], [place] [phone] [address] [place], [place]  [facility]  Offers individual and family counseling  Offers LGBTQ services  Accepts private insurance and private pay [phone] [address] [place], [place] [address]  [facility]    Offers individual counseling, group therapy, and outpatient detox  Accepts private insurance [phone] [address] [place], [place]  [facility]  Individual counseling  Accepts Medicare, private insurance [phone] [address] [place], [place] [address]  [employer]  Individual counseling  Accepts Medicare, private insurance [phone] [address] [place], [place] [address]  [facility]   Offers substance abuse Intensive Outpatient Program (several hours a day, several times a week) [phone], or [phone] [place], [place]

## 2016-05-31 NOTE — ED Provider Notes (Signed)
[facility] Emergency Department Provider Note  ____________________________________________  Time seen: 4:20 PM  I have reviewed the triage vital signs and the nursing notes.   HISTORY  Chief Complaint Withdrawal    HPI [name] is a 35 y.o. female reports being brought here by police because they thought she was intoxicated and suspected of driving while impaired. She reports that she has long-standing IV cocaine and heroin use, last use was last night. None today. Denies any acute symptoms. No chest pain shortness of breath nausea vomiting diarrhea belly pain fevers chills or sweats.   Past Medical History  Diagnosis Date  . HCV (hepatitis C virus) 07/17/2013    Last Assessment & Plan:  - positive antibody most recently 08/2014 - hep C RNA 08/2014 was negative - prior to that, HCV RNA [id number] in 06/2013. No record for genotype or imaging.   - patient counseled that she could become re-infected with continued IVDU or unprotected sexual encounters.   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   Last Assessment & Plan:  Discussed routine use of Valtrex prophylaxis at 36 weeks. She will be delivered by cesarean at 36-37 weeks.   . Infection with methicillin-resistant Staphylococcus aureus 03/19/2013    Overview: Arm abscess with MRSA, treated.  Subsequent screening negative  . Tenosynovitis 08/19/2014  . Acute deep vein thrombosis of arm (HCC) 08/19/2014    Last Assessment & Plan:  - diagnosed 08/2014 in hospital - s/p heparin gtt - patient never took her xarelto x 3 month course that was planned - no symptoms today.  No further treatment - we discussed avoiding IVDU (see below) to reduce her chances of provoked upper extremity DVT   . Asthma   . IV drug abuse      Patient Active Problem List   Diagnosis Date Noted  . Sepsis (HCC) 07/10/2015  . Oligohydramnios 05/18/2015  . S/P cesarean section  05/18/2015  . Preterm uterine contractions 05/17/2015  . Oligohydramnios antepartum 05/17/2015  . H/O cesarean section complicating pregnancy 05/17/2015  . Preterm contractions 05/17/2015  . AA (alcohol abuse) 12/08/2014  . Drug abuse during pregnancy 12/08/2014  . High risk sexual behavior 12/08/2014  . Tenosynovitis 08/19/2014  . Acute deep vein thrombosis of arm (HCC) 08/19/2014  . HCV (hepatitis C virus) 07/17/2013  . H/O cesarean section 05/18/2013  . Herpes infection in pregnancy 05/18/2013  . Infection with methicillin-resistant Staphylococcus aureus 03/19/2013  . Cannabis abuse 03/18/2013  . Polysubstance abuse 03/18/2013  . Compulsive tobacco user syndrome 03/17/2013     Past Surgical History  Procedure Laterality Date  . Cesarean section    . Neck surgery      Fusion  . Cesarean section N/A 05/18/2015    Procedure: CESAREAN SECTION;  Surgeon: Hildred Laser, MD;  Location: [facility] ORS;  Service: Obstetrics;  Laterality: N/A;     Current Outpatient Rx  Name  Route  Sig  Dispense  Refill  . cefpodoxime (VANTIN) 200 MG tablet   Oral   Take 1 tablet (200 mg total) by mouth 2 (two) times daily.   20 tablet   0   . docusate sodium (COLACE) 100 MG capsule   Oral   Take 1 capsule (100 mg total) by mouth 2 (two) times daily as needed.   30 capsule   2   . doxycycline (VIBRA-TABS) 100 MG tablet   Oral   Take 1 tablet (100  mg total) by mouth 2 (two) times daily.   20 tablet   1   . enoxaparin (LOVENOX) 40 MG/0.4ML injection   Subcutaneous   Inject 0.4 mLs (40 mg total) into the skin daily.   30 Syringe   1   . ferrous sulfate 325 (65 FE) MG tablet   Oral   Take 1 tablet (325 mg total) by mouth 3 (three) times daily with meals.   90 tablet   3   . ibuprofen (ADVIL,MOTRIN) 800 MG tablet   Oral   Take 1 tablet (800 mg total) by mouth every 8 (eight) hours as needed.   60 tablet   1   . lidocaine (LIDODERM) 5 %   Transdermal   Place 1 patch onto the skin  daily. Remove & Discard patch within 12 hours or as directed by MD   3 patch   0   . oxyCODONE-acetaminophen (PERCOCET/ROXICET) 5-325 MG per tablet   Oral   Take 1-2 tablets by mouth every 6 (six) hours as needed for severe pain (for pain scale greater than 7).   30 tablet   0   . Prenatal Vit-Fe Fumarate-FA (MULTIVITAMIN-PRENATAL) 27-0.8 MG TABS tablet   Oral   Take 1 tablet by mouth daily at 12 noon.         . ranitidine (ZANTAC) 150 MG tablet   Oral   Take 1 tablet (150 mg total) by mouth 2 (two) times daily.   20 tablet   0   . sulfamethoxazole-trimethoprim (BACTRIM DS) 800-160 MG tablet   Oral   Take 1 tablet by mouth 2 (two) times daily.   14 tablet   0      Allergies Amoxicillin; Latex; Vancomycin; and Clindamycin/lincomycin   Family History  Problem Relation Age of Onset  . Diabetes Mother   . COPD Mother   . Hypertension Mother   . Lung cancer Mother   . Alcohol abuse Father   . Lung cancer Maternal Grandmother     Social History Social History  Substance Use Topics  . Smoking status: Current Every Day Smoker -- 1.00 packs/day for 7 years    Types: Cigarettes  . Smokeless tobacco: Never Used  . Alcohol Use: No    Review of Systems  Constitutional:   No fever or chills.  ENT:   No sore throat. No rhinorrhea. Cardiovascular:   No chest pain. Respiratory:   No dyspnea or cough. Gastrointestinal:   Negative for abdominal pain, vomiting and diarrhea.  Genitourinary:   Negative for dysuria or difficulty urinating. Musculoskeletal:   Negative for focal pain or swelling Neurological:   Negative for headaches 10-point ROS otherwise negative.  ____________________________________________   PHYSICAL EXAM:  VITAL SIGNS: ED Triage Vitals  Enc Vitals Group     BP 05/31/16 1503 123/102 mmHg     Pulse Rate 05/31/16 1503 81     Resp 05/31/16 1503 22     Temp 05/31/16 1503 98.1 F (36.7 C)     Temp src --      SpO2 05/31/16 1503 95 %     Weight  05/31/16 1503 120 lb (54.432 kg)     Height 05/31/16 1503 5\' 6"  (1.676 m)     Head Cir --      Peak Flow --      Pain Score --      Pain Loc --      Pain Edu? --      Excl. in GC? --  Vital signs reviewed, nursing assessments reviewed.   Constitutional:   Alert and oriented. Somnolent but easily arousable.  Eyes:   No scleral icterus. No conjunctival pallor. PERRL. EOMI.  No nystagmus. ENT   Head:   Normocephalic and atraumatic.   Nose:   No congestion/rhinnorhea. No septal hematoma   Mouth/Throat:   MMM, no pharyngeal erythema. No peritonsillar mass.    Neck:   No stridor. No SubQ emphysema. No meningismus. Hematological/Lymphatic/Immunilogical:   No cervical lymphadenopathy. Cardiovascular:   RRR. Symmetric bilateral radial and DP pulses.  No murmurs.  Respiratory:   Normal respiratory effort without tachypnea nor retractions. Breath sounds are clear and equal bilaterally. No wheezes/rales/rhonchi. Gastrointestinal:   Soft and nontender. Non distended. There is no CVA tenderness.  No rebound, rigidity, or guarding. Genitourinary:   deferred Musculoskeletal:   Nontender with normal range of motion in all extremities. No joint effusions.  No lower extremity tenderness.  No edema. Normal active bowel sounds Neurologic:   Normal speech and language.  CN 2-10 normal. Motor grossly intact. Patient has frequent jerking movements of bilateral upper extremities without any alteration in level of consciousness..  Skin:    Skin is warm, dry with diffuse track marks on all extremities. There is 2-3 cm area of erythema with a small superficial pustule erupting on the dorsal aspect of the left midfoot. No crepitus, no induration. Appearance is that of a small pustule that is about to spontaneously rupture and drain.  ____________________________________________    LABS (pertinent positives/negatives) (all labs ordered are listed, but only abnormal results are displayed) Labs  Reviewed - No data to display ____________________________________________   EKG    ____________________________________________    RADIOLOGY    ____________________________________________   PROCEDURES   ____________________________________________   INITIAL IMPRESSION / ASSESSMENT AND PLAN / ED COURSE  Pertinent labs & imaging results that were available during my care of the patient were reviewed by me and considered in my medical decision making (see chart for details).  Patient well appearing no acute distress. Has some evidence of skin infection of the left dorsal foot, she is a risk of MRSA infection. We'll treat with Bactrim and Keflex. No other acute findings. No evidence of embolic phenomenon or endocarditis or necrotizing fasciitis. She is currently not intoxicated though she does have some dystonia. Not interested in rehabilitation referral at this time.     ____________________________________________   FINAL CLINICAL IMPRESSION(S) / ED DIAGNOSES  Final diagnoses:  Polysubstance abuse  Abnormal involuntary movements  Cellulitis of foot, left       Portions of this note were generated with dragon dictation software. Dictation errors may occur despite best attempts at proofreading.   Sharman CheekPhillip Senaida Chilcote, MD 05/31/16 [phone]

## 2016-06-18 ENCOUNTER — Ambulatory Visit (HOSPITAL_COMMUNITY)
Admission: RE | Admit: 2016-06-18 | Discharge: 2016-06-18 | Disposition: A | Discharge status: Home / Self Care | Payer: Medicaid Other | Attending: Psychiatry | Admitting: Psychiatry

## 2016-06-18 DIAGNOSIS — F1129 Opioid dependence with unspecified opioid-induced disorder: Secondary | ICD-10-CM | POA: Diagnosis present

## 2016-06-18 DIAGNOSIS — J45909 Unspecified asthma, uncomplicated: Secondary | ICD-10-CM | POA: Diagnosis not present

## 2016-06-18 DIAGNOSIS — F1429 Cocaine dependence with unspecified cocaine-induced disorder: Secondary | ICD-10-CM | POA: Insufficient documentation

## 2016-06-18 DIAGNOSIS — Z86718 Personal history of other venous thrombosis and embolism: Secondary | ICD-10-CM | POA: Insufficient documentation
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
## 2016-07-03 IMAGING — CT CT CHEST W/O CM
1 series · 15 of 31 positions shown, 19 images · non-contrast
Comparison: 07/05/2015 CT.  Plain film of 1 day prior.  Is

CLINICAL DATA: Pneumonia.  Hepatitis-C.  IV drug abuse.

EXAM:
CT CHEST WITHOUT CONTRAST
TECHNIQUE: Multidetector CT imaging of the chest was performed following the
standard protocol without IV contrast.

[Series 2: routine chest wo · axial · 0.64mm/px · z∈[-790,-515]mm · 15 of 61 slices shown, 19 images]
[im 3/61  mediastinal]
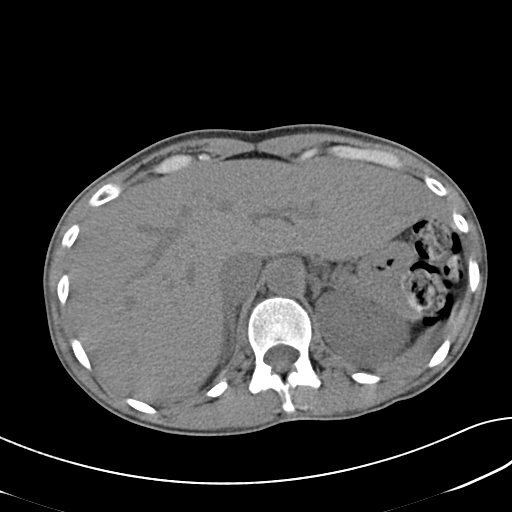
[im 3/61  lung]
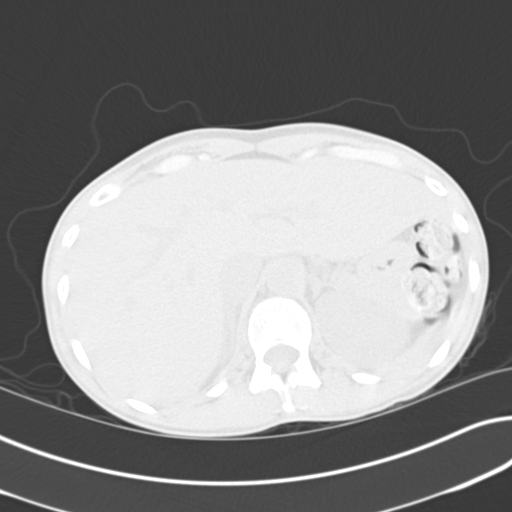
[im 7/61  lung]
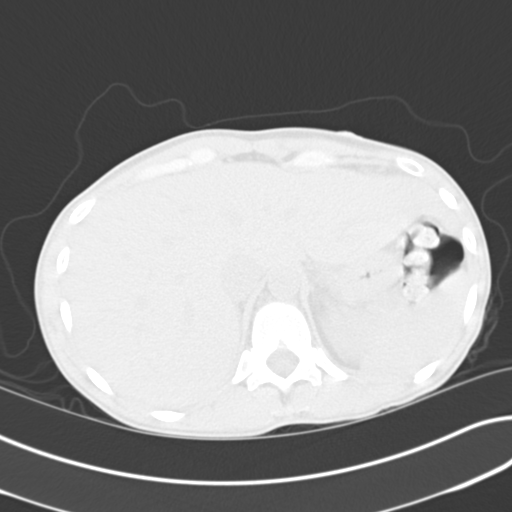
[im 12/61  lung]
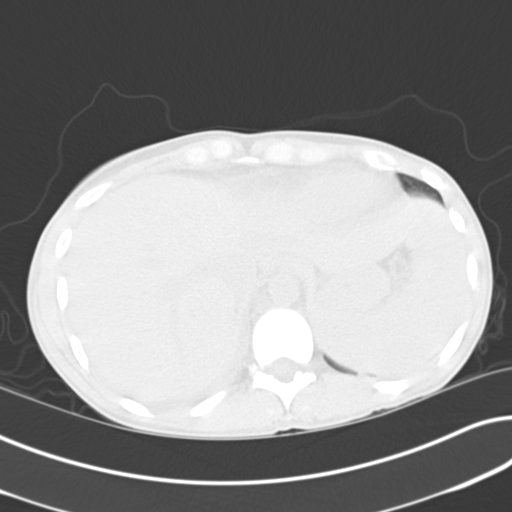
[im 14/61  lung]
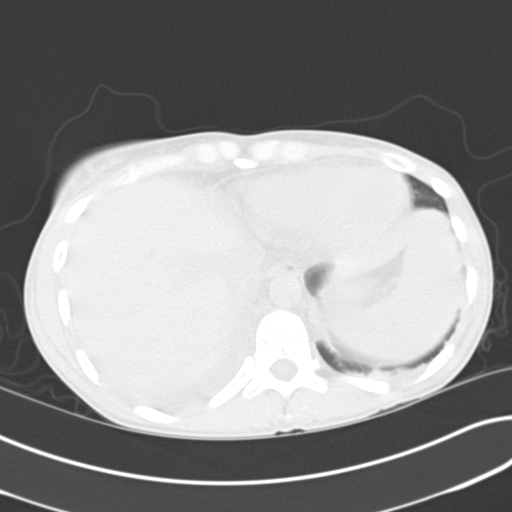
[im 18/61  mediastinal]
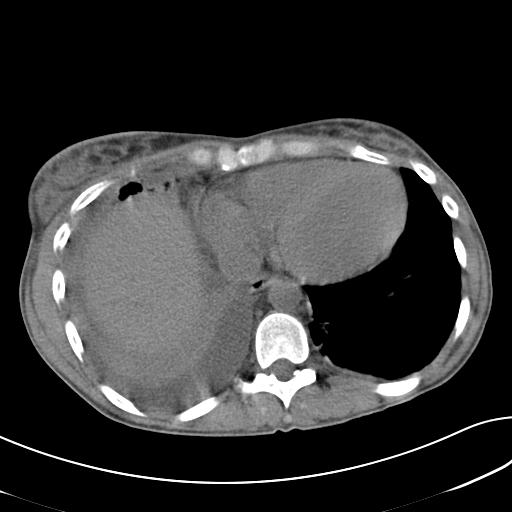
[im 18/61  lung]
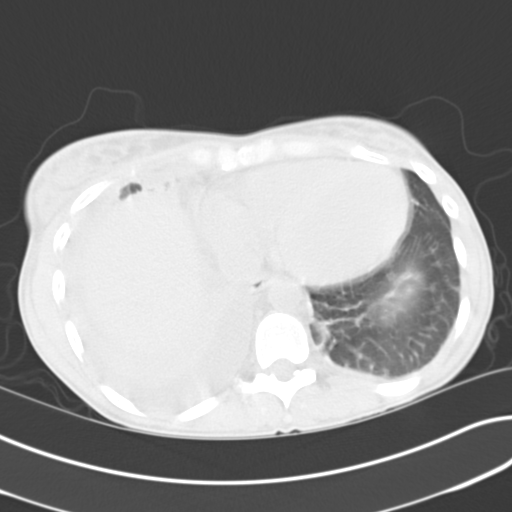
[im 23/61  lung]
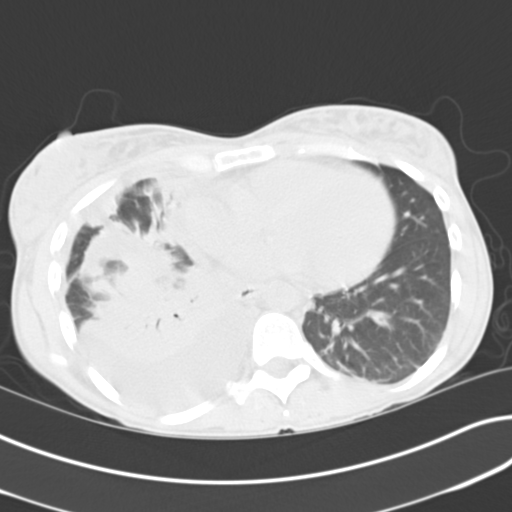
[im 27/61  lung]
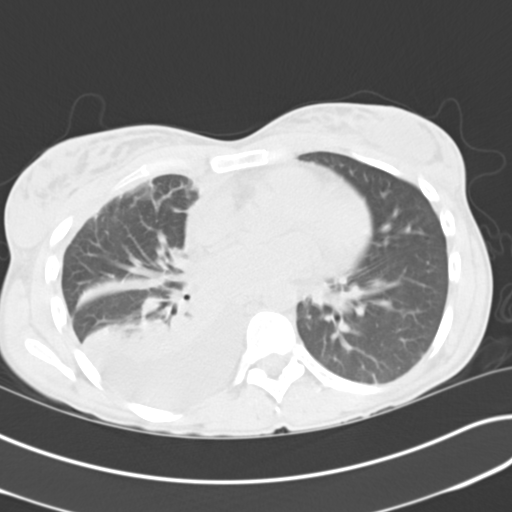
[im 32/61  lung]
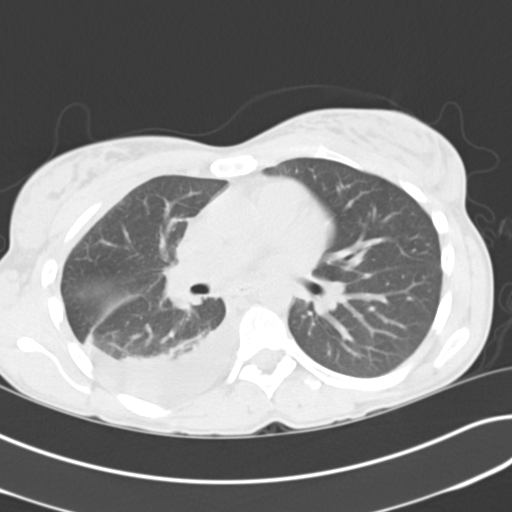
[im 34/61  mediastinal]
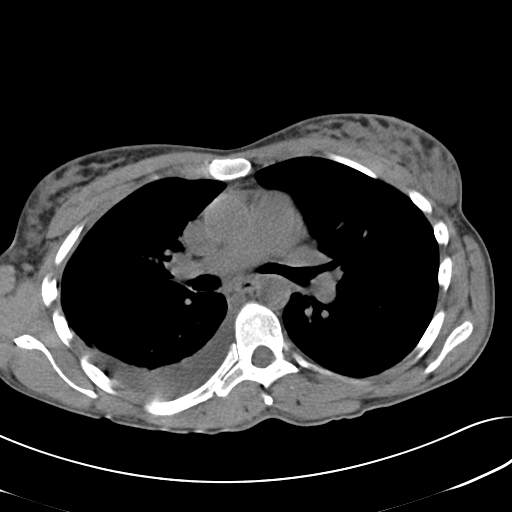
[im 34/61  lung]
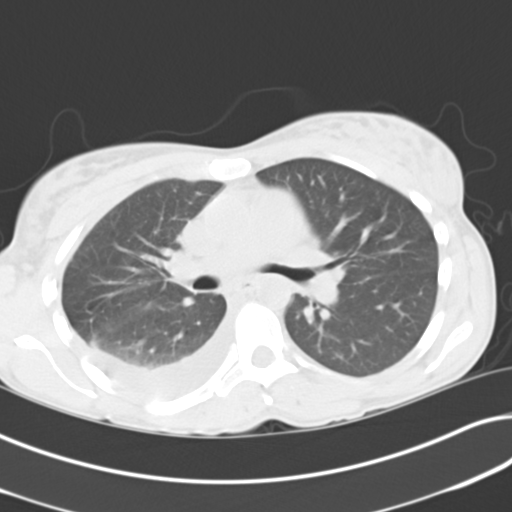
[im 37/61  lung]
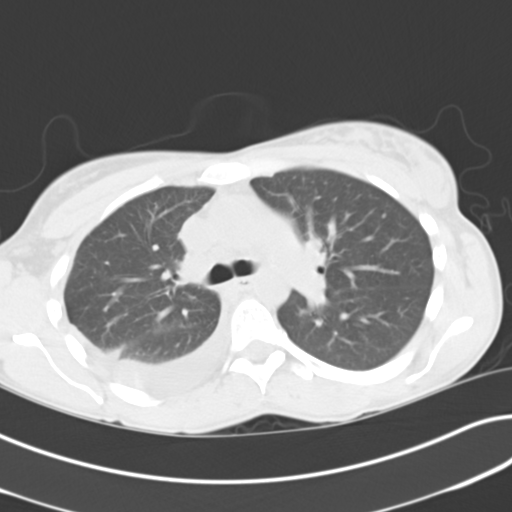
[im 41/61  lung]
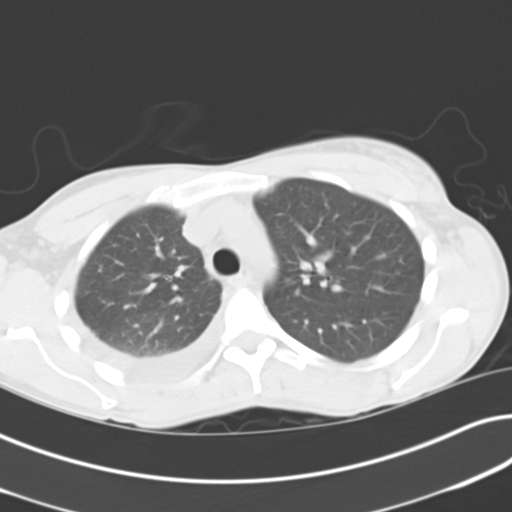
[im 45/61  lung]
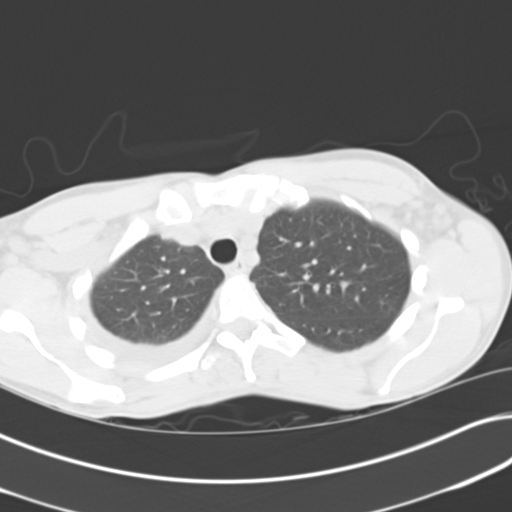
[im 49/61  mediastinal]
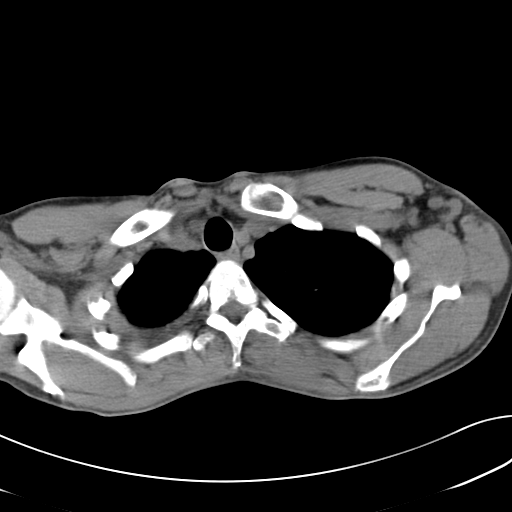
[im 49/61  lung]
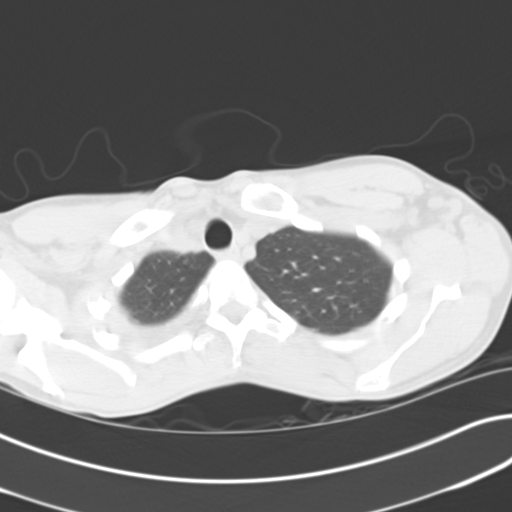
[im 54/61  lung]
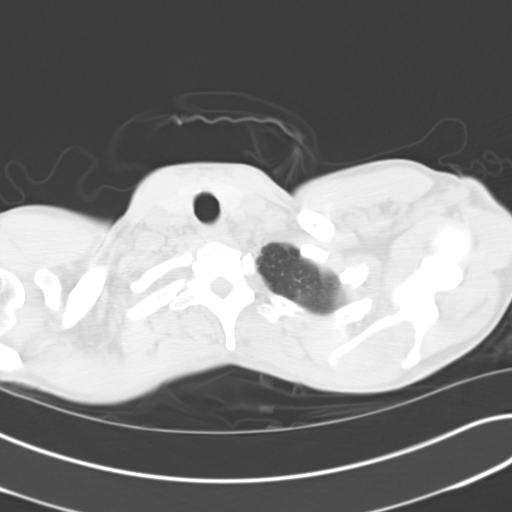
[im 58/61  lung]
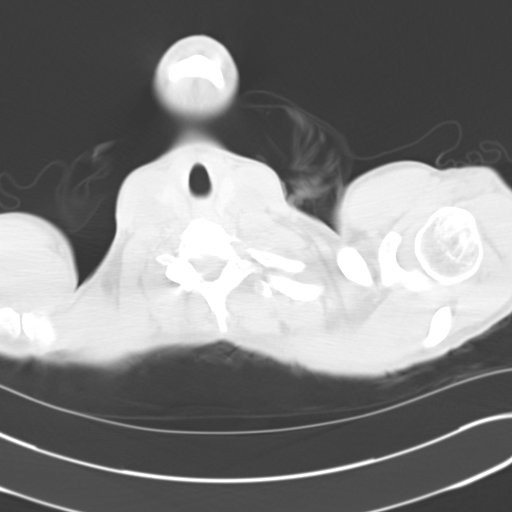

[15 of 31 positions shown; findings below may reference images not displayed]

FINDINGS: Mediastinum/Nodes: Multiple small left axillary nodes are likely
reactive and are similar. Heart size accentuated by a pectus
excavatum deformity. No pericardial effusion. Limited evaluation for
thoracic adenopathy secondary to motion and lack of IV contrast.

Lungs/Pleura: Increase in size of a small right pleural effusion.
Increase in anterior right-sided pleural thickening versus minimal
loculated fluid. Example image 33 of series 2. Mild motion
degradation. Since the prior CT, development of inferior right lower
lobe consolidation with air bronchograms. Dependent right middle
lobe airspace disease also identified. No areas of necrosis or
evidence of septic emboli.

4 mm left upper lobe pulmonary nodule on image 18 is likely new
since the prior exam. Similar findings more inferiorly in the left
upper lobe on image 25 of series 3.

Upper abdomen: Normal imaged portions of the liver, spleen, stomach,
adrenal glands, left kidney.

Musculoskeletal: Lower cervical spine fixation.
IMPRESSION: 1. Significantly increased right lower lobe and new right middle
lobe opacity since 07/05/2015, most consistent with pneumonia.
Increase in small right pleural effusion with anterior right pleural
thickening versus trace loculated fluid, likely secondary.
2. Motion degraded exam.
3. Minimal left upper lobe nodularity is felt to be new since the
recent exam and therefore likely infectious or inflammatory as well.

## 2016-07-04 IMAGING — CR DG CHEST 2V
1 series · 2 of 2 positions shown · non-contrast
Comparison: 07/10/2015

CLINICAL DATA: Followup pneumonia.  History of asthma.

EXAM:
CHEST  2 VIEW

[Series 1: dg chest 2 view · 0.14mm/px · 2 of 2 slices shown]
[im 1/2]
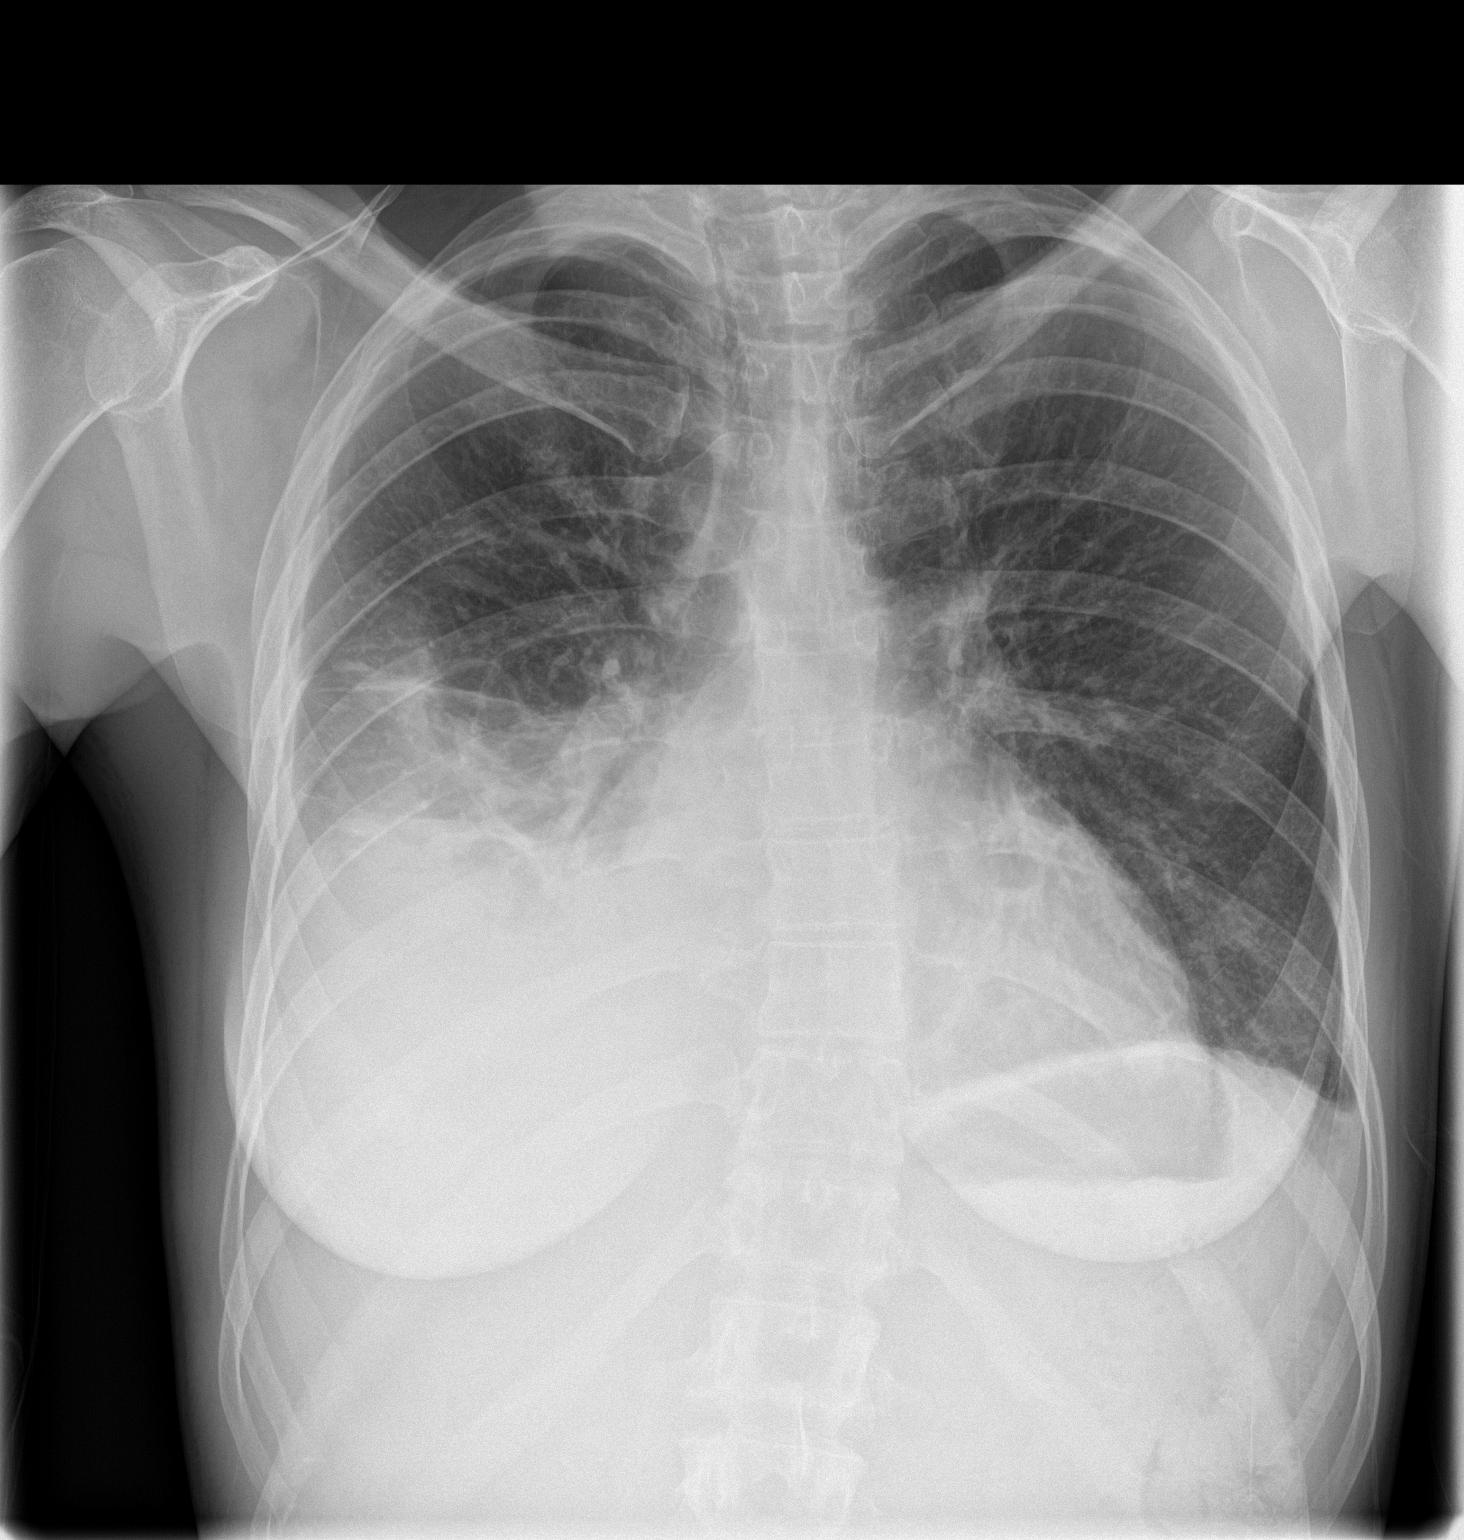
[im 2/2]
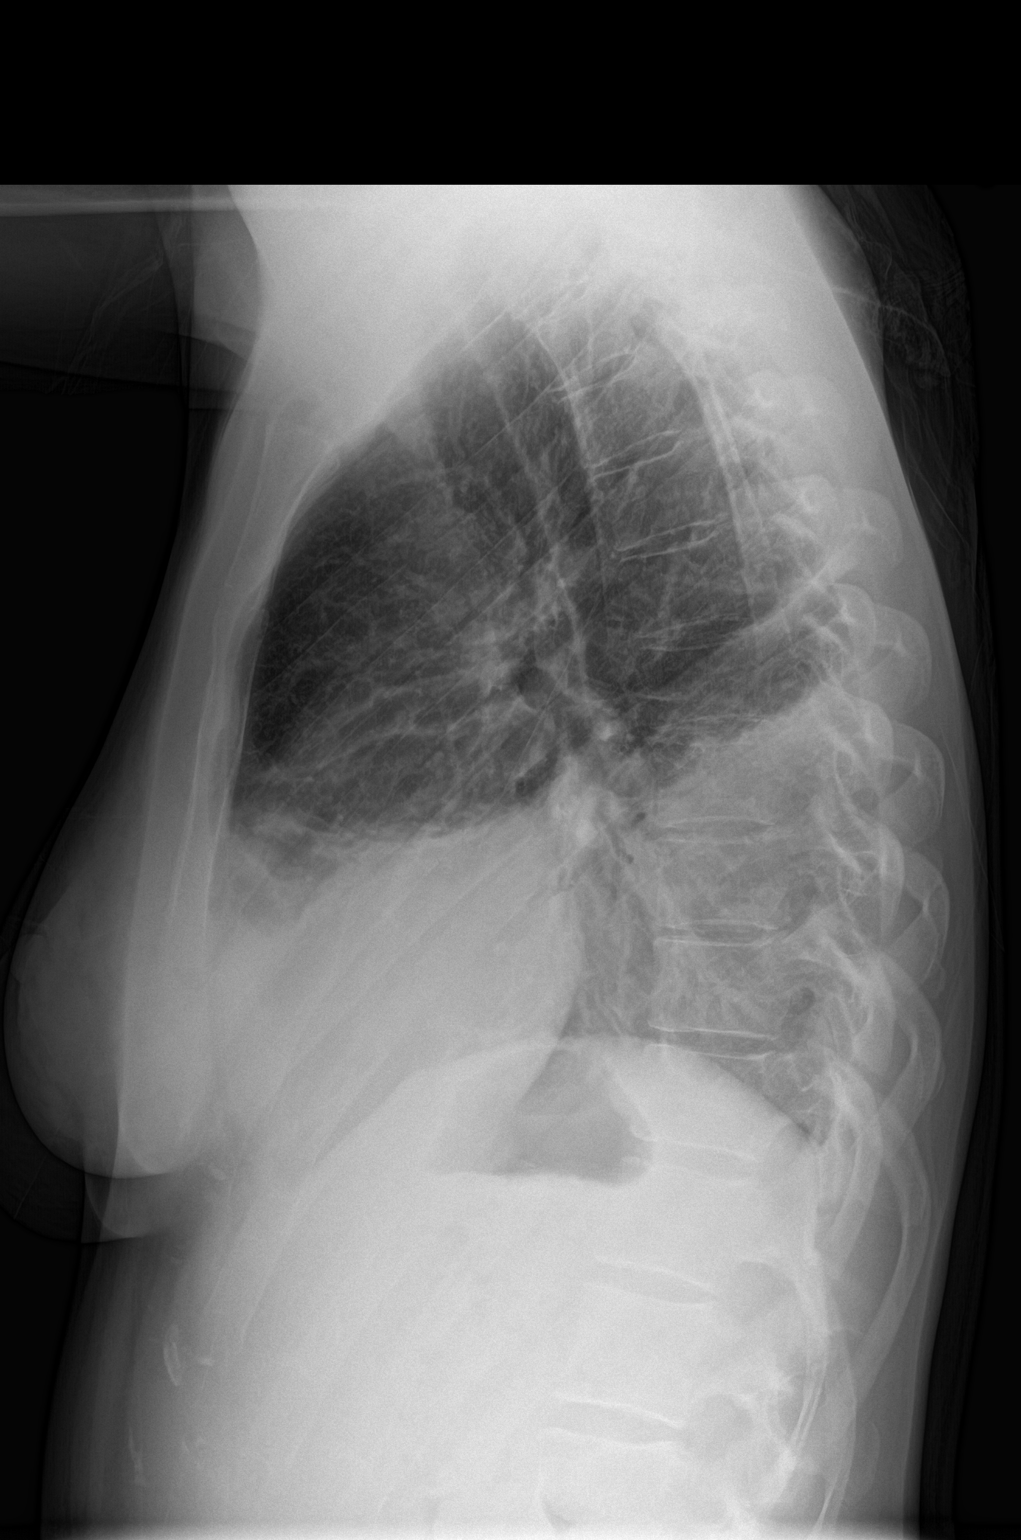

[2 of 2 positions shown; findings below may reference images not displayed]

FINDINGS: Normal heart size. There is no pleural effusion or edema identified.
Bilateral pleural effusions are identified right greater in left.
When compared with the previous exam the right pleural effusion has
increased in volume. There is worsening aeration to the right middle
lobe and right lower lobe.
IMPRESSION: 1. Increase in volume of right pleural effusion with overlying
airspace consolidation and atelectasis.

## 2016-07-16 ENCOUNTER — Emergency Department: Payer: Medicaid Other

## 2016-07-16 ENCOUNTER — Inpatient Hospital Stay: Payer: Medicaid Other

## 2016-07-16 ENCOUNTER — Encounter: Payer: Self-pay | Admitting: Emergency Medicine

## 2016-07-16 ENCOUNTER — Inpatient Hospital Stay
Admission: EM | Admit: 2016-07-16 | Discharge: 2016-07-19 | DRG: 872 | Disposition: A | Discharge status: Home / Self Care | Payer: Medicaid Other | Attending: Internal Medicine | Admitting: Internal Medicine

## 2016-07-16 DIAGNOSIS — F1010 Alcohol abuse, uncomplicated: Secondary | ICD-10-CM | POA: Diagnosis present

## 2016-07-16 DIAGNOSIS — R011 Cardiac murmur, unspecified: Secondary | ICD-10-CM | POA: Diagnosis present

## 2016-07-16 DIAGNOSIS — I38 Endocarditis, valve unspecified: Secondary | ICD-10-CM

## 2016-07-16 DIAGNOSIS — F1110 Opioid abuse, uncomplicated: Secondary | ICD-10-CM | POA: Diagnosis present

## 2016-07-16 DIAGNOSIS — E872 Acidosis: Secondary | ICD-10-CM

## 2016-07-16 DIAGNOSIS — Z86718 Personal history of other venous thrombosis and embolism: Secondary | ICD-10-CM

## 2016-07-16 DIAGNOSIS — L03116 Cellulitis of left lower limb: Secondary | ICD-10-CM | POA: Diagnosis present

## 2016-07-16 DIAGNOSIS — F1410 Cocaine abuse, uncomplicated: Secondary | ICD-10-CM | POA: Diagnosis present

## 2016-07-16 DIAGNOSIS — R4182 Altered mental status, unspecified: Secondary | ICD-10-CM

## 2016-07-16 DIAGNOSIS — F17210 Nicotine dependence, cigarettes, uncomplicated: Secondary | ICD-10-CM | POA: Diagnosis present

## 2016-07-16 DIAGNOSIS — Z881 Allergy status to other antibiotic agents status: Secondary | ICD-10-CM

## 2016-07-16 DIAGNOSIS — F1910 Other psychoactive substance abuse, uncomplicated: Secondary | ICD-10-CM

## 2016-07-16 DIAGNOSIS — Z8249 Family history of ischemic heart disease and other diseases of the circulatory system: Secondary | ICD-10-CM | POA: Diagnosis not present

## 2016-07-16 DIAGNOSIS — R7989 Other specified abnormal findings of blood chemistry: Secondary | ICD-10-CM

## 2016-07-16 DIAGNOSIS — E162 Hypoglycemia, unspecified: Secondary | ICD-10-CM | POA: Diagnosis present

## 2016-07-16 DIAGNOSIS — J45909 Unspecified asthma, uncomplicated: Secondary | ICD-10-CM | POA: Diagnosis present

## 2016-07-16 DIAGNOSIS — I248 Other forms of acute ischemic heart disease: Secondary | ICD-10-CM | POA: Diagnosis present

## 2016-07-16 DIAGNOSIS — R739 Hyperglycemia, unspecified: Secondary | ICD-10-CM | POA: Diagnosis not present

## 2016-07-16 DIAGNOSIS — J189 Pneumonia, unspecified organism: Secondary | ICD-10-CM

## 2016-07-16 DIAGNOSIS — Z833 Family history of diabetes mellitus: Secondary | ICD-10-CM

## 2016-07-16 DIAGNOSIS — E876 Hypokalemia: Secondary | ICD-10-CM | POA: Diagnosis present

## 2016-07-16 DIAGNOSIS — Z801 Family history of malignant neoplasm of trachea, bronchus and lung: Secondary | ICD-10-CM

## 2016-07-16 DIAGNOSIS — D638 Anemia in other chronic diseases classified elsewhere: Secondary | ICD-10-CM | POA: Diagnosis present

## 2016-07-16 DIAGNOSIS — R6520 Severe sepsis without septic shock: Secondary | ICD-10-CM | POA: Diagnosis present

## 2016-07-16 DIAGNOSIS — N179 Acute kidney failure, unspecified: Secondary | ICD-10-CM | POA: Diagnosis present

## 2016-07-16 DIAGNOSIS — R740 Nonspecific elevation of levels of transaminase and lactic acid dehydrogenase [LDH]: Secondary | ICD-10-CM | POA: Diagnosis present

## 2016-07-16 DIAGNOSIS — A419 Sepsis, unspecified organism: Secondary | ICD-10-CM | POA: Diagnosis present

## 2016-07-16 DIAGNOSIS — R401 Stupor: Secondary | ICD-10-CM

## 2016-07-16 DIAGNOSIS — R945 Abnormal results of liver function studies: Secondary | ICD-10-CM

## 2016-07-16 DIAGNOSIS — R030 Elevated blood-pressure reading, without diagnosis of hypertension: Secondary | ICD-10-CM | POA: Diagnosis present

## 2016-07-16 DIAGNOSIS — B1920 Unspecified viral hepatitis C without hepatic coma: Secondary | ICD-10-CM | POA: Diagnosis present

## 2016-07-16 LAB — URINALYSIS COMPLETE WITH MICROSCOPIC (ARMC ONLY)
Bilirubin Urine: NEGATIVE
Glucose, UA: NEGATIVE mg/dL
KETONES UR: NEGATIVE mg/dL
Leukocytes, UA: NEGATIVE
Nitrite: NEGATIVE
PH: 5 (ref 5.0–8.0)
PROTEIN: 100 mg/dL — AB
Specific Gravity, Urine: 1.017 (ref 1.005–1.030)

## 2016-07-16 LAB — LACTIC ACID, PLASMA
LACTIC ACID, VENOUS: 4.2 mmol/L — AB (ref 0.5–1.9)
Lactic Acid, Venous: 0.9 mmol/L (ref 0.5–1.9)

## 2016-07-16 LAB — COMPREHENSIVE METABOLIC PANEL
ALT: 121 U/L — ABNORMAL HIGH (ref 14–54)
AST: 510 U/L — AB (ref 15–41)
Albumin: 3.1 g/dL — ABNORMAL LOW (ref 3.5–5.0)
Alkaline Phosphatase: 80 U/L (ref 38–126)
Anion gap: 13 (ref 5–15)
BUN: 46 mg/dL — AB (ref 6–20)
CHLORIDE: 105 mmol/L (ref 101–111)
CO2: 18 mmol/L — ABNORMAL LOW (ref 22–32)
Calcium: 7.7 mg/dL — ABNORMAL LOW (ref 8.9–10.3)
Creatinine, Ser: 2.66 mg/dL — ABNORMAL HIGH (ref 0.44–1.00)
GFR, EST AFRICAN AMERICAN: 26 mL/min — AB (ref >60)
GFR, EST NON AFRICAN AMERICAN: 22 mL/min — AB (ref >60)
Glucose, Bld: 137 mg/dL — ABNORMAL HIGH (ref 65–99)
POTASSIUM: 3.8 mmol/L (ref 3.5–5.1)
SODIUM: 136 mmol/L (ref 135–145)
Total Bilirubin: 1.4 mg/dL — ABNORMAL HIGH (ref 0.3–1.2)
Total Protein: 6.5 g/dL (ref 6.5–8.1)

## 2016-07-16 LAB — CBC WITH DIFFERENTIAL/PLATELET
BASOS ABS: 0 10*3/uL (ref 0–0.1)
Basophils Relative: 0 %
Eosinophils Absolute: 0 10*3/uL (ref 0–0.7)
Eosinophils Relative: 0 %
HEMATOCRIT: 26.4 % — AB (ref 35.0–47.0)
Hemoglobin: 9 g/dL — ABNORMAL LOW (ref 12.0–16.0)
LYMPHS PCT: 13 %
Lymphs Abs: 1.2 10*3/uL (ref 1.0–3.6)
MCH: 28.5 pg (ref 26.0–34.0)
MCHC: 34.1 g/dL (ref 32.0–36.0)
MCV: 83.4 fL (ref 80.0–100.0)
Monocytes Absolute: 0.4 10*3/uL (ref 0.2–0.9)
Monocytes Relative: 5 %
NEUTROS ABS: 7.8 10*3/uL — AB (ref 1.4–6.5)
Neutrophils Relative %: 82 %
Platelets: 198 10*3/uL (ref 150–440)
RBC: 3.16 MIL/uL — AB (ref 3.80–5.20)
RDW: 15.6 % — ABNORMAL HIGH (ref 11.5–14.5)
WBC: 9.4 10*3/uL (ref 3.6–11.0)

## 2016-07-16 LAB — PROTIME-INR
INR: 1.3
Prothrombin Time: 16.3 seconds — ABNORMAL HIGH (ref 11.4–15.2)

## 2016-07-16 LAB — TROPONIN I: TROPONIN I: 0.13 ng/mL — AB (ref <0.03)

## 2016-07-16 LAB — GLUCOSE, CAPILLARY
Glucose-Capillary: 148 mg/dL — ABNORMAL HIGH (ref 65–99)
Glucose-Capillary: 55 mg/dL — ABNORMAL LOW (ref 65–99)

## 2016-07-16 LAB — MRSA PCR SCREENING: MRSA by PCR: NEGATIVE

## 2016-07-16 MED ORDER — LINEZOLID 600 MG/300ML IV SOLN
600.0000 mg | Freq: Once | INTRAVENOUS | Status: DC
  Administered 2016-07-16: 600 mg via INTRAVENOUS
  Filled 2016-07-16: qty 300

## 2016-07-16 MED ORDER — DEXTROSE 5 % IV SOLN
2.0000 g | INTRAVENOUS | Status: DC
  Filled 2016-07-16: qty 2

## 2016-07-16 MED ORDER — DEXTROSE 5 % IV SOLN
2.0000 g | Freq: Once | INTRAVENOUS | Status: AC
  Administered 2016-07-16: 2 g via INTRAVENOUS
  Filled 2016-07-16: qty 2

## 2016-07-16 MED ORDER — PNEUMOCOCCAL VAC POLYVALENT 25 MCG/0.5ML IJ INJ
0.5000 mL | INJECTION | INTRAMUSCULAR | Status: DC
  Filled 2016-07-16: qty 0.5

## 2016-07-16 MED ORDER — DEXTROSE IN LACTATED RINGERS 5 % IV SOLN
INTRAVENOUS | Status: DC
  Administered 2016-07-16 – 2016-07-17 (×3): via INTRAVENOUS
  Administered 2016-07-18: 100 mL/h via INTRAVENOUS

## 2016-07-16 MED ORDER — ACETAMINOPHEN 650 MG RE SUPP: 650.0000 mg | RECTAL | Status: DC | PRN

## 2016-07-16 MED ORDER — HEPARIN SODIUM (PORCINE) 5000 UNIT/ML IJ SOLN
5000.0000 [IU] | Freq: Three times a day (TID) | INTRAMUSCULAR | Status: DC
Start: 2016-07-16 — End: 2016-07-19
  Administered 2016-07-16 – 2016-07-19 (×8): 5000 [IU] via SUBCUTANEOUS
  Filled 2016-07-16 (×8): qty 1

## 2016-07-16 MED ORDER — DEXTROSE 50 % IV SOLN
INTRAVENOUS | Status: AC
  Administered 2016-07-16: 50 mL via INTRAVENOUS
  Filled 2016-07-16: qty 50

## 2016-07-16 MED ORDER — ACETAMINOPHEN 650 MG RE SUPP
650.0000 mg | Freq: Once | RECTAL | Status: AC
  Administered 2016-07-16: 650 mg via RECTAL
  Filled 2016-07-16: qty 1

## 2016-07-16 MED ORDER — SODIUM CHLORIDE 0.9 % IV BOLUS (SEPSIS)
1000.0000 mL | Freq: Once | INTRAVENOUS | Status: AC
  Administered 2016-07-16: 1000 mL via INTRAVENOUS

## 2016-07-16 MED ORDER — LORAZEPAM 2 MG/ML IJ SOLN
INTRAMUSCULAR | Status: AC
  Administered 2016-07-16: 17:00:00
  Filled 2016-07-16: qty 1

## 2016-07-16 MED ORDER — LINEZOLID 600 MG/300ML IV SOLN
600.0000 mg | Freq: Two times a day (BID) | INTRAVENOUS | Status: DC
  Administered 2016-07-16 – 2016-07-17 (×3): 600 mg via INTRAVENOUS
  Filled 2016-07-16 (×5): qty 300

## 2016-07-16 MED ORDER — SODIUM CHLORIDE 0.9 % IV BOLUS (SEPSIS)
1000.0000 mL | Freq: Once | INTRAVENOUS | Status: AC
Start: 2016-07-16 — End: 2016-07-16
  Administered 2016-07-16: 1000 mL via INTRAVENOUS

## 2016-07-16 MED ORDER — DEXTROSE 50 % IV SOLN
1.0000 | Freq: Once | INTRAVENOUS | Status: AC
  Administered 2016-07-16: 50 mL via INTRAVENOUS

## 2016-07-16 MED ORDER — ACETAMINOPHEN 325 MG PO TABS: 650.0000 mg | ORAL_TABLET | ORAL | Status: DC | PRN

## 2016-07-16 MED ORDER — SODIUM CHLORIDE 0.9 % IV SOLN: 250.0000 mL | INTRAVENOUS | Status: DC | PRN

## 2016-07-16 MED ORDER — DEXTROSE 5 % IV SOLN: 2.0000 g | INTRAVENOUS | Status: DC

## 2016-07-16 NOTE — ED Notes (Signed)
cbg 55 ,D50 pushed at this time by Silvio Claymanonald RN

## 2016-07-16 NOTE — ED Notes (Signed)
Lab called for critical toponin 0.13 and lac acid 4.2 , MD notified

## 2016-07-16 NOTE — H&P (Signed)
PULMONARY / CRITICAL CARE MEDICINE   Name: [name] MRN: [id number] DOB: 01-30-1981    ADMISSION DATE:  07/16/2016  REFERRING MD:  Gerarda Gunther, ED  PT PROFILE:  33 F with hx of polysubstance abuse including IVDA admitted via ED with fever, AMS, AKI, heart murmur, suspected bacterial endocarditis. Recent hospitalization for R sided PNA and pleural effusion. During that admission, a TTE revealed no vegetations, blood cultures were negative and a heart murmur was documented by Dr Sampson Goon. She has a history of MRSA skin abscesses. She has unspecified allergies to vancomycin and PCN  MAJOR EVENTS/TEST RESULTS: 09/03 CT head:   INDWELLING DEVICES:: L IJ CVL 09/03 >>   MICRO DATA: MRSA PCR 09/03 >>  Urine 09/03 >>  Blood 09/03 >>   ANTIMICROBIALS:  Linezolid 09/03 >>  Cefepime 09/03 >>   HISTORY OF PRESENT ILLNESS:   As above. Pt is presently unable to provide any further history due to AMS  PAST MEDICAL HISTORY :  She  has a past medical history of Acute deep vein thrombosis of arm (HCC) (08/19/2014); Asthma; HCV (hepatitis C virus) (07/17/2013); Herpes infection in pregnancy (05/18/2013); Infection with methicillin-resistant Staphylococcus aureus (03/19/2013); IV drug abuse; and Tenosynovitis (08/19/2014).  PAST SURGICAL HISTORY: She  has a past surgical history that includes Cesarean section; Neck surgery; and Cesarean section (N/A, 05/18/2015).  Allergies  Allergen Reactions  . Amoxicillin Rash  . Latex   . Vancomycin   . Clindamycin/Lincomycin Rash    No current facility-administered medications on file prior to encounter.    Current Outpatient Prescriptions on File Prior to Encounter  Medication Sig  . cefpodoxime (VANTIN) 200 MG tablet Take 1 tablet (200 mg total) by mouth 2 (two) times daily. (Patient not taking: Reported on 07/16/2016)  . cephALEXin (KEFLEX) 500 MG capsule Take 1 capsule (500 mg total) by mouth 3 (three) times daily. (Patient not taking: Reported on  07/16/2016)  . docusate sodium (COLACE) 100 MG capsule Take 1 capsule (100 mg total) by mouth 2 (two) times daily as needed.  . doxycycline (VIBRA-TABS) 100 MG tablet Take 1 tablet (100 mg total) by mouth 2 (two) times daily. (Patient not taking: Reported on 07/16/2016)  . enoxaparin (LOVENOX) 40 MG/0.4ML injection Inject 0.4 mLs (40 mg total) into the skin daily. (Patient not taking: Reported on 07/16/2016)  . ferrous sulfate 325 (65 FE) MG tablet Take 1 tablet (325 mg total) by mouth 3 (three) times daily with meals.  Marland Kitchen ibuprofen (ADVIL,MOTRIN) 800 MG tablet Take 1 tablet (800 mg total) by mouth every 8 (eight) hours as needed. (Patient not taking: Reported on 07/16/2016)  . lidocaine (LIDODERM) 5 % Place 1 patch onto the skin daily. Remove & Discard patch within 12 hours or as directed by MD (Patient not taking: Reported on 07/16/2016)  . oxyCODONE-acetaminophen (PERCOCET/ROXICET) 5-325 MG per tablet Take 1-2 tablets by mouth every 6 (six) hours as needed for severe pain (for pain scale greater than 7).  . ranitidine (ZANTAC) 150 MG tablet Take 1 tablet (150 mg total) by mouth 2 (two) times daily.  Marland Kitchen sulfamethoxazole-trimethoprim (BACTRIM DS) 800-160 MG tablet Take 1 tablet by mouth 2 (two) times daily. (Patient not taking: Reported on 07/16/2016)    FAMILY HISTORY:  Her indicated that her mother is alive. She indicated that her father is alive. She indicated that the status of her maternal grandmother is unknown.    SOCIAL HISTORY: She  reports that she has been smoking Cigarettes.  She has a 7.00  pack-year smoking history. She has never used smokeless tobacco. She reports that she uses drugs, including Marijuana, Cocaine, and Heroin. She reports that she does not drink alcohol.  REVIEW OF SYSTEMS:   Level 5 caveat  SUBJECTIVE:    VITAL SIGNS: BP (!) 99/54   Pulse 86   Temp 97.3 F (36.3 C)   Resp 17   SpO2 100%   HEMODYNAMICS:    VENTILATOR SETTINGS:    INTAKE / OUTPUT: No  intake/output data recorded.  PHYSICAL EXAMINATION: General: Disheveled, RASS -2, -3, not F/C Neuro: PERRL, EOMI, tongue midline, MAEs, withdraws from pain, DTRs symmetric HEENT: NCAT, nonicteric, no scleral hemorrhages Cardiovascular: Reg, II-III/VI crescendo-decrescendo murmur in aoritc region and radiating to neck Lungs: clear to auscultation and percussion Abdomen: soft, NT, diminished BS Ext: B hands are edematous, no LE edema, cannot assess nailbeds due to nail polish Skin: innumerable diffuse irregular areas of erythema   LABS:  BMET  Recent Labs Lab 07/16/16 1650  NA 136  K 3.8  CL 105  CO2 18*  BUN 46*  CREATININE 2.66*  GLUCOSE 137*    Electrolytes  Recent Labs Lab 07/16/16 1650  CALCIUM 7.7*    CBC  Recent Labs Lab 07/16/16 1650  WBC 9.4  HGB 9.0*  HCT 26.4*  PLT 198    Coag's  Recent Labs Lab 07/16/16 1650  INR 1.30    Sepsis Markers  Recent Labs Lab 07/16/16 1650  LATICACIDVEN 4.2*    ABG No results for input(s): PHART, PCO2ART, PO2ART in the last 168 hours.  Liver Enzymes  Recent Labs Lab 07/16/16 1650  AST 510*  ALT 121*  ALKPHOS 80  BILITOT 1.4*  ALBUMIN 3.1*    Cardiac Enzymes  Recent Labs Lab 07/16/16 1650  TROPONINI 0.13*    Glucose  Recent Labs Lab 07/16/16 1657 07/16/16 1720  GLUCAP 55* 148*    CXR: borderline CM, no infiltrates, edema or effusions notes  DISCUSSION:   ASSESSMENT / PLAN: Severe sepsis Drug abuse, IV Obtundation AKI Alcohol abuse Elevated lactic acid level HCV (hepatitis C virus) Elevated LFTs  Vancomycin allergy - unknown reaction PCM allergy - unknown reaction  INFECTIOUS A:   Severe sepsis - no clear source. Recent hospitalization. Concern for endocarditis P:   Monitor temp, WBC count Micro and abx as above If blood cultures positive, consider repeat TTE or TEE  PULMONARY A: Risk of airway compromise due to AMS Smoker Recent PNA with pleural effusion P:    Monitor with oximetry and capnography Supplemental O2 PRN to maintain SpO2 > 92%  CARDIOVASCULAR A:  Elevated serum lactate due to severe sepsis with normal BP Heart murmur P:  Recheck lactate in 3 hours Monitor BP and rhythm  RENAL A:   AKI - likely due to sepsis P:   Monitor BMET intermittently Monitor I/Os Correct electrolytes as indicated Maintenance IVFs ordered  GASTROINTESTINAL A:   Hx of hep C Elevated LFTs P:   SUP: N/I NPO until cognition permits oral intake Monitor LFTs intermittently  HEMATOLOGIC A:   Mild anemia without acute blood loos P:  DVT px: SQ heparin Monitor CBC intermittently Transfuse per usual guidelines  ENDOCRINE A:   Mild hyperglycemia P:   Monitor glu on chem panels Consider SSI if glu > 180  NEUROLOGIC A:   Altered mental status/obtundation - likely septic and/or toxic Polysubstance abuse - tobacco, EtOH, opiates, cocaine P:   RASS goal: 0 Minimize sedatives Consider methadone for withdrawal symptoms   FAMILY: No  family available   CCM time: 45 mins  The above time includes time spent in consultation with patient and/or family members and reviewing care plan on multidisciplinary rounds  Billy Fischeravid Daneya Hartgrove, MD PCCM service Mobile [phone] Pager [phone]     07/16/2016, 6:48 PM

## 2016-07-16 NOTE — ED Triage Notes (Addendum)
Pt to ED via EMS from home , per EMS pt was combative, axillary temp 104, BP 158/84, CBG was 43, EMS gave 1mg  glucagon , EMS also gave 5mg  haldol IM en route. Per family pt recently left rehab for heroin. Pt has obvious track marks and bruising throughout.Pt was found to be in [facility].  Pt restless., Pt denies any drug use today. Pt alert at this time, answering to questions appropriately

## 2016-07-16 NOTE — ED Notes (Addendum)
MD at bedside placing central line

## 2016-07-16 NOTE — ED Notes (Signed)
Per pt , states bookcase fell on her yesterday when asked about bruising .

## 2016-07-16 NOTE — Progress Notes (Signed)
Pharmacy Antibiotic Note  [name] is a 35 y.o. female admitted on 07/16/2016 with sepsis and concern for possible endocarditis.  Pharmacy has been consulted for ceftazidime dosing. Patient is also receiving Linezolid IV every 12 hours (vanc allergy)  Plan: Will start the patient on Ceftazidime 2gm IV every 24 hours based on current CrCl <2530min/ml. Will monitor renal function and adjust dose as needed.   Height: 5\' 6"  (167.6 cm) Weight: 132 lb 15 oz (60.3 kg) IBW/kg (Calculated) : 59.3  Temp (24hrs), Avg:100.3 F (37.9 C), Min:97.3 F (36.3 C), Max:103.5 F (39.7 C)   Recent Labs Lab 07/16/16 1650  WBC 9.4  CREATININE 2.66*  LATICACIDVEN 4.2*    Estimated Creatinine Clearance: 28.2 mL/min (by C-G formula based on SCr of 2.66 mg/dL).    Allergies  Allergen Reactions  . Amoxicillin Rash  . Latex   . Vancomycin   . Clindamycin/Lincomycin Rash    Antimicrobials this admission: 9/3 Linezolid >>  9/3 Ceftazidime >>   Microbiology results: 9/3 BCx: pending 9/3 UCx: pending 9/3 MRSA PCR: pending  Thank you for allowing pharmacy to be a part of this patient's care.  Samantha NakaiSheema Elyanna Arias, PharmD Clinical Pharmacist 07/16/2016 7:44 PM

## 2016-07-16 NOTE — ED Provider Notes (Signed)
[facility] Emergency Department Provider Note   ____________________________________________   First MD Initiated Contact with Patient 07/16/16 1636     (approximate)  I have reviewed the triage vital signs and the nursing notes.   HISTORY  Chief Complaint Altered Mental Status and Drug Overdose  EM caveat: Patient with altered mental status, confusion unable to recall events  HPI [name] is a 35 y.o. female presents for evaluation of found with a high fever, weak.  Patient presents for evaluation per EMS, the patient recently left heroin rehabilitation and was found today with altered mental status and fever to 104.4.  The patient herself, after receiving 2 mg Ativan and improvement in her choreoathetoid type movements was able to report that she left rehabilitation and was noting that she had a fever at that time. She's had increasing weakness, achiness everywhere, and a cough which has been productive.  She specifically denies having a headache, does have a cough, no chest pain. Denies abdominal pain and pregnancy.   Past Medical History:  Diagnosis Date  . Acute deep vein thrombosis of arm (HCC) 08/19/2014   Last Assessment & Plan:  - diagnosed 08/2014 in hospital - s/p heparin gtt - patient never took her xarelto x 3 month course that was planned - no symptoms today.  No further treatment - we discussed avoiding IVDU (see below) to reduce her chances of provoked upper extremity DVT   . Asthma   . HCV (hepatitis C virus) 07/17/2013   Last Assessment & Plan:  - positive antibody most recently 08/2014 - hep C RNA 08/2014 was negative - prior to that, HCV RNA [id number] in 06/2013. No record for genotype or imaging.   - patient counseled that she could become re-infected with continued IVDU or unprotected sexual encounters.   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   Last Assessment & Plan:   Discussed routine use of Valtrex prophylaxis at 36 weeks. She will be delivered by cesarean at 36-37 weeks.   . Infection with methicillin-resistant Staphylococcus aureus 03/19/2013   Overview: Arm abscess with MRSA, treated.  Subsequent screening negative  . IV drug abuse   . Tenosynovitis 08/19/2014    Patient Active Problem List   Diagnosis Date Noted  . Severe sepsis (HCC) 07/16/2016  . AKI (acute kidney injury) (HCC) 07/16/2016  . Obtundation 07/16/2016  . Elevated lactic acid level 07/16/2016  . Sepsis (HCC) 07/10/2015  . Oligohydramnios 05/18/2015  . S/P cesarean section 05/18/2015  . Preterm uterine contractions 05/17/2015  . Oligohydramnios antepartum 05/17/2015  . H/O cesarean section complicating pregnancy 05/17/2015  . Preterm contractions 05/17/2015  . AA (alcohol abuse) 12/08/2014  . Drug abuse during pregnancy 12/08/2014  . High risk sexual behavior 12/08/2014  . Tenosynovitis 08/19/2014  . Acute deep vein thrombosis of arm (HCC) 08/19/2014  . HCV (hepatitis C virus) 07/17/2013  . H/O cesarean section 05/18/2013  . Herpes infection in pregnancy 05/18/2013  . Infection with methicillin-resistant Staphylococcus aureus 03/19/2013  . Cannabis abuse 03/18/2013  . Polysubstance abuse 03/18/2013  . Compulsive tobacco user syndrome 03/17/2013    Past Surgical History:  Procedure Laterality Date  . CESAREAN SECTION    . CESAREAN SECTION N/A 05/18/2015   Procedure: CESAREAN SECTION;  Surgeon: Hildred Laser, MD;  Location: [facility] ORS;  Service: Obstetrics;  Laterality: N/A;  . NECK SURGERY     Fusion    Prior to Admission medications  Medication Sig Start Date End Date Taking? Authorizing Provider  cefpodoxime (VANTIN) 200 MG tablet Take 1 tablet (200 mg total) by mouth 2 (two) times daily. Patient not taking: Reported on 07/16/2016 07/13/15   Smith Robert Bacon Menshew, PA-C  cephALEXin (KEFLEX) 500 MG capsule Take 1 capsule (500 mg total) by mouth 3 (three) times  daily. Patient not taking: Reported on 07/16/2016 05/31/16   Sharman Cheek, MD  docusate sodium (COLACE) 100 MG capsule Take 1 capsule (100 mg total) by mouth 2 (two) times daily as needed. 05/21/15   Hildred Laser, MD  doxycycline (VIBRA-TABS) 100 MG tablet Take 1 tablet (100 mg total) by mouth 2 (two) times daily. Patient not taking: Reported on 07/16/2016 03/06/16   Ignacia Bayley, PA-C  enoxaparin (LOVENOX) 40 MG/0.4ML injection Inject 0.4 mLs (40 mg total) into the skin daily. Patient not taking: Reported on 07/16/2016 05/21/15   Hildred Laser, MD  ferrous sulfate 325 (65 FE) MG tablet Take 1 tablet (325 mg total) by mouth 3 (three) times daily with meals. 05/21/15   Hildred Laser, MD  ibuprofen (ADVIL,MOTRIN) 800 MG tablet Take 1 tablet (800 mg total) by mouth every 8 (eight) hours as needed. Patient not taking: Reported on 07/16/2016 05/21/15   Hildred Laser, MD  lidocaine (LIDODERM) 5 % Place 1 patch onto the skin daily. Remove & Discard patch within 12 hours or as directed by MD Patient not taking: Reported on 07/16/2016 05/21/15   Hildred Laser, MD  oxyCODONE-acetaminophen (PERCOCET/ROXICET) 5-325 MG per tablet Take 1-2 tablets by mouth every 6 (six) hours as needed for severe pain (for pain scale greater than 7). 05/21/15   Hildred Laser, MD  ranitidine (ZANTAC) 150 MG tablet Take 1 tablet (150 mg total) by mouth 2 (two) times daily. 07/13/15   Jenise V Bacon Menshew, PA-C  sulfamethoxazole-trimethoprim (BACTRIM DS) 800-160 MG tablet Take 1 tablet by mouth 2 (two) times daily. Patient not taking: Reported on 07/16/2016 05/31/16   Sharman Cheek, MD  Unclear if patient is compliant with any of the above medications  Allergies Amoxicillin; Latex; Vancomycin; and Clindamycin/lincomycin  Family History  Problem Relation Age of Onset  . Diabetes Mother   . COPD Mother   . Hypertension Mother   . Lung cancer Mother   . Alcohol abuse Father   . Lung cancer Maternal Grandmother     Social History Social History   Substance Use Topics  . Smoking status: Current Every Day Smoker    Packs/day: 1.00    Years: 7.00    Types: Cigarettes  . Smokeless tobacco: Never Used  . Alcohol use No    Review of Systems EM caveat  ____________________________________________   PHYSICAL EXAM:  VITAL SIGNS: ED Triage Vitals  Enc Vitals Group     BP 07/16/16 1652 (!) 152/67     Pulse Rate 07/16/16 1635 (!) 139     Resp 07/16/16 1635 (!) 25     Temp 07/16/16 1635 (!) 103.5 F (39.7 C)     Temp Source 07/16/16 1635 Rectal     SpO2 07/16/16 1652 92 %     Weight --      Height --      Head Circumference --      Peak Flow --      Pain Score --      Pain Loc --      Pain Edu? --      Excl. in GC? --     Constitutional: Pale, diaphoretic, and  critically ill-appearing on arrival. Maintaining good respirations, the patient is noted  Eyes: Conjunctivae are normal. PERRL. EOMI. Head: Atraumatic. Nose: No congestion/rhinnorhea. Mouth/Throat: Mucous membranes are moist.  Oropharynx non-erythematous. Neck: No stridor.   Cardiovascular: Tachycardic rate, regular rhythm. A moderate holosystolic murmur is noted Good peripheral circulation. Respiratory: Moderate risk tachypnea, good ventilations, protecting airway well.  No retractions. Lungs CTAB. Gastrointestinal: Soft and nontender. No distention.  Musculoskeletal: No lower extremity tenderness nor edema.  No joint effusions. Neurologic:  Normal speech and language. No gross focal neurologic deficits are appreciated. No gait instability. Skin:  Skin is warm, and intact. A somewhat ecchymotic, lacy appearing rash is noted over multiple extremities and the patient is cool peripherally but warm to touch over the central region of the body Psychiatric: The patient exhibits normal orientation, but presents with choreoathetoid type movements. Patient moving in all extremities, but alert and able to speak. No evidence of seizure activity. After receiving 2 mg of  Ativan, and Haldol which was given by EMS, the patient columns, is resting and is able to hold normal conversation.  ____________________________________________   LABS (all labs ordered are listed, but only abnormal results are displayed)  Labs Reviewed  LACTIC ACID, PLASMA - Abnormal; Notable for the following:       Result Value   Lactic Acid, Venous 4.2 (*)    All other components within normal limits  COMPREHENSIVE METABOLIC PANEL - Abnormal; Notable for the following:    CO2 18 (*)    Glucose, Bld 137 (*)    BUN 46 (*)    Creatinine, Ser 2.66 (*)    Calcium 7.7 (*)    Albumin 3.1 (*)    AST 510 (*)    ALT 121 (*)    Total Bilirubin 1.4 (*)    GFR calc non Af Amer 22 (*)    GFR calc Af Amer 26 (*)    All other components within normal limits  TROPONIN I - Abnormal; Notable for the following:    Troponin I 0.13 (*)    All other components within normal limits  CBC WITH DIFFERENTIAL/PLATELET - Abnormal; Notable for the following:    RBC 3.16 (*)    Hemoglobin 9.0 (*)    HCT 26.4 (*)    RDW 15.6 (*)    Neutro Abs 7.8 (*)    All other components within normal limits  PROTIME-INR - Abnormal; Notable for the following:    Prothrombin Time 16.3 (*)    All other components within normal limits  URINALYSIS COMPLETEWITH MICROSCOPIC ([facility] ONLY) - Abnormal; Notable for the following:    Color, Urine YELLOW (*)    APPearance CLOUDY (*)    Hgb urine dipstick 3+ (*)    Protein, ur 100 (*)    Bacteria, UA RARE (*)    Squamous Epithelial / LPF 0-5 (*)    All other components within normal limits  GLUCOSE, CAPILLARY - Abnormal; Notable for the following:    Glucose-Capillary 55 (*)    All other components within normal limits  GLUCOSE, CAPILLARY - Abnormal; Notable for the following:    Glucose-Capillary 148 (*)    All other components within normal limits  CULTURE, BLOOD (ROUTINE X 2)  CULTURE, BLOOD (ROUTINE X 2)  URINE CULTURE  CBC  BASIC METABOLIC PANEL  MAGNESIUM    PHOSPHORUS   ____________________________________________  EKG  Reviewed and interpreted by me at 1655 Heart rate 1:30 QRS 90 QTc 4:30 Reviewed and interpreted sinus tachycardia no  ischemic T waves noted ____________________________________________  RADIOLOGY  Dg Chest Portable 1 View  Result Date: 07/16/2016 CLINICAL DATA:  Central line placement EXAM: PORTABLE CHEST 1 VIEW COMPARISON:  Portable exam 1717 hours compared to 07/16/2016 FINDINGS: LEFT jugular central venous catheter with tip projecting over SVC. Minimal enlargement of cardiac silhouette with slight pulmonary vascular congestion. Mediastinal contours and pulmonary vascularity normal. Mild persistent accentuation of perihilar markings with central peribronchial thickening. Remaining lungs clear. No pleural effusion or pneumothorax. Prior cervical spine fusion. IMPRESSION: No pneumothorax following LEFT jugular line placement. Minimal bronchitic changes and questionable perihilar infiltrates. Electronically Signed   By: Ulyses Southward M.D.   On: 07/16/2016 17:41   Dg Chest Port 1 View  Result Date: 07/16/2016 CLINICAL DATA:  Poor historian- pt understands commands but she is unable to communicate with [place] nor hold still for image. Pt was just released from rehab from heroin use. Pt has 104.0 temp axillary. Fell and hit head yesterday EXAM: PORTABLE CHEST 1 VIEW COMPARISON:  07/30/2015 FINDINGS: Heart size is accentuated by the technique. There is question of left perihilar infiltrate warranting further evaluation. There is no pulmonary edema. No pleural effusions. IMPRESSION: Question of left perihilar infiltrate. PA and lateral view of the chest recommended for further characterization. Electronically Signed   By: Norva Pavlov M.D.   On: 07/16/2016 17:03    ____________________________________________   PROCEDURES  Procedure(s) performed: central line, see procedure note(s).  CENTRAL LINE Performed by: Sharyn Creamer Consent: The procedure was performed in an emergent situation. Required items: required blood products, implants, devices, and special equipment available Patient identity confirmed: arm band and provided demographic data Time out: Immediately prior to procedure a "time out" was called to verify the correct patient, procedure, equipment, support staff and site/side marked as required. Indications: vascular access Anesthesia: local infiltration Local anesthetic: lidocaine 1% with epinephrine Anesthetic total: 3 ml Patient sedated: no Preparation: skin prepped with 2% chlorhexidine Skin prep agent dried: skin prep agent completely dried prior to procedure Sterile barriers: all five maximum sterile barriers used - cap, mask, sterile gown, sterile gloves, and large sterile sheet Hand hygiene: hand hygiene performed prior to central venous catheter insertion  Location details: Left internal jugular vein  Catheter type: triple lumen Catheter size: 8 Fr Pre-procedure: landmarks identified Ultrasound guidance: Yes  Successful placement: yes Post-procedure: line sutured and dressing applied Assessment: blood return through all parts, free fluid flow, placement verified by x-ray and no pneumothorax on x-ray Patient tolerance: Patient tolerated the procedure well with no immediate complications.   Procedures  Critical Care performed: Yes, see critical care note(s)  CRITICAL CARE Performed by: Sharyn Creamer   Total critical care time: 60 minutes  Critical care time was exclusive of separately billable procedures and treating other patients.  Critical care was necessary to treat or prevent imminent or life-threatening deterioration.  Critical care was time spent personally by me on the following activities: development of treatment plan with patient and/or surrogate as well as nursing, discussions with consultants, evaluation of patient's response to treatment, examination of patient,  obtaining history from patient or surrogate, ordering and performing treatments and interventions, ordering and review of laboratory studies, ordering and review of radiographic studies, pulse oximetry and re-evaluation of patient's condition.  ____________________________________________   INITIAL IMPRESSION / ASSESSMENT AND PLAN / ED COURSE  Pertinent labs & imaging results that were available during my care of the patient were reviewed by me and considered in my medical decision making (see chart  for details).  The patient presents with concerns of severe sepsis, potentially associated with a new onset holosystolic murmur. She is a known IV drug user, and extremely difficult IV access which initially delayed labs as a central line required to be placed. She was noted to be hypoglycemic on arrival, she was given glucagon with EMS, and after receiving D50 here blood sugar improved. Her mental status improved after Ativan, and she was exhibiting potential choreoathetoid type movements. No evidence of seizure. She denies headache or neck stiffness, and I am concerned about the degree of fever with a new onset murmur for possible endocarditis. Discussed with pharmacy and based on patient's allergies she is being covered broadly with multiple antibiotics, discussed with Dr. Sherene Sires (CCM at 515pm) and due to the patient's hemodynamic instability including tachycardia, high fevers, and projected clinical course request for critical care consult and ICU evaluation for admission placed.  Clinical Course   ED Sepsis - Repeat Assessment   Performed at:    07/16/2016 at 5:40 PM  Last Vitals:    Blood pressure (!) 148/102, pulse (!) 134, temperature (!) 101.2 F (38.4 C), resp. rate (!) 25, SpO2 92 %, not currently breastfeeding.  Heart:      Holosystolic murmur, heart rate improving  Lungs:     Clear bilateral, mild tachypnea  Capillary Refill:   Normal less than 2 seconds  Peripheral Pulse (include  location): Right radial   Skin (include color):   Warm, now normal in color  ----------------------------------------- 6:00 PM on 07/16/2016 -----------------------------------------  Dr. Sung Amabile (CCM) at bedside.  Discussed with intensivist, who agrees with admission to the ICU and notes that CT the head also ordered by his service. We discussed high risk for possible bacterial endocarditis as a possible etiology for appears to be severe sepsis today. Source still not sure the identified, though pneumonia is also considered primary concern at this point endocarditis. Patient denied headache neck pain or neck stiffness do not believe high risk for encephalitis or meningitis based on today's presentation, though broad-spectrum antibiotics administered. ____________________________________________   FINAL CLINICAL IMPRESSION(S) / ED DIAGNOSES  Final diagnoses:  Severe sepsis (HCC)  Systolic ejection murmur  Community acquired pneumonia      NEW MEDICATIONS STARTED DURING THIS VISIT:  New Prescriptions   No medications on file     Note:  This document was prepared using Dragon voice recognition software and may include unintentional dictation errors.     Sharyn Creamer, MD 07/16/16 [phone]

## 2016-07-17 ENCOUNTER — Inpatient Hospital Stay: Payer: Medicaid Other

## 2016-07-17 LAB — URINE DRUG SCREEN, QUALITATIVE (ARMC ONLY)
AMPHETAMINES, UR SCREEN: NOT DETECTED
BENZODIAZEPINE, UR SCRN: NOT DETECTED
Barbiturates, Ur Screen: NOT DETECTED
Cannabinoid 50 Ng, Ur ~~LOC~~: NOT DETECTED
Cocaine Metabolite,Ur ~~LOC~~: POSITIVE — AB
MDMA (ECSTASY) UR SCREEN: NOT DETECTED
METHADONE SCREEN, URINE: NOT DETECTED
Opiate, Ur Screen: POSITIVE — AB
PHENCYCLIDINE (PCP) UR S: NOT DETECTED
TRICYCLIC, UR SCREEN: NOT DETECTED

## 2016-07-17 LAB — BASIC METABOLIC PANEL
Anion gap: 6 (ref 5–15)
BUN: 38 mg/dL — AB (ref 6–20)
CALCIUM: 7.5 mg/dL — AB (ref 8.9–10.3)
CHLORIDE: 108 mmol/L (ref 101–111)
CO2: 22 mmol/L (ref 22–32)
CREATININE: 1.83 mg/dL — AB (ref 0.44–1.00)
GFR calc Af Amer: 41 mL/min — ABNORMAL LOW (ref >60)
GFR calc non Af Amer: 35 mL/min — ABNORMAL LOW (ref >60)
Glucose, Bld: 104 mg/dL — ABNORMAL HIGH (ref 65–99)
Potassium: 3 mmol/L — ABNORMAL LOW (ref 3.5–5.1)
SODIUM: 136 mmol/L (ref 135–145)

## 2016-07-17 LAB — HEPATIC FUNCTION PANEL
ALBUMIN: 2.7 g/dL — AB (ref 3.5–5.0)
ALK PHOS: 70 U/L (ref 38–126)
ALT: 121 U/L — AB (ref 14–54)
AST: 443 U/L — ABNORMAL HIGH (ref 15–41)
Bilirubin, Direct: <0.1 mg/dL — ABNORMAL LOW (ref 0.1–0.5)
TOTAL PROTEIN: 5.9 g/dL — AB (ref 6.5–8.1)
Total Bilirubin: 0.8 mg/dL (ref 0.3–1.2)

## 2016-07-17 LAB — CBC
HCT: 25.7 % — ABNORMAL LOW (ref 35.0–47.0)
HEMOGLOBIN: 8.7 g/dL — AB (ref 12.0–16.0)
MCH: 28.4 pg (ref 26.0–34.0)
MCHC: 33.8 g/dL (ref 32.0–36.0)
MCV: 84 fL (ref 80.0–100.0)
Platelets: 147 10*3/uL — ABNORMAL LOW (ref 150–440)
RBC: 3.06 MIL/uL — ABNORMAL LOW (ref 3.80–5.20)
RDW: 16.1 % — AB (ref 11.5–14.5)
WBC: 8.4 10*3/uL (ref 3.6–11.0)

## 2016-07-17 LAB — GLUCOSE, CAPILLARY: Glucose-Capillary: 87 mg/dL (ref 65–99)

## 2016-07-17 LAB — MAGNESIUM: MAGNESIUM: 2.3 mg/dL (ref 1.7–2.4)

## 2016-07-17 LAB — PHOSPHORUS: Phosphorus: 4.1 mg/dL (ref 2.5–4.6)

## 2016-07-17 LAB — TROPONIN I
TROPONIN I: 0.03 ng/mL — AB (ref <0.03)
Troponin I: 0.04 ng/mL (ref <0.03)

## 2016-07-17 MED ORDER — ORAL CARE MOUTH RINSE
15.0000 mL | Freq: Two times a day (BID) | OROMUCOSAL | Status: DC
  Administered 2016-07-17 – 2016-07-18 (×4): 15 mL via OROMUCOSAL

## 2016-07-17 MED ORDER — DEXTROSE 5 % IV SOLN
1.0000 g | Freq: Two times a day (BID) | INTRAVENOUS | Status: DC
  Administered 2016-07-17 (×2): 1 g via INTRAVENOUS
  Filled 2016-07-17 (×4): qty 1

## 2016-07-17 MED ORDER — POTASSIUM CHLORIDE 10 MEQ/50ML IV SOLN
10.0000 meq | INTRAVENOUS | Status: AC
  Administered 2016-07-17 (×6): 10 meq via INTRAVENOUS
  Filled 2016-07-17 (×6): qty 50

## 2016-07-17 NOTE — Progress Notes (Signed)
Patient tolerated AS, ordered clear liquid diet per conversation with MD. No orders for testing at this time. Troponin draw via central line and new cap placed.

## 2016-07-17 NOTE — Progress Notes (Signed)
Pt awake and alert as of this time, feeling hungry and asked for food. Pt is on clear liquids but stated she wanted solid food and she is "starving". Per report from [employer], pt tolerated clear liquids very well this afternoon. [facility] doc Hugelmyer paged and informed, ordered to change diet to regular. Pt was given snack tray, tolerated well. No nausea or vomiting noted.

## 2016-07-17 NOTE — Progress Notes (Signed)
PULMONARY / CRITICAL CARE MEDICINE   Name: [name] MRN: [id number] DOB: 10/11/1981    ADMISSION DATE:  07/16/2016  REFERRING MD:  [name], ED  PT PROFILE:  [age] F with hx of polysubstance abuse including IVDA admitted via ED with fever, AMS, AKI, heart murmur, suspected bacterial endocarditis. Recent hospitalization for R sided PNA and pleural effusion. During that admission, a TTE revealed no vegetations, blood cultures were negative and a heart murmur was documented by Dr [name]. She has a history of MRSA skin abscesses. She has unspecified allergies to vancomycin and PCN  MAJOR EVENTS/TEST RESULTS: 09/03 CT head:   INDWELLING DEVICES:: L IJ CVL 09/03 >>   MICRO DATA: MRSA PCR 09/03 >>  Urine 09/03 >>  Blood 09/03 >>   ANTIMICROBIALS:  Linezolid 09/03 >>  Cefepime 09/03 >>   HISTORY OF PRESENT ILLNESS:   As above. Pt is presently unable to provide any further history due to AMS  SUBJECTIVE: Somnolent and confused. No acute issues overnight   VITAL SIGNS: BP (!) 103/51   Pulse 70   Temp 97.9 F (36.6 C) (Axillary)   Resp 14   Ht 5\' 6"  (1.676 m)   Wt 132 lb 0.9 oz (59.9 kg)   SpO2 100%   Breastfeeding? No   BMI 21.31 kg/m   HEMODYNAMICS:    VENTILATOR SETTINGS:    INTAKE / OUTPUT: I/O last 3 completed shifts: In: -  Out: 30 [Urine:30]  PHYSICAL EXAMINATION: General: Disheveled, RASS -2, -3, not F/C Neuro: PERRL, EOMI, tongue midline, MAEs, awakens to voice and pain, DTRs symmetric HEENT: NCAT, nonicteric, no scleral hemorrhages Cardiovascular: Reg, II-III/VI crescendo-decrescendo murmur in aoritc region and radiating to neck Lungs: clear to auscultation and percussion Abdomen: soft, NT, diminished BS Ext: B hands are edematous, no LE edema, cannot assess nailbeds due to nail polish Skin: innumerable diffuse irregular areas of erythema   LABS:  BMET  Recent Labs Lab 07/16/16 1650 07/17/16 0519  NA 136 136  K 3.8 3.0*  CL 105 108   CO2 18* 22  BUN 46* 38*  CREATININE 2.66* 1.83*  GLUCOSE 137* 104*    Electrolytes  Recent Labs Lab 07/16/16 1650 07/17/16 0519  CALCIUM 7.7* 7.5*  MG  --  2.3  PHOS  --  4.1    CBC  Recent Labs Lab 07/16/16 1650 07/17/16 0519  WBC 9.4 8.4  HGB 9.0* 8.7*  HCT 26.4* 25.7*  PLT 198 147*    Coag's  Recent Labs Lab 07/16/16 1650  INR 1.30    Sepsis Markers  Recent Labs Lab 07/16/16 1650 07/16/16 2040  LATICACIDVEN 4.2* 0.9    ABG No results for input(s): PHART, PCO2ART, PO2ART in the last 168 hours.  Liver Enzymes  Recent Labs Lab 07/16/16 1650 07/17/16 0519  AST 510* 443*  ALT 121* 121*  ALKPHOS 80 70  BILITOT 1.4* 0.8  ALBUMIN 3.1* 2.7*    Cardiac Enzymes  Recent Labs Lab 07/16/16 1650  TROPONINI 0.13*    Glucose  Recent Labs Lab 07/16/16 1657 07/16/16 1720  GLUCAP 55* 148*    CXR: borderline CM, no infiltrates, edema or effusions notes  DISCUSSION:   ASSESSMENT / PLAN: Severe sepsis Drug abuse, IV Obtundation-improved AKI Alcohol abuse Elevated lactic acid level HCV (hepatitis C virus) Elevated LFTs  Vancomycin allergy - unknown reaction PCM allergy - unknown reaction Hypokalemia  INFECTIOUS A:   Severe sepsis - no clear source. Recent hospitalization. Concern for endocarditis P:   Monitor temp,  WBC count Micro and abx as above If blood cultures positive, consider repeat TTE or TEE  PULMONARY A: Risk of airway compromise due to AMS Smoker Recent PNA with pleural effusion P:   Monitor with oximetry and capnography Supplemental O2 PRN to maintain SpO2 > 92%  CARDIOVASCULAR A:  Elevated serum lactate due to severe sepsis with normal BP-Lactic acid level trending down Heart murmur P:  IV hydration Monitor BP and rhythm  RENAL A:   AKI - likely due to sepsis P:   Monitor BMET intermittently Monitor I/Os Correct electrolytes as indicated Maintenance IVFs ordered  GASTROINTESTINAL A:   Hx of  hep C Elevated LFTs P:   SUP: N/I NPO until cognition permits oral intake Monitor LFTs intermittently  HEMATOLOGIC A:   Mild anemia without acute blood loos P:  DVT px: SQ heparin Monitor CBC intermittently Transfuse per usual guidelines  ENDOCRINE A:   Mild hyperglycemia P:   Monitor glucose on chem panels Consider SSI if glu > 180  NEUROLOGIC A:   Altered mental status/obtundation - likely septic and/or toxic Polysubstance abuse - tobacco, EtOH, opiates, cocaine P:   RASS goal: 0 Minimize sedatives Consider methadone for withdrawal symptoms   FAMILY: No family available   CCM time: 35 mins   [name] ANP-BC Pulmonary and Critical Care Medicine [facility] Pager [phone] or [phone]    07/17/2016, 6:18 AM  Patient seen with NP, agree with assessment and plan. 35 year old female admitted with the fever, sepsis. Blood cultures negative thus far, echocardiogram pending, due to presence of a murmur. Continue empiric antibiotics.  [name] M.D. 07/17/2016   Critical Care Attestation.  I have personally obtained a history, examined the patient, evaluated laboratory and imaging results, formulated the assessment and plan and placed orders. The Patient requires high complexity decision making for assessment and support, frequent evaluation and titration of therapies, application of advanced monitoring technologies and extensive interpretation of multiple databases. The patient has critical illness that could lead imminently to failure of 1 or more organ systems and requires the highest level of physician preparedness to intervene.  Critical Care Time devoted to patient care services described in this note is 35 minutes and is exclusive of time spent in procedures.

## 2016-07-17 NOTE — Progress Notes (Signed)
Telephone conversation with MD, Elveria Risinghamachandra, enquired about a repeat troponin, Tox screen, when pt to be assigned [facility] doc, and diet order. Per conversation, new order for troponin q 6 hr, my start on clear liquid, tox screen.

## 2016-07-17 NOTE — Progress Notes (Signed)
eLink Physician-Brief Progress Note Patient Name: [name] DOB: 06/27/1981 MRN: [id number]   Date of Service  07/17/2016  HPI/Events of Note  Hypokalemia  eICU Interventions  Potassium replaced     Intervention Category Intermediate Interventions: Electrolyte abnormality - evaluation and management  DETERDING,ELIZABETH 07/17/2016, 5:59 AM

## 2016-07-17 NOTE — Progress Notes (Signed)
Pharmacy Antibiotic Note  [name] is a 35 y.o. female admitted on 07/16/2016 with sepsis and concern for possible endocarditis.  Pharmacy has been consulted for ceftazidime dosing. Patient is also receiving Linezolid IV every 12 hours (vanc allergy)  Plan: Will change the patient to  Ceftazidime 1 gm IV every 12 hours based on current CrCl >4830min/ml. Will monitor renal function and adjust dose as needed.   Height: 5\' 6"  (167.6 cm) Weight: 132 lb 0.9 oz (59.9 kg) IBW/kg (Calculated) : 59.3  Temp (24hrs), Avg:99 F (37.2 C), Min:97.3 F (36.3 C), Max:103.5 F (39.7 C)   Recent Labs Lab 07/16/16 1650 07/16/16 2040 07/17/16 0519  WBC 9.4  --  8.4  CREATININE 2.66*  --  1.83*  LATICACIDVEN 4.2* 0.9  --     Estimated Creatinine Clearance: 40.9 mL/min (by C-G formula based on SCr of 1.83 mg/dL).    Allergies  Allergen Reactions  . Amoxicillin Rash  . Latex   . Vancomycin   . Clindamycin/Lincomycin Rash    Antimicrobials this admission: 9/3 Linezolid >>  9/3 Ceftazidime >>   Microbiology results: 9/3 BCx: pending 9/3 UCx: pending 9/3 MRSA PCR: pending  Thank you for allowing pharmacy to be a part of this patient's care.  Cher NakaiSheema Hallaji, PharmD Clinical Pharmacist 07/17/2016 8:29 AM

## 2016-07-18 ENCOUNTER — Inpatient Hospital Stay
Admit: 2016-07-18 | Discharge: 2016-07-18 | Disposition: A | Discharge status: Home / Self Care | Payer: Medicaid Other | Attending: Internal Medicine | Admitting: Internal Medicine

## 2016-07-18 LAB — URINE CULTURE: Culture: 10000 — AB

## 2016-07-18 LAB — CBC
HCT: 25.8 % — ABNORMAL LOW (ref 35.0–47.0)
Hemoglobin: 9 g/dL — ABNORMAL LOW (ref 12.0–16.0)
MCH: 28.7 pg (ref 26.0–34.0)
MCHC: 34.8 g/dL (ref 32.0–36.0)
MCV: 82.3 fL (ref 80.0–100.0)
PLATELETS: 152 10*3/uL (ref 150–440)
RBC: 3.13 MIL/uL — ABNORMAL LOW (ref 3.80–5.20)
RDW: 16.1 % — ABNORMAL HIGH (ref 11.5–14.5)
WBC: 5.5 10*3/uL (ref 3.6–11.0)

## 2016-07-18 LAB — TROPONIN I: Troponin I: 0.03 ng/mL (ref <0.03)

## 2016-07-18 LAB — BASIC METABOLIC PANEL
Anion gap: 2 — ABNORMAL LOW (ref 5–15)
BUN: 19 mg/dL (ref 6–20)
CHLORIDE: 108 mmol/L (ref 101–111)
CO2: 27 mmol/L (ref 22–32)
CREATININE: 1.23 mg/dL — AB (ref 0.44–1.00)
Calcium: 8 mg/dL — ABNORMAL LOW (ref 8.9–10.3)
GFR calc Af Amer: >60 mL/min (ref >60)
GFR calc non Af Amer: 57 mL/min — ABNORMAL LOW (ref >60)
GLUCOSE: 115 mg/dL — AB (ref 65–99)
Potassium: 3.3 mmol/L — ABNORMAL LOW (ref 3.5–5.1)
SODIUM: 137 mmol/L (ref 135–145)

## 2016-07-18 LAB — ECHOCARDIOGRAM COMPLETE
Height: 66 in
WEIGHTICAEL: 2113.6 [oz_av]

## 2016-07-18 MED ORDER — POTASSIUM CHLORIDE CRYS ER 20 MEQ PO TBCR
40.0000 meq | EXTENDED_RELEASE_TABLET | Freq: Once | ORAL | Status: AC
  Administered 2016-07-18: 40 meq via ORAL
  Filled 2016-07-18: qty 2

## 2016-07-18 MED ORDER — DOXYCYCLINE HYCLATE 100 MG PO TABS
100.0000 mg | ORAL_TABLET | Freq: Two times a day (BID) | ORAL | Status: DC
  Administered 2016-07-18 (×2): 100 mg via ORAL
  Filled 2016-07-18 (×2): qty 1

## 2016-07-18 NOTE — Progress Notes (Signed)
Patient's BP 166/92 at afternoon check.Room is dark and patient resting peacefully alone in the room. Patient inquired about what time she will be discharged in am.  Patient's BP has been increasing throughout the day. Notified MD. New orders to notify MD if SBP greater than 180, or DBP greater than 100. Modified vital sign order. Will continue to monitor patient.

## 2016-07-18 NOTE — Care Management (Signed)
Patient transferred out of icu to 1A on 9.4.17.  Echo is pending.  Blood cultures are negative so far.  IV antibiotics discontinued and switched to oral.  Patient with known IV drug drug abuse.  02 is acute.  Discussed weaning with primary nurse

## 2016-07-18 NOTE — Progress Notes (Signed)
Patient ID: [name], female   DOB: 18-Jul-1981, 35 y.o.   MRN: [id number]  [facility] PROGRESS NOTE  [name]:[id number] DOB: [birth year] DOA: 07/16/2016 PCP: No PCP Per Patient  HPI/Subjective: Patient feels okay. Came in with altered mental status. Patient states that she used heroin last week. Mental status much improved. Patient also had cellulitis of the left lower extremity. Patient also has pain in the right upper tooth posteriorly. Patient complains of being swollen.  Objective: Vitals:   07/18/16 0406 07/18/16 0900  BP: 130/71 (!) 145/98  Pulse: 70 71  Resp: 19 18  Temp: 98.1 F (36.7 C) 98 F (36.7 C)    Filed Weights   07/16/16 1900 07/17/16 0437 07/18/16 0404  Weight: 60.3 kg (132 lb 15 oz) 59.9 kg (132 lb 0.9 oz) 59.9 kg (132 lb 1.6 oz)    ROS: Review of Systems  Constitutional: Negative for chills and fever.  Eyes: Negative for blurred vision.  Respiratory: Negative for cough and shortness of breath.   Cardiovascular: Negative for chest pain.  Gastrointestinal: Negative for abdominal pain, constipation, diarrhea, nausea and vomiting.  Genitourinary: Negative for dysuria.  Musculoskeletal: Negative for joint pain.  Neurological: Negative for dizziness and headaches.   Exam: Physical Exam  Constitutional: She is oriented to person, place, and time.  HENT:  Nose: No mucosal edema.  Mouth/Throat: No oropharyngeal exudate or posterior oropharyngeal edema.  Eyes: Conjunctivae, EOM and lids are normal. Pupils are equal, round, and reactive to light.  Neck: No JVD present. Carotid bruit is not present. No edema present. No thyroid mass and no thyromegaly present.  Cardiovascular: S1 normal and S2 normal.  Exam reveals no gallop.   Murmur heard.  Systolic murmur is present with a grade of 3/6  Pulses:      Dorsalis pedis pulses are 2+ on the right side, and 2+ on the left side.  Respiratory: No respiratory distress. She has no wheezes. She  has no rhonchi. She has no rales.  GI: Soft. Bowel sounds are normal. There is no tenderness.  Musculoskeletal:       Right wrist: She exhibits swelling.       Left wrist: She exhibits swelling.       Right ankle: She exhibits swelling.       Left ankle: She exhibits swelling.  Lymphadenopathy:    She has no cervical adenopathy.  Neurological: She is alert and oriented to person, place, and time. No cranial nerve deficit.  Skin: Skin is warm. Nails show no clubbing.  Numerous bruises over the arms and legs in different stages. Numerous scabs over the legs in different stages. Cuts on the left wrist. Not seeing much erythema on the legs at all.  Psychiatric: She has a normal mood and affect.      Data Reviewed: Basic Metabolic Panel:  Recent Labs Lab 07/16/16 1650 07/17/16 0519 07/18/16 0318  NA 136 136 137  K 3.8 3.0* 3.3*  CL 105 108 108  CO2 18* 22 27  GLUCOSE 137* 104* 115*  BUN 46* 38* 19  CREATININE 2.66* 1.83* 1.23*  CALCIUM 7.7* 7.5* 8.0*  MG  --  2.3  --   PHOS  --  4.1  --    Liver Function Tests:  Recent Labs Lab 07/16/16 1650 07/17/16 0519  AST 510* 443*  ALT 121* 121*  ALKPHOS 80 70  BILITOT 1.4* 0.8  PROT 6.5 5.9*  ALBUMIN 3.1* 2.7*   CBC:  Recent Labs  Lab 07/16/16 1650 07/17/16 0519 07/18/16 0318  WBC 9.4 8.4 5.5  NEUTROABS 7.8*  --   --   HGB 9.0* 8.7* 9.0*  HCT 26.4* 25.7* 25.8*  MCV 83.4 84.0 82.3  PLT 198 147* 152   Cardiac Enzymes:  Recent Labs Lab 07/16/16 1650 07/17/16 1531 07/17/16 2119 07/18/16 0318  TROPONINI 0.13* 0.04* 0.03* 0.03*    CBG:  Recent Labs Lab 07/16/16 1657 07/16/16 1720 07/16/16 1859  GLUCAP 55* 148* 87    Recent Results (from the past 240 hour(s))  Blood Culture (routine x 2)     Status: None (Preliminary result)   Collection Time: 07/16/16  4:50 PM  Result Value Ref Range Status   Specimen Description BLOOD LEFT ARM  Final   Special Requests BOTTLES DRAWN AEROBIC AND ANAEROBIC  7CC  Final    Culture NO GROWTH 2 DAYS  Final   Report Status PENDING  Incomplete  Blood Culture (routine x 2)     Status: None (Preliminary result)   Collection Time: 07/16/16  4:55 PM  Result Value Ref Range Status   Specimen Description BLOOD LEFT NECK  Final   Special Requests BOTTLES DRAWN AEROBIC AND ANAEROBIC  6CC  Final   Culture NO GROWTH 2 DAYS  Final   Report Status PENDING  Incomplete  Urine culture     Status: None (Preliminary result)   Collection Time: 07/16/16  5:42 PM  Result Value Ref Range Status   Specimen Description URINE, RANDOM  Final   Special Requests NONE  Final   Culture   Final    CULTURE REINCUBATED FOR BETTER GROWTH [facility]    Report Status PENDING  Incomplete  MRSA PCR Screening     Status: None   Collection Time: 07/16/16  7:05 PM  Result Value Ref Range Status   MRSA by PCR NEGATIVE NEGATIVE Final    Comment:        The GeneXpert MRSA Assay (FDA approved for NASAL specimens only), is one component of a comprehensive MRSA colonization surveillance program. It is not intended to diagnose MRSA infection nor to guide or monitor treatment for MRSA infections.      Studies: Ct Head Wo Contrast  Result Date: 07/16/2016 CLINICAL DATA:  Pt is unresponsive while in CT. Per note in pt chart "Pt to ED via EMS from home , per EMS pt was combative, axillary temp 104, BP 158/84, CBG was 43, EMS gave 1mg  glucagon , EMS also gave 5mg  haldol IM en route. History of heroin use. Book shelf fell on patient yesterday. EXAM: CT HEAD WITHOUT CONTRAST TECHNIQUE: Contiguous axial images were obtained from the base of the skull through the vertex without intravenous contrast. COMPARISON:  07/29/2011 FINDINGS: Brain: No evidence of acute infarction, hemorrhage, hydrocephalus, extra-axial collection or mass lesion/mass effect. Vascular: No hyperdense vessel or unexpected calcification. Skull: No acute fracture Sinuses/Orbits: There is significant  mucoperiosteal thickening of all paranasal sinuses. The mastoid air cells are normally aerated. Other: None IMPRESSION: 1.  No evidence for acute intracranial abnormality. 2. Pansinusitis. Electronically Signed   By: Norva PavlovElizabeth  Brown M.D.   On: 07/16/2016 19:04   Dg Chest Port 1 View  Result Date: 07/17/2016 CLINICAL DATA:  Follow-up central line placement, evaluate for pneumonia EXAM: PORTABLE CHEST 1 VIEW COMPARISON:  07/16/2016 FINDINGS: Lungs are clear.  No pleural effusion or pneumothorax. The heart is normal in size. Left IJ venous catheter terminates at the cavoatrial junction. Cervical spine fixation hardware. IMPRESSION:  No evidence of acute cardiopulmonary disease. Left IJ venous catheter terminates at the cavoatrial junction. Electronically Signed   By: Charline Bills M.D.   On: 07/17/2016 07:55   Dg Chest Portable 1 View  Result Date: 07/16/2016 CLINICAL DATA:  Central line placement EXAM: PORTABLE CHEST 1 VIEW COMPARISON:  Portable exam 1717 hours compared to 07/16/2016 FINDINGS: LEFT jugular central venous catheter with tip projecting over SVC. Minimal enlargement of cardiac silhouette with slight pulmonary vascular congestion. Mediastinal contours and pulmonary vascularity normal. Mild persistent accentuation of perihilar markings with central peribronchial thickening. Remaining lungs clear. No pleural effusion or pneumothorax. Prior cervical spine fusion. IMPRESSION: No pneumothorax following LEFT jugular line placement. Minimal bronchitic changes and questionable perihilar infiltrates. Electronically Signed   By: Ulyses Southward M.D.   On: 07/16/2016 17:41   Dg Chest Port 1 View  Result Date: 07/16/2016 CLINICAL DATA:  Poor historian- pt understands commands but she is unable to communicate with [place] nor hold still for image. Pt was just released from rehab from heroin use. Pt has 104.0 temp axillary. Fell and hit head yesterday EXAM: PORTABLE CHEST 1 VIEW COMPARISON:  07/30/2015 FINDINGS:  Heart size is accentuated by the technique. There is question of left perihilar infiltrate warranting further evaluation. There is no pulmonary edema. No pleural effusions. IMPRESSION: Question of left perihilar infiltrate. PA and lateral view of the chest recommended for further characterization. Electronically Signed   By: Norva Pavlov M.D.   On: 07/16/2016 17:03    Scheduled Meds: . doxycycline  100 mg Oral Q12H  . heparin  5,000 Units Subcutaneous Q8H  . mouth rinse  15 mL Mouth Rinse BID  . pneumococcal 23 valent vaccine  0.5 mL Intramuscular Tomorrow-1000  . potassium chloride  40 mEq Oral Once    Assessment/Plan:  1. Patient was admitted with severe sepsis. She was placed on aggressive antibiotics. So far blood cultures are negative. Chest x-ray negative. Cellulitis of the left lower extremity seems improved. Switch aggressive antibiotics over to doxycycline. 2. Acute kidney injury. This has improved with IV fluid hydration. Since the patient is swollen I will stop IV fluid hydration 3. Polysubstance abuse. Looking back at all previous urine drug toxicology as it has been positive numerous times. I advised that the patient must stop this if she wants to survive. 4. Hypokalemia potassium supplementation 5. Anemia of chronic disease 6. Borderline troponin likely demand ischemia from sepsis 7. Heart murmur. Echocardiogram 8. Hypoglycemia on presentation likely with altered mental status 9. Elevated liver enzymes and history of hepatitis C. Recheck liver function test tomorrow along with hepatitis B antibodies.  Code Status:     Code Status Orders        Start     Ordered   07/16/16 1813  Full code  Continuous     07/16/16 1821    Code Status History    Date Active Date Inactive Code Status Order ID Comments User Context   07/16/2016  6:21 PM 07/18/2016 12:57 PM Full Code [id number]  Merwyn Katos, MD ED   07/10/2015 11:02 AM 07/12/2015 11:38 PM Full Code [id number]  Enid Baas, MD Inpatient   05/18/2015  6:25 PM 05/22/2015  2:56 AM Full Code [id number]  Hildred Laser, MD Inpatient   05/17/2015 10:00 PM 05/18/2015  6:25 PM Full Code [id number]  Hildred Laser, MD Inpatient      Disposition Plan: Potentially home tomorrow if afebrile  Antibiotics:  Oral doxycycline  Time spent: 25 minutes  Renae Gloss,  [employer]

## 2016-07-18 NOTE — Progress Notes (Signed)
  Echocardiogram 2D Echocardiogram has been performed.  Samantha SavoyCasey Arias [name] 07/18/2016, 3:23 PM

## 2016-07-19 LAB — COMPREHENSIVE METABOLIC PANEL
ALT: 187 U/L — AB (ref 14–54)
AST: 313 U/L — AB (ref 15–41)
Albumin: 3.3 g/dL — ABNORMAL LOW (ref 3.5–5.0)
Alkaline Phosphatase: 79 U/L (ref 38–126)
Anion gap: 8 (ref 5–15)
BUN: 8 mg/dL (ref 6–20)
CHLORIDE: 97 mmol/L — AB (ref 101–111)
CO2: 26 mmol/L (ref 22–32)
CREATININE: 0.71 mg/dL (ref 0.44–1.00)
Calcium: 8.3 mg/dL — ABNORMAL LOW (ref 8.9–10.3)
GFR calc Af Amer: >60 mL/min (ref >60)
GFR calc non Af Amer: >60 mL/min (ref >60)
Glucose, Bld: 114 mg/dL — ABNORMAL HIGH (ref 65–99)
POTASSIUM: 3.2 mmol/L — AB (ref 3.5–5.1)
SODIUM: 131 mmol/L — AB (ref 135–145)
Total Bilirubin: 0.5 mg/dL (ref 0.3–1.2)
Total Protein: 7.4 g/dL (ref 6.5–8.1)

## 2016-07-19 MED ORDER — POTASSIUM CHLORIDE CRYS ER 20 MEQ PO TBCR
40.0000 meq | EXTENDED_RELEASE_TABLET | Freq: Once | ORAL | Status: AC
  Administered 2016-07-19: 40 meq via ORAL
  Filled 2016-07-19: qty 2

## 2016-07-19 MED ORDER — FUROSEMIDE 10 MG/ML IJ SOLN
20.0000 mg | Freq: Once | INTRAMUSCULAR | Status: AC
  Administered 2016-07-19: 20 mg via INTRAVENOUS
  Filled 2016-07-19: qty 2

## 2016-07-19 MED ORDER — FERROUS SULFATE 325 (65 FE) MG PO TABS: 325.0000 mg | ORAL_TABLET | Freq: Every day | ORAL | 0 refills | Status: DC

## 2016-07-19 MED ORDER — PROMETHAZINE HCL 25 MG/ML IJ SOLN
12.5000 mg | Freq: Once | INTRAMUSCULAR | Status: AC
  Administered 2016-07-19: 12.5 mg via INTRAVENOUS
  Filled 2016-07-19: qty 1

## 2016-07-19 MED ORDER — DOXYCYCLINE HYCLATE 100 MG PO TABS: 100.0000 mg | ORAL_TABLET | Freq: Two times a day (BID) | ORAL | 0 refills | Status: DC

## 2016-07-19 MED ORDER — ONDANSETRON HCL 4 MG/2ML IJ SOLN
4.0000 mg | Freq: Four times a day (QID) | INTRAMUSCULAR | Status: DC | PRN
  Administered 2016-07-19 (×2): 4 mg via INTRAVENOUS
  Filled 2016-07-19 (×2): qty 2

## 2016-07-19 NOTE — Progress Notes (Signed)
Lasix dose given 0737 via central line. Rechecked BP at 0915 was 166/90. MD notified. New orders for patient to be discharged with no BP meds.   Reviewed discharge instructions with patient. [employer] Line removed by Marchelle FolksAmanda, Charity fundraiserN.

## 2016-07-19 NOTE — Discharge Instructions (Signed)
Sepsis, Adult Sepsis is a serious infection of your blood or tissues that affects your whole body. The infection that causes sepsis may be bacterial, viral, fungal, or parasitic. Sepsis may be life threatening. Sepsis can cause your blood pressure to drop. This may result in shock. Shock causes your central nervous system and your organs to stop working correctly.  RISK FACTORS Sepsis can happen in anyone, but it is more likely to happen in people who have weakened immune systems. SIGNS AND SYMPTOMS  Symptoms of sepsis can include:  Fever or low body temperature (hypothermia).  Rapid breathing (hyperventilation).  Chills.  Rapid heartbeat (tachycardia).  Confusion or light-headedness.  Trouble breathing.  Urinating much less than usual.  Cool, clammy skin or red, flushed skin.  Other problems with the heart, kidneys, or brain. DIAGNOSIS  Your health care provider will likely do tests to look for an infection, to see if the infection has spread to your blood, and to see how serious your condition is. Tests can include:  Blood tests, including cultures of your blood.  Cultures of other fluids from your body, such as:  Urine.  Pus from wounds.  Mucus coughed up from your lungs.  Urine tests other than cultures.  X-ray exams or other imaging tests. TREATMENT  Treatment will begin with elimination of the source of infection. If your sepsis is likely caused by a bacterial or fungal infection, you will be given antibiotic or antifungal medicines. You may also receive:  Oxygen.  Fluids through an IV tube.  Medicines to increase your blood pressure.  A machine to clean your blood (dialysis) if your kidneys fail.  A machine to help you breathe if your lungs fail. SEEK IMMEDIATE MEDICAL CARE IF: You get an infection or develop any of the signs and symptoms of sepsis after surgery or a hospitalization.   This information is not intended to replace advice given to you by  your health care provider. Make sure you discuss any questions you have with your health care provider.   Document Released: 07/29/2003 Document Revised: 03/16/2015 Document Reviewed: 07/07/2013 Elsevier Interactive Patient Education 2016 [employer]  

## 2016-07-19 NOTE — Discharge Summary (Signed)
[employer] - [place] [facility]   PATIENT NAME: [name]    MR#:  [id number]  DATE OF BIRTH:  September 30, 1981  DATE OF ADMISSION:  07/16/2016 ADMITTING PHYSICIAN: Dr Sung Amabile  DATE OF DISCHARGE: 07/19/2016 10:30 AM  PRIMARY CARE PHYSICIAN: No PCP Per Patient    ADMISSION DIAGNOSIS:  Endocarditis [I38] Altered mental state [R41.82] Community acquired pneumonia [J18.9] Systolic ejection murmur [R01.1] Severe sepsis (HCC) [A41.9, R65.20]  DISCHARGE DIAGNOSIS:  Active Problems:   AA (alcohol abuse)   HCV (hepatitis C virus)   Severe sepsis (HCC)   AKI (acute kidney injury) (HCC)   Obtundation   Elevated lactic acid level   Drug abuse, IV   Elevated LFTs   SECONDARY DIAGNOSIS:   Past Medical History:  Diagnosis Date  . Acute deep vein thrombosis of arm (HCC) 08/19/2014   Last Assessment & Plan:  - diagnosed 08/2014 in hospital - s/p heparin gtt - patient never took her xarelto x 3 month course that was planned - no symptoms today.  No further treatment - we discussed avoiding IVDU (see below) to reduce her chances of provoked upper extremity DVT   . Asthma   . HCV (hepatitis C virus) 07/17/2013   Last Assessment & Plan:  - positive antibody most recently 08/2014 - hep C RNA 08/2014 was negative - prior to that, HCV RNA [id number] in 06/2013. No record for genotype or imaging.   - patient counseled that she could become re-infected with continued IVDU or unprotected sexual encounters.   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   Last Assessment & Plan:  Discussed routine use of Valtrex prophylaxis at 36 weeks. She will be delivered by cesarean at 36-37 weeks.   . Infection with methicillin-resistant Staphylococcus aureus 03/19/2013   Overview: Arm abscess with MRSA, treated.  Subsequent screening negative  . IV drug abuse   . Tenosynovitis 08/19/2014    HOSPITAL COURSE:   1. Severe sepsis. Patient was placed  on aggressive antibiotics by critical care specialist. Blood cultures remain negative. Chest x-ray negative. Cellulitis of the left lower extremity as documented by critical care specialist. I switched antibiotics over to by mouth doxycycline. Not even sure if this was sepsis or not. 2. Acute kidney injury. Improved with IV fluid hydration. 3. Polysubstance abuse. All previous urine drug toxicology's have been positive numerous times. I advised the patient must stop drug abuse if she wants to survive. 4. Edema lower extremity. Give 1 dose of Lasix. 5. Elevated blood pressure without diagnosis of hypertension. I gave 1 dose of Lasix and blood pressure did come down. 6. Hypokalemia potassium supplementation given during hospital course 7. Anemia of chronic disease 8. Borderline troponin likely demand ischemia from severe sepsis 9. Heart murmur on exam. 10. Hypoglycemia on presentation which could be the cause of her altered mental status 11. Elevated liver enzymes and history of hepatitis C. Hepatitis B profile sent off.  DISCHARGE CONDITIONS:   Satisfactory  CONSULTS OBTAINED:   patient was a transfer from the critical care service  DRUG ALLERGIES:   Allergies  Allergen Reactions  . Amoxicillin Rash  . Latex   . Vancomycin   . Clindamycin/Lincomycin Rash    DISCHARGE MEDICATIONS:   Discharge Medication List as of 07/19/2016  8:03 AM    CONTINUE these medications which have CHANGED   Details  doxycycline (VIBRA-TABS) 100 MG tablet Take 1 tablet (100 mg total) by mouth every  12 (twelve) hours., Starting Wed 07/19/2016, Print    ferrous sulfate 325 (65 FE) MG tablet Take 1 tablet (325 mg total) by mouth daily with breakfast., Starting Wed 07/19/2016, Print      CONTINUE these medications which have NOT CHANGED   Details  ranitidine (ZANTAC) 150 MG tablet Take 1 tablet (150 mg total) by mouth 2 (two) times daily., Starting 07/13/2015, Until Discontinued, Print      STOP taking these  medications     cefpodoxime (VANTIN) 200 MG tablet      cephALEXin (KEFLEX) 500 MG capsule      docusate sodium (COLACE) 100 MG capsule      enoxaparin (LOVENOX) 40 MG/0.4ML injection      ibuprofen (ADVIL,MOTRIN) 800 MG tablet      lidocaine (LIDODERM) 5 %      oxyCODONE-acetaminophen (PERCOCET/ROXICET) 5-325 MG per tablet      sulfamethoxazole-trimethoprim (BACTRIM DS) 800-160 MG tablet          DISCHARGE INSTRUCTIONS:   Follow-up with Dr. on [facility] card  If you experience worsening of your admission symptoms, develop shortness of breath, life threatening emergency, suicidal or homicidal thoughts you must seek medical attention immediately by calling 911 or calling your MD immediately  if symptoms less severe.  You Must read complete instructions/literature along with all the possible adverse reactions/side effects for all the Medicines you take and that have been prescribed to you. Take any new Medicines after you have completely understood and accept all the possible adverse reactions/side effects.   Please note  You were cared for by a hospitalist during your hospital stay. If you have any questions about your discharge medications or the care you received while you were in the hospital after you are discharged, you can call the unit and asked to speak with the hospitalist on call if the hospitalist that took care of you is not available. Once you are discharged, your primary care physician will handle any further medical issues. Please note that NO REFILLS for any discharge medications will be authorized once you are discharged, as it is imperative that you return to your primary care physician (or establish a relationship with a primary care physician if you do not have one) for your aftercare needs so that they can reassess your need for medications and monitor your lab values.    Today   CHIEF COMPLAINT:   Chief Complaint  Patient presents with  . Altered Mental  Status  . Drug Overdose    HISTORY OF PRESENT ILLNESS:  [name]  is a 35 y.o. female came in with altered mental status fever and acute kidney injury   VITAL SIGNS:  Blood pressure (!) 181/114, pulse 67, temperature 98.3 F (36.8 C), temperature source Oral, resp. rate 18, height 5\' 6"  (1.676 m), weight 59.1 kg (130 lb 3.2 oz), SpO2 100 %, not currently breastfeeding. Blood pressure as per the nurse 162/90 upon discharge home.   PHYSICAL EXAMINATION:  GENERAL:  35 y.o.-year-old patient lying in the bed with no acute distress.  EYES: Pupils equal, round, reactive to light and accommodation. No scleral icterus. Extraocular muscles intact.  HEENT: Head atraumatic, normocephalic. Oropharynx and nasopharynx clear.  NECK:  Supple, no jugular venous distention. No thyroid enlargement, no tenderness.  LUNGS: Normal breath sounds bilaterally, no wheezing, rales,rhonchi or crepitation. No use of accessory muscles of respiration.  CARDIOVASCULAR: S1, S2 normal. No murmurs, rubs, or gallops.  ABDOMEN: Soft, non-tender, non-distended. Bowel sounds present. No organomegaly  or mass.  EXTREMITIES: No pedal edema, cyanosis, or clubbing.  NEUROLOGIC: Cranial nerves II through XII are intact. Muscle strength 5/5 in all extremities. Sensation intact. Gait not checked.  PSYCHIATRIC: The patient is alert and oriented x 3.  SKIN: Multiple bruises on legs and arms different stages. Multiple cuts and scrapes arms and legs face and wrist numerous stages.  DATA REVIEW:   CBC  Recent Labs Lab 07/18/16 0318  WBC 5.5  HGB 9.0*  HCT 25.8*  PLT 152    Chemistries   Recent Labs Lab 07/17/16 0519  07/19/16 0527  NA 136  < > 131*  K 3.0*  < > 3.2*  CL 108  < > 97*  CO2 22  < > 26  GLUCOSE 104*  < > 114*  BUN 38*  < > 8  CREATININE 1.83*  < > 0.71  CALCIUM 7.5*  < > 8.3*  MG 2.3  --   --   AST 443*  --  313*  ALT 121*  --  187*  ALKPHOS 70  --  79  BILITOT 0.8  --  0.5  < > = values in  this interval not displayed.  Cardiac Enzymes  Recent Labs Lab 07/18/16 0318  TROPONINI 0.03*    Microbiology Results  Results for orders placed or performed during the hospital encounter of 07/16/16  Blood Culture (routine x 2)     Status: None (Preliminary result)   Collection Time: 07/16/16  4:50 PM  Result Value Ref Range Status   Specimen Description BLOOD LEFT ARM  Final   Special Requests BOTTLES DRAWN AEROBIC AND ANAEROBIC  7CC  Final   Culture NO GROWTH 3 DAYS  Final   Report Status PENDING  Incomplete  Blood Culture (routine x 2)     Status: None (Preliminary result)   Collection Time: 07/16/16  4:55 PM  Result Value Ref Range Status   Specimen Description BLOOD LEFT NECK  Final   Special Requests BOTTLES DRAWN AEROBIC AND ANAEROBIC  6CC  Final   Culture NO GROWTH 3 DAYS  Final   Report Status PENDING  Incomplete  Urine culture     Status: Abnormal   Collection Time: 07/16/16  5:42 PM  Result Value Ref Range Status   Specimen Description URINE, RANDOM  Final   Special Requests NONE  Final   Culture (A)  Final    10,000 COLONIES/mL GROUP B STREP(S.AGALACTIAE)ISOLATED Virtually 100% of S. agalactiae (Group B) strains are susceptible to Penicillin.  For Penicillin-allergic patients, Erythromycin (85-95% sensitive) and Clindamycin (80% sensitive) are drugs of choice. Contact microbiology lab to request sensitivities if  needed within 7 days. Performed [facility]    Report Status 07/18/2016 FINAL  Final  MRSA PCR Screening     Status: None   Collection Time: 07/16/16  7:05 PM  Result Value Ref Range Status   MRSA by PCR NEGATIVE NEGATIVE Final    Comment:        The GeneXpert MRSA Assay (FDA approved for NASAL specimens only), is one component of a comprehensive MRSA colonization surveillance program. It is not intended to diagnose MRSA infection nor to guide or monitor treatment for MRSA infections.       Management plans discussed with the  patient, family and they are in agreement.  CODE STATUS:     Code Status Orders        Start     Ordered   07/16/16 1813  Full code  Continuous     07/16/16 1821    Code Status History    Date Active Date Inactive Code Status Order ID Comments User Context   07/16/2016  6:21 PM 07/18/2016 12:57 PM Full Code [id number]  Merwyn Katos, MD ED   07/10/2015 11:02 AM 07/12/2015 11:38 PM Full Code [id number]  Enid Baas, MD Inpatient   05/18/2015  6:25 PM 05/22/2015  2:56 AM Full Code [id number]  Hildred Laser, MD Inpatient   05/17/2015 10:00 PM 05/18/2015  6:25 PM Full Code [id number]  Hildred Laser, MD Inpatient      TOTAL TIME TAKING CARE OF THIS PATIENT: 35 minutes.    Alford Highland M.D on 07/19/2016 at 1:16 PM  Between 7am to 6pm - Pager - [phone]  After 6pm go to [email or web address] - password [email or web address]  [employer] Office  [phone]  CC: Primary care physician; No PCP Per Patient

## 2016-07-20 LAB — HEPATITIS B SURFACE ANTIGEN: HEP B S AG: NEGATIVE

## 2016-07-20 LAB — HEPATITIS B SURFACE ANTIBODY, QUANTITATIVE: Hepatitis B-Post: >1000 m[IU]/mL (ref >9.9)

## 2016-07-20 LAB — HEPATITIS B CORE ANTIBODY, TOTAL: Hep B Core Total Ab: NEGATIVE

## 2016-07-21 LAB — CULTURE, BLOOD (ROUTINE X 2)
Culture: NO GROWTH
Culture: NO GROWTH

## 2016-07-25 ENCOUNTER — Emergency Department
Admission: EM | Admit: 2016-07-25 | Discharge: 2016-07-25 | Disposition: A | Discharge status: Home / Self Care | Payer: Medicaid Other | Attending: Emergency Medicine | Admitting: Emergency Medicine

## 2016-07-25 ENCOUNTER — Emergency Department: Payer: Medicaid Other

## 2016-07-25 ENCOUNTER — Encounter: Payer: Self-pay | Admitting: Emergency Medicine

## 2016-07-25 DIAGNOSIS — Z79899 Other long term (current) drug therapy: Secondary | ICD-10-CM | POA: Insufficient documentation

## 2016-07-25 DIAGNOSIS — J45909 Unspecified asthma, uncomplicated: Secondary | ICD-10-CM | POA: Insufficient documentation

## 2016-07-25 DIAGNOSIS — X58XXXA Exposure to other specified factors, initial encounter: Secondary | ICD-10-CM | POA: Insufficient documentation

## 2016-07-25 DIAGNOSIS — S92342A Displaced fracture of fourth metatarsal bone, left foot, initial encounter for closed fracture: Secondary | ICD-10-CM | POA: Diagnosis not present

## 2016-07-25 DIAGNOSIS — Y999 Unspecified external cause status: Secondary | ICD-10-CM | POA: Insufficient documentation

## 2016-07-25 DIAGNOSIS — F17210 Nicotine dependence, cigarettes, uncomplicated: Secondary | ICD-10-CM | POA: Diagnosis not present

## 2016-07-25 DIAGNOSIS — Z91040 Latex allergy status: Secondary | ICD-10-CM | POA: Insufficient documentation

## 2016-07-25 DIAGNOSIS — Y929 Unspecified place or not applicable: Secondary | ICD-10-CM | POA: Insufficient documentation

## 2016-07-25 DIAGNOSIS — S99922A Unspecified injury of left foot, initial encounter: Secondary | ICD-10-CM | POA: Diagnosis present

## 2016-07-25 DIAGNOSIS — Y939 Activity, unspecified: Secondary | ICD-10-CM | POA: Diagnosis not present

## 2016-07-25 DIAGNOSIS — S92902A Unspecified fracture of left foot, initial encounter for closed fracture: Secondary | ICD-10-CM

## 2016-07-25 LAB — CBC WITH DIFFERENTIAL/PLATELET
BASOS ABS: 0.1 10*3/uL (ref 0–0.1)
BASOS PCT: 1 %
EOS ABS: 0.1 10*3/uL (ref 0–0.7)
Eosinophils Relative: 2 %
HEMATOCRIT: 28.8 % — AB (ref 35.0–47.0)
HEMOGLOBIN: 9.9 g/dL — AB (ref 12.0–16.0)
Lymphocytes Relative: 34 %
Lymphs Abs: 2.3 10*3/uL (ref 1.0–3.6)
MCH: 29.1 pg (ref 26.0–34.0)
MCHC: 34.3 g/dL (ref 32.0–36.0)
MCV: 85 fL (ref 80.0–100.0)
MONOS PCT: 10 %
Monocytes Absolute: 0.7 10*3/uL (ref 0.2–0.9)
NEUTROS ABS: 3.5 10*3/uL (ref 1.4–6.5)
NEUTROS PCT: 53 %
Platelets: 320 10*3/uL (ref 150–440)
RBC: 3.39 MIL/uL — AB (ref 3.80–5.20)
RDW: 15.8 % — ABNORMAL HIGH (ref 11.5–14.5)
WBC: 6.7 10*3/uL (ref 3.6–11.0)

## 2016-07-25 LAB — TROPONIN I: Troponin I: <0.03 ng/mL (ref <0.03)

## 2016-07-25 LAB — BASIC METABOLIC PANEL
ANION GAP: 9 (ref 5–15)
BUN: 13 mg/dL (ref 6–20)
CHLORIDE: 102 mmol/L (ref 101–111)
CO2: 24 mmol/L (ref 22–32)
CREATININE: 1.2 mg/dL — AB (ref 0.44–1.00)
Calcium: 9.1 mg/dL (ref 8.9–10.3)
GFR calc non Af Amer: 59 mL/min — ABNORMAL LOW (ref >60)
Glucose, Bld: 103 mg/dL — ABNORMAL HIGH (ref 65–99)
POTASSIUM: 3 mmol/L — AB (ref 3.5–5.1)
SODIUM: 135 mmol/L (ref 135–145)

## 2016-07-25 LAB — BRAIN NATRIURETIC PEPTIDE: B Natriuretic Peptide: 88 pg/mL (ref 0.0–100.0)

## 2016-07-25 LAB — POCT PREGNANCY, URINE: PREG TEST UR: NEGATIVE

## 2016-07-25 LAB — LACTIC ACID, PLASMA: LACTIC ACID, VENOUS: 0.5 mmol/L (ref 0.5–1.9)

## 2016-07-25 MED ORDER — DOXYCYCLINE HYCLATE 100 MG PO TABS
100.0000 mg | ORAL_TABLET | Freq: Once | ORAL | Status: AC
  Administered 2016-07-25: 100 mg via ORAL
  Filled 2016-07-25: qty 1

## 2016-07-25 MED ORDER — SODIUM CHLORIDE 0.9 % IV BOLUS (SEPSIS)
1000.0000 mL | Freq: Once | INTRAVENOUS | Status: AC
Start: 2016-07-25 — End: 2016-07-25
  Administered 2016-07-25: 1000 mL via INTRAVENOUS

## 2016-07-25 MED ORDER — DOXYCYCLINE HYCLATE 100 MG PO CAPS: 100.0000 mg | ORAL_CAPSULE | Freq: Two times a day (BID) | ORAL | 0 refills | Status: DC

## 2016-07-25 NOTE — ED Triage Notes (Signed)
Recently treated as an inpatient for sepsis from cellulitis  To left foot.  Patient had not taken antibiotics and infection worsened and was admitted.  Patient discharged from hospital last Thursday.  Today patient arrives to ED with c/o hand and feet swelling.  Patient given 4 days RX of doxycycline to make patient' s prescribed 7 day course a 11 day coarse.  Patient states she has only taken 4 days of doxy because she misplaced the other RX bottle.

## 2016-07-25 NOTE — Discharge Instructions (Signed)
Please seek medical attention for any high fevers, chest pain, shortness of breath, change in behavior, persistent vomiting, bloody stool or any other new or concerning symptoms.  

## 2016-07-25 NOTE — ED Notes (Signed)
Dr. Derrill KayGoodman d/c'd 2nd blood culture due to difficulty obtaining blood draws.

## 2016-07-25 NOTE — ED Notes (Signed)
Patient denies using IV drugs in feet or right hand recently, but states she did "shoot up" in right AC this past week.

## 2016-07-25 NOTE — ED Notes (Signed)
2nd Lactic acid is not needed per policy.

## 2016-07-25 NOTE — ED Provider Notes (Signed)
[facility] Emergency Department Provider Note   ____________________________________________   I have reviewed the triage vital signs and the nursing notes.   HISTORY  Chief Complaint Recurrent Skin Infections   History limited by: Not Limited   HPI [name] is a 35 y.o. female who presents to the emergency department today because of concerns for left foot swelling bilateral hand swelling. Patient was discharged from the hospital 6 days ago after an admission for sepsis. The patient states that since discharge she has taken 4 days of doxycycline. Apparently she was supposed to take 11 days worth but did not have her original 7 day prescription. The patient states that she started noticing swelling in her foot and hands yesterday. She is somewhat concerned that she might of broken a bone in her foot since she has been helping her move and she stepped hard. Patient denies any fevers. No nausea or vomiting.    Past Medical History:  Diagnosis Date  . Acute deep vein thrombosis of arm (HCC) 08/19/2014   Last Assessment & Plan:  - diagnosed 08/2014 in hospital - s/p heparin gtt - patient never took her xarelto x 3 month course that was planned - no symptoms today.  No further treatment - we discussed avoiding IVDU (see below) to reduce her chances of provoked upper extremity DVT   . Asthma   . HCV (hepatitis C virus) 07/17/2013   Last Assessment & Plan:  - positive antibody most recently 08/2014 - hep C RNA 08/2014 was negative - prior to that, HCV RNA [id number] in 06/2013. No record for genotype or imaging.   - patient counseled that she could become re-infected with continued IVDU or unprotected sexual encounters.   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   Last Assessment & Plan:  Discussed routine use of Valtrex prophylaxis at 36 weeks. She will be delivered by cesarean at 36-37 weeks.   . Infection with  methicillin-resistant Staphylococcus aureus 03/19/2013   Overview: Arm abscess with MRSA, treated.  Subsequent screening negative  . IV drug abuse   . Tenosynovitis 08/19/2014    Patient Active Problem List   Diagnosis Date Noted  . Severe sepsis (HCC) 07/16/2016  . AKI (acute kidney injury) (HCC) 07/16/2016  . Obtundation 07/16/2016  . Elevated lactic acid level 07/16/2016  . Drug abuse, IV 07/16/2016  . Elevated LFTs 07/16/2016  . Sepsis (HCC) 07/10/2015  . Oligohydramnios 05/18/2015  . S/P cesarean section 05/18/2015  . Preterm uterine contractions 05/17/2015  . Oligohydramnios antepartum 05/17/2015  . H/O cesarean section complicating pregnancy 05/17/2015  . Preterm contractions 05/17/2015  . AA (alcohol abuse) 12/08/2014  . Drug abuse during pregnancy 12/08/2014  . High risk sexual behavior 12/08/2014  . Tenosynovitis 08/19/2014  . Acute deep vein thrombosis of arm (HCC) 08/19/2014  . HCV (hepatitis C virus) 07/17/2013  . H/O cesarean section 05/18/2013  . Herpes infection in pregnancy 05/18/2013  . Infection with methicillin-resistant Staphylococcus aureus 03/19/2013  . Cannabis abuse 03/18/2013  . Polysubstance abuse 03/18/2013  . Compulsive tobacco user syndrome 03/17/2013    Past Surgical History:  Procedure Laterality Date  . CESAREAN SECTION    . CESAREAN SECTION N/A 05/18/2015   Procedure: CESAREAN SECTION;  Surgeon: Hildred Laser, MD;  Location: [facility] ORS;  Service: Obstetrics;  Laterality: N/A;  . NECK SURGERY     Fusion    Prior to Admission medications   Medication Sig Start  Date End Date Taking? Authorizing Provider  doxycycline (VIBRA-TABS) 100 MG tablet Take 1 tablet (100 mg total) by mouth every 12 (twelve) hours. 07/19/16   Alford Highland, MD  ferrous sulfate 325 (65 FE) MG tablet Take 1 tablet (325 mg total) by mouth daily with breakfast. 07/19/16   Alford Highland, MD  ranitidine (ZANTAC) 150 MG tablet Take 1 tablet (150 mg total) by mouth 2 (two) times  daily. 07/13/15   Jenise V Bacon Menshew, PA-C    Allergies Amoxicillin; Latex; Vancomycin; and Clindamycin/lincomycin  Family History  Problem Relation Age of Onset  . Diabetes Mother   . COPD Mother   . Hypertension Mother   . Lung cancer Mother   . Alcohol abuse Father   . Lung cancer Maternal Grandmother     Social History Social History  Substance Use Topics  . Smoking status: Current Every Day Smoker    Packs/day: 1.00    Years: 7.00    Types: Cigarettes  . Smokeless tobacco: Never Used  . Alcohol use No    Review of Systems  Constitutional: Negative for fever. Cardiovascular: Negative for chest pain. Respiratory: Negative for shortness of breath. Gastrointestinal: Negative for abdominal pain, vomiting and diarrhea. Genitourinary: Negative for dysuria. Musculoskeletal: Negative for back pain. Positive for left foot pain and swelling, bilateral hand swelling. Skin: Negative for rash. Neurological: Negative for headaches, focal weakness or numbness.  10-point ROS otherwise negative.  ____________________________________________   PHYSICAL EXAM:  VITAL SIGNS: ED Triage Vitals [07/25/16 1630]  Enc Vitals Group     BP (!) 170/86     Pulse Rate (!) 106     Resp 18     Temp 98 F (36.7 C)     Temp src      SpO2 100 %     Weight 135 lb (61.2 kg)     Height 5\' 6"  (1.676 m)   Constitutional: Alert and oriented. Well appearing and in no distress. Eyes: Conjunctivae are normal. Normal extraocular movements. ENT   Head: Normocephalic and atraumatic.   Nose: No congestion/rhinnorhea.   Mouth/Throat: Mucous membranes are moist.   Neck: No stridor. Hematological/Lymphatic/Immunilogical: No cervical lymphadenopathy. Cardiovascular: Normal rate, regular rhythm.  Systolic murmur.  Respiratory: Normal respiratory effort without tachypnea nor retractions. Breath sounds are clear and equal bilaterally. No wheezes/rales/rhonchi. Gastrointestinal: Soft  and nontender. No distention.  Genitourinary: Deferred Musculoskeletal: Left foot with moderate swelling. Tender to palpation over midfoot. No discoloration. No warmth. Bilateral hands with mild amount of swelling. No erythema noted. No warmth.  Neurologic:  Normal speech and language. No gross focal neurologic deficits are appreciated.  Skin:  Skin is warm, dry and intact. No rash noted. Psychiatric: Mood and affect are normal. Speech and behavior are normal. Patient exhibits appropriate insight and judgment.  ____________________________________________    LABS (pertinent positives/negatives)  Labs Reviewed  CBC WITH DIFFERENTIAL/PLATELET - Abnormal; Notable for the following:       Result Value   RBC 3.39 (*)    Hemoglobin 9.9 (*)    HCT 28.8 (*)    RDW 15.8 (*)    All other components within normal limits  BASIC METABOLIC PANEL - Abnormal; Notable for the following:    Potassium 3.0 (*)    Glucose, Bld 103 (*)    Creatinine, Ser 1.20 (*)    GFR calc non Af Amer 59 (*)    All other components within normal limits  CULTURE, BLOOD (ROUTINE X 2)  CULTURE, BLOOD (ROUTINE X 2)  LACTIC ACID, PLASMA  BRAIN NATRIURETIC PEPTIDE  TROPONIN I  LACTIC ACID, PLASMA  POCT PREGNANCY, URINE     ____________________________________________   EKG  None  ____________________________________________    RADIOLOGY  Left foot   IMPRESSION:  Fracture at the distal aspect of the fourth metatarsal.   ____________________________________________   PROCEDURES  Procedures  ____________________________________________   INITIAL IMPRESSION / ASSESSMENT AND PLAN / ED COURSE  Pertinent labs & imaging results that were available during my care of the patient were reviewed by me and considered in my medical decision making (see chart for details).  Patient presented to the emergency department today with concerns for swelling primarily in the left foot and bilateral hands. Patient  did have concern for traumatic injury to her left foot. X-ray did show a fracture of the fourth metatarsal. Think this would explain the pain and the swelling patient is experiencing. Patient did not have any erythema or warmth over the areas to suggest continued cellulitis. Patient's blood work without any concerning findings for infection. I doubt return of the patient's cellulitis however will give patient prescription for continued antibiotics since she was unable to finish the course desired after the recent hospitalization. Will place foot in boot. ____________________________________________   FINAL CLINICAL IMPRESSION(S) / ED DIAGNOSES  Final diagnoses:  Foot fracture, left, closed, initial encounter     Note: This dictation was prepared with Dragon dictation. Any transcriptional errors that result from this process are unintentional    Phineas SemenGraydon Cade Dashner, MD 07/25/16 [phone]

## 2016-07-30 LAB — CULTURE, BLOOD (ROUTINE X 2): CULTURE: NO GROWTH

## 2017-04-06 ENCOUNTER — Encounter: Payer: Self-pay | Admitting: Emergency Medicine

## 2017-04-06 ENCOUNTER — Emergency Department
Admission: EM | Admit: 2017-04-06 | Discharge: 2017-04-07 | Disposition: A | Discharge status: Home / Self Care | Payer: Medicaid Other | Attending: Emergency Medicine | Admitting: Emergency Medicine

## 2017-04-06 DIAGNOSIS — E860 Dehydration: Secondary | ICD-10-CM | POA: Insufficient documentation

## 2017-04-06 DIAGNOSIS — F17210 Nicotine dependence, cigarettes, uncomplicated: Secondary | ICD-10-CM | POA: Insufficient documentation

## 2017-04-06 DIAGNOSIS — T401X1A Poisoning by heroin, accidental (unintentional), initial encounter: Secondary | ICD-10-CM | POA: Insufficient documentation

## 2017-04-06 DIAGNOSIS — J45909 Unspecified asthma, uncomplicated: Secondary | ICD-10-CM | POA: Insufficient documentation

## 2017-04-06 LAB — COMPREHENSIVE METABOLIC PANEL
ALBUMIN: 4.4 g/dL (ref 3.5–5.0)
ALT: 40 U/L (ref 14–54)
ANION GAP: 29 — AB (ref 5–15)
AST: 84 U/L — ABNORMAL HIGH (ref 15–41)
Alkaline Phosphatase: 169 U/L — ABNORMAL HIGH (ref 38–126)
BILIRUBIN TOTAL: 0.6 mg/dL (ref 0.3–1.2)
BUN: 21 mg/dL — ABNORMAL HIGH (ref 6–20)
CO2: 10 mmol/L — AB (ref 22–32)
Calcium: 9.7 mg/dL (ref 8.9–10.3)
Chloride: 100 mmol/L — ABNORMAL LOW (ref 101–111)
Creatinine, Ser: 2.15 mg/dL — ABNORMAL HIGH (ref 0.44–1.00)
GFR calc Af Amer: 33 mL/min — ABNORMAL LOW (ref >60)
GFR calc non Af Amer: 29 mL/min — ABNORMAL LOW (ref >60)
GLUCOSE: 166 mg/dL — AB (ref 65–99)
POTASSIUM: 3.5 mmol/L (ref 3.5–5.1)
SODIUM: 139 mmol/L (ref 135–145)
TOTAL PROTEIN: 9 g/dL — AB (ref 6.5–8.1)

## 2017-04-06 LAB — URINE DRUG SCREEN, QUALITATIVE (ARMC ONLY)
AMPHETAMINES, UR SCREEN: NOT DETECTED
BENZODIAZEPINE, UR SCRN: NOT DETECTED
Barbiturates, Ur Screen: NOT DETECTED
Cannabinoid 50 Ng, Ur ~~LOC~~: POSITIVE — AB
Cocaine Metabolite,Ur ~~LOC~~: POSITIVE — AB
MDMA (Ecstasy)Ur Screen: NOT DETECTED
METHADONE SCREEN, URINE: NOT DETECTED
OPIATE, UR SCREEN: POSITIVE — AB
Phencyclidine (PCP) Ur S: NOT DETECTED
Tricyclic, Ur Screen: NOT DETECTED

## 2017-04-06 LAB — CBC WITH DIFFERENTIAL/PLATELET
BASOS PCT: 1 %
Basophils Absolute: 0.1 10*3/uL (ref 0–0.1)
EOS ABS: 0.3 10*3/uL (ref 0–0.7)
Eosinophils Relative: 2 %
HCT: 37.1 % (ref 35.0–47.0)
Hemoglobin: 11.9 g/dL — ABNORMAL LOW (ref 12.0–16.0)
Lymphocytes Relative: 45 %
Lymphs Abs: 6.9 10*3/uL — ABNORMAL HIGH (ref 1.0–3.6)
MCH: 29.4 pg (ref 26.0–34.0)
MCHC: 32.1 g/dL (ref 32.0–36.0)
MCV: 91.8 fL (ref 80.0–100.0)
MONO ABS: 1.1 10*3/uL — AB (ref 0.2–0.9)
MONOS PCT: 7 %
Neutro Abs: 6.7 10*3/uL — ABNORMAL HIGH (ref 1.4–6.5)
Neutrophils Relative %: 45 %
PLATELETS: 366 10*3/uL (ref 150–440)
RBC: 4.04 MIL/uL (ref 3.80–5.20)
RDW: 15.2 % — AB (ref 11.5–14.5)
WBC: 15 10*3/uL — ABNORMAL HIGH (ref 3.6–11.0)

## 2017-04-06 LAB — ETHANOL: Alcohol, Ethyl (B): <5 mg/dL (ref <5)

## 2017-04-06 LAB — URINALYSIS, COMPLETE (UACMP) WITH MICROSCOPIC
BILIRUBIN URINE: NEGATIVE
GLUCOSE, UA: NEGATIVE mg/dL
HGB URINE DIPSTICK: NEGATIVE
Ketones, ur: NEGATIVE mg/dL
LEUKOCYTES UA: NEGATIVE
Nitrite: NEGATIVE
PH: 6 (ref 5.0–8.0)
Protein, ur: 100 mg/dL — AB
SPECIFIC GRAVITY, URINE: 1.012 (ref 1.005–1.030)

## 2017-04-06 LAB — TROPONIN I
TROPONIN I: 0.04 ng/mL — AB (ref <0.03)
Troponin I: 0.04 ng/mL (ref <0.03)

## 2017-04-06 LAB — PREGNANCY, URINE: Preg Test, Ur: NEGATIVE

## 2017-04-06 LAB — BASIC METABOLIC PANEL
Anion gap: 11 (ref 5–15)
BUN: 21 mg/dL — AB (ref 6–20)
CHLORIDE: 103 mmol/L (ref 101–111)
CO2: 22 mmol/L (ref 22–32)
Calcium: 8.6 mg/dL — ABNORMAL LOW (ref 8.9–10.3)
Creatinine, Ser: 1.64 mg/dL — ABNORMAL HIGH (ref 0.44–1.00)
GFR calc Af Amer: 46 mL/min — ABNORMAL LOW (ref >60)
GFR calc non Af Amer: 40 mL/min — ABNORMAL LOW (ref >60)
GLUCOSE: 79 mg/dL (ref 65–99)
POTASSIUM: 3 mmol/L — AB (ref 3.5–5.1)
Sodium: 136 mmol/L (ref 135–145)

## 2017-04-06 MED ORDER — POTASSIUM CHLORIDE CRYS ER 20 MEQ PO TBCR
40.0000 meq | EXTENDED_RELEASE_TABLET | Freq: Once | ORAL | Status: AC
  Administered 2017-04-07: 40 meq via ORAL
  Filled 2017-04-06: qty 2

## 2017-04-06 MED ORDER — ONDANSETRON HCL 4 MG/2ML IJ SOLN
4.0000 mg | Freq: Once | INTRAMUSCULAR | Status: AC
  Administered 2017-04-06: 4 mg via INTRAVENOUS
  Filled 2017-04-06: qty 2

## 2017-04-06 MED ORDER — NALOXONE HCL 2 MG/2ML IJ SOSY
2.0000 mg | PREFILLED_SYRINGE | Freq: Once | INTRAMUSCULAR | Status: AC
  Administered 2017-04-06: 2 mg via INTRAVENOUS

## 2017-04-06 MED ORDER — SODIUM CHLORIDE 0.9 % IV SOLN
Freq: Once | INTRAVENOUS | Status: AC
  Administered 2017-04-06: 22:00:00 via INTRAVENOUS

## 2017-04-06 MED ORDER — SODIUM CHLORIDE 0.9 % IV SOLN
Freq: Once | INTRAVENOUS | Status: AC
  Administered 2017-04-06: 23:00:00 via INTRAVENOUS

## 2017-04-06 MED ORDER — NALOXONE HCL 2 MG/2ML IJ SOSY
PREFILLED_SYRINGE | INTRAMUSCULAR | Status: AC
  Administered 2017-04-06: 2 mg
  Filled 2017-04-06: qty 2

## 2017-04-06 NOTE — ED Notes (Signed)
CPS with pt att; awaiting sister to arrive for custody plan

## 2017-04-06 NOTE — ED Provider Notes (Signed)
[facility] Emergency Department Provider Note       Time seen: ----------------------------------------- 9:29 PM on 04/06/2017 -----------------------------------------  L5 caveat: Review of system history is limited by altered mental status   I have reviewed the triage vital signs and the nursing notes.   HISTORY   Chief Complaint Drug Overdose    HPI [name] is a 36 y.o. female who presents to the ED for suspected overdose. Patient arrives unresponsive being pulled out of a vehicle for possibly using heroin tonight. 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   Past Medical History:  Diagnosis Date  . Acute deep vein thrombosis of arm (HCC) 08/19/2014   Last Assessment & Plan:  - diagnosed 08/2014 in hospital - s/p heparin gtt - patient never took her xarelto x 3 month course that was planned - no symptoms today.  No further treatment - we discussed avoiding IVDU (see below) to reduce her chances of provoked upper extremity DVT   . Asthma   . HCV (hepatitis C virus) 07/17/2013   Last Assessment & Plan:  - positive antibody most recently 08/2014 - hep C RNA 08/2014 was negative - prior to that, HCV RNA [id number] in 06/2013. No record for genotype or imaging.   - patient counseled that she could become re-infected with continued IVDU or unprotected sexual encounters.   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   Last Assessment & Plan:  Discussed routine use of Valtrex prophylaxis at 36 weeks. She will be delivered by cesarean at 36-37 weeks.   . Infection with methicillin-resistant Staphylococcus aureus 03/19/2013   Overview: Arm abscess with MRSA, treated.  Subsequent screening negative  . IV drug abuse   . Tenosynovitis 08/19/2014    Patient Active Problem List   Diagnosis Date Noted  . Severe sepsis (HCC) 07/16/2016  . AKI (acute kidney injury) (HCC) 07/16/2016  . Obtundation  07/16/2016  . Elevated lactic acid level 07/16/2016  . Drug abuse, IV 07/16/2016  . Elevated LFTs 07/16/2016  . Sepsis (HCC) 07/10/2015  . Oligohydramnios 05/18/2015  . S/P cesarean section 05/18/2015  . Preterm uterine contractions 05/17/2015  . Oligohydramnios antepartum 05/17/2015  . H/O cesarean section complicating pregnancy 05/17/2015  . Preterm contractions 05/17/2015  . AA (alcohol abuse) 12/08/2014  . Drug abuse during pregnancy 12/08/2014  . High risk sexual behavior 12/08/2014  . Tenosynovitis 08/19/2014  . Acute deep vein thrombosis of arm (HCC) 08/19/2014  . HCV (hepatitis C virus) 07/17/2013  . H/O cesarean section 05/18/2013  . Herpes infection in pregnancy 05/18/2013  . Infection with methicillin-resistant Staphylococcus aureus 03/19/2013  . Cannabis abuse 03/18/2013  . Polysubstance abuse 03/18/2013  . Compulsive tobacco user syndrome 03/17/2013    Past Surgical History:  Procedure Laterality Date  . CESAREAN SECTION    . CESAREAN SECTION N/A 05/18/2015   Procedure: CESAREAN SECTION;  Surgeon: Hildred Laser, MD;  Location: [facility] ORS;  Service: Obstetrics;  Laterality: N/A;  . NECK SURGERY     Fusion    Allergies Amoxicillin; Latex; Vancomycin; and Clindamycin/lincomycin  Social History Social History  Substance Use Topics  . Smoking status: Current Every Day Smoker    Packs/day: 1.00    Years: 7.00    Types: Cigarettes  . Smokeless tobacco: Never Used  . Alcohol use No    Review of Systems Unknown, reported history of substance use tonight  All systems negative/normal/unremarkable except as  stated in the HPI  ____________________________________________   PHYSICAL EXAM:  VITAL SIGNS: ED Triage Vitals  Enc Vitals Group     BP      Pulse      Resp      Temp      Temp src      SpO2      Weight      Height      Head Circumference      Peak Flow      Pain Score      Pain Loc      Pain Edu?      Excl. in GC?    Constitutional:  Lethargic, responsive to painful stimuli, moderate distress Eyes: Conjunctivae are normal. Normal extraocular movements. ENT   Head: Normocephalic and atraumatic.   Nose: No congestion/rhinnorhea.   Mouth/Throat: Mucous membranes are moist.   Neck: No stridor. Cardiovascular: Normal rate, regular rhythm. No murmurs, rubs, or gallops. Respiratory: Normal respiratory effort without tachypnea nor retractions. Breath sounds are clear and equal bilaterally. No wheezes/rales/rhonchi. Gastrointestinal: Soft and nontender. Normal bowel sounds Musculoskeletal: Nontender with normal range of motion in extremities. Numerous track marks in the extremities Neurologic:  Responsive to painful stimuli only Skin:  Skin is hot, diaphoresis is noted Psychiatric: Responsive to painful stimuli only, occasionally agitated ____________________________________________  EKG: Interpreted by me. Sinus tachycardia with rate 120 bpm, normal PR interval, LVH, long QT  ____________________________________________  ED COURSE:  Pertinent labs & imaging results that were available during my care of the patient were reviewed by me and considered in my medical decision making (see chart for details). Patient presents for likely drug overdose, we will assess with labs and imaging as indicated. Clinical Course as of Apr 07 2311  Fri Apr 06, 2017  2220 Police and DSS are involved because of potential child endangerment. There is a toddler in the car and both parents are intoxicated  [JW]    Clinical Course User Index [JW] Emily FilbertWilliams, Jonathan E, MD   Procedures ____________________________________________   LABS (pertinent positives/negatives)  Labs Reviewed  CBC WITH DIFFERENTIAL/PLATELET - Abnormal; Notable for the following:       Result Value   WBC 15.0 (*)    Hemoglobin 11.9 (*)    RDW 15.2 (*)    Neutro Abs 6.7 (*)    Lymphs Abs 6.9 (*)    Monocytes Absolute 1.1 (*)    All other components  within normal limits  COMPREHENSIVE METABOLIC PANEL - Abnormal; Notable for the following:    Chloride 100 (*)    CO2 10 (*)    Glucose, Bld 166 (*)    BUN 21 (*)    Creatinine, Ser 2.15 (*)    Total Protein 9.0 (*)    AST 84 (*)    Alkaline Phosphatase 169 (*)    GFR calc non Af Amer 29 (*)    GFR calc Af Amer 33 (*)    Anion gap 29 (*)    All other components within normal limits  URINE DRUG SCREEN, QUALITATIVE ([facility] ONLY) - Abnormal; Notable for the following:    Cocaine Metabolite,Ur [place] POSITIVE (*)    Opiate, Ur Screen POSITIVE (*)    Cannabinoid 50 Ng, Ur [place] POSITIVE (*)    All other components within normal limits  URINALYSIS, COMPLETE (UACMP) WITH MICROSCOPIC - Abnormal; Notable for the following:    Color, Urine YELLOW (*)    APPearance HAZY (*)    Protein, ur 100 (*)  Bacteria, UA RARE (*)    Squamous Epithelial / LPF 0-5 (*)    All other components within normal limits  ETHANOL  PREGNANCY, URINE  TROPONIN I  BASIC METABOLIC PANEL  CRITICAL CARE Performed by: Emily Filbert   Total critical care time: 30 minutes  Critical care time was exclusive of separately billable procedures and treating other patients.  Critical care was necessary to treat or prevent imminent or life-threatening deterioration.  Critical care was time spent personally by me on the following activities: development of treatment plan with patient and/or surrogate as well as nursing, discussions with consultants, evaluation of patient's response to treatment, examination of patient, obtaining history from patient or surrogate, ordering and performing treatments and interventions, ordering and review of laboratory studies, ordering and review of radiographic studies, pulse oximetry and re-evaluation of patient's condition.  ____________________________________________  FINAL ASSESSMENT AND PLAN  Overdose, Renal insufficiency  Plan: Patient's labs and imaging were dictated above.  Patient had presented for a likely drug overdose and was taken out of a vehicle largely unresponsive. Patient was given Narcan both by me and by nursing staff with improvement in her mental status.  Patient is in no distress, DSS has been involved here with the child. We are rechecking her creatinine and troponin. Emily Filbert, MD   Note: This note was generated in part or whole with voice recognition software. Voice recognition is usually quite accurate but there are transcription errors that can and very often do occur. I apologize for any typographical errors that were not detected and corrected.     Emily Filbert, MD 04/06/17 [phone]

## 2017-04-06 NOTE — ED Notes (Signed)
Call made to communications for DSS for patients child.

## 2017-04-06 NOTE — ED Notes (Signed)
CRITICAL LAB: TROPONIN is 0.04 , Robin, Lab, Dr. Mayford KnifeWilliams notified, orders received

## 2017-04-06 NOTE — ED Triage Notes (Signed)
Per charge, Erie NoeVanessa, RN, pt was combative and removed from vehicle, reports of cocaine and heroin use in addition to "two bootleggers", with man pt denies knowing, man that was driving has child of pt, per man reports of possible drug use "I don't really know man maybe she [place] just went in there to get the baby some juice and came back to her acting crazy."

## 2017-04-06 NOTE — ED Notes (Signed)
Lab called for urine preg

## 2017-04-06 NOTE — ED Notes (Addendum)
Erie NoeVanessa RN called CPS, report att, per same Pt in handcuffs by [employer] for aggression and combativeness before out of car

## 2017-04-07 NOTE — ED Notes (Signed)
DSS, Lowell GuitarPowell to ED for assessment and statement of care of RM24 Felipe Hollerbach's child, Arna Mediciresley Marie Myre (DOB 05/18/15.)

## 2017-04-07 NOTE — ED Provider Notes (Signed)
-----------------------------------------   12:03 AM on 04/07/2017 -----------------------------------------   Blood pressure (!) 151/89, pulse 97, temperature 98.7 F (37.1 C), temperature source Oral, resp. rate 18, height 5\' 6"  (1.676 m), weight 65.9 kg (145 lb 4.5 oz), SpO2 97 %, not currently breastfeeding.  Assuming care from Dr. Mayford KnifeWilliams.  In short, [name] is a 36 y.o. female with a chief complaint of Drug Overdose .  Refer to the original H&P for additional details.  The current plan of care is to follow up the repeat blood work.  The patient's repeat blood work is improved from previous. The patient's troponin is also unremarkable. The patient will be discharged as she does not want to stay in the hospital.   Clinical Course as of Apr 07 2  Fri Apr 06, 2017  2220 Police and DSS are involved because of potential child endangerment. There is a toddler in the car and both parents are intoxicated  [JW]    Clinical Course User Index [JW] Emily FilbertWilliams, Jonathan E, MD      Rebecka ApleyWebster, Allison P, MD 04/07/17 [phone]

## 2017-04-11 NOTE — Progress Notes (Signed)
Clinical Child psychotherapistocial Worker (CSW) received a call from [employer] [place] [place] on [place]. Per CPS worker there is an open CPS case on patient and her child. CPS worker requested clinicals to be faxed to CPS. CSW faxed requested information to CPS.   [employer]iley Tanee Henery, LCSW [phone]

## 2017-07-18 IMAGING — DX DG FOOT COMPLETE 3+V*L*
3 series · 3 of 3 positions shown · non-contrast
Comparison: None.

CLINICAL DATA: The patient is recently treated in hospital for
sepsis and cellulitis.

EXAM:
LEFT FOOT - COMPLETE 3+ VIEW

[foot ap]
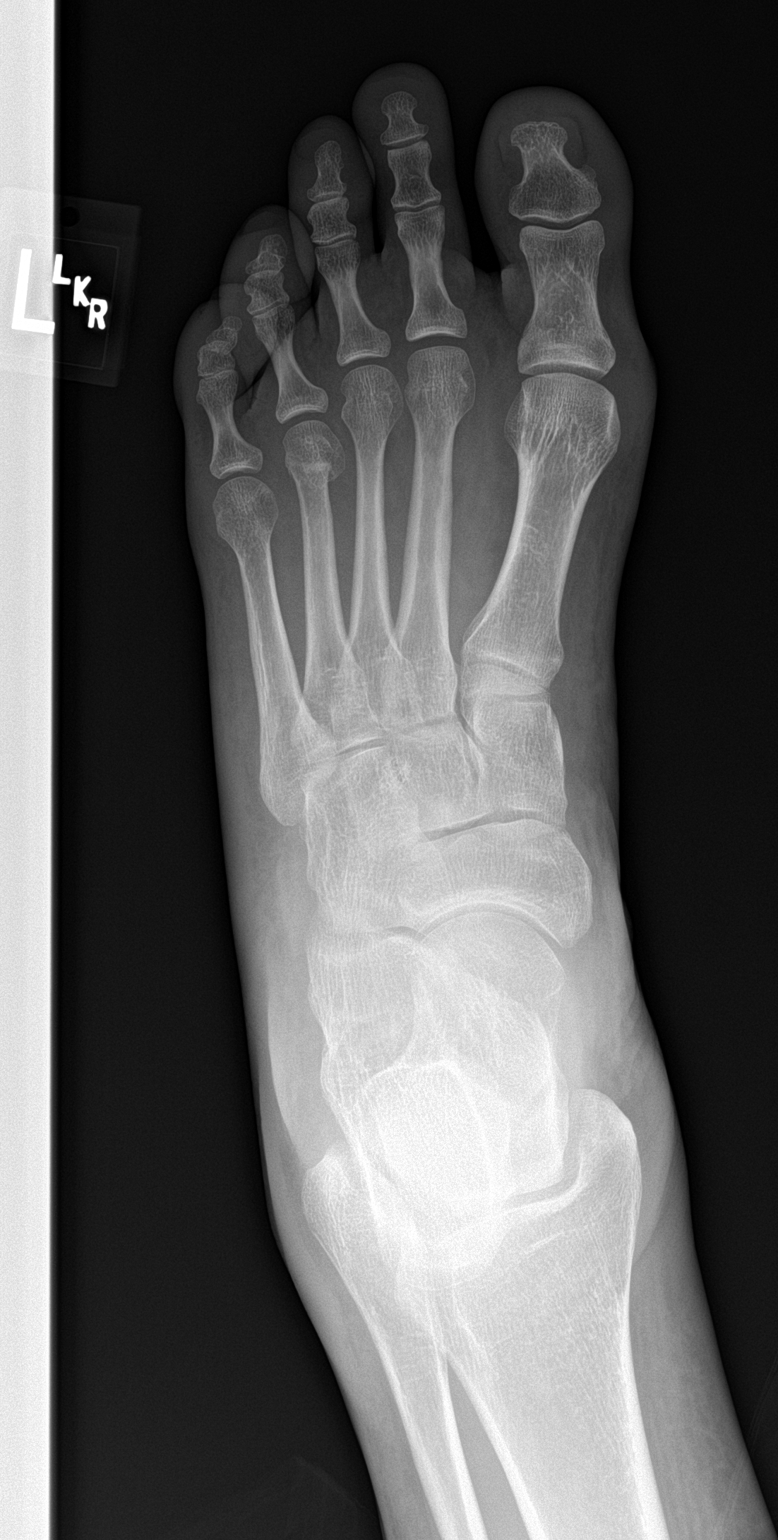

[foot obl]
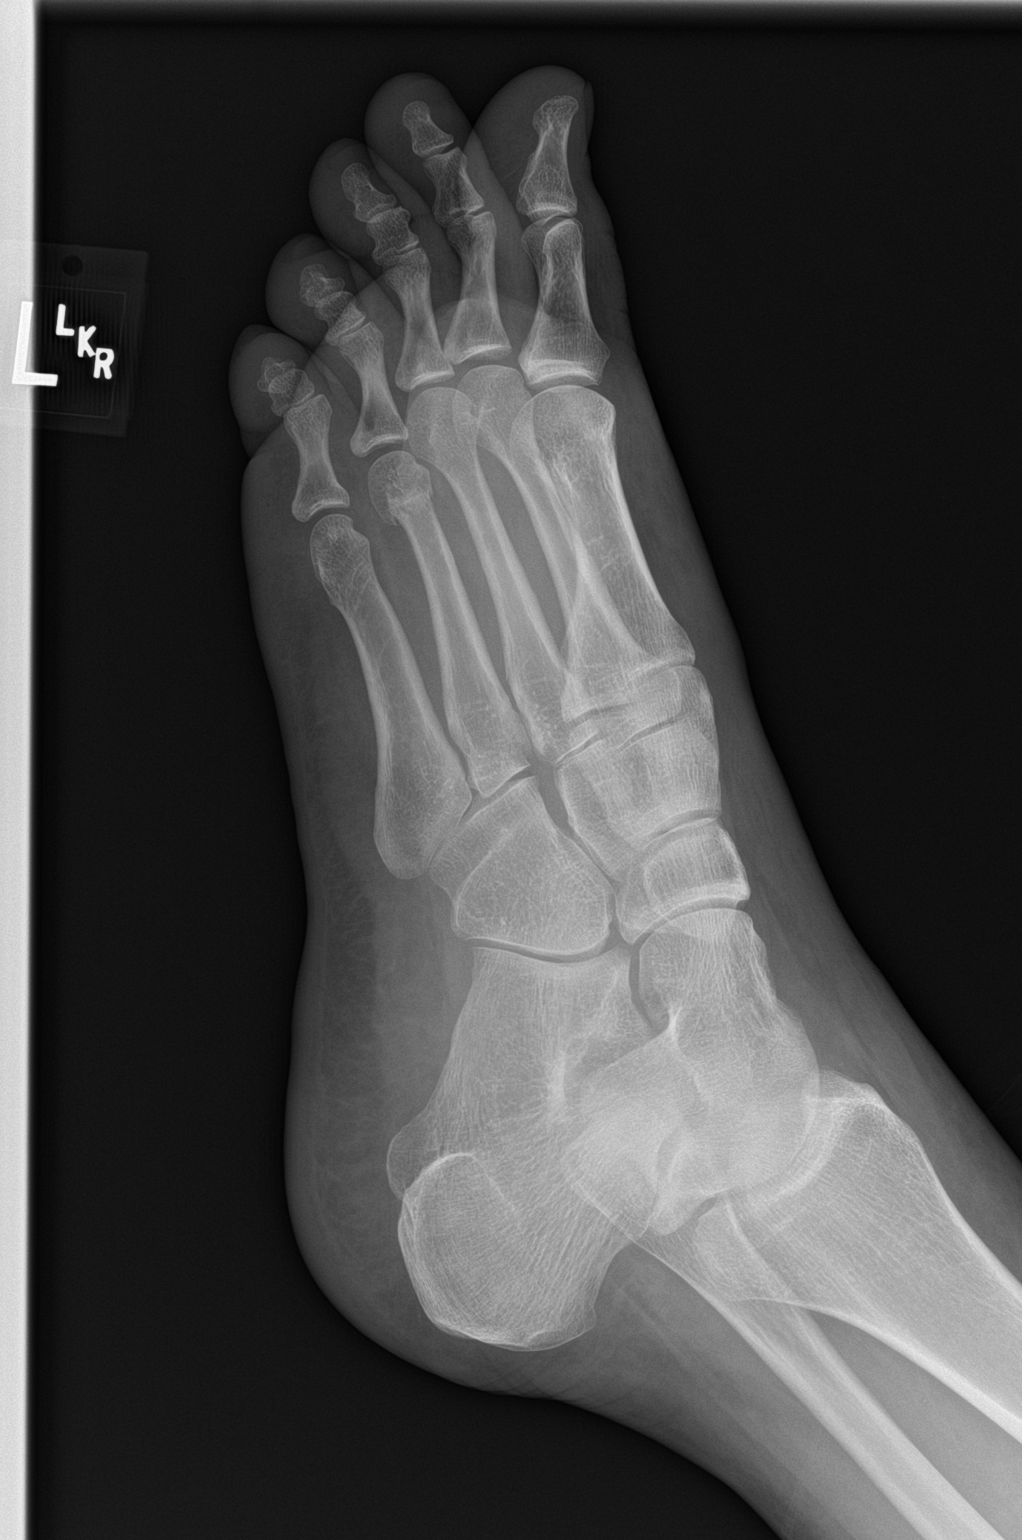

[foot lat]
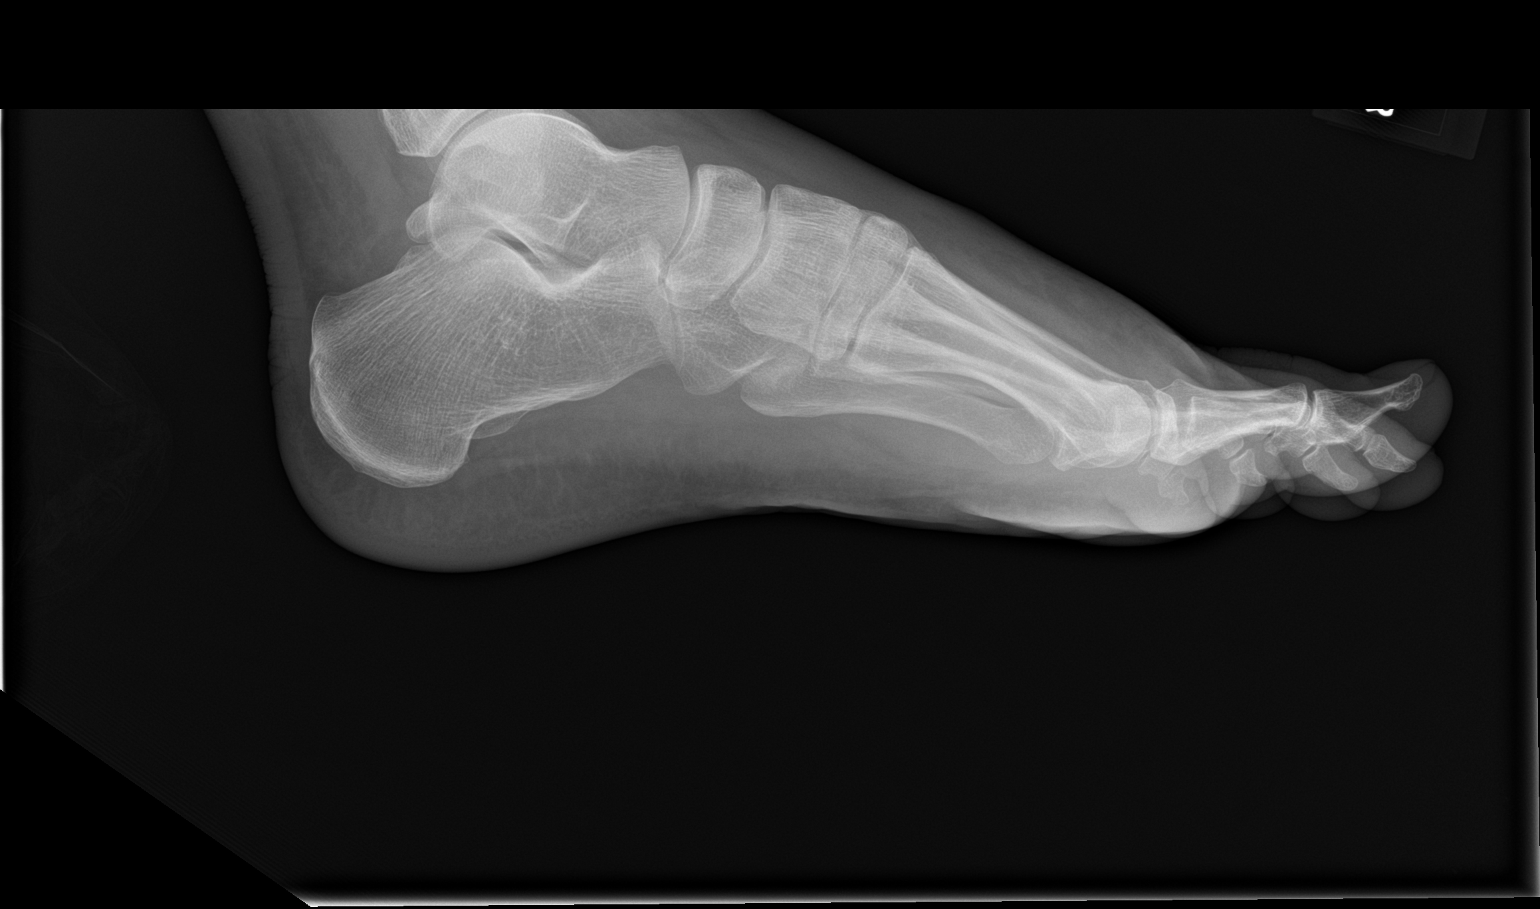

[3 of 3 positions shown; findings below may reference images not displayed]

FINDINGS: There is fracture of the distal aspect of the fourth metatarsal.
There is no dislocation. There is no evidence of osteomyelitis.
IMPRESSION: Fracture at the distal aspect of the fourth metatarsal.

## 2017-07-24 ENCOUNTER — Inpatient Hospital Stay
Admission: EM | Admit: 2017-07-24 | Discharge: 2017-07-28 | DRG: 896 | Disposition: A | Discharge status: Home / Self Care | Payer: Self-pay | Attending: Internal Medicine | Admitting: Internal Medicine

## 2017-07-24 DIAGNOSIS — R410 Disorientation, unspecified: Secondary | ICD-10-CM

## 2017-07-24 DIAGNOSIS — R4182 Altered mental status, unspecified: Secondary | ICD-10-CM | POA: Diagnosis present

## 2017-07-24 DIAGNOSIS — E871 Hypo-osmolality and hyponatremia: Secondary | ICD-10-CM | POA: Diagnosis present

## 2017-07-24 DIAGNOSIS — E860 Dehydration: Secondary | ICD-10-CM | POA: Diagnosis present

## 2017-07-24 DIAGNOSIS — R339 Retention of urine, unspecified: Secondary | ICD-10-CM | POA: Diagnosis present

## 2017-07-24 DIAGNOSIS — Z8249 Family history of ischemic heart disease and other diseases of the circulatory system: Secondary | ICD-10-CM

## 2017-07-24 DIAGNOSIS — F1510 Other stimulant abuse, uncomplicated: Secondary | ICD-10-CM

## 2017-07-24 DIAGNOSIS — Z811 Family history of alcohol abuse and dependence: Secondary | ICD-10-CM

## 2017-07-24 DIAGNOSIS — X58XXXA Exposure to other specified factors, initial encounter: Secondary | ICD-10-CM | POA: Diagnosis present

## 2017-07-24 DIAGNOSIS — F1410 Cocaine abuse, uncomplicated: Secondary | ICD-10-CM | POA: Diagnosis present

## 2017-07-24 DIAGNOSIS — F19921 Other psychoactive substance use, unspecified with intoxication with delirium: Secondary | ICD-10-CM

## 2017-07-24 DIAGNOSIS — Z23 Encounter for immunization: Secondary | ICD-10-CM

## 2017-07-24 DIAGNOSIS — F1123 Opioid dependence with withdrawal: Principal | ICD-10-CM | POA: Diagnosis present

## 2017-07-24 DIAGNOSIS — Z86718 Personal history of other venous thrombosis and embolism: Secondary | ICD-10-CM

## 2017-07-24 DIAGNOSIS — S0083XA Contusion of other part of head, initial encounter: Secondary | ICD-10-CM | POA: Diagnosis present

## 2017-07-24 DIAGNOSIS — N170 Acute kidney failure with tubular necrosis: Secondary | ICD-10-CM | POA: Diagnosis present

## 2017-07-24 DIAGNOSIS — M6282 Rhabdomyolysis: Secondary | ICD-10-CM | POA: Diagnosis present

## 2017-07-24 DIAGNOSIS — N189 Chronic kidney disease, unspecified: Secondary | ICD-10-CM | POA: Diagnosis present

## 2017-07-24 DIAGNOSIS — Z833 Family history of diabetes mellitus: Secondary | ICD-10-CM

## 2017-07-24 DIAGNOSIS — F1193 Opioid use, unspecified with withdrawal: Secondary | ICD-10-CM

## 2017-07-24 DIAGNOSIS — I129 Hypertensive chronic kidney disease with stage 1 through stage 4 chronic kidney disease, or unspecified chronic kidney disease: Secondary | ICD-10-CM | POA: Diagnosis present

## 2017-07-24 DIAGNOSIS — N179 Acute kidney failure, unspecified: Secondary | ICD-10-CM

## 2017-07-24 DIAGNOSIS — T50905A Adverse effect of unspecified drugs, medicaments and biological substances, initial encounter: Secondary | ICD-10-CM

## 2017-07-24 DIAGNOSIS — R778 Other specified abnormalities of plasma proteins: Secondary | ICD-10-CM

## 2017-07-24 DIAGNOSIS — D72829 Elevated white blood cell count, unspecified: Secondary | ICD-10-CM

## 2017-07-24 DIAGNOSIS — J45909 Unspecified asthma, uncomplicated: Secondary | ICD-10-CM | POA: Diagnosis present

## 2017-07-24 DIAGNOSIS — R809 Proteinuria, unspecified: Secondary | ICD-10-CM | POA: Diagnosis present

## 2017-07-24 DIAGNOSIS — J9690 Respiratory failure, unspecified, unspecified whether with hypoxia or hypercapnia: Secondary | ICD-10-CM

## 2017-07-24 DIAGNOSIS — F17210 Nicotine dependence, cigarettes, uncomplicated: Secondary | ICD-10-CM | POA: Diagnosis present

## 2017-07-24 DIAGNOSIS — F1210 Cannabis abuse, uncomplicated: Secondary | ICD-10-CM | POA: Diagnosis present

## 2017-07-24 DIAGNOSIS — B1920 Unspecified viral hepatitis C without hepatic coma: Secondary | ICD-10-CM | POA: Diagnosis present

## 2017-07-24 DIAGNOSIS — G92 Toxic encephalopathy: Secondary | ICD-10-CM | POA: Diagnosis present

## 2017-07-24 DIAGNOSIS — R7989 Other specified abnormal findings of blood chemistry: Secondary | ICD-10-CM

## 2017-07-24 DIAGNOSIS — F1910 Other psychoactive substance abuse, uncomplicated: Secondary | ICD-10-CM

## 2017-07-24 DIAGNOSIS — F1110 Opioid abuse, uncomplicated: Secondary | ICD-10-CM

## 2017-07-24 LAB — CBC WITH DIFFERENTIAL/PLATELET
BASOS ABS: 0 10*3/uL (ref 0–0.1)
BLASTS: 0 %
Band Neutrophils: 5 %
Basophils Relative: 0 %
EOS ABS: 0 10*3/uL (ref 0–0.7)
Eosinophils Relative: 0 %
HCT: 35.2 % (ref 35.0–47.0)
Hemoglobin: 11.9 g/dL — ABNORMAL LOW (ref 12.0–16.0)
LYMPHS ABS: 2.3 10*3/uL (ref 1.0–3.6)
Lymphocytes Relative: 7 %
MCH: 29.9 pg (ref 26.0–34.0)
MCHC: 33.8 g/dL (ref 32.0–36.0)
MCV: 88.4 fL (ref 80.0–100.0)
METAMYELOCYTES PCT: 0 %
MYELOCYTES: 0 %
Monocytes Absolute: 1.3 10*3/uL — ABNORMAL HIGH (ref 0.2–0.9)
Monocytes Relative: 4 %
NEUTROS ABS: 29.2 10*3/uL — AB (ref 1.4–6.5)
NEUTROS PCT: 84 %
Other: 0 %
PLATELETS: 340 10*3/uL (ref 150–440)
PROMYELOCYTES ABS: 0 %
RBC: 3.98 MIL/uL (ref 3.80–5.20)
RDW: 15.7 % — AB (ref 11.5–14.5)
WBC: 32.8 10*3/uL — AB (ref 3.6–11.0)
nRBC: 0 /100 WBC

## 2017-07-24 LAB — MRSA PCR SCREENING: MRSA BY PCR: NEGATIVE

## 2017-07-24 LAB — URINE DRUG SCREEN, QUALITATIVE (ARMC ONLY)
AMPHETAMINES, UR SCREEN: POSITIVE — AB
Barbiturates, Ur Screen: NOT DETECTED
Benzodiazepine, Ur Scrn: NOT DETECTED
Cannabinoid 50 Ng, Ur ~~LOC~~: POSITIVE — AB
Cocaine Metabolite,Ur ~~LOC~~: POSITIVE — AB
MDMA (ECSTASY) UR SCREEN: NOT DETECTED
METHADONE SCREEN, URINE: NOT DETECTED
Opiate, Ur Screen: POSITIVE — AB
Phencyclidine (PCP) Ur S: NOT DETECTED
TRICYCLIC, UR SCREEN: NOT DETECTED

## 2017-07-24 LAB — COMPREHENSIVE METABOLIC PANEL
ALT: 161 U/L — ABNORMAL HIGH (ref 14–54)
AST: 447 U/L — AB (ref 15–41)
Albumin: 4.3 g/dL (ref 3.5–5.0)
Alkaline Phosphatase: 104 U/L (ref 38–126)
Anion gap: 15 (ref 5–15)
BUN: 67 mg/dL — AB (ref 6–20)
CHLORIDE: 99 mmol/L — AB (ref 101–111)
CO2: 16 mmol/L — ABNORMAL LOW (ref 22–32)
Calcium: 8.2 mg/dL — ABNORMAL LOW (ref 8.9–10.3)
Creatinine, Ser: 3.8 mg/dL — ABNORMAL HIGH (ref 0.44–1.00)
GFR, EST AFRICAN AMERICAN: 17 mL/min — AB (ref >60)
GFR, EST NON AFRICAN AMERICAN: 14 mL/min — AB (ref >60)
Glucose, Bld: 104 mg/dL — ABNORMAL HIGH (ref 65–99)
POTASSIUM: 4.6 mmol/L (ref 3.5–5.1)
Sodium: 130 mmol/L — ABNORMAL LOW (ref 135–145)
Total Bilirubin: 1.3 mg/dL — ABNORMAL HIGH (ref 0.3–1.2)
Total Protein: 8.5 g/dL — ABNORMAL HIGH (ref 6.5–8.1)

## 2017-07-24 LAB — GLUCOSE, CAPILLARY: GLUCOSE-CAPILLARY: 125 mg/dL — AB (ref 65–99)

## 2017-07-24 LAB — PROTIME-INR
INR: 1.39
PROTHROMBIN TIME: 16.9 s — AB (ref 11.4–15.2)

## 2017-07-24 LAB — TROPONIN I: TROPONIN I: 0.59 ng/mL — AB (ref <0.03)

## 2017-07-24 LAB — SALICYLATE LEVEL

## 2017-07-24 LAB — ACETAMINOPHEN LEVEL

## 2017-07-24 LAB — FIBRINOGEN: Fibrinogen: 307 mg/dL (ref 210–475)

## 2017-07-24 LAB — ETHANOL

## 2017-07-24 MED ORDER — LORAZEPAM 2 MG/ML IJ SOLN
2.0000 mg | Freq: Once | INTRAMUSCULAR | Status: DC
  Filled 2017-07-24: qty 1

## 2017-07-24 MED ORDER — DOCUSATE SODIUM 100 MG PO CAPS: 100.0000 mg | ORAL_CAPSULE | Freq: Two times a day (BID) | ORAL | Status: DC | PRN

## 2017-07-24 MED ORDER — LORAZEPAM 2 MG/ML IJ SOLN
2.0000 mg | Freq: Once | INTRAMUSCULAR | Status: AC
  Administered 2017-07-24: 2 mg via INTRAMUSCULAR

## 2017-07-24 MED ORDER — DIAZEPAM 5 MG PO TABS
5.0000 mg | ORAL_TABLET | Freq: Once | ORAL | Status: AC
Start: 2017-07-24 — End: 2017-07-24
  Administered 2017-07-24: 5 mg via ORAL
  Filled 2017-07-24: qty 1

## 2017-07-24 MED ORDER — HALOPERIDOL LACTATE 5 MG/ML IJ SOLN
INTRAMUSCULAR | Status: AC
  Administered 2017-07-24: 5 mg via INTRAMUSCULAR
  Filled 2017-07-24: qty 1

## 2017-07-24 MED ORDER — PROMETHAZINE HCL 25 MG PO TABS: 50.0000 mg | ORAL_TABLET | Freq: Once | ORAL | Status: DC

## 2017-07-24 MED ORDER — SODIUM CHLORIDE 0.9 % IV SOLN
INTRAVENOUS | Status: DC
  Administered 2017-07-24 – 2017-07-25 (×2): via INTRAVENOUS

## 2017-07-24 MED ORDER — SODIUM CHLORIDE 0.9 % IV SOLN
310.0000 mg | INTRAVENOUS | Status: DC
  Filled 2017-07-24: qty 6.2

## 2017-07-24 MED ORDER — SODIUM CHLORIDE 0.9 % IV SOLN
Freq: Once | INTRAVENOUS | Status: AC
  Administered 2017-07-27: 22:00:00 via INTRAVENOUS

## 2017-07-24 MED ORDER — HYDRALAZINE HCL 20 MG/ML IJ SOLN
10.0000 mg | INTRAMUSCULAR | Status: DC | PRN
  Administered 2017-07-24: 10 mg via INTRAVENOUS
  Administered 2017-07-25 (×2): 20 mg via INTRAVENOUS
  Filled 2017-07-24 (×3): qty 1

## 2017-07-24 MED ORDER — DEXMEDETOMIDINE HCL IN NACL 400 MCG/100ML IV SOLN
0.4000 ug/kg/h | INTRAVENOUS | Status: DC
  Filled 2017-07-24: qty 100

## 2017-07-24 MED ORDER — SODIUM CHLORIDE 0.9 % IV SOLN
310.0000 mg | INTRAVENOUS | Status: AC
  Administered 2017-07-24: 310 mg via INTRAVENOUS
  Filled 2017-07-24: qty 6.2

## 2017-07-24 MED ORDER — PROMETHAZINE HCL 25 MG PO TABS
25.0000 mg | ORAL_TABLET | Freq: Once | ORAL | Status: AC
  Administered 2017-07-24: 25 mg via ORAL
  Filled 2017-07-24: qty 1

## 2017-07-24 MED ORDER — HALOPERIDOL LACTATE 5 MG/ML IJ SOLN
5.0000 mg | Freq: Once | INTRAMUSCULAR | Status: AC
  Administered 2017-07-24: 5 mg via INTRAMUSCULAR

## 2017-07-24 MED ORDER — HEPARIN SODIUM (PORCINE) 5000 UNIT/ML IJ SOLN
5000.0000 [IU] | Freq: Three times a day (TID) | INTRAMUSCULAR | Status: DC
  Administered 2017-07-24 – 2017-07-27 (×8): 5000 [IU] via SUBCUTANEOUS
  Filled 2017-07-24 (×10): qty 1

## 2017-07-24 NOTE — Progress Notes (Signed)
eLink Physician-Brief Progress Note Patient Name: Nuala Alphaiffany M Goethe DOB: 11/13/1981 MRN: [id number]   Date of Service  07/24/2017  HPI/Events of Note  36 yo with h/o IVDA admitted for severe agitation, active drug abuse with h/o endocarditis, PCM asked to manage  eICU Interventions  Precedex,valium, ativan are all options to use Step down status     Intervention Category Evaluation Type: New Patient Evaluation  Tazia Illescas 07/24/2017, 5:27 PM

## 2017-07-24 NOTE — ED Notes (Signed)
Multiple unsuccessful IV attempts by ED nurses, EDP, and IV team. Patient given IM medications in an attempt to calm her enough for successful attempt. Will continue to monitor patient for more relaxed behavior.

## 2017-07-24 NOTE — ED Notes (Signed)

## 2017-07-24 NOTE — Consult Note (Signed)
[facility] [place] Pulmonary, Critical Care Medicine Consultation      Assessment   36 yo female with history of drug abuse now presents with diffuse bruising, altered mental status suspected alcohol withdrawal.   Acute encephalopathy with acute delirium, likely secondary to opioid withdrawal and substance abuse.  Chronic drug abuse.  AKI  Plan: Check blood cultures.  Continue abx-- ID consulted.  Check echocardiogram.  Continue IVF. Monitor I&O and renal function.  Psych consult when patient more awake. Currently IVC.     Date: 07/24/2017  MRN# [id number] [name] 08/15/1981  Referring Physician: Dr. Elisabeth PigeonVachhani for severe agitation.   [name] is a 36 y.o. old female seen in consultation for chief complaint of:    Chief Complaint  Patient presents with  . Addiction Problem  . Psychiatric Evaluation    HPI:   The patient is currently incoherent with delirium, therefore all history was obtained from chart. The patient is a 36 yo female with a history of known drug abuse of heroine, drug overdose. She was brought in by EMS after someone called for a wellness check. They found her with severe bruising of her forehead and multiple bruising on throughout her body presumably from injection drug abuse.  She consented to come to the hospital and was initially coherent. Subsequently her Utox was positive for cocaine, cannabinoids, opiates. She progressively because more and more delerious and incoherent. She underwent IVC, she was given several doses of sedative medication and is now somnolent she is being admitted to the SDU for precedex drip for agitation.     PMHX:   Past Medical History:  Diagnosis Date  . Acute deep vein thrombosis of arm (HCC) 08/19/2014   Last Assessment & Plan:  - diagnosed 08/2014 in hospital - s/p heparin gtt - patient never took her xarelto x 3 month course that was planned - no symptoms today.  No further treatment - we discussed avoiding IVDU  (see below) to reduce her chances of provoked upper extremity DVT   . Asthma   . HCV (hepatitis C virus) 07/17/2013   Last Assessment & Plan:  - positive antibody most recently 08/2014 - hep C RNA 08/2014 was negative - prior to that, HCV RNA [id number] in 06/2013. No record for genotype or imaging.   - patient counseled that she could become re-infected with continued IVDU or unprotected sexual encounters.   Marland Kitchen. 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   Last Assessment & Plan:  Discussed routine use of Valtrex prophylaxis at 36 weeks. She will be delivered by cesarean at 36-37 weeks.   . Infection with methicillin-resistant Staphylococcus aureus 03/19/2013   Overview: Arm abscess with MRSA, treated.  Subsequent screening negative  . IV drug abuse   . Tenosynovitis 08/19/2014   Surgical Hx:  Past Surgical History:  Procedure Laterality Date  . CESAREAN SECTION    . CESAREAN SECTION N/A 05/18/2015   Procedure: CESAREAN SECTION;  Surgeon: Hildred LaserAnika Cherry, MD;  Location: [facility] ORS;  Service: Obstetrics;  Laterality: N/A;  . NECK SURGERY     Fusion   Family Hx:  Family History  Problem Relation Age of Onset  . Diabetes Mother   . COPD Mother   . Hypertension Mother   . Lung cancer Mother   . Alcohol abuse Father   . Lung cancer Maternal Grandmother    Social Hx:   Social History  Substance Use Topics  . Smoking  status: Current Every Day Smoker    Packs/day: 1.00    Years: 7.00    Types: Cigarettes  . Smokeless tobacco: Never Used  . Alcohol use 0.0 oz/week     Comment: bootlegger x2   Medication:    Current Facility-Administered Medications:  .  0.9 %  sodium chloride infusion, , Intravenous, Once, Emily Filbert, MD .  0.9 %  sodium chloride infusion, , Intravenous, Continuous, Altamese Dilling, MD .  DAPTOmycin (CUBICIN) 310 mg in sodium chloride 0.9 % IVPB, 310 mg, Intravenous, Q48H, Emily Filbert, MD .   dexmedetomidine (PRECEDEX) 400 MCG/100ML (4 mcg/mL) infusion, 0.4-1.2 mcg/kg/hr, Intravenous, Titrated, Altamese Dilling, MD .  docusate sodium (COLACE) capsule 100 mg, 100 mg, Oral, BID PRN, Altamese Dilling, MD .  heparin injection 5,000 Units, 5,000 Units, Subcutaneous, Q8H, Altamese Dilling, MD .  LORazepam (ATIVAN) injection 2 mg, 2 mg, Intravenous, Once, Altamese Dilling, MD No current outpatient prescriptions on file.   Allergies:  Amoxicillin; Latex; Vancomycin; and Clindamycin/lincomycin  Review of Systems: Gen:  Denies  fever, sweats, chills HEENT: Denies blurred vision, double vision. bleeds, sore throat Cvc:  No dizziness, chest pain. Resp:   Denies cough or sputum production, shortness of breath Gi: Denies swallowing difficulty, stomach pain. Gu:  Denies bladder incontinence, burning urine Ext:   No Joint pain, stiffness. Skin: No skin rash,  hives  Endoc:  No polyuria, polydipsia. Psych: No depression, insomnia. Other:  All other systems were reviewed with the patient and were negative other that what is mentioned in the HPI.   Physical Examination:   VS: BP (!) 147/70   Temp 98.8 F (37.1 C) (Oral)   Resp 16   Ht  (1.6 m)   Wt 115 lb (52.2 kg)   SpO2 98%   BMI 20.37 kg/m   General Appearance: No distress, lethargic. Numerous bruises on forehead and throughout body.  Neuro:without focal findings,  speech normal,  HEENT: PERRLA, EOM intact.   Pulmonary: normal breath sounds, No wheezing.  CardiovascularNormal S1,S2.  No m/r/g.   Abdomen: Benign, Soft, non-tender. Renal:  No costovertebral tenderness  GU:  No performed at this time. Endoc: No evident thyromegaly, no signs of acromegaly. Skin:   warm, no rashes, no ecchymosis  Extremities: normal, no cyanosis, clubbing.  Other findings:    LABORATORY PANEL:   CBC  Recent Labs Lab 07/24/17 1331  WBC 32.8*  HGB 11.9*  HCT 35.2  PLT 340    ------------------------------------------------------------------------------------------------------------------  Chemistries   Recent Labs Lab 07/24/17 1331  NA 130*  K 4.6  CL 99*  CO2 16*  GLUCOSE 104*  BUN 67*  CREATININE 3.80*  CALCIUM 8.2*  AST 447*  ALT 161*  ALKPHOS 104  BILITOT 1.3*   ------------------------------------------------------------------------------------------------------------------  Cardiac Enzymes  Recent Labs Lab 07/24/17 1331  TROPONINI 0.59*   ------------------------------------------------------------  RADIOLOGY:  No results found.     Thank  you for the consultation and for allowing [facility] [place] [facility] to assist in the care of your patient. Our recommendations are noted above.  Please contact us if we can be of further service.   [name], MD.  Board Certified in Internal Medicine, Pulmonary Medicine, Critical Care Medicine, and Sleep Medicine.   Pulmonary and Critical Care Office Number: [phone]  Santiago Glad, M.D.  Billy Fischer, M.D  07/24/2017

## 2017-07-24 NOTE — ED Triage Notes (Signed)
She arrives via [facility] from home where they were called for a welfare check by a friend  Pt found disoriented with scattered ecchymosis pt verbalizing upon arrival that she uses heroin everyday "I don't want help but they made me come here."   Belongings secured for safety  IVC papers brought by ac[employer]

## 2017-07-24 NOTE — Consult Note (Signed)
[place] Psychiatry Consult   Reason for Consult:  Consult for 36 year old woman with a history of drug abuse who came into the hospital agitated and confused Referring Physician:  McShane Patient Identification: [name] MRN:  [id number] Principal Diagnosis: Delirium, drug-induced [facility]) Diagnosis:   Patient Active Problem List   Diagnosis Date Noted  . Altered mental status [R41.82] 07/24/2017  . Delirium, drug-induced ([place]) [Q76.195] 07/24/2017  . 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 Amphetamine abuse [F15.10] 07/24/2017  . Severe sepsis ([place]) [A41.9, R65.20] 07/16/2016  . AKI (acute kidney injury) ([place]) [N17.9] 07/16/2016  . Obtundation [R40.1] 07/16/2016  . Elevated lactic acid level [R79.89] 07/16/2016  . 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 Elevated LFTs [R79.89] 07/16/2016  . Sepsis ([place]) [A41.9] 07/10/2015  . Oligohydramnios [O41.00X0] 05/18/2015  . S/P cesarean section [Z98.891] 05/18/2015  . Preterm uterine contractions [O47.9] 05/17/2015  . Oligohydramnios antepartum [O41.00X0] 05/17/2015  . H/O cesarean section complicating pregnancy [K93.267] 05/17/2015  . Preterm contractions [O47.9] 05/17/2015  . AA (alcohol abuse) [F10.10] 12/08/2014  . Drug abuse during pregnancy [O99.320, F19.10] 12/08/2014  . High risk sexual behavior [Z72.51] 12/08/2014  . Tenosynovitis [M65.9] 08/19/2014  . Acute deep vein thrombosis of arm (HCC) [I82.629] 08/19/2014  . HCV (hepatitis C virus) [B19.20] 07/17/2013  . H/O cesarean section [Z98.891] 05/18/2013  . Herpes infection in pregnancy [O98.519, B00.9] 05/18/2013  . Infection with methicillin-resistant Staphylococcus aureus [A49.02] 03/19/2013  . Cannabis abuse [F12.10] 03/18/2013  . Polysubstance abuse [F19.10] 03/18/2013  . Compulsive tobacco user syndrome [F17.200] 03/17/2013    Total Time spent with patient: 1 hour  Subjective:   [name] is a 36 y.o. female patient admitted with patient not  able to give any information.  HPI:  Patient seen chart reviewed. This is a 36 year old woman with a history of multiple drug abuse. Apparently came into the hospital and initially was somewhat lucid but quickly deteriorated. By the time I saw her she was moaning and screaming and writhing around on the bed bizarrely. She was not able to give any lucid history. Couldn't very clearly are consistently state how many or what drug she had been using or when.  Social history: Unknown  Medical history: History of endocarditis and deep vein thrombosis asthma hepatitis C multiple complications of drug abuse  Substance abuse history: Long-standing heavy drug abuse. Has not apparently been seen by psychiatry here. Not able to give any history.  Past Psychiatric History: Patient apparently has long-standing drug abuse problems although we are limited in the amount of psychiatric information we have. Patient not able to give information about past history of treatment.  Risk to Self: Is patient at risk for suicide?:  (family says pt has threatened to harm herself today, at this) Risk to Others:   Prior Inpatient Therapy:   Prior Outpatient Therapy:    Past Medical History:  Past Medical History:  Diagnosis Date  . Acute deep vein thrombosis of arm (HCC) 08/19/2014   Last Assessment & Plan:  - diagnosed 08/2014 in hospital - s/p heparin gtt - patient never took her xarelto x 3 month course that was planned - no symptoms today.  No further treatment - we discussed avoiding IVDU (see below) to reduce her chances of provoked upper extremity DVT   . Asthma   . HCV (hepatitis C virus) 07/17/2013   Last Assessment & Plan:  - positive antibody most recently 08/2014 - hep C RNA 08/2014 was negative - prior to that,  HCV RNA [id number] in 06/2013. No record for genotype or imaging.   - patient counseled that she could become re-infected with continued IVDU or unprotected sexual encounters.   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   Last Assessment & Plan:  Discussed routine use of Valtrex prophylaxis at 36 weeks. She will be delivered by cesarean at 36-37 weeks.   . Infection with methicillin-resistant Staphylococcus aureus 03/19/2013   Overview: Arm abscess with MRSA, treated.  Subsequent screening negative  . IV drug abuse   . Tenosynovitis 08/19/2014    Past Surgical History:  Procedure Laterality Date  . CESAREAN SECTION    . CESAREAN SECTION N/A 05/18/2015   Procedure: CESAREAN SECTION;  Surgeon: Rubie Maid, MD;  Location: [facility] ORS;  Service: Obstetrics;  Laterality: N/A;  . NECK SURGERY     Fusion   Family History:  Family History  Problem Relation Age of Onset  . Diabetes Mother   . COPD Mother   . Hypertension Mother   . Lung cancer Mother   . Alcohol abuse Father   . Lung cancer Maternal Grandmother    Family Psychiatric  History: Unknown Social History:  History  Alcohol Use  . 0.0 oz/week    Comment: bootlegger x2     History  Drug Use  . Types: Marijuana, Cocaine, Heroin, IV    Comment: uses IV drugs, heroin    Social History   Social History  . Marital status: Single    Spouse name: N/A  . Number of children: N/A  . Years of education: N/A   Social History Main Topics  . Smoking status: Current Every Day Smoker    Packs/day: 1.00    Years: 7.00    Types: Cigarettes  . Smokeless tobacco: Never Used  . Alcohol use 0.0 oz/week     Comment: bootlegger x2  . Drug use: Yes    Types: Marijuana, Cocaine, Heroin, IV     Comment: uses IV drugs, heroin  . Sexual activity: Yes    Birth control/ protection: None   Other Topics Concern  . Not on file   Social History Narrative   Lives at home with mother   Additional Social History:    Allergies:   Allergies  Allergen Reactions  . Amoxicillin Rash  . Latex   . Vancomycin   . Clindamycin/Lincomycin Rash    Labs:  Results for orders placed or  performed during the hospital encounter of 07/24/17 (from the past 48 hour(s))  Urine Drug Screen, Qualitative ([place] only)     Status: Abnormal   Collection Time: 07/24/17  1:29 PM  Result Value Ref Range   Tricyclic, Ur Screen NONE DETECTED NONE DETECTED   Amphetamines, Ur Screen POSITIVE (A) NONE DETECTED   MDMA (Ecstasy)Ur Screen NONE DETECTED NONE DETECTED   Cocaine Metabolite,Ur [place] POSITIVE (A) NONE DETECTED   Opiate, Ur Screen POSITIVE (A) NONE DETECTED   Phencyclidine (PCP) Ur S NONE DETECTED NONE DETECTED   Cannabinoid 50 Ng, Ur [place] POSITIVE (A) NONE DETECTED   Barbiturates, Ur Screen NONE DETECTED NONE DETECTED   Benzodiazepine, Ur Scrn NONE DETECTED NONE DETECTED   Methadone Scn, Ur NONE DETECTED NONE DETECTED    Comment: (NOTE) 659  Tricyclics, urine               Cutoff 1000 ng/mL 200  Amphetamines, urine             Cutoff  1000 ng/mL 300  MDMA (Ecstasy), urine           Cutoff 500 ng/mL 400  Cocaine Metabolite, urine       Cutoff 300 ng/mL 500  Opiate, urine                   Cutoff 300 ng/mL 600  Phencyclidine (PCP), urine      Cutoff 25 ng/mL 700  Cannabinoid, urine              Cutoff 50 ng/mL 800  Barbiturates, urine             Cutoff 200 ng/mL 900  Benzodiazepine, urine           Cutoff 200 ng/mL 1000 Methadone, urine                Cutoff 300 ng/mL 1100 1200 The urine drug screen provides only a preliminary, unconfirmed 1300 analytical test result and should not be used for non-medical 1400 purposes. Clinical consideration and professional judgment should 1500 be applied to any positive drug screen result due to possible 1600 interfering substances. A more specific alternate chemical method 1700 must be used in order to obtain a confirmed analytical result.  1800 Gas chromato graphy / mass spectrometry (GC/MS) is the preferred 1900 confirmatory method.   CBC with Differential/Platelet     Status: Abnormal   Collection Time: 07/24/17  1:31 PM  Result Value Ref  Range   WBC 32.8 (H) 3.6 - 11.0 K/uL   RBC 3.98 3.80 - 5.20 MIL/uL   Hemoglobin 11.9 (L) 12.0 - 16.0 g/dL   HCT 35.2 35.0 - 47.0 %   MCV 88.4 80.0 - 100.0 fL   MCH 29.9 26.0 - 34.0 pg   MCHC 33.8 32.0 - 36.0 g/dL   RDW 15.7 (H) 11.5 - 14.5 %   Platelets 340 150 - 440 K/uL   Neutrophils Relative % 84 %   Lymphocytes Relative 7 %   Monocytes Relative 4 %   Eosinophils Relative 0 %   Basophils Relative 0 %   Band Neutrophils 5 %   Metamyelocytes Relative 0 %   Myelocytes 0 %   Promyelocytes Absolute 0 %   Blasts 0 %   nRBC 0 0 /100 WBC   Other 0 %   Neutro Abs 29.2 (H) 1.4 - 6.5 K/uL   Lymphs Abs 2.3 1.0 - 3.6 K/uL   Monocytes Absolute 1.3 (H) 0.2 - 0.9 K/uL   Eosinophils Absolute 0.0 0 - 0.7 K/uL   Basophils Absolute 0.0 0 - 0.1 K/uL   RBC Morphology MIXED RBC POPULATION   Comprehensive metabolic panel     Status: Abnormal   Collection Time: 07/24/17  1:31 PM  Result Value Ref Range   Sodium 130 (L) 135 - 145 mmol/L   Potassium 4.6 3.5 - 5.1 mmol/L    Comment: HEMOLYSIS AT THIS LEVEL MAY AFFECT RESULT   Chloride 99 (L) 101 - 111 mmol/L   CO2 16 (L) 22 - 32 mmol/L   Glucose, Bld 104 (H) 65 - 99 mg/dL   BUN 67 (H) 6 - 20 mg/dL   Creatinine, Ser 3.80 (H) 0.44 - 1.00 mg/dL   Calcium 8.2 (L) 8.9 - 10.3 mg/dL   Total Protein 8.5 (H) 6.5 - 8.1 g/dL   Albumin 4.3 3.5 - 5.0 g/dL   AST 447 (H) 15 - 41 U/L   ALT 161 (H) 14 - 54 U/L   Alkaline Phosphatase  104 38 - 126 U/L   Total Bilirubin 1.3 (H) 0.3 - 1.2 mg/dL   GFR calc non Af Amer 14 (L) >60 mL/min   GFR calc Af Amer 17 (L) >60 mL/min    Comment: (NOTE) The eGFR has been calculated using the CKD EPI equation. This calculation has not been validated in all clinical situations. eGFR's persistently <60 mL/min signify possible Chronic Kidney Disease.    Anion gap 15 5 - 15  Troponin I     Status: Abnormal   Collection Time: 07/24/17  1:31 PM  Result Value Ref Range   Troponin I 0.59 (HH) <0.03 ng/mL    Comment: CRITICAL  RESULT CALLED TO, READ BACK BY AND VERIFIED WITH AMY TIGGS ON 07/24/17 AT 1415 BY JAG   Ethanol     Status: None   Collection Time: 07/24/17  1:31 PM  Result Value Ref Range   Alcohol, Ethyl (B) <5 <5 mg/dL    Comment:        LOWEST DETECTABLE LIMIT FOR SERUM ALCOHOL IS 5 mg/dL FOR MEDICAL PURPOSES ONLY   Acetaminophen level     Status: Abnormal   Collection Time: 07/24/17  1:31 PM  Result Value Ref Range   Acetaminophen (Tylenol), Serum <10 (L) 10 - 30 ug/mL    Comment:        THERAPEUTIC CONCENTRATIONS VARY SIGNIFICANTLY. A RANGE OF 10-30 ug/mL MAY BE AN EFFECTIVE CONCENTRATION FOR MANY PATIENTS. HOWEVER, SOME ARE BEST TREATED AT CONCENTRATIONS OUTSIDE THIS RANGE. ACETAMINOPHEN CONCENTRATIONS >150 ug/mL AT 4 HOURS AFTER INGESTION AND >50 ug/mL AT 12 HOURS AFTER INGESTION ARE OFTEN ASSOCIATED WITH TOXIC REACTIONS.   Salicylate level     Status: None   Collection Time: 07/24/17  1:31 PM  Result Value Ref Range   Salicylate Lvl <3.3 2.8 - 30.0 mg/dL  Protime-INR     Status: Abnormal   Collection Time: 07/24/17  1:56 PM  Result Value Ref Range   Prothrombin Time 16.9 (H) 11.4 - 15.2 seconds   INR 1.39   Fibrinogen     Status: None   Collection Time: 07/24/17  1:56 PM  Result Value Ref Range   Fibrinogen 307 210 - 475 mg/dL  Glucose, capillary     Status: Abnormal   Collection Time: 07/24/17  5:11 PM  Result Value Ref Range   Glucose-Capillary 125 (H) 65 - 99 mg/dL    Current Facility-Administered Medications  Medication Dose Route Frequency Provider Last Rate Last Dose  . 0.9 %  sodium chloride infusion   Intravenous Once Earleen Newport, MD      . 0.9 %  sodium chloride infusion   Intravenous Continuous [name], MD 75 mL/hr at 07/24/17 1900    . dexmedetomidine (PRECEDEX) 400 MCG/100ML (4 mcg/mL) infusion  0.4-1.2 mcg/kg/hr Intravenous Titrated [name], MD      . docusate sodium (COLACE) capsule 100 mg  100 mg Oral BID PRN  [name], MD      . heparin injection 5,000 Units  5,000 Units Subcutaneous Q8H [name], MD      . LORazepam (ATIVAN) injection 2 mg  2 mg Intravenous Once [name], MD        Musculoskeletal: Strength & Muscle Tone: decreased Gait & Station: unable to stand Patient leans: N/A  Psychiatric Specialty Exam: Physical Exam  Nursing note and vitals reviewed. Constitutional: She appears well-developed.  HENT:  Head: Normocephalic and atraumatic.  Eyes: Pupils are equal, round, and reactive to light. Conjunctivae are  normal.  Neck: Normal range of motion.  Cardiovascular: Normal heart sounds.   Respiratory: Effort normal.  GI: Soft.  Musculoskeletal: Normal range of motion.  Neurological: She is alert.  Skin: Skin is warm and dry.     Psychiatric: Her affect is labile and inappropriate. Her speech is tangential. She is agitated. Cognition and memory are impaired. She expresses impulsivity and inappropriate judgment. She is noncommunicative. She exhibits abnormal recent memory.    Review of Systems  Unable to perform ROS: Psychiatric disorder    Blood pressure (!) 165/104, pulse (!) 106, temperature 98.2 F (36.8 C), temperature source Axillary, resp. rate 14, height _0  (1.626 m), weight 55.1 kg (121 lb 7.6 oz), SpO2 99 %.Body mass index is 20.85 kg/m.  General Appearance: Disheveled  Eye Contact:  None  Speech:  Garbled  Volume:  Decreased  Mood:  Irritable  Affect:  Inappropriate and Labile  Thought Process:  Disorganized  Orientation:  Negative  Thought Content:  Negative  Suicidal Thoughts:  No  Homicidal Thoughts:  No  Memory:  Negative  Judgement:  Negative  Insight:  Negative  Psychomotor Activity:  Increased, Mannerisms and Restlessness  Concentration:  Concentration: Poor  Recall:  Poor  Fund of Knowledge:  Poor  Language:  Poor  Akathisia:  No  Handed:  Right  AIMS (if indicated):     Assets:  Desire for  Improvement  ADL's:  Impaired  Cognition:  Impaired,  Moderate  Sleep:        Treatment Plan Summary: Plan 36 year old woman presented with delirium probably drug-induced or withdrawal. Needed to be admitted to the hospital because of probable endocarditis. Patient is likely to be sedated with Precedex or multiple benzodiazepines in the emergency room and ICU. Because of the lack of clarity about when she was last taking any drugs I don't feel very comfortable putting her on standing Suboxone. No specific need for new psychiatric medicines otherwise. I will follow-up as needed in the hospital.  Disposition: Supportive therapy provided about ongoing stressors.  Alethia Berthold, MD 07/24/2017 7:44 PM

## 2017-07-24 NOTE — ED Notes (Signed)
PT  IVC 

## 2017-07-24 NOTE — ED Provider Notes (Signed)
[facility] Emergency Department Provider Note       Time seen: ----------------------------------------- 1:29 PM on 07/24/2017 -----------------------------------------     I have reviewed the triage vital signs and the nursing notes.   HISTORY   Chief Complaint Addiction Problem and Psychiatric Evaluation    HPI [name] is a 36 y.o. female who presents to the ED for diffuse pain as well as nausea, vomiting and diarrhea. Patient states she is withdrawing from heroin and is having severe withdrawal symptoms. Police and EMS were involved because she appeared bruised from head to toe and she cannot explain these bruises. She is brought for further evaluation due to the bruising. Patient states she does not want detox at this time. Patient thinks she may be bruising herself in her sleep.   Past Medical History:  Diagnosis Date  . Acute deep vein thrombosis of arm (HCC) 08/19/2014   Last Assessment & Plan:  - diagnosed 08/2014 in hospital - s/p heparin gtt - patient never took her xarelto x 3 month course that was planned - no symptoms today.  No further treatment - we discussed avoiding IVDU (see below) to reduce her chances of provoked upper extremity DVT   . Asthma   . HCV (hepatitis C virus) 07/17/2013   Last Assessment & Plan:  - positive antibody most recently 08/2014 - hep C RNA 08/2014 was negative - prior to that, HCV RNA [id number] in 06/2013. No record for genotype or imaging.   - patient counseled that she could become re-infected with continued IVDU or unprotected sexual encounters.   Marland Kitchen. 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   Last Assessment & Plan:  Discussed routine use of Valtrex prophylaxis at 36 weeks. She will be delivered by cesarean at 36-37 weeks.   . Infection with methicillin-resistant Staphylococcus aureus 03/19/2013   Overview: Arm abscess with MRSA, treated.  Subsequent screening  negative  . IV drug abuse   . Tenosynovitis 08/19/2014    Patient Active Problem List   Diagnosis Date Noted  . Severe sepsis (HCC) 07/16/2016  . AKI (acute kidney injury) (HCC) 07/16/2016  . Obtundation 07/16/2016  . Elevated lactic acid level 07/16/2016  . Drug abuse, IV 07/16/2016  . Elevated LFTs 07/16/2016  . Sepsis (HCC) 07/10/2015  . Oligohydramnios 05/18/2015  . S/P cesarean section 05/18/2015  . Preterm uterine contractions 05/17/2015  . Oligohydramnios antepartum 05/17/2015  . H/O cesarean section complicating pregnancy 05/17/2015  . Preterm contractions 05/17/2015  . AA (alcohol abuse) 12/08/2014  . Drug abuse during pregnancy 12/08/2014  . High risk sexual behavior 12/08/2014  . Tenosynovitis 08/19/2014  . Acute deep vein thrombosis of arm (HCC) 08/19/2014  . HCV (hepatitis C virus) 07/17/2013  . H/O cesarean section 05/18/2013  . Herpes infection in pregnancy 05/18/2013  . Infection with methicillin-resistant Staphylococcus aureus 03/19/2013  . Cannabis abuse 03/18/2013  . Polysubstance abuse 03/18/2013  . Compulsive tobacco user syndrome 03/17/2013    Past Surgical History:  Procedure Laterality Date  . CESAREAN SECTION    . CESAREAN SECTION N/A 05/18/2015   Procedure: CESAREAN SECTION;  Surgeon: Hildred LaserAnika Cherry, MD;  Location: [facility] ORS;  Service: Obstetrics;  Laterality: N/A;  . NECK SURGERY     Fusion    Allergies Amoxicillin; Latex; Vancomycin; and Clindamycin/lincomycin  Social History Social History  Substance Use Topics  . Smoking status: Current Every Day Smoker    Packs/day: 1.00    Years:  7.00    Types: Cigarettes  . Smokeless tobacco: Never Used  . Alcohol use 0.0 oz/week     Comment: bootlegger x2    Review of Systems Constitutional: Negative for fever. Cardiovascular: Negative for chest pain. Respiratory: Negative for shortness of breath. Gastrointestinal: Negative for abdominal pain, positive for nausea, vomiting and  diarrhea Genitourinary: Negative for dysuria. Musculoskeletal: Negative for back pain. Skin: positive for ecchymosis Neurological: Negative for headaches, focal weakness or numbness.  All systems negative/normal/unremarkable except as stated in the HPI  ____________________________________________   PHYSICAL EXAM:  VITAL SIGNS: ED Triage Vitals [07/24/17 1321]  Enc Vitals Group     BP      Pulse      Resp      Temp      Temp src      SpO2      Weight 115 lb (52.2 kg)     Height  (1.6 m)     Head Circumference      Peak Flow      Pain Score 10     Pain Loc      Pain Edu?      Excl. in GC?    Constitutional: Alert and oriented. agitated, mild distress Eyes: Conjunctivae are normal. Normal extraocular movements. ENT   Head: Normocephalic and atraumatic.   Nose: No congestion/rhinnorhea.   Mouth/Throat: Mucous membranes are moist.   Neck: No stridor. Cardiovascular: Normal rate, regular rhythm. possible murmur noted Respiratory: Normal respiratory effort without tachypnea nor retractions. Breath sounds are clear and equal bilaterally. No wheezes/rales/rhonchi. Gastrointestinal: Soft and nontender. Normal bowel sounds Musculoskeletal: Nontender with normal range of motion in extremities. No lower extremity tenderness nor edema. Neurologic:  Normal speech and language. No gross focal neurologic deficits are appreciated.  Skin:  extensive scattered ecchymosis that his subacute, scattered from head to toe. Psychiatric: Mood and affect are normal.  ____________________________________________  ED COURSE:  Pertinent labs & imaging results that were available during my care of the patient were reviewed by me and considered in my medical decision making (see chart for details). Patient presents for ecchymosis as well as IV drug abuse and likely narcotic withdrawal, we will assess with labs and imaging as indicated.    Procedures ____________________________________________   LABS (pertinent positives/negatives)  Labs Reviewed  CBC WITH DIFFERENTIAL/PLATELET - Abnormal; Notable for the following:       Result Value   WBC 32.8 (*)    Hemoglobin 11.9 (*)    RDW 15.7 (*)    All other components within normal limits  COMPREHENSIVE METABOLIC PANEL - Abnormal; Notable for the following:    Sodium 130 (*)    Chloride 99 (*)    CO2 16 (*)    Glucose, Bld 104 (*)    BUN 67 (*)    Creatinine, Ser 3.80 (*)    Calcium 8.2 (*)    Total Protein 8.5 (*)    AST 447 (*)    ALT 161 (*)    Total Bilirubin 1.3 (*)    GFR calc non Af Amer 14 (*)    GFR calc Af Amer 17 (*)    All other components within normal limits  TROPONIN I - Abnormal; Notable for the following:    Troponin I 0.59 (*)    All other components within normal limits  URINE DRUG SCREEN, QUALITATIVE ([facility] ONLY) - Abnormal; Notable for the following:    Amphetamines, Ur Screen POSITIVE (*)    Cocaine Metabolite,Ur [place]  POSITIVE (*)    Opiate, Ur Screen POSITIVE (*)    Cannabinoid 50 Ng, Ur [place] POSITIVE (*)    All other components within normal limits  ACETAMINOPHEN LEVEL - Abnormal; Notable for the following:    Acetaminophen (Tylenol), Serum <10 (*)    All other components within normal limits  CULTURE, BLOOD (ROUTINE X 2)  CULTURE, BLOOD (ROUTINE X 2)  CULTURE, BLOOD (SINGLE)  ETHANOL  SALICYLATE LEVEL  PROTIME-INR  FIBRINOGEN   CRITICAL CARE Performed by: Emily Filbert   Total critical care time: 30 minutes  Critical care time was exclusive of separately billable procedures and treating other patients.  Critical care was necessary to treat or prevent imminent or life-threatening deterioration.  Critical care was time spent personally by me on the following activities: development of treatment plan with patient and/or surrogate as well as nursing, discussions with consultants, evaluation of patient's response to treatment,  examination of patient, obtaining history from patient or surrogate, ordering and performing treatments and interventions, ordering and review of laboratory studies, ordering and review of radiographic studies, pulse oximetry and re-evaluation of patient's condition.  ____________________________________________  FINAL ASSESSMENT AND PLAN  ecchymosis, narcotic withdrawal, acute renal failure, elevated troponin, possible endocarditis, IV drug abuse  Plan: Patient's labs and imaging were dictated above. Patient had presented for severe withdrawal symptoms likely from narcotic withdrawal but she has abnormal lab findings consistent with acute renal failure,elevated troponin, marked leukocytosis and possible endocarditis. She will need to be admitted, receive IV antibiotics, fluids, anticoagulation, infectious disease consult and nephrology consult. Currently she has received some medications to ease her withdrawal symptoms. I will discuss with the hospitalist for admission.   Emily Filbert, MD   Note: This note was generated in part or whole with voice recognition software. Voice recognition is usually quite accurate but there are transcription errors that can and very often do occur. I apologize for any typographical errors that were not detected and corrected.     Emily Filbert, MD 07/24/17 [phone]

## 2017-07-24 NOTE — Progress Notes (Signed)
1525; attempted to place piv x's 3. Patient has uncontrollable violent leg and arm movement. Order for PICC to be place. This patient is not a suitable candidate at this time.

## 2017-07-24 NOTE — H&P (Signed)
[employer] -  at [facility]   PATIENT NAME: [name]    MR#:  [id number]  DATE OF BIRTH:  08/15/1981  DATE OF ADMISSION:  07/24/2017  PRIMARY CARE PHYSICIAN: Patient, No Pcp Per   REQUESTING/REFERRING PHYSICIAN: Williams  CHIEF COMPLAINT:   Chief Complaint  Patient presents with  . Addiction Problem  . Psychiatric Evaluation    HISTORY OF PRESENT ILLNESS: Altheria Shadoan  is a 36 y.o. female with a known history of HCV, IV drug use, Endocarditis in past- brought by police for iratic behaviour, she is very agitated and jumping in bed, being held by 3 people in ER when I saw her. Not able to give any details. She has multiple bruises all over her body including her head , arms back, thighs and legs. But she denies anybody hurting her. Multiple IV marks on her arms and her neck. Her urine is noted to be positive for multiple drugs including cocaine, amphetamine, cannabis- white blood cell count is 32,000 which raises high suspicion of endocarditis and she is also in acute renal failure.  PAST MEDICAL HISTORY:   Past Medical History:  Diagnosis Date  . Acute deep vein thrombosis of arm (HCC) 08/19/2014   Last Assessment & Plan:  - diagnosed 08/2014 in hospital - s/p heparin gtt - patient never took her xarelto x 3 month course that was planned - no symptoms today.  No further treatment - we discussed avoiding IVDU (see below) to reduce her chances of provoked upper extremity DVT   . Asthma   . HCV (hepatitis C virus) 07/17/2013   Last Assessment & Plan:  - positive antibody most recently 08/2014 - hep C RNA 08/2014 was negative - prior to that, HCV RNA [id number] in 06/2013. No record for genotype or imaging.   - patient counseled that she could become re-infected with continued IVDU or unprotected sexual encounters.   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   Last Assessment & Plan:  Discussed routine use of  Valtrex prophylaxis at 36 weeks. She will be delivered by cesarean at 36-37 weeks.   . Infection with methicillin-resistant Staphylococcus aureus 03/19/2013   Overview: Arm abscess with MRSA, treated.  Subsequent screening negative  . IV drug abuse   . Tenosynovitis 08/19/2014    PAST SURGICAL HISTORY: Past Surgical History:  Procedure Laterality Date  . CESAREAN SECTION    . CESAREAN SECTION N/A 05/18/2015   Procedure: CESAREAN SECTION;  Surgeon: Hildred Laser, MD;  Location: [facility] ORS;  Service: Obstetrics;  Laterality: N/A;  . NECK SURGERY     Fusion    SOCIAL HISTORY:  Social History  Substance Use Topics  . Smoking status: Current Every Day Smoker    Packs/day: 1.00    Years: 7.00    Types: Cigarettes  . Smokeless tobacco: Never Used  . Alcohol use 0.0 oz/week     Comment: bootlegger x2    FAMILY HISTORY:  Family History  Problem Relation Age of Onset  . Diabetes Mother   . COPD Mother   . Hypertension Mother   . Lung cancer Mother   . Alcohol abuse Father   . Lung cancer Maternal Grandmother     DRUG ALLERGIES:  Allergies  Allergen Reactions  . Amoxicillin Rash  . Latex   . Vancomycin   . Clindamycin/Lincomycin Rash    REVIEW OF SYSTEMS:   Patient is agitated and not able to give  any details.  MEDICATIONS AT HOME:  Prior to Admission medications   Not on File      PHYSICAL EXAMINATION:   VITAL SIGNS: Height  (1.6 m), weight 52.2 kg (115 lb).  GENERAL:  36 y.o.-year-old patient lying in the bed , Very agitated and constantly jumping and moving being held by 3 people in ER while trying to get her blood sample.  EYES: Pupils equal, round, reactive to light. No scleral icterus. Extraocular muscles intact.  HEENT: Head echymosis on forehead, normocephalic. Oropharynx and nasopharynx clear.  NECK:  Supple, no jugular venous distention. No thyroid enlargement, no tenderness.  LUNGS: Normal breath sounds bilaterally, no wheezing, rales,rhonchi or  crepitation. No use of accessory muscles of respiration.  CARDIOVASCULAR: S1, S2 normal. No murmurs, rubs, or gallops.  ABDOMEN: Soft, nontender, nondistended. Bowel sounds present. No organomegaly or mass.  EXTREMITIES: No pedal edema, cyanosis, or clubbing. Multiple injury marks and IV marks. NEUROLOGIC: Cannot do neurologic exam as patient is completely agitated and does not follow, but she is moving all 4 limbs. PSYCHIATRIC: The patient is alert and very agitated.  SKIN: No obvious rash, lesion, or ulcer.   LABORATORY PANEL:   CBC  Recent Labs Lab 07/24/17 1331  WBC 32.8*  HGB 11.9*  HCT 35.2  PLT 340  MCV 88.4  MCH 29.9  MCHC 33.8  RDW 15.7*  LYMPHSABS 2.3  MONOABS 1.3*  EOSABS 0.0  BASOSABS 0.0   ------------------------------------------------------------------------------------------------------------------  Chemistries   Recent Labs Lab 07/24/17 1331  NA 130*  K 4.6  CL 99*  CO2 16*  GLUCOSE 104*  BUN 67*  CREATININE 3.80*  CALCIUM 8.2*  AST 447*  ALT 161*  ALKPHOS 104  BILITOT 1.3*   ------------------------------------------------------------------------------------------------------------------ estimated creatinine clearance is 17.2 mL/min (A) (by C-G formula based on SCr of 3.8 mg/dL (H)). ------------------------------------------------------------------------------------------------------------------ No results for input(s): TSH, T4TOTAL, T3FREE, THYROIDAB in the last 72 hours.  Invalid input(s): FREET3   Coagulation profile  Recent Labs Lab 07/24/17 1356  INR 1.39   ------------------------------------------------------------------------------------------------------------------- No results for input(s): DDIMER in the last 72 hours. -------------------------------------------------------------------------------------------------------------------  Cardiac Enzymes  Recent Labs Lab 07/24/17 1331  TROPONINI 0.59*    ------------------------------------------------------------------------------------------------------------------ Invalid input(s): POCBNP  ---------------------------------------------------------------------------------------------------------------  Urinalysis    Component Value Date/Time   COLORURINE YELLOW (A) 04/06/2017 2150   APPEARANCEUR HAZY (A) 04/06/2017 2150   APPEARANCEUR Clear 12/14/2014 1748   LABSPEC 1.012 04/06/2017 2150   LABSPEC 1.009 12/14/2014 1748   PHURINE 6.0 04/06/2017 2150   GLUCOSEU NEGATIVE 04/06/2017 2150   GLUCOSEU Negative 12/14/2014 1748   HGBUR NEGATIVE 04/06/2017 2150   BILIRUBINUR NEGATIVE 04/06/2017 2150   BILIRUBINUR Neg 05/13/2015 1222   BILIRUBINUR Negative 12/14/2014 1748   KETONESUR NEGATIVE 04/06/2017 2150   PROTEINUR 100 (A) 04/06/2017 2150   UROBILINOGEN 0.2 05/13/2015 1222   UROBILINOGEN 0.2 09/10/2008 0821   NITRITE NEGATIVE 04/06/2017 2150   LEUKOCYTESUR NEGATIVE 04/06/2017 2150   LEUKOCYTESUR Trace 12/14/2014 1748     RADIOLOGY: No results found.  EKG: Orders placed or performed during the hospital encounter of 07/24/17  . ED EKG  . ED EKG    IMPRESSION AND PLAN:  * Altered mental status due to toxic encephalopathy, drug withdrawal   Multiple IV drug use.   Urine toxicology is positive for cocaine, cannabis, amphetamines.   Ethanol level is low.   IV Ativan one time and then precedex drip.   Monitor in ICU.    May need Central line.  * Elevated white  blood cell count   Most likely endocarditis with use of IV drugs   Daptomycin for now and infectious disease consult.   Echocardiogram.  * Acute renal failure   Worsening in renal function, likely due to continued IV drug use, may need to monitor with IV fluids.  * Elevated LFTs   Likely due to hepatitis and drug use, monitor.  All the records are reviewed and case discussed with ED provider. Management plans discussed with the patient, family and they are  in agreement.  CODE STATUS: Full code Code Status History    Date Active Date Inactive Code Status Order ID Comments User Context   07/16/2016  6:21 PM 07/18/2016 12:57 PM Full Code [id number]  Merwyn KatosSimonds, David B, MD ED   07/10/2015 11:02 AM 07/12/2015 11:38 PM Full Code [id number]  Enid BaasKalisetti, Radhika, MD Inpatient   05/18/2015  6:25 PM 05/22/2015  2:56 AM Full Code [id number]  Hildred Laserherry, Anika, MD Inpatient   05/17/2015 10:00 PM 05/18/2015  6:25 PM Full Code [id number]  Hildred Laserherry, Anika, MD Inpatient       TOTAL TIME TAKING CARE OF THIS PATIENT: 50 critical care minutes.  I spoke to Dr. Belia HemanKasa for urgent help of intensivist.  Altamese DillingVACHHANI, Jacarra Bobak M.D on 07/24/2017   Between 7am to 6pm - Pager - [phone]  After 6pm go to [email or web address] - password [email or web address]  [facility]  Office  [phone]  CC: Primary care physician; Patient, No Pcp Per   Note: This dictation was prepared with Dragon dictation along with smaller phrase technology. Any transcriptional errors that result from this process are unintentional.

## 2017-07-24 NOTE — ED Notes (Signed)
BEHAVIORAL HEALTH ROUNDING Patient sleeping: No. Patient alert and oriented: yes Behavior appropriate: Yes.  ; If no, describe:  Nutrition and fluids offered: yes Toileting and hygiene offered: Yes  Sitter present: q15 minute observations and security  monitoring Law enforcement present: Yes  ODS  

## 2017-07-25 ENCOUNTER — Inpatient Hospital Stay: Payer: Self-pay

## 2017-07-25 ENCOUNTER — Inpatient Hospital Stay
Admit: 2017-07-25 | Discharge: 2017-07-25 | Disposition: A | Discharge status: Home / Self Care | Payer: Self-pay | Attending: Internal Medicine | Admitting: Internal Medicine

## 2017-07-25 DIAGNOSIS — N179 Acute kidney failure, unspecified: Secondary | ICD-10-CM

## 2017-07-25 LAB — PROTEIN / CREATININE RATIO, URINE
Creatinine, Urine: 42 mg/dL
PROTEIN CREATININE RATIO: 5.5 mg/mg{creat} — AB (ref 0.00–0.15)
TOTAL PROTEIN, URINE: 231 mg/dL

## 2017-07-25 LAB — CBC
HCT: 32.2 % — ABNORMAL LOW (ref 35.0–47.0)
HEMOGLOBIN: 11.1 g/dL — AB (ref 12.0–16.0)
MCH: 30.8 pg (ref 26.0–34.0)
MCHC: 34.5 g/dL (ref 32.0–36.0)
MCV: 89.2 fL (ref 80.0–100.0)
Platelets: 266 10*3/uL (ref 150–440)
RBC: 3.62 MIL/uL — AB (ref 3.80–5.20)
RDW: 15.9 % — ABNORMAL HIGH (ref 11.5–14.5)
WBC: 26.6 10*3/uL — ABNORMAL HIGH (ref 3.6–11.0)

## 2017-07-25 LAB — BASIC METABOLIC PANEL
Anion gap: 13 (ref 5–15)
BUN: 82 mg/dL — ABNORMAL HIGH (ref 6–20)
CHLORIDE: 96 mmol/L — AB (ref 101–111)
CO2: 18 mmol/L — AB (ref 22–32)
CREATININE: 5.06 mg/dL — AB (ref 0.44–1.00)
Calcium: 8.3 mg/dL — ABNORMAL LOW (ref 8.9–10.3)
GFR calc Af Amer: 12 mL/min — ABNORMAL LOW (ref >60)
GFR calc non Af Amer: 10 mL/min — ABNORMAL LOW (ref >60)
Glucose, Bld: 95 mg/dL (ref 65–99)
POTASSIUM: 4.2 mmol/L (ref 3.5–5.1)
Sodium: 127 mmol/L — ABNORMAL LOW (ref 135–145)

## 2017-07-25 LAB — URINALYSIS, COMPLETE (UACMP) WITH MICROSCOPIC
Bacteria, UA: NONE SEEN
Bilirubin Urine: NEGATIVE
GLUCOSE, UA: 50 mg/dL — AB
KETONES UR: NEGATIVE mg/dL
Leukocytes, UA: NEGATIVE
NITRITE: NEGATIVE
PROTEIN: 100 mg/dL — AB
Specific Gravity, Urine: 1.009 (ref 1.005–1.030)
pH: 6 (ref 5.0–8.0)

## 2017-07-25 LAB — GLUCOSE, CAPILLARY: Glucose-Capillary: 59 mg/dL — ABNORMAL LOW (ref 65–99)

## 2017-07-25 LAB — PROCALCITONIN: Procalcitonin: 84.3 ng/mL

## 2017-07-25 LAB — TROPONIN I: Troponin I: 0.05 ng/mL (ref <0.03)

## 2017-07-25 LAB — TSH: TSH: 0.18 u[IU]/mL — ABNORMAL LOW (ref 0.350–4.500)

## 2017-07-25 LAB — ECHOCARDIOGRAM COMPLETE
Height: 64 in
Weight: 1943.58 [oz_av]

## 2017-07-25 LAB — CK: CK TOTAL: 2691 U/L — AB (ref 38–234)

## 2017-07-25 LAB — PREGNANCY, URINE: PREG TEST UR: NEGATIVE

## 2017-07-25 LAB — URIC ACID: Uric Acid, Serum: 12.9 mg/dL — ABNORMAL HIGH (ref 2.3–6.6)

## 2017-07-25 MED ORDER — ADULT MULTIVITAMIN W/MINERALS CH
1.0000 | ORAL_TABLET | Freq: Every day | ORAL | Status: DC
  Administered 2017-07-27 – 2017-07-28 (×2): 1 via ORAL
  Filled 2017-07-25 (×2): qty 1

## 2017-07-25 MED ORDER — METOPROLOL TARTRATE 5 MG/5ML IV SOLN: 2.5000 mg | INTRAVENOUS | Status: DC | PRN

## 2017-07-25 MED ORDER — LORAZEPAM 2 MG/ML IJ SOLN: 1.0000 mg | INTRAMUSCULAR | Status: DC | PRN

## 2017-07-25 MED ORDER — HYDRALAZINE HCL 20 MG/ML IJ SOLN: 10.0000 mg | INTRAMUSCULAR | Status: DC | PRN

## 2017-07-25 MED ORDER — HALOPERIDOL LACTATE 5 MG/ML IJ SOLN: 2.0000 mg | INTRAMUSCULAR | Status: DC | PRN

## 2017-07-25 MED ORDER — OXYCODONE HCL 5 MG PO TABS
5.0000 mg | ORAL_TABLET | ORAL | Status: DC | PRN
Start: 2017-07-25 — End: 2017-07-28
  Administered 2017-07-27 – 2017-07-28 (×8): 5 mg via ORAL
  Filled 2017-07-25 (×8): qty 1

## 2017-07-25 MED ORDER — LACTATED RINGERS IV SOLN
INTRAVENOUS | Status: DC
  Administered 2017-07-25 – 2017-07-26 (×2): via INTRAVENOUS

## 2017-07-25 MED ORDER — ENSURE ENLIVE PO LIQD
237.0000 mL | Freq: Two times a day (BID) | ORAL | Status: DC
  Administered 2017-07-27 – 2017-07-28 (×3): 237 mL via ORAL

## 2017-07-25 NOTE — Consult Note (Signed)
Urology consult  I was asked by Dr. Sung AmabileSimonds to place a Foley catheter.  CC: Difficult Foley  History of present illness: 36 year old female admitted to the ICU with acute encephalopathy secondary to withdrawal, acute renal failure. Overnight, she had difficulty emptying her bladder. She was catheterized successfully. This morning, a Foley catheter was ordered for with ongoing retention and monitor urine output.  Foley catheter placement was unsuccessful by the nurse.  On exam, Foley catheter was noted to be located within the vagina. This was removed.  She was then prepped using standard sterile conditions. A new 16 French Foley catheter was placed easily through normal urethral meatus. The balloon was filled with 10 cc of sterile water. There was clear return of urine.  Assessment and plan:  1.  Difficult Foley-Foley placed easily. No clear anatomic issues.  2. Urinary retention-multifactorial in the setting of acute encephalopathy and substance abuse. Voiding trial in a few days per primary team.   3. Acute kidney injury- multifactorial. Nephrology following.  Samantha ScotlandAshley Jerremy Maione, MD
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
## 2017-07-25 NOTE — Consult Note (Signed)
[facility] Infectious Disease     Reason for Consult: Endocarditis    Referring Physician: Ferdinand Cava Date of Admission:  07/24/2017   Principal Problem:   Delirium, drug-induced (HCC) Active Problems:   Altered mental status   Opiate abuse, continuous   Amphetamine abuse   Acute renal failure [facility])   HPI: [name] is a 36 y.o. female  admitted September 11 with agitation positive drug screen.  She has a history of HCV, possible previous endocarditis as well as IV drug use.  Tox screen is positive for cocaine, opiates, amphetamine and cannabis.  On admission her white count was 32,000 and she was in acute renal failure with a creatinine of 3.8 up to 5.0.  She also had elevated LFTs and a CK of 2000.  Blood cultures were done and have been negative.  An echocardiogram is neg for vegetatins.  She remains in the intensive care unit.    Past Medical History:  Diagnosis Date  . Acute deep vein thrombosis of arm (HCC) 08/19/2014   Last Assessment & Plan:  - diagnosed 08/2014 in hospital - s/p heparin gtt - patient never took her xarelto x 3 month course that was planned - no symptoms today.  No further treatment - we discussed avoiding IVDU (see below) to reduce her chances of provoked upper extremity DVT   . Asthma   . HCV (hepatitis C virus) 07/17/2013   Last Assessment & Plan:  - positive antibody most recently 08/2014 - hep C RNA 08/2014 was negative - prior to that, HCV RNA [id number] in 06/2013. No record for genotype or imaging.   - patient counseled that she could become re-infected with continued IVDU or unprotected sexual encounters.   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   Last Assessment & Plan:  Discussed routine use of Valtrex prophylaxis at 36 weeks. She will be delivered by cesarean at 36-37 weeks.   . Infection with methicillin-resistant Staphylococcus aureus 03/19/2013   Overview: Arm abscess with MRSA, treated.   Subsequent screening negative  . IV drug abuse   . Tenosynovitis 08/19/2014   Past Surgical History:  Procedure Laterality Date  . CESAREAN SECTION    . CESAREAN SECTION N/A 05/18/2015   Procedure: CESAREAN SECTION;  Surgeon: Hildred Laser, MD;  Location: [facility] ORS;  Service: Obstetrics;  Laterality: N/A;  . NECK SURGERY     Fusion   Social History  Substance Use Topics  . Smoking status: Current Every Day Smoker    Packs/day: 1.00    Years: 7.00    Types: Cigarettes  . Smokeless tobacco: Never Used  . Alcohol use 0.0 oz/week     Comment: bootlegger x2   Family History  Problem Relation Age of Onset  . Diabetes Mother   . COPD Mother   . Hypertension Mother   . Lung cancer Mother   . Alcohol abuse Father   . Lung cancer Maternal Grandmother     Allergies:  Allergies  Allergen Reactions  . Amoxicillin Rash  . Latex   . Vancomycin   . Clindamycin/Lincomycin Rash    Current antibiotics: Antibiotics Given (last 72 hours)    Date/Time Action Medication Dose Rate   07/24/17 1722 New Bag/Given   DAPTOmycin (CUBICIN) 310 mg in sodium chloride 0.9 % IVPB 310 mg 212.4 mL/hr      MEDICATIONS: . feeding supplement (ENSURE ENLIVE)  237 mL Oral BID BM  . heparin  5,000 Units Subcutaneous Q8H  . multivitamin with minerals  1 tablet Oral Daily    Review of Systems - unable to obtain   OBJECTIVE: Temp:  [98.4 F (36.9 C)-99.5 F (37.5 C)] 98.9 F (37.2 C) (09/12 1600) Pulse Rate:  [108-121] 117 (09/12 1800) Resp:  [15-21] 20 (09/12 1800) BP: (125-191)/(68-102) 173/82 (09/12 1919) SpO2:  [96 %-100 %] 99 % (09/12 1800) Physical Exam  Constitutional:  Lethargic, lying in bed, opens eyes but cannot stay awake HENT: [place]/AT, PERRLA, no scleral icterus periorbital ecchymoses  Mouth/Throat: Oropharynx is clear and dry.   Cardiovascular: tachy, 2/6 sm  Pulmonary/Chest: Effort normal and breath sounds normal. No respiratory distress.  has no wheezes.  Neck = supple, no nuchal  rigidity Abdominal: Soft. Bowel sounds are normal.  exhibits no distension. There is no tenderness.  Lymphadenopathy: no cervical adenopathy. No axillary adenopathy Neurological:lethargic Ext - bil Le and UE edema Skin: multiple ecchymoses on face over arms and leg.  Psychiatric: lethargic  LABS: Results for orders placed or performed during the hospital encounter of 07/24/17 (from the past 48 hour(s))  Urine Drug Screen, Qualitative ([place] only)     Status: Abnormal   Collection Time: 07/24/17  1:29 PM  Result Value Ref Range   Tricyclic, Ur Screen NONE DETECTED NONE DETECTED   Amphetamines, Ur Screen POSITIVE (A) NONE DETECTED   MDMA (Ecstasy)Ur Screen NONE DETECTED NONE DETECTED   Cocaine Metabolite,Ur [place] POSITIVE (A) NONE DETECTED   Opiate, Ur Screen POSITIVE (A) NONE DETECTED   Phencyclidine (PCP) Ur S NONE DETECTED NONE DETECTED   Cannabinoid 50 Ng, Ur [place] POSITIVE (A) NONE DETECTED   Barbiturates, Ur Screen NONE DETECTED NONE DETECTED   Benzodiazepine, Ur Scrn NONE DETECTED NONE DETECTED   Methadone Scn, Ur NONE DETECTED NONE DETECTED    Comment: (NOTE) 379  Tricyclics, urine               Cutoff 1000 ng/mL 200  Amphetamines, urine             Cutoff 1000 ng/mL 300  MDMA (Ecstasy), urine           Cutoff 500 ng/mL 400  Cocaine Metabolite, urine       Cutoff 300 ng/mL 500  Opiate, urine                   Cutoff 300 ng/mL 600  Phencyclidine (PCP), urine      Cutoff 25 ng/mL 700  Cannabinoid, urine              Cutoff 50 ng/mL 800  Barbiturates, urine             Cutoff 200 ng/mL 900  Benzodiazepine, urine           Cutoff 200 ng/mL 1000 Methadone, urine                Cutoff 300 ng/mL 1100 1200 The urine drug screen provides only a preliminary, unconfirmed 1300 analytical test result and should not be used for non-medical 1400 purposes. Clinical consideration and professional judgment should 1500 be applied to any positive drug screen result due to possible 1600 interfering  substances. A more specific alternate chemical method 1700 must be used in order to obtain a confirmed analytical result.  1800 Gas chromato graphy / mass spectrometry (GC/MS) is the preferred 1900 confirmatory method.   CBC with Differential/Platelet     Status: Abnormal   Collection Time: 07/24/17  1:31 PM  Result  Value Ref Range   WBC 32.8 (H) 3.6 - 11.0 K/uL   RBC 3.98 3.80 - 5.20 MIL/uL   Hemoglobin 11.9 (L) 12.0 - 16.0 g/dL   HCT 35.2 35.0 - 47.0 %   MCV 88.4 80.0 - 100.0 fL   MCH 29.9 26.0 - 34.0 pg   MCHC 33.8 32.0 - 36.0 g/dL   RDW 15.7 (H) 11.5 - 14.5 %   Platelets 340 150 - 440 K/uL   Neutrophils Relative % 84 %   Lymphocytes Relative 7 %   Monocytes Relative 4 %   Eosinophils Relative 0 %   Basophils Relative 0 %   Band Neutrophils 5 %   Metamyelocytes Relative 0 %   Myelocytes 0 %   Promyelocytes Absolute 0 %   Blasts 0 %   nRBC 0 0 /100 WBC   Other 0 %   Neutro Abs 29.2 (H) 1.4 - 6.5 K/uL   Lymphs Abs 2.3 1.0 - 3.6 K/uL   Monocytes Absolute 1.3 (H) 0.2 - 0.9 K/uL   Eosinophils Absolute 0.0 0 - 0.7 K/uL   Basophils Absolute 0.0 0 - 0.1 K/uL   RBC Morphology MIXED RBC POPULATION   Comprehensive metabolic panel     Status: Abnormal   Collection Time: 07/24/17  1:31 PM  Result Value Ref Range   Sodium 130 (L) 135 - 145 mmol/L   Potassium 4.6 3.5 - 5.1 mmol/L    Comment: HEMOLYSIS AT THIS LEVEL MAY AFFECT RESULT   Chloride 99 (L) 101 - 111 mmol/L   CO2 16 (L) 22 - 32 mmol/L   Glucose, Bld 104 (H) 65 - 99 mg/dL   BUN 67 (H) 6 - 20 mg/dL   Creatinine, Ser 3.80 (H) 0.44 - 1.00 mg/dL   Calcium 8.2 (L) 8.9 - 10.3 mg/dL   Total Protein 8.5 (H) 6.5 - 8.1 g/dL   Albumin 4.3 3.5 - 5.0 g/dL   AST 447 (H) 15 - 41 U/L   ALT 161 (H) 14 - 54 U/L   Alkaline Phosphatase 104 38 - 126 U/L   Total Bilirubin 1.3 (H) 0.3 - 1.2 mg/dL   GFR calc non Af Amer 14 (L) >60 mL/min   GFR calc Af Amer 17 (L) >60 mL/min    Comment: (NOTE) The eGFR has been calculated using the CKD  EPI equation. This calculation has not been validated in all clinical situations. eGFR's persistently <60 mL/min signify possible Chronic Kidney Disease.    Anion gap 15 5 - 15  Troponin I     Status: Abnormal   Collection Time: 07/24/17  1:31 PM  Result Value Ref Range   Troponin I 0.59 (HH) <0.03 ng/mL    Comment: CRITICAL RESULT CALLED TO, READ BACK BY AND VERIFIED WITH AMY TIGGS ON 07/24/17 AT 1415 BY JAG   Ethanol     Status: None   Collection Time: 07/24/17  1:31 PM  Result Value Ref Range   Alcohol, Ethyl (B) <5 <5 mg/dL    Comment:        LOWEST DETECTABLE LIMIT FOR SERUM ALCOHOL IS 5 mg/dL FOR MEDICAL PURPOSES ONLY   Acetaminophen level     Status: Abnormal   Collection Time: 07/24/17  1:31 PM  Result Value Ref Range   Acetaminophen (Tylenol), Serum <10 (L) 10 - 30 ug/mL    Comment:        THERAPEUTIC CONCENTRATIONS VARY SIGNIFICANTLY. A RANGE OF 10-30 ug/mL MAY BE AN EFFECTIVE CONCENTRATION FOR MANY PATIENTS. HOWEVER,  SOME ARE BEST TREATED AT CONCENTRATIONS OUTSIDE THIS RANGE. ACETAMINOPHEN CONCENTRATIONS >150 ug/mL AT 4 HOURS AFTER INGESTION AND >50 ug/mL AT 12 HOURS AFTER INGESTION ARE OFTEN ASSOCIATED WITH TOXIC REACTIONS.   Salicylate level     Status: None   Collection Time: 07/24/17  1:31 PM  Result Value Ref Range   Salicylate Lvl <1.2 2.8 - 30.0 mg/dL  Protime-INR     Status: Abnormal   Collection Time: 07/24/17  1:56 PM  Result Value Ref Range   Prothrombin Time 16.9 (H) 11.4 - 15.2 seconds   INR 1.39   Fibrinogen     Status: None   Collection Time: 07/24/17  1:56 PM  Result Value Ref Range   Fibrinogen 307 210 - 475 mg/dL  Glucose, capillary     Status: Abnormal   Collection Time: 07/24/17  4:51 PM  Result Value Ref Range   Glucose-Capillary 59 (L) 65 - 99 mg/dL  MRSA PCR Screening     Status: None   Collection Time: 07/24/17  5:06 PM  Result Value Ref Range   MRSA by PCR NEGATIVE NEGATIVE    Comment:        The GeneXpert MRSA Assay  (FDA approved for NASAL specimens only), is one component of a comprehensive MRSA colonization surveillance program. It is not intended to diagnose MRSA infection nor to guide or monitor treatment for MRSA infections.   Glucose, capillary     Status: Abnormal   Collection Time: 07/24/17  5:11 PM  Result Value Ref Range   Glucose-Capillary 125 (H) 65 - 99 mg/dL  Blood culture (routine x 2)     Status: None (Preliminary result)   Collection Time: 07/24/17 11:02 PM  Result Value Ref Range   Specimen Description BLOOD BLOOD RIGHT WRIST    Special Requests      BOTTLES DRAWN AEROBIC AND ANAEROBIC Blood Culture adequate volume   Culture NO GROWTH < 12 HOURS    Report Status PENDING   Blood culture (routine x 2)     Status: None (Preliminary result)   Collection Time: 07/24/17 11:02 PM  Result Value Ref Range   Specimen Description BLOOD BLOOD RIGHT FOREARM    Special Requests      BOTTLES DRAWN AEROBIC AND ANAEROBIC Blood Culture results may not be optimal due to an inadequate volume of blood received in culture bottles   Culture NO GROWTH < 12 HOURS    Report Status PENDING   Basic metabolic panel     Status: Abnormal   Collection Time: 07/25/17  3:18 AM  Result Value Ref Range   Sodium 127 (L) 135 - 145 mmol/L   Potassium 4.2 3.5 - 5.1 mmol/L   Chloride 96 (L) 101 - 111 mmol/L   CO2 18 (L) 22 - 32 mmol/L   Glucose, Bld 95 65 - 99 mg/dL   BUN 82 (H) 6 - 20 mg/dL   Creatinine, Ser 5.06 (H) 0.44 - 1.00 mg/dL   Calcium 8.3 (L) 8.9 - 10.3 mg/dL   GFR calc non Af Amer 10 (L) >60 mL/min   GFR calc Af Amer 12 (L) >60 mL/min    Comment: (NOTE) The eGFR has been calculated using the CKD EPI equation. This calculation has not been validated in all clinical situations. eGFR's persistently <60 mL/min signify possible Chronic Kidney Disease.    Anion gap 13 5 - 15  CBC     Status: Abnormal   Collection Time: 07/25/17  3:18 AM  Result Value Ref  Range   WBC 26.6 (H) 3.6 - 11.0 K/uL    RBC 3.62 (L) 3.80 - 5.20 MIL/uL   Hemoglobin 11.1 (L) 12.0 - 16.0 g/dL   HCT 32.2 (L) 35.0 - 47.0 %   MCV 89.2 80.0 - 100.0 fL   MCH 30.8 26.0 - 34.0 pg   MCHC 34.5 32.0 - 36.0 g/dL   RDW 15.9 (H) 11.5 - 14.5 %   Platelets 266 150 - 440 K/uL  CK     Status: Abnormal   Collection Time: 07/25/17 11:22 AM  Result Value Ref Range   Total CK 2,691 (H) 38 - 234 U/L  Uric acid     Status: Abnormal   Collection Time: 07/25/17 11:22 AM  Result Value Ref Range   Uric Acid, Serum 12.9 (H) 2.3 - 6.6 mg/dL  TSH     Status: Abnormal   Collection Time: 07/25/17 11:22 AM  Result Value Ref Range   TSH 0.180 (L) 0.350 - 4.500 uIU/mL    Comment: Performed by a 3rd Generation assay with a functional sensitivity of <=0.01 uIU/mL.  Procalcitonin - Baseline     Status: None   Collection Time: 07/25/17 11:22 AM  Result Value Ref Range   Procalcitonin 84.30 ng/mL    Comment:        Interpretation: PCT >= 10 ng/mL: Important systemic inflammatory response, almost exclusively due to severe bacterial sepsis or septic shock. (NOTE)         ICU PCT Algorithm               Non ICU PCT Algorithm    ----------------------------     ------------------------------         PCT < 0.25 ng/mL                 PCT < 0.1 ng/mL     Stopping of antibiotics            Stopping of antibiotics       strongly encouraged.               strongly encouraged.    ----------------------------     ------------------------------       PCT level decrease by               PCT < 0.25 ng/mL       >= 80% from peak PCT       OR PCT 0.25 - 0.5 ng/mL          Stopping of antibiotics                                             encouraged.     Stopping of antibiotics           encouraged.    ----------------------------     ------------------------------       PCT level decrease by              PCT >= 0.25 ng/mL       < 80% from peak PCT        AND PCT >= 0.5 ng/mL             Continuing antibiotics  encouraged.       Continuing antibiotics            encouraged.    ----------------------------     ------------------------------     PCT level increase compared          PCT > 0.5 ng/mL         with peak PCT AND          PCT >= 0.5 ng/mL             Escalation of antibiotics                                          strongly encouraged.      Escalation of antibiotics        strongly encouraged.   Urinalysis, Complete w Microscopic     Status: Abnormal   Collection Time: 07/25/17 12:58 PM  Result Value Ref Range   Color, Urine YELLOW (A) YELLOW   APPearance CLEAR (A) CLEAR   Specific Gravity, Urine 1.009 1.005 - 1.030   pH 6.0 5.0 - 8.0   Glucose, UA 50 (A) NEGATIVE mg/dL   Hgb urine dipstick LARGE (A) NEGATIVE   Bilirubin Urine NEGATIVE NEGATIVE   Ketones, ur NEGATIVE NEGATIVE mg/dL   Protein, ur 100 (A) NEGATIVE mg/dL   Nitrite NEGATIVE NEGATIVE   Leukocytes, UA NEGATIVE NEGATIVE   RBC / HPF 6-30 0 - 5 RBC/hpf   WBC, UA 0-5 0 - 5 WBC/hpf   Bacteria, UA NONE SEEN NONE SEEN   Squamous Epithelial / LPF 0-5 (A) NONE SEEN   Amorphous Crystal PRESENT   Protein / creatinine ratio, urine     Status: Abnormal   Collection Time: 07/25/17 12:58 PM  Result Value Ref Range   Creatinine, Urine 42 mg/dL   Total Protein, Urine 231 mg/dL    Comment: RESULT CONFIRMED BY MANUAL DILUTION NO NORMAL RANGE ESTABLISHED FOR THIS TEST    Protein Creatinine Ratio 5.50 (H) 0.00 - 0.15 mg/mg[Cre]  Pregnancy, urine     Status: None   Collection Time: 07/25/17  4:24 PM  Result Value Ref Range   Preg Test, Ur NEGATIVE NEGATIVE   No components found for: ESR, C REACTIVE PROTEIN MICRO: Recent Results (from the past 720 hour(s))  MRSA PCR Screening     Status: None   Collection Time: 07/24/17  5:06 PM  Result Value Ref Range Status   MRSA by PCR NEGATIVE NEGATIVE Final    Comment:        The GeneXpert MRSA Assay (FDA approved for NASAL specimens only), is one component of  a comprehensive MRSA colonization surveillance program. It is not intended to diagnose MRSA infection nor to guide or monitor treatment for MRSA infections.   Blood culture (routine x 2)     Status: None (Preliminary result)   Collection Time: 07/24/17 11:02 PM  Result Value Ref Range Status   Specimen Description BLOOD BLOOD RIGHT WRIST  Final   Special Requests   Final    BOTTLES DRAWN AEROBIC AND ANAEROBIC Blood Culture adequate volume   Culture NO GROWTH < 12 HOURS  Final   Report Status PENDING  Incomplete  Blood culture (routine x 2)     Status: None (Preliminary result)   Collection Time: 07/24/17 11:02 PM  Result Value Ref Range Status   Specimen Description BLOOD BLOOD RIGHT FOREARM  Final  Special Requests   Final    BOTTLES DRAWN AEROBIC AND ANAEROBIC Blood Culture results may not be optimal due to an inadequate volume of blood received in culture bottles   Culture NO GROWTH < 12 HOURS  Final   Report Status PENDING  Incomplete    IMAGING: Dg Abd 1 View  Result Date: 07/25/2017 CLINICAL DATA:  Leukocytosis EXAM: ABDOMEN - 1 VIEW COMPARISON:  May 18, 2015 FINDINGS: There is a catheter in or overlying the pelvis. There is no appreciable bowel dilatation or air-fluid level to suggest bowel obstruction. No evident free air. Lung bases are clear. No abnormal calcifications are evident. IMPRESSION: No evident bowel obstruction or free air. Electronically Signed   By: Lowella Grip III M.D.   On: 07/25/2017 17:18   Ct Head Wo Contrast  Result Date: 07/25/2017 CLINICAL DATA:  Acute encephalopathy secondary to opioid withdrawal. EXAM: CT HEAD WITHOUT CONTRAST TECHNIQUE: Contiguous axial images were obtained from the base of the skull through the vertex without intravenous contrast. COMPARISON:  July 16, 2016 FINDINGS: Brain: No evidence of acute infarction, hemorrhage, hydrocephalus, extra-axial collection or mass lesion/mass effect. Vascular: No hyperdense vessel or  unexpected calcification. Skull: Normal. Negative for fracture or focal lesion. Sinuses/Orbits: There is mucoperiosteal thickening of bilateral ethmoid and left frontal sinuses. Other: None. IMPRESSION: No focal acute intracranial abnormality identified. Electronically Signed   By: Abelardo Diesel M.D.   On: 07/25/2017 15:43   Dg Chest Port 1 View  Result Date: 07/25/2017 CLINICAL DATA:  Leukocytosis. EXAM: PORTABLE CHEST 1 VIEW COMPARISON:  July 17, 2016 FINDINGS: Lungs are clear. Heart size and pulmonary vascularity are normal. No adenopathy. There is postoperative change in the lower cervical spine. IMPRESSION: No edema or consolidation. Electronically Signed   By: Lowella Grip III M.D.   On: 07/25/2017 17:17   Echo - Procedure narrative: Transthoracic echocardiography. The study   was technically difficult, as a result of restricted patient   mobility. - Left ventricle: The cavity size was normal. Wall thickness was   increased in a pattern of mild LVH. Systolic function was normal.   The estimated ejection fraction was in the range of 50% to 55%. - Aortic valve: Valve area (Vmax): 2.44 cm^2. - Mitral valve: There was mild regurgitation. - Pulmonic valve: Peak gradient (S): 13 mm Hg.  Assessment:   [name] is a 36 y.o. female admitted September 11 with agitation positive drug screen.  She has a history of HCV, possible previous endocarditis as well as IV drug use.  Tox screen is positive for cocaine, opiates, amphetamine and cannabis.  On admission her white count was 32,000 and she was in acute renal failure with a creatinine of 3.8 up to 5.0.  She also had elevated LFTs and a CK of 2000.  Blood cultures were done and have been negative.  An echocardiogram is neg for vegetatins.  She remains in the intensive care unit. IS tachycardic but not on pressors. CT head, CXR and Abd Xray neg. Renal fxn worsening.  Currently with neg bcx and neg echo no evidence of endocarditis.  Likely multifactorial etiology of her presentation and may have systemic infection so would cover for now but can tailor back treatment if improves.   Recommendations Continue daptomycin  Follow cultures If worsens, wbc increases or procalcitonin increases would add ceftazidime   Thank you very much for allowing me to participate in the care of this patient. Please call with questions.   Cheral Marker. Ola Spurr,  MD

## 2017-07-25 NOTE — Progress Notes (Signed)
*  PRELIMINARY RESULTS* Echocardiogram 2D Echocardiogram has been performed.  Cristela BlueHege, Sharvi Mooneyhan 07/25/2017, 9:31 AM

## 2017-07-25 NOTE — Progress Notes (Signed)
  [facility], Critical Care Medicine Consultation     HPI: 36 yo female with PMH of IV drug use, substance abuse, Hep C, and endocarditis.  [facility]/11 with acute encephalopathy likely secondary to opioid withdrawal and substance abuse and acute renal failure.    Physical Examination:   VS: BP (!) 164/77   Pulse (!) 116   Temp 98.9 F (37.2 C) (Oral)   Resp 20   Ht 5\' 4"  (1.626 m)   Wt 55.1 kg (121 lb 7.6 oz)   SpO2 100%   BMI 20.85 kg/m   General Appearance: acutely ill appearing Caucasian female  Neuro: lethargic, follows commands  HEENT: supple, no JVD   Pulmonary: clear throughout, even, non labored Cardiovascular: s1s2, tachycardia, no M/R/G   Abdomen: Benign, Soft, non-tender. Skin: warm, no rashes, diffuse ecchymosis throughout and ecchymosis present on forehead, scattered abrasions bilateral feet and track marks present bilateral arms and neck  Extremities: 2+ generalized edema upper bilateral extremities, moves all extremities    LABORATORY PANEL:   CBC  Recent Labs Lab 07/25/17 0318  WBC 26.6*  HGB 11.1*  HCT 32.2*  PLT 266   ------------------------------------------------------------------------------------------------------------------  Chemistries   Recent Labs Lab 07/24/17 1331 07/25/17 0318  NA 130* 127*  K 4.6 4.2  CL 99* 96*  CO2 16* 18*  GLUCOSE 104* 95  BUN 67* 82*  CREATININE 3.80* 5.06*  CALCIUM 8.2* 8.3*  AST 447*  --   ALT 161*  --   ALKPHOS 104  --   BILITOT 1.3*  --    ------------------------------------------------------------------------------------------------------------------  Cardiac Enzymes  Recent Labs Lab 07/24/17 1331  TROPONINI 0.59*   ------------------------------------------------------------  RADIOLOGY:  No results found.  ASSESSMENT/PLAN: Acute encephalopathy likely secondary to opioid withdrawal and substance abuse  Acute on chronic renal failure Hyponatremia    Leukocytosis  Transaminitis and elevated CK  Fourth metatarsal fracture  Hx: IV drug abuse and endocarditis  Plan: Supplemental O2 to maintain O2 sats >92% and/or for dyspnea Prn CXR Trend WBC and monitor fever curve Trend PCT Follow cultures Continue abx-- ID consulted Prn ativan for agitation Sitter at bedside for safety at all times for now-pt is currently IVC  Psychiatry and Urology consulted appreciate input  Trend BMP Replace electrolytes as indicated Monitor UOP Continue LR @50  ml/hr Repeat CK in am  Continuous telemetry monitoring  Will need substance abuse cessation counseling once mentation improves  Subq heparin for VTE prophylaxis  Trend CBC Monitor for s/sx of bleeding  CT Head pending    Sonda Rumbleana Blakeney, AGNP  Pulmonary/Critical Care Pager [phone] (please enter 7 digits) PCCM Consult Pager [phone] (please enter 7 digits)  Billy Fischeravid Simonds, MD PCCM service Mobile [phone] Pager [phone] 07/26/2017 3:55 PM
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
## 2017-07-25 NOTE — Progress Notes (Signed)
Bladder scan at 0800 indicated 365 in bladder; pt encouraged to urinate but she continues somnolent and never urinated.  Per Dr Sung AmabileSimonds insert a foley.  Writer and CN unsuccessfu with insertion of 14 Fr cath, urology called, inserted a 16 F urinary cath at 1300, bladder emptied 200 ccs by 1500.  Bladder scan at 1515 indicates >696 ccs in bladder at , despite foley in and draining, albeit slowly.  NP Annabelle Harmanana alerted, she ordered irrigation, foley irrigated by Clinical research associatewriter, no increases in flow, Annabelle HarmanDana alerted, no further orders at this time, monitor only Accompanied pt to CT scan of head, which was negative, patient did open her eyes on command x 1 during this trip.  CT negative, alerted Annabelle HarmanDana to some of the patient's labs and continuing concern re her bladder scan.  She will order KUB and portable chest.

## 2017-07-26 ENCOUNTER — Inpatient Hospital Stay: Payer: Self-pay

## 2017-07-26 DIAGNOSIS — R7989 Other specified abnormal findings of blood chemistry: Secondary | ICD-10-CM

## 2017-07-26 LAB — COMPREHENSIVE METABOLIC PANEL
ALT: 156 U/L — ABNORMAL HIGH (ref 14–54)
AST: 130 U/L — ABNORMAL HIGH (ref 15–41)
Albumin: 3.2 g/dL — ABNORMAL LOW (ref 3.5–5.0)
Alkaline Phosphatase: 83 U/L (ref 38–126)
Anion gap: 12 (ref 5–15)
BUN: 97 mg/dL — AB (ref 6–20)
CALCIUM: 8.4 mg/dL — AB (ref 8.9–10.3)
CHLORIDE: 103 mmol/L (ref 101–111)
CO2: 17 mmol/L — AB (ref 22–32)
CREATININE: 6.21 mg/dL — AB (ref 0.44–1.00)
GFR, EST AFRICAN AMERICAN: 9 mL/min — AB (ref >60)
GFR, EST NON AFRICAN AMERICAN: 8 mL/min — AB (ref >60)
Glucose, Bld: 133 mg/dL — ABNORMAL HIGH (ref 65–99)
Potassium: 3.9 mmol/L (ref 3.5–5.1)
Sodium: 132 mmol/L — ABNORMAL LOW (ref 135–145)
Total Bilirubin: 1 mg/dL (ref 0.3–1.2)
Total Protein: 6.7 g/dL (ref 6.5–8.1)

## 2017-07-26 LAB — C3 COMPLEMENT: C3 Complement: 103 mg/dL (ref 82–167)

## 2017-07-26 LAB — CBC
HCT: 29.4 % — ABNORMAL LOW (ref 35.0–47.0)
HEMOGLOBIN: 10 g/dL — AB (ref 12.0–16.0)
MCH: 29.9 pg (ref 26.0–34.0)
MCHC: 34.1 g/dL (ref 32.0–36.0)
MCV: 87.5 fL (ref 80.0–100.0)
Platelets: 247 10*3/uL (ref 150–440)
RBC: 3.36 MIL/uL — AB (ref 3.80–5.20)
RDW: 16 % — ABNORMAL HIGH (ref 11.5–14.5)
WBC: 20.5 10*3/uL — ABNORMAL HIGH (ref 3.6–11.0)

## 2017-07-26 LAB — CK: Total CK: 717 U/L — ABNORMAL HIGH (ref 38–234)

## 2017-07-26 LAB — HIV ANTIBODY (ROUTINE TESTING W REFLEX): HIV SCREEN 4TH GENERATION: NONREACTIVE

## 2017-07-26 LAB — C4 COMPLEMENT: COMPLEMENT C4, BODY FLUID: 18 mg/dL (ref 14–44)
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
## 2017-07-26 NOTE — Progress Notes (Signed)
Rept given to EMCORMatt RN. Pt resting comfortably on side. Pt easily aroused by calling her name.

## 2017-07-26 NOTE — Progress Notes (Signed)
[employer] - [place] at [facility]   PATIENT NAME: [name]    MR#:  [id number]  DATE OF BIRTH:  16-Jul-1981  SUBJECTIVE:  CHIEF COMPLAINT:   Chief Complaint  Patient presents with  . Addiction Problem  . Psychiatric Evaluation    Came after drug use, withdrawal and agitated.multiple injury marks on body. Never required precedex drip. More calm today, but remains drowsy, easily responding and communicative, does not eat much. Denies any accidents, but said- she fell and had injuries.  REVIEW OF SYSTEMS:  CONSTITUTIONAL: No fever, fatigue or weakness.  EYES: No blurred or double vision.  EARS, NOSE, AND THROAT: No tinnitus or ear pain.  RESPIRATORY: No cough, shortness of breath, wheezing or hemoptysis.  CARDIOVASCULAR: No chest pain, orthopnea, edema.  GASTROINTESTINAL: No nausea, vomiting, diarrhea or abdominal pain.  GENITOURINARY: No dysuria, hematuria.  ENDOCRINE: No polyuria, nocturia,  HEMATOLOGY: No anemia, easy bruising or bleeding SKIN: No rash or lesion. MUSCULOSKELETAL: No joint pain or arthritis.   NEUROLOGIC: No tingling, numbness, weakness.  PSYCHIATRY: No anxiety or depression.   ROS  DRUG ALLERGIES:   Allergies  Allergen Reactions  . Amoxicillin Rash  . Latex   . Vancomycin   . Clindamycin/Lincomycin Rash    VITALS:  Blood pressure (!) 163/87, pulse 99, temperature 97.8 F (36.6 C), temperature source Axillary, resp. rate 18, height  (1.626 m), weight 55.1 kg (121 lb 7.6 oz), SpO2 96 %.  PHYSICAL EXAMINATION:   GENERAL:  36 y.o.-year-old patient lying in the bed , Drowsy EYES: Pupils equal, round, reactive to light. No scleral icterus. Extraocular muscles intact.  HEENT: Head echymosis on forehead, normocephalic. Oropharynx and nasopharynx clear.  NECK:  Supple, no jugular venous distention. No thyroid enlargement, no tenderness.  LUNGS: Normal breath sounds bilaterally, no wheezing, rales,rhonchi or crepitation. No use  of accessory muscles of respiration.  CARDIOVASCULAR: S1, S2 normal. No murmurs, rubs, or gallops.  ABDOMEN: Soft, nontender, nondistended. Bowel sounds present. No organomegaly or mass.  EXTREMITIES: No pedal edema, cyanosis, or clubbing. Multiple injury marks and IV marks. Swelling on both arms. NEUROLOGIC: Cranial nerves intact, moves all 4 limbs, follows commands, power 4/5. PSYCHIATRIC: The patient is alert and very agitated.  SKIN: injury marks , echymosis on forehead, elbows, knee, back, shoulders.  Physical Exam LABORATORY PANEL:   CBC  Recent Labs Lab 07/26/17 0554  WBC 20.5*  HGB 10.0*  HCT 29.4*  PLT 247   ------------------------------------------------------------------------------------------------------------------  Chemistries   Recent Labs Lab 07/26/17 0516  NA 132*  K 3.9  CL 103  CO2 17*  GLUCOSE 133*  BUN 97*  CREATININE 6.21*  CALCIUM 8.4*  AST 130*  ALT 156*  ALKPHOS 83  BILITOT 1.0   ------------------------------------------------------------------------------------------------------------------  Cardiac Enzymes  Recent Labs Lab 07/24/17 1331 07/25/17 1932  TROPONINI 0.59* 0.05*   ------------------------------------------------------------------------------------------------------------------  RADIOLOGY:  Dg Abd 1 View  Result Date: 07/25/2017 CLINICAL DATA:  Leukocytosis EXAM: ABDOMEN - 1 VIEW COMPARISON:  May 18, 2015 FINDINGS: There is a catheter in or overlying the pelvis. There is no appreciable bowel dilatation or air-fluid level to suggest bowel obstruction. No evident free air. Lung bases are clear. No abnormal calcifications are evident. IMPRESSION: No evident bowel obstruction or free air. Electronically Signed   By: Bretta Bang III M.D.   On: 07/25/2017 17:18   Ct Head Wo Contrast  Result Date: 07/25/2017 CLINICAL DATA:  Acute encephalopathy secondary to opioid withdrawal. EXAM: CT HEAD WITHOUT CONTRAST TECHNIQUE:  Contiguous  axial images were obtained from the base of the skull through the vertex without intravenous contrast. COMPARISON:  July 16, 2016 FINDINGS: Brain: No evidence of acute infarction, hemorrhage, hydrocephalus, extra-axial collection or mass lesion/mass effect. Vascular: No hyperdense vessel or unexpected calcification. Skull: Normal. Negative for fracture or focal lesion. Sinuses/Orbits: There is mucoperiosteal thickening of bilateral ethmoid and left frontal sinuses. Other: None. IMPRESSION: No focal acute intracranial abnormality identified. Electronically Signed   By: Sherian ReinWei-Chen  Lin M.D.   On: 07/25/2017 15:43   Dg Chest Port 1 View  Result Date: 07/26/2017 CLINICAL DATA:  Respiratory failure. EXAM: PORTABLE CHEST 1 VIEW COMPARISON:  07/14/2017. FINDINGS: Interval removal of left IJ line. Mediastinum and hilar structures are normal. Lungs are clear. Heart size normal. No prominent pleural effusion. No pneumothorax. Cervical spine fusion. Metallic density noted over the left axilla. A small catheter, possibly of the catheter noted over the portion of the left upper extremity. IMPRESSION: Interval removal of left IJ line.  No acute cardiopulmonary disease. Electronically Signed   By: Maisie Fushomas  Register   On: 07/26/2017 06:22   Dg Chest Port 1 View  Result Date: 07/25/2017 CLINICAL DATA:  Leukocytosis. EXAM: PORTABLE CHEST 1 VIEW COMPARISON:  July 17, 2016 FINDINGS: Lungs are clear. Heart size and pulmonary vascularity are normal. No adenopathy. There is postoperative change in the lower cervical spine. IMPRESSION: No edema or consolidation. Electronically Signed   By: Bretta BangWilliam  Woodruff III M.D.   On: 07/25/2017 17:17    ASSESSMENT AND PLAN:   Principal Problem:   Delirium, drug-induced (HCC) Active Problems:   Altered mental status   Opiate abuse, continuous   Amphetamine abuse   Acute renal failure (HCC)  * Altered mental status due to toxic encephalopathy, drug withdrawal    Multiple IV drug use.   Urine toxicology is positive for cocaine, cannabis, amphetamines.   Ethanol level is low.   IV Ativan , did not require precedex drip.   Monitor in ICU.   Improved now.   Now check for CT head- it could not be done on admission due to agitation.  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   Echocardiogram.  * Acute renal failure   Worsening in renal function, likely due to continued IV drug use, may need to monitor with IV fluids.   Worsening compared to yesterday.   Check CK with multiple bruises.  * Elevated LFTs   Likely due to hepatitis and drug use, monitor.     All the records are reviewed and case discussed with Care Management/Social Workerr. Management plans discussed with the patient, family and they are in agreement.  CODE STATUS: full.  TOTAL TIME TAKING CARE OF THIS PATIENT: 35 minutes.    POSSIBLE D/C IN 1-2 DAYS, DEPENDING ON CLINICAL CONDITION.   Altamese DillingVACHHANI, Cozetta Seif M.D on 07/26/2017   Between 7am to 6pm - Pager - [phone]  After 6pm go to [email or web address] - password [email or web address]  [facility] [place] [facility]  Office  [phone]  CC: Primary care physician; Patient, No Pcp Per  Note: This dictation was prepared with Dragon dictation along with smaller phrase technology. Any transcriptional errors that result from this process are unintentional.

## 2017-07-26 NOTE — Progress Notes (Signed)
[facility] [place], [place] 07/26/17  Subjective:   Moderate today and able to answer a few questions serum creatinine continues to be higher. Has risen from 5.06-6.21 Urine output fortunately, has improved to 1900 cc today Patient has a Foley catheter Denies acute shortness of breath.  No leg edema. Did not eat today due to poor appetite.  Able to drink clear liquids Admits to iv cocaine and heroine  Objective:  Vital signs in last 24 hours:  Temp:  [98.8 F (37.1 C)-99.3 F (37.4 C)] 98.8 F (37.1 C) (09/13 0200) Pulse Rate:  [98-117] 99 (09/13 1200) Resp:  [15-22] 17 (09/13 1200) BP: (136-179)/(67-103) 156/82 (09/13 1200) SpO2:  [95 %-100 %] 96 % (09/13 1200)  Weight change:  Filed Weights   07/24/17 1321 07/24/17 1700  Weight: 52.2 kg (115 lb) 55.1 kg (121 lb 7.6 oz)    Intake/Output:    Intake/Output Summary (Last 24 hours) at 07/26/17 1312 Last data filed at 07/26/17 1200  Gross per 24 hour  Intake             1520 ml  Output             1905 ml  Net             -385 ml   Physical Exam: General:  Pt alert today, laying in the bed  HEENT Pupils constricted with sluggish reaction,  Neck:  no thyromegaly or JVD  Lungs: Coarse breath sounds bilaterally  Heart::  Tachycardic, regular rhythm, systolic murmur  Abdomen: Soft, slightly tender to palpation in LLQ  Extremities: no leg edema  Neurologic: Able to have a conversation and follows commands  Skin: Bruising notable on forehead, left orbit, on arms, and legs. Track marks noted on arms and neck.        Basic Metabolic Panel:   Recent Labs Lab 07/24/17 1331 07/25/17 0318 07/26/17 0516  NA 130* 127* 132*  K 4.6 4.2 3.9  CL 99* 96* 103  CO2 16* 18* 17*  GLUCOSE 104* 95 133*  BUN 67* 82* 97*  CREATININE 3.80* 5.06* 6.21*  CALCIUM 8.2* 8.3* 8.4*     CBC:  Recent Labs Lab 07/24/17 1331 07/25/17 0318 07/26/17 0554  WBC 32.8* 26.6* 20.5*  NEUTROABS 29.2*  --   --   HGB  11.9* 11.1* 10.0*  HCT 35.2 32.2* 29.4*  MCV 88.4 89.2 87.5  PLT 340 266 247     Lab Results  Component Value Date   HEPBSAG Negative 07/19/2016      Microbiology:  Recent Results (from the past 240 hour(s))  MRSA PCR Screening     Status: None   Collection Time: 07/24/17  5:06 PM  Result Value Ref Range Status   MRSA by PCR NEGATIVE NEGATIVE Final    Comment:        The GeneXpert MRSA Assay (FDA approved for NASAL specimens only), is one component of a comprehensive MRSA colonization surveillance program. It is not intended to diagnose MRSA infection nor to guide or monitor treatment for MRSA infections.   Blood culture (routine x 2)     Status: None (Preliminary result)   Collection Time: 07/24/17 11:02 PM  Result Value Ref Range Status   Specimen Description BLOOD BLOOD RIGHT WRIST  Final   Special Requests   Final    BOTTLES DRAWN AEROBIC AND ANAEROBIC Blood Culture adequate volume   Culture NO GROWTH 2 DAYS  Final   Report Status PENDING  Incomplete  Blood culture (routine x 2)     Status: None (Preliminary result)   Collection Time: 07/24/17 11:02 PM  Result Value Ref Range Status   Specimen Description BLOOD BLOOD RIGHT FOREARM  Final   Special Requests   Final    BOTTLES DRAWN AEROBIC AND ANAEROBIC Blood Culture results may not be optimal due to an inadequate volume of blood received in culture bottles   Culture NO GROWTH 2 DAYS  Final   Report Status PENDING  Incomplete    Coagulation Studies:  Recent Labs  07/24/17 1356  LABPROT 16.9*  INR 1.39    Urinalysis:  Recent Labs  07/25/17 1258  COLORURINE YELLOW*  LABSPEC 1.009  PHURINE 6.0  GLUCOSEU 50*  HGBUR LARGE*  BILIRUBINUR NEGATIVE  KETONESUR NEGATIVE  PROTEINUR 100*  NITRITE NEGATIVE  LEUKOCYTESUR NEGATIVE      Imaging: Dg Abd 1 View  Result Date: 07/25/2017 CLINICAL DATA:  Leukocytosis EXAM: ABDOMEN - 1 VIEW COMPARISON:  May 18, 2015 FINDINGS: There is a catheter in or  overlying the pelvis. There is no appreciable bowel dilatation or air-fluid level to suggest bowel obstruction. No evident free air. Lung bases are clear. No abnormal calcifications are evident. IMPRESSION: No evident bowel obstruction or free air. Electronically Signed   By: Bretta Bang III M.D.   On: 07/25/2017 17:18   Ct Head Wo Contrast  Result Date: 07/25/2017 CLINICAL DATA:  Acute encephalopathy secondary to opioid withdrawal. EXAM: CT HEAD WITHOUT CONTRAST TECHNIQUE: Contiguous axial images were obtained from the base of the skull through the vertex without intravenous contrast. COMPARISON:  July 16, 2016 FINDINGS: Brain: No evidence of acute infarction, hemorrhage, hydrocephalus, extra-axial collection or mass lesion/mass effect. Vascular: No hyperdense vessel or unexpected calcification. Skull: Normal. Negative for fracture or focal lesion. Sinuses/Orbits: There is mucoperiosteal thickening of bilateral ethmoid and left frontal sinuses. Other: None. IMPRESSION: No focal acute intracranial abnormality identified. Electronically Signed   By: Sherian Rein M.D.   On: 07/25/2017 15:43   Dg Chest Port 1 View  Result Date: 07/26/2017 CLINICAL DATA:  Respiratory failure. EXAM: PORTABLE CHEST 1 VIEW COMPARISON:  07/14/2017. FINDINGS: Interval removal of left IJ line. Mediastinum and hilar structures are normal. Lungs are clear. Heart size normal. No prominent pleural effusion. No pneumothorax. Cervical spine fusion. Metallic density noted over the left axilla. A small catheter, possibly of the catheter noted over the portion of the left upper extremity. IMPRESSION: Interval removal of left IJ line.  No acute cardiopulmonary disease. Electronically Signed   By: Maisie Fus  Register   On: 07/26/2017 06:22   Dg Chest Port 1 View  Result Date: 07/25/2017 CLINICAL DATA:  Leukocytosis. EXAM: PORTABLE CHEST 1 VIEW COMPARISON:  July 17, 2016 FINDINGS: Lungs are clear. Heart size and pulmonary  vascularity are normal. No adenopathy. There is postoperative change in the lower cervical spine. IMPRESSION: No edema or consolidation. Electronically Signed   By: Bretta Bang III M.D.   On: 07/25/2017 17:17     Medications:   . sodium chloride    . lactated ringers 50 mL/hr at 07/26/17 0805   . feeding supplement (ENSURE ENLIVE)  237 mL Oral BID BM  . heparin  5,000 Units Subcutaneous Q8H  . multivitamin with minerals  1 tablet Oral Daily   docusate sodium, haloperidol lactate, hydrALAZINE, hydrALAZINE, LORazepam, metoprolol tartrate, oxyCODONE  Assessment/ Plan:  36 y.o. female with medical problems of IV drug use, substance abuse, Hep C virus, and previous endocarditis , who was admitted  to [facility] on 07/24/2017 for evaluation of acute encephalopathy with acute delirium, likely secondary to opioid withdrawal and substance abuse.   Acute Renal Failure - Likely ATN and mild rhabdomyolysis - Creatinine increasing from baseline of 1.64 (04/06/17) to 3.80 ->5.06-> 6.21 today - Foley in place for accurate UOP and retention  - Electrolytes and volume status acceptable, no hemodialysis needed at this time. - UOP has improved  Chronic Kidney Disease second to possible HTN and drug abuse - ? Baseline GFR 40   Hyponatremia - Likely second to acute renal failure, will continue to monitor.  - improved    LOS: 2 [facility] 9/13/20181:12 PM  [facility] [place], [place] [phone]

## 2017-07-26 NOTE — Progress Notes (Signed)
[employer] - [place] at [facility]   PATIENT NAME: [name]    MR#:  [id number]  DATE OF BIRTH:  10/08/1981  SUBJECTIVE:  CHIEF COMPLAINT:   Chief Complaint  Patient presents with  . Addiction Problem  . Psychiatric Evaluation    Came after drug use, withdrawal and agitated.multiple injury marks on body. Never required precedex drip. More calm today, but remains drowsy, easily responding and communicative, does not eat much. Denies any accidents, but said- she fell and had injuries.   Calm and alert today.  REVIEW OF SYSTEMS:  CONSTITUTIONAL: No fever, fatigue or weakness.  EYES: No blurred or double vision.  EARS, NOSE, AND THROAT: No tinnitus or ear pain.  RESPIRATORY: No cough, shortness of breath, wheezing or hemoptysis.  CARDIOVASCULAR: No chest pain, orthopnea, edema.  GASTROINTESTINAL: No nausea, vomiting, diarrhea or abdominal pain.  GENITOURINARY: No dysuria, hematuria.  ENDOCRINE: No polyuria, nocturia,  HEMATOLOGY: No anemia, easy bruising or bleeding SKIN: No rash or lesion. MUSCULOSKELETAL: No joint pain or arthritis.   NEUROLOGIC: No tingling, numbness, weakness.  PSYCHIATRY: No anxiety or depression.   ROS  DRUG ALLERGIES:   Allergies  Allergen Reactions  . Amoxicillin Rash  . Latex   . Vancomycin   . Clindamycin/Lincomycin Rash    VITALS:  Blood pressure (!) 163/87, pulse 99, temperature 97.8 F (36.6 C), temperature source Axillary, resp. rate 18, height 5\' 4"  (1.626 m), weight 55.1 kg (121 lb 7.6 oz), SpO2 96 %.  PHYSICAL EXAMINATION:   GENERAL:  36 y.o.-year-old patient lying in the bed , Drowsy EYES: Pupils equal, round, reactive to light. No scleral icterus. Extraocular muscles intact.  HEENT: Head echymosis on forehead, normocephalic. Oropharynx and nasopharynx clear.  NECK:  Supple, no jugular venous distention. No thyroid enlargement, no tenderness.  LUNGS: Normal breath sounds bilaterally, no wheezing, rales,rhonchi  or crepitation. No use of accessory muscles of respiration.  CARDIOVASCULAR: S1, S2 normal. No murmurs, rubs, or gallops.  ABDOMEN: Soft, nontender, nondistended. Bowel sounds present. No organomegaly or mass.  EXTREMITIES: No pedal edema, cyanosis, or clubbing. Multiple injury marks and IV marks. Swelling on both arms. NEUROLOGIC: Cranial nerves intact, moves all 4 limbs, follows commands, power 4/5. PSYCHIATRIC: The patient is alert and completely oriented  SKIN: injury marks , echymosis on forehead, elbows, knee, back, shoulders.  Physical Exam LABORATORY PANEL:   CBC  Recent Labs Lab 07/26/17 0554  WBC 20.5*  HGB 10.0*  HCT 29.4*  PLT 247   ------------------------------------------------------------------------------------------------------------------  Chemistries   Recent Labs Lab 07/26/17 0516  NA 132*  K 3.9  CL 103  CO2 17*  GLUCOSE 133*  BUN 97*  CREATININE 6.21*  CALCIUM 8.4*  AST 130*  ALT 156*  ALKPHOS 83  BILITOT 1.0   ------------------------------------------------------------------------------------------------------------------  Cardiac Enzymes  Recent Labs Lab 07/24/17 1331 07/25/17 1932  TROPONINI 0.59* 0.05*   ------------------------------------------------------------------------------------------------------------------  RADIOLOGY:  Dg Abd 1 View  Result Date: 07/25/2017 CLINICAL DATA:  Leukocytosis EXAM: ABDOMEN - 1 VIEW COMPARISON:  May 18, 2015 FINDINGS: There is a catheter in or overlying the pelvis. There is no appreciable bowel dilatation or air-fluid level to suggest bowel obstruction. No evident free air. Lung bases are clear. No abnormal calcifications are evident. IMPRESSION: No evident bowel obstruction or free air. Electronically Signed   By: Bretta BangWilliam  Woodruff III M.D.   On: 07/25/2017 17:18   Ct Head Wo Contrast  Result Date: 07/25/2017 CLINICAL DATA:  Acute encephalopathy secondary to opioid withdrawal. EXAM: CT  HEAD  WITHOUT CONTRAST TECHNIQUE: Contiguous axial images were obtained from the base of the skull through the vertex without intravenous contrast. COMPARISON:  July 16, 2016 FINDINGS: Brain: No evidence of acute infarction, hemorrhage, hydrocephalus, extra-axial collection or mass lesion/mass effect. Vascular: No hyperdense vessel or unexpected calcification. Skull: Normal. Negative for fracture or focal lesion. Sinuses/Orbits: There is mucoperiosteal thickening of bilateral ethmoid and left frontal sinuses. Other: None. IMPRESSION: No focal acute intracranial abnormality identified. Electronically Signed   By: Sherian Rein M.D.   On: 07/25/2017 15:43   Dg Chest Port 1 View  Result Date: 07/26/2017 CLINICAL DATA:  Respiratory failure. EXAM: PORTABLE CHEST 1 VIEW COMPARISON:  07/14/2017. FINDINGS: Interval removal of left IJ line. Mediastinum and hilar structures are normal. Lungs are clear. Heart size normal. No prominent pleural effusion. No pneumothorax. Cervical spine fusion. Metallic density noted over the left axilla. A small catheter, possibly of the catheter noted over the portion of the left upper extremity. IMPRESSION: Interval removal of left IJ line.  No acute cardiopulmonary disease. Electronically Signed   By: Maisie Fus  Register   On: 07/26/2017 06:22   Dg Chest Port 1 View  Result Date: 07/25/2017 CLINICAL DATA:  Leukocytosis. EXAM: PORTABLE CHEST 1 VIEW COMPARISON:  July 17, 2016 FINDINGS: Lungs are clear. Heart size and pulmonary vascularity are normal. No adenopathy. There is postoperative change in the lower cervical spine. IMPRESSION: No edema or consolidation. Electronically Signed   By: Bretta Bang III M.D.   On: 07/25/2017 17:17    ASSESSMENT AND PLAN:   Principal Problem:   Delirium, drug-induced (HCC) Active Problems:   Altered mental status   Opiate abuse, continuous   Amphetamine abuse   Acute renal failure (HCC)  * Altered mental status due to toxic  encephalopathy, drug withdrawal   Multiple IV drug use.   Urine toxicology is positive for cocaine, cannabis, amphetamines.   Ethanol level is low.   IV Ativan , did not require precedex drip.   Monitor in ICU.   Improved now.   Now check for CT head- negative.  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   Echocardiogram negative.   As per ID, may d/c ABx tomorrow, if bl cx remains negative.  * Rhabdomyolysis   CK is coming down after iv fluids.    * Acute renal failure   Worsening in renal function, likely due to continued IV drug use, may need to monitor with IV fluids.   Worsening compared to yesterday.   Check CK with multiple bruises.  * Elevated LFTs   Likely due to hepatitis and drug use, monitor.   Improving.   All the records are reviewed and case discussed with Care Management/Social Workerr. Management plans discussed with the patient, family and they are in agreement.  CODE STATUS: full.  TOTAL TIME TAKING CARE OF THIS PATIENT: 35 minutes.    POSSIBLE D/C IN 1-2 DAYS, DEPENDING ON CLINICAL CONDITION.   Altamese Dilling M.D on 07/26/2017   Between 7am to 6pm - Pager - [phone]  After 6pm go to [email or web address] - password [email or web address]  [facility] [place] [facility]  Office  [phone]  CC: Primary care physician; Patient, No Pcp Per  Note: This dictation was prepared with Dragon dictation along with smaller phrase technology. Any transcriptional errors that result from this process are unintentional.

## 2017-07-26 NOTE — Progress Notes (Signed)
[facility] CLINIC INFECTIOUS DISEASE PROGRESS NOTE Date of Admission:  07/24/2017     ID: [name] is a 36 y.o. female with IVDU, sepsis Principal Problem:   Delirium, drug-induced (HCC) Active Problems:   Altered mental status   Opiate abuse, continuous   Amphetamine abuse   Acute renal failure (HCC)   Subjective: No fevers, more awake, denies pain. Renal fxn worsening.wbc down to 20, OP seems to be increaseing  ROS  Eleven systems are reviewed and negative except per hpi  Medications:  Antibiotics Given (last 72 hours)    Date/Time Action Medication Dose Rate   07/24/17 1722 New Bag/Given   DAPTOmycin (CUBICIN) 310 mg in sodium chloride 0.9 % IVPB 310 mg 212.4 mL/hr     . feeding supplement (ENSURE ENLIVE)  237 mL Oral BID BM  . heparin  5,000 Units Subcutaneous Q8H  . multivitamin with minerals  1 tablet Oral Daily    Objective: Vital signs in last 24 hours: Temp:  [98.8 F (37.1 C)-99.3 F (37.4 C)] 98.8 F (37.1 C) (09/13 0200) Pulse Rate:  [98-117] 98 (09/13 1000) Resp:  [15-22] 18 (09/13 1000) BP: (136-191)/(67-103) 168/93 (09/13 1000) SpO2:  [95 %-100 %] 96 % (09/13 1000)  . Constitutional:  sleepy, but will arouse and answer questions, lying in bed,  HENT: [place]/AT, PERRLA, no scleral icterus periorbital ecchymoses  Mouth/Throat: Oropharynx is clear and dry.   Cardiovascular: Reg, 2/6 sm  Pulmonary/Chest: Effort normal and breath sounds normal. No respiratory distress.  has no wheezes.  Neck = supple, no nuchal rigidity Abdominal: Soft. Bowel sounds are normal.  exhibits no distension. There is no tenderness.  Lymphadenopathy: no cervical adenopathy. No axillary adenopathy Neurological: awake but sleepy, moves all 4 ext Ext - bil Le and UE edema Skin: multiple ecchymoses on face over arms and leg. Track marks on hand and arms, R foot with some abrasions Psychiatric: lethargic   Lab Results  Recent Labs  07/25/17 0318 07/26/17 0516 07/26/17 0554   WBC 26.6*  --  20.5*  HGB 11.1*  --  10.0*  HCT 32.2*  --  29.4*  NA 127* 132*  --   K 4.2 3.9  --   CL 96* 103  --   CO2 18* 17*  --   BUN 82* 97*  --   CREATININE 5.06* 6.21*  --     Microbiology: @ Studies/Results: Dg Abd 1 View  Result Date: 07/25/2017 CLINICAL DATA:  Leukocytosis EXAM: ABDOMEN - 1 VIEW COMPARISON:  May 18, 2015 FINDINGS: There is a catheter in or overlying the pelvis. There is no appreciable bowel dilatation or air-fluid level to suggest bowel obstruction. No evident free air. Lung bases are clear. No abnormal calcifications are evident. IMPRESSION: No evident bowel obstruction or free air. Electronically Signed   By: Bretta Bang III M.D.   On: 07/25/2017 17:18   Ct Head Wo Contrast  Result Date: 07/25/2017 CLINICAL DATA:  Acute encephalopathy secondary to opioid withdrawal. EXAM: CT HEAD WITHOUT CONTRAST TECHNIQUE: Contiguous axial images were obtained from the base of the skull through the vertex without intravenous contrast. COMPARISON:  July 16, 2016 FINDINGS: Brain: No evidence of acute infarction, hemorrhage, hydrocephalus, extra-axial collection or mass lesion/mass effect. Vascular: No hyperdense vessel or unexpected calcification. Skull: Normal. Negative for fracture or focal lesion. Sinuses/Orbits: There is mucoperiosteal thickening of bilateral ethmoid and left frontal sinuses. Other: None. IMPRESSION: No focal acute intracranial abnormality identified. Electronically Signed   By: Gabriel Carina.D.  On: 07/25/2017 15:43   Dg Chest Port 1 View  Result Date: 07/26/2017 CLINICAL DATA:  Respiratory failure. EXAM: PORTABLE CHEST 1 VIEW COMPARISON:  07/14/2017. FINDINGS: Interval removal of left IJ line. Mediastinum and hilar structures are normal. Lungs are clear. Heart size normal. No prominent pleural effusion. No pneumothorax. Cervical spine fusion. Metallic density noted over the left axilla. A small catheter, possibly of the catheter noted  over the portion of the left upper extremity. IMPRESSION: Interval removal of left IJ line.  No acute cardiopulmonary disease. Electronically Signed   By: Maisie Fushomas  Register   On: 07/26/2017 06:22   Dg Chest Port 1 View  Result Date: 07/25/2017 CLINICAL DATA:  Leukocytosis. EXAM: PORTABLE CHEST 1 VIEW COMPARISON:  July 17, 2016 FINDINGS: Lungs are clear. Heart size and pulmonary vascularity are normal. No adenopathy. There is postoperative change in the lower cervical spine. IMPRESSION: No edema or consolidation. Electronically Signed   By: Bretta BangWilliam  Woodruff III M.D.   On: 07/25/2017 17:17    Assessment/Plan: [name] is a 36 y.o. female admitted September 11 with agitation positive drug screen.  She has a history of HCV, possible previous endocarditis as well as IV drug use.  Tox screen is positive for cocaine, opiates, amphetamine and cannabis.  On admission her white count was 32,000 and she was in acute renal failure with a creatinine of 3.8 up to 5.0.  She also had elevated LFTs and a CK of 2000.  Blood cultures were done and have been negative.  An echocardiogram is neg for vegetations.  She remains in the intensive care unit. Vital signs improving, UOP seems ot be increasing. CT head, CXR and Abd Xray neg. Renal fxn worsening.  Currently with neg bcx and neg echo no evidence of endocarditis. Likely multifactorial etiology of her presentation and may have systemic infection so would cover for now but can tailor back treatment if improves.   Recommendations Continue daptomycin but if bcx neg tomorrow and no other positive culture or fever can dc as no evidence of active infection.  Follow cultures   Thank you very much for the consult. Will follow with you.  Mick SellDavid P Charlett Merkle   07/26/2017, 11:45 AM
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
## 2017-07-26 NOTE — Progress Notes (Signed)
Report called to [place] Lake[place], RN on 1A.

## 2017-07-26 NOTE — Progress Notes (Signed)
Pt. A&O x 3, but lethargic- RASS: -1 to -2, able to follow commands and move all extremities but pt. Has slept all evening without much interaction. VSS, LR continues at 50 mL/h, pt. Has been asking intermittently for soda. UOP: over 1 L O/N. [facility]

## 2017-07-26 NOTE — Progress Notes (Addendum)
Pt admitted to room 152 without incident per MD order. Pt resting comfortably on her L side. Pt arouses easily by calling her name. Resps even and unlabored. No s/sx distress and no c/o such. Pt has bruising and abrasions all over body. Pt has a large amount of purple bruising on forehead. Pt alert and oriented x 3 on admit to the unit. L upper arm IV patent and running LR at 50 cc/hr. No s/sx withdrawal. Prior to admit to room 152, report received from [place] RN in ICU. Pt oriented to room and unit protocols. Bed alarm intact for safety. Will continue to monitor.

## 2017-07-26 NOTE — Progress Notes (Signed)
Arouses to voice and follows commands.  Patient scooted from ICU bed over to floor bed without assistance.  Patient moved to room 152 by bed with no distress noted.
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
## 2017-07-27 LAB — URINALYSIS, COMPLETE (UACMP) WITH MICROSCOPIC
Bilirubin Urine: NEGATIVE
Glucose, UA: 50 mg/dL — AB
Ketones, ur: NEGATIVE mg/dL
NITRITE: NEGATIVE
PROTEIN: 100 mg/dL — AB
SPECIFIC GRAVITY, URINE: 1.006 (ref 1.005–1.030)
pH: 6 (ref 5.0–8.0)

## 2017-07-27 LAB — CBC WITH DIFFERENTIAL/PLATELET
BASOS ABS: 0 10*3/uL (ref 0–0.1)
BASOS PCT: 0 %
EOS ABS: 0.1 10*3/uL (ref 0–0.7)
EOS PCT: 1 %
HCT: 27.3 % — ABNORMAL LOW (ref 35.0–47.0)
Hemoglobin: 9.5 g/dL — ABNORMAL LOW (ref 12.0–16.0)
Lymphocytes Relative: 8 %
Lymphs Abs: 1 10*3/uL (ref 1.0–3.6)
MCH: 30.1 pg (ref 26.0–34.0)
MCHC: 34.9 g/dL (ref 32.0–36.0)
MCV: 86.2 fL (ref 80.0–100.0)
Monocytes Absolute: 0.5 10*3/uL (ref 0.2–0.9)
Monocytes Relative: 4 %
NEUTROS PCT: 87 %
Neutro Abs: 11 10*3/uL — ABNORMAL HIGH (ref 1.4–6.5)
PLATELETS: 201 10*3/uL (ref 150–440)
RBC: 3.16 MIL/uL — AB (ref 3.80–5.20)
RDW: 15.3 % — ABNORMAL HIGH (ref 11.5–14.5)
WBC: 12.7 10*3/uL — ABNORMAL HIGH (ref 3.6–11.0)

## 2017-07-27 LAB — BASIC METABOLIC PANEL
Anion gap: 9 (ref 5–15)
BUN: 81 mg/dL — AB (ref 6–20)
CALCIUM: 8.3 mg/dL — AB (ref 8.9–10.3)
CO2: 21 mmol/L — ABNORMAL LOW (ref 22–32)
CREATININE: 4.81 mg/dL — AB (ref 0.44–1.00)
Chloride: 99 mmol/L — ABNORMAL LOW (ref 101–111)
GFR, EST AFRICAN AMERICAN: 13 mL/min — AB (ref >60)
GFR, EST NON AFRICAN AMERICAN: 11 mL/min — AB (ref >60)
Glucose, Bld: 143 mg/dL — ABNORMAL HIGH (ref 65–99)
Potassium: 4 mmol/L (ref 3.5–5.1)
SODIUM: 129 mmol/L — AB (ref 135–145)

## 2017-07-27 LAB — URINE CULTURE: Culture: 30000 — AB

## 2017-07-27 LAB — ANCA TITERS: C-ANCA: <1:20 {titer}

## 2017-07-27 LAB — COMPLEMENT, TOTAL: Compl, Total (CH50): >60 U/mL (ref >41)

## 2017-07-27 LAB — CK: CK TOTAL: 218 U/L (ref 38–234)

## 2017-07-27 LAB — PROCALCITONIN: PROCALCITONIN: 18.57 ng/mL

## 2017-07-27 LAB — ANA W/REFLEX IF POSITIVE: Anti Nuclear Antibody(ANA): NEGATIVE

## 2017-07-27 MED ORDER — CARVEDILOL 3.125 MG PO TABS
6.2500 mg | ORAL_TABLET | Freq: Two times a day (BID) | ORAL | Status: DC
  Administered 2017-07-27: 6.25 mg via ORAL
  Filled 2017-07-27 (×2): qty 2

## 2017-07-27 MED ORDER — AMLODIPINE BESYLATE 10 MG PO TABS
10.0000 mg | ORAL_TABLET | Freq: Every day | ORAL | Status: DC
  Administered 2017-07-27 – 2017-07-28 (×2): 10 mg via ORAL
  Filled 2017-07-27 (×2): qty 1

## 2017-07-27 NOTE — Progress Notes (Signed)
[facility], [place] 07/27/17  Subjective:   Alert and oriented today and able to answer a few questions Urine output as significantly improved to 2800 cc Patient has a Foley catheter Denies acute shortness of breath.  No leg edema. Able to tolerate liquids without nausea or vomiting  Objective:  Vital signs in last 24 hours:  Temp:  [97.8 F (36.6 C)-99.6 F (37.6 C)] 98.3 F (36.8 C) (09/14 0756) Pulse Rate:  [83-102] 96 (09/14 1147) Resp:  [16-19] 16 (09/14 0756) BP: (157-196)/(82-106) 178/95 (09/14 1147) SpO2:  [95 %-100 %] 98 % (09/14 1147)  Weight change:  Filed Weights   07/24/17 1321 07/24/17 1700  Weight: 52.2 kg (115 lb) 55.1 kg (121 lb 7.6 oz)    Intake/Output:    Intake/Output Summary (Last 24 hours) at 07/27/17 1205 Last data filed at 07/27/17 0943  Gross per 24 hour  Intake              970 ml  Output             1950 ml  Net             -980 ml   Physical Exam: General:  Pt alert today, laying in the bed  HEENT Moist oral mucous membranes  Neck:  no  JVD  Lungs: clear bilaterally  Heart::  Tachycardic, regular rhythm, systolic murmur  Abdomen: Soft, slightly tender to palpation in LLQ  Extremities: no leg edema  Neurologic: Alert and oriented  Skin: Bruising notable on forehead, left orbit, on arms, and legs.        Basic Metabolic Panel:   Recent Labs Lab 07/24/17 1331 07/25/17 0318 07/26/17 0516 07/27/17 1001  NA 130* 127* 132* 129*  K 4.6 4.2 3.9 4.0  CL 99* 96* 103 99*  CO2 16* 18* 17* 21*  GLUCOSE 104* 95 133* 143*  BUN 67* 82* 97* 81*  CREATININE 3.80* 5.06* 6.21* 4.81*  CALCIUM 8.2* 8.3* 8.4* 8.3*     CBC:  Recent Labs Lab 07/24/17 1331 07/25/17 0318 07/26/17 0554 07/27/17 1001  WBC 32.8* 26.6* 20.5* 12.7*  NEUTROABS 29.2*  --   --  11.0*  HGB 11.9* 11.1* 10.0* 9.5*  HCT 35.2 32.2* 29.4* 27.3*  MCV 88.4 89.2 87.5 86.2  PLT 340 266 247 201      Lab Results  Component Value Date    HEPBSAG Negative 07/19/2016      Microbiology:  Recent Results (from the past 240 hour(s))  MRSA PCR Screening     Status: None   Collection Time: 07/24/17  5:06 PM  Result Value Ref Range Status   MRSA by PCR NEGATIVE NEGATIVE Final    Comment:        The GeneXpert MRSA Assay (FDA approved for NASAL specimens only), is one component of a comprehensive MRSA colonization surveillance program. It is not intended to diagnose MRSA infection nor to guide or monitor treatment for MRSA infections.   Blood culture (routine x 2)     Status: None (Preliminary result)   Collection Time: 07/24/17 11:02 PM  Result Value Ref Range Status   Specimen Description BLOOD BLOOD RIGHT WRIST  Final   Special Requests   Final    BOTTLES DRAWN AEROBIC AND ANAEROBIC Blood Culture adequate volume   Culture NO GROWTH 3 DAYS  Final   Report Status PENDING  Incomplete  Blood culture (routine x 2)     Status: None (Preliminary result)   Collection  Time: 07/24/17 11:02 PM  Result Value Ref Range Status   Specimen Description BLOOD BLOOD RIGHT FOREARM  Final   Special Requests   Final    BOTTLES DRAWN AEROBIC AND ANAEROBIC Blood Culture results may not be optimal due to an inadequate volume of blood received in culture bottles   Culture NO GROWTH 3 DAYS  Final   Report Status PENDING  Incomplete  Urine Culture     Status: Abnormal   Collection Time: 07/25/17  4:23 PM  Result Value Ref Range Status   Specimen Description URINE, RANDOM  Final   Special Requests NONE  Final   Culture 30,000 COLONIES/mL ESCHERICHIA COLI (A)  Final   Report Status 07/27/2017 FINAL  Final   Organism ID, Bacteria ESCHERICHIA COLI (A)  Final      Susceptibility   Escherichia coli - MIC*    AMPICILLIN <=2 SENSITIVE Sensitive     CEFAZOLIN <=4 SENSITIVE Sensitive     CEFTRIAXONE <=1 SENSITIVE Sensitive     CIPROFLOXACIN <=0.25 SENSITIVE Sensitive     GENTAMICIN <=1 SENSITIVE Sensitive     IMIPENEM <=0.25 SENSITIVE  Sensitive     NITROFURANTOIN <=16 SENSITIVE Sensitive     TRIMETH/SULFA <=20 SENSITIVE Sensitive     AMPICILLIN/SULBACTAM <=2 SENSITIVE Sensitive     PIP/TAZO <=4 SENSITIVE Sensitive     Extended ESBL NEGATIVE Sensitive     * 30,000 COLONIES/mL ESCHERICHIA COLI    Coagulation Studies:  Recent Labs  07/24/17 1356  LABPROT 16.9*  INR 1.39    Urinalysis:  Recent Labs  07/25/17 1258  COLORURINE YELLOW*  LABSPEC 1.009  PHURINE 6.0  GLUCOSEU 50*  HGBUR LARGE*  BILIRUBINUR NEGATIVE  KETONESUR NEGATIVE  PROTEINUR 100*  NITRITE NEGATIVE  LEUKOCYTESUR NEGATIVE      Imaging: Dg Abd 1 View  Result Date: 07/25/2017 CLINICAL DATA:  Leukocytosis EXAM: ABDOMEN - 1 VIEW COMPARISON:  May 18, 2015 FINDINGS: There is a catheter in or overlying the pelvis. There is no appreciable bowel dilatation or air-fluid level to suggest bowel obstruction. No evident free air. Lung bases are clear. No abnormal calcifications are evident. IMPRESSION: No evident bowel obstruction or free air. Electronically Signed   By: Bretta Bang III M.D.   On: 07/25/2017 17:18   Ct Head Wo Contrast  Result Date: 07/25/2017 CLINICAL DATA:  Acute encephalopathy secondary to opioid withdrawal. EXAM: CT HEAD WITHOUT CONTRAST TECHNIQUE: Contiguous axial images were obtained from the base of the skull through the vertex without intravenous contrast. COMPARISON:  July 16, 2016 FINDINGS: Brain: No evidence of acute infarction, hemorrhage, hydrocephalus, extra-axial collection or mass lesion/mass effect. Vascular: No hyperdense vessel or unexpected calcification. Skull: Normal. Negative for fracture or focal lesion. Sinuses/Orbits: There is mucoperiosteal thickening of bilateral ethmoid and left frontal sinuses. Other: None. IMPRESSION: No focal acute intracranial abnormality identified. Electronically Signed   By: Sherian Rein M.D.   On: 07/25/2017 15:43   Dg Chest Port 1 View  Result Date: 07/26/2017 CLINICAL  DATA:  Respiratory failure. EXAM: PORTABLE CHEST 1 VIEW COMPARISON:  07/14/2017. FINDINGS: Interval removal of left IJ line. Mediastinum and hilar structures are normal. Lungs are clear. Heart size normal. No prominent pleural effusion. No pneumothorax. Cervical spine fusion. Metallic density noted over the left axilla. A small catheter, possibly of the catheter noted over the portion of the left upper extremity. IMPRESSION: Interval removal of left IJ line.  No acute cardiopulmonary disease. Electronically Signed   By: Maisie Fus  Register   On: 07/26/2017  06:22   Dg Chest Port 1 View  Result Date: 07/25/2017 CLINICAL DATA:  Leukocytosis. EXAM: PORTABLE CHEST 1 VIEW COMPARISON:  July 17, 2016 FINDINGS: Lungs are clear. Heart size and pulmonary vascularity are normal. No adenopathy. There is postoperative change in the lower cervical spine. IMPRESSION: No edema or consolidation. Electronically Signed   By: Bretta Bang III M.D.   On: 07/25/2017 17:17     Medications:   . sodium chloride    . lactated ringers 50 mL/hr at 07/26/17 0805   . amLODipine  10 mg Oral Daily  . carvedilol  6.25 mg Oral BID WC  . feeding supplement (ENSURE ENLIVE)  237 mL Oral BID BM  . heparin  5,000 Units Subcutaneous Q8H  . multivitamin with minerals  1 tablet Oral Daily   docusate sodium, haloperidol lactate, hydrALAZINE, hydrALAZINE, LORazepam, metoprolol tartrate, oxyCODONE  Assessment/ Plan:  36 y.o. female with medical problems of IV drug use, substance abuse, Hep C virus, and previous endocarditis , who was admitted to [facility] on 07/24/2017 for evaluation of acute encephalopathy with acute delirium, likely secondary to opioid withdrawal and substance abuse.   Acute Renal Failure - Likely ATN and mild rhabdomyolysis - Creatinine increasing from baseline of 1.64 (04/06/17) to 3.80 ->5.06-> 6.21->4.8 today - may discontinue Foley - Monitor electrolytes during renal recovery  Chronic Kidney Disease-3  possibly due to HTN and drug abuse - ? Baseline GFR 40   Hyponatremia - Likely second to acute renal failure, will continue to monitor.   HTN - Add coreg      LOS: 3 [facility] 9/14/201812:05 PM  [facility] [place], [place] [phone]
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
## 2017-07-27 NOTE — Progress Notes (Signed)
[facility] CLINIC INFECTIOUS DISEASE PROGRESS NOTE Date of Admission:  07/24/2017     ID: [name] is a 36 y.o. female with IVDU, sepsis Principal Problem:   Delirium, drug-induced (HCC) Active Problems:   Altered mental status   Opiate abuse, continuous   Amphetamine abuse   Acute renal failure (HCC)   Subjective: Out of unit, foley dced- reports urinating well. Co some abd cramping. No fevers. ROS  Eleven systems are reviewed and negative except per hpi  Medications:  Antibiotics Given (last 72 hours)    Date/Time Action Medication Dose Rate   07/24/17 1722 New Bag/Given   DAPTOmycin (CUBICIN) 310 mg in sodium chloride 0.9 % IVPB 310 mg 212.4 mL/hr     . amLODipine  10 mg Oral Daily  . carvedilol  6.25 mg Oral BID WC  . feeding supplement (ENSURE ENLIVE)  237 mL Oral BID BM  . heparin  5,000 Units Subcutaneous Q8H  . multivitamin with minerals  1 tablet Oral Daily    Objective: Vital signs in last 24 hours: Temp:  [97.8 F (36.6 C)-99.6 F (37.6 C)] 98.7 F (37.1 C) (09/14 1235) Pulse Rate:  [83-99] 91 (09/14 1235) Resp:  [16-19] 18 (09/14 1235) BP: (157-196)/(82-106) 191/98 (09/14 1235) SpO2:  [96 %-100 %] 100 % (09/14 1235)  . Constitutional:  sleepy, but will arouse and answer questions, lying in bed,  HENT: /AT, PERRLA, no scleral icterus periorbital ecchymoses  Mouth/Throat: Oropharynx is clear and dry.   Cardiovascular: Reg, 2/6 sm  Pulmonary/Chest: Effort normal and breath sounds normal. No respiratory distress.  has no wheezes.  Neck = supple, no nuchal rigidity Abdominal: Soft. Bowel sounds are normal.  exhibits no distension. There is no tenderness.  Lymphadenopathy: no cervical adenopathy. No axillary adenopathy Neurological: awake but sleepy, moves all 4 ext Ext - bil Le and UE edema Skin: multiple ecchymoses on face over arms and leg. Track marks on hand and arms, R foot with some abrasions Psychiatric: lethargic   Lab Results  Recent  Labs  07/26/17 0516 07/26/17 0554 07/27/17 1001  WBC  --  20.5* 12.7*  HGB  --  10.0* 9.5*  HCT  --  29.4* 27.3*  NA 132*  --  129*  K 3.9  --  4.0  CL 103  --  99*  CO2 17*  --  21*  BUN 97*  --  81*  CREATININE 6.21*  --  4.81*    Microbiology: @ Studies/Results: Dg Abd 1 View  Result Date: 07/25/2017 CLINICAL DATA:  Leukocytosis EXAM: ABDOMEN - 1 VIEW COMPARISON:  May 18, 2015 FINDINGS: There is a catheter in or overlying the pelvis. There is no appreciable bowel dilatation or air-fluid level to suggest bowel obstruction. No evident free air. Lung bases are clear. No abnormal calcifications are evident. IMPRESSION: No evident bowel obstruction or free air. Electronically Signed   By: Bretta Bang III M.D.   On: 07/25/2017 17:18   Ct Head Wo Contrast  Result Date: 07/25/2017 CLINICAL DATA:  Acute encephalopathy secondary to opioid withdrawal. EXAM: CT HEAD WITHOUT CONTRAST TECHNIQUE: Contiguous axial images were obtained from the base of the skull through the vertex without intravenous contrast. COMPARISON:  July 16, 2016 FINDINGS: Brain: No evidence of acute infarction, hemorrhage, hydrocephalus, extra-axial collection or mass lesion/mass effect. Vascular: No hyperdense vessel or unexpected calcification. Skull: Normal. Negative for fracture or focal lesion. Sinuses/Orbits: There is mucoperiosteal thickening of bilateral ethmoid and left frontal sinuses. Other: None. IMPRESSION: No focal  acute intracranial abnormality identified. Electronically Signed   By: Sherian Rein M.D.   On: 07/25/2017 15:43   Dg Chest Port 1 View  Result Date: 07/26/2017 CLINICAL DATA:  Respiratory failure. EXAM: PORTABLE CHEST 1 VIEW COMPARISON:  07/14/2017. FINDINGS: Interval removal of left IJ line. Mediastinum and hilar structures are normal. Lungs are clear. Heart size normal. No prominent pleural effusion. No pneumothorax. Cervical spine fusion. Metallic density noted over the left axilla.  A small catheter, possibly of the catheter noted over the portion of the left upper extremity. IMPRESSION: Interval removal of left IJ line.  No acute cardiopulmonary disease. Electronically Signed   By: Maisie Fus  Register   On: 07/26/2017 06:22   Dg Chest Port 1 View  Result Date: 07/25/2017 CLINICAL DATA:  Leukocytosis. EXAM: PORTABLE CHEST 1 VIEW COMPARISON:  July 17, 2016 FINDINGS: Lungs are clear. Heart size and pulmonary vascularity are normal. No adenopathy. There is postoperative change in the lower cervical spine. IMPRESSION: No edema or consolidation. Electronically Signed   By: Bretta Bang III M.D.   On: 07/25/2017 17:17    Assessment/Plan: [name] is a 36 y.o. female admitted September 11 with agitation positive drug screen.  She has a history of HCV, possible previous endocarditis as well as IV drug use.  Tox screen is positive for cocaine, opiates, amphetamine and cannabis.  On admission her white count was 32,000 and she was in acute renal failure with a creatinine of 3.8 up to 5.0.  She also had elevated LFTs and a CK of 2000.  Blood cultures were done and have been negative.  An echocardiogram is neg for vegetations.  She remains in the intensive care unit. Vital signs improving, UOP seems ot be increasing. CT head, CXR and Abd Xray neg. Renal fxn worsening.  Currently with neg bcx and neg echo no evidence of endocarditis. Likely multifactorial etiology of her presentation and may have systemic infection so would cover for now but can tailor back treatment if improves.  WBC down to 12, PC down to 18, CK down to 218 and Cr starting to improve 6.2->4.8 with good UOP UCX 30 K wbc but UA only 0-5 wbc on admit Recommendations Dc dapto and follow off of abx Recheck UA and UCX  Thank you very much for the consult. Will follow with you.  Mick Sell   07/27/2017, 1:28 PM

## 2017-07-27 NOTE — Progress Notes (Signed)
[facility] [place] at [facility]   PATIENT NAME: [name]    MR#:  [id number]  DATE OF BIRTH:  January 11, 1981  SUBJECTIVE:  CHIEF COMPLAINT:   Chief Complaint  Patient presents with  . Addiction Problem  . Psychiatric Evaluation    Came after drug use, withdrawal and agitated.multiple injury marks on body. Never required precedex drip. More calm today, but remains drowsy, easily responding and communicative, does not eat much. Denies any accidents, but said- she fell and had injuries.   Calm and alert today. asking to take out foley cath.  REVIEW OF SYSTEMS:  CONSTITUTIONAL: No fever, fatigue or weakness.  EYES: No blurred or double vision.  EARS, NOSE, AND THROAT: No tinnitus or ear pain.  RESPIRATORY: No cough, shortness of breath, wheezing or hemoptysis.  CARDIOVASCULAR: No chest pain, orthopnea, edema.  GASTROINTESTINAL: No nausea, vomiting, diarrhea or abdominal pain.  GENITOURINARY: No dysuria, hematuria.  ENDOCRINE: No polyuria, nocturia,  HEMATOLOGY: No anemia, easy bruising or bleeding SKIN: No rash or lesion. MUSCULOSKELETAL: No joint pain or arthritis.   NEUROLOGIC: No tingling, numbness, weakness.  PSYCHIATRY: No anxiety or depression.   ROS  DRUG ALLERGIES:   Allergies  Allergen Reactions  . Amoxicillin Rash  . Latex   . Vancomycin   . Clindamycin/Lincomycin Rash    VITALS:  Blood pressure (!) 191/98, pulse 91, temperature 98.7 F (37.1 C), temperature source Oral, resp. rate 18, height  (1.626 m), weight 55.1 kg (121 lb 7.6 oz), SpO2 100 %.  PHYSICAL EXAMINATION:   GENERAL:  36 y.o.-year-old patient lying in the bed , Drowsy EYES: Pupils equal, round, reactive to light. No scleral icterus. Extraocular muscles intact.  HEENT: Head echymosis on forehead, normocephalic. Oropharynx and nasopharynx clear.  NECK:  Supple, no jugular venous distention. No thyroid enlargement, no tenderness.  LUNGS: Normal breath sounds  bilaterally, no wheezing, rales,rhonchi or crepitation. No use of accessory muscles of respiration.  CARDIOVASCULAR: S1, S2 normal. No murmurs, rubs, or gallops.  ABDOMEN: Soft, nontender, nondistended. Bowel sounds present. No organomegaly or mass.  EXTREMITIES: No pedal edema, cyanosis, or clubbing. Multiple injury marks and IV marks. Swelling on both arms. NEUROLOGIC: Cranial nerves intact, moves all 4 limbs, follows commands, power 4/5. PSYCHIATRIC: The patient is alert and completely oriented  SKIN: injury marks , echymosis on forehead, elbows, knee, back, shoulders.  Physical Exam LABORATORY PANEL:   CBC  Recent Labs Lab 07/27/17 1001  WBC 12.7*  HGB 9.5*  HCT 27.3*  PLT 201   ------------------------------------------------------------------------------------------------------------------  Chemistries   Recent Labs Lab 07/26/17 0516 07/27/17 1001  NA 132* 129*  K 3.9 4.0  CL 103 99*  CO2 17* 21*  GLUCOSE 133* 143*  BUN 97* 81*  CREATININE 6.21* 4.81*  CALCIUM 8.4* 8.3*  AST 130*  --   ALT 156*  --   ALKPHOS 83  --   BILITOT 1.0  --    ------------------------------------------------------------------------------------------------------------------  Cardiac Enzymes  Recent Labs Lab 07/24/17 1331 07/25/17 1932  TROPONINI 0.59* 0.05*   ------------------------------------------------------------------------------------------------------------------  RADIOLOGY:  Dg Abd 1 View  Result Date: 07/25/2017 CLINICAL DATA:  Leukocytosis EXAM: ABDOMEN - 1 VIEW COMPARISON:  May 18, 2015 FINDINGS: There is a catheter in or overlying the pelvis. There is no appreciable bowel dilatation or air-fluid level to suggest bowel obstruction. No evident free air. Lung bases are clear. No abnormal calcifications are evident. IMPRESSION: No evident bowel obstruction or free air. Electronically Signed   By: Bretta Bang  III M.D.   On: 07/25/2017 17:18   Dg Chest Port 1  View  Result Date: 07/26/2017 CLINICAL DATA:  Respiratory failure. EXAM: PORTABLE CHEST 1 VIEW COMPARISON:  07/14/2017. FINDINGS: Interval removal of left IJ line. Mediastinum and hilar structures are normal. Lungs are clear. Heart size normal. No prominent pleural effusion. No pneumothorax. Cervical spine fusion. Metallic density noted over the left axilla. A small catheter, possibly of the catheter noted over the portion of the left upper extremity. IMPRESSION: Interval removal of left IJ line.  No acute cardiopulmonary disease. Electronically Signed   By: Maisie Fus  Register   On: 07/26/2017 06:22   Dg Chest Port 1 View  Result Date: 07/25/2017 CLINICAL DATA:  Leukocytosis. EXAM: PORTABLE CHEST 1 VIEW COMPARISON:  July 17, 2016 FINDINGS: Lungs are clear. Heart size and pulmonary vascularity are normal. No adenopathy. There is postoperative change in the lower cervical spine. IMPRESSION: No edema or consolidation. Electronically Signed   By: Bretta Bang III M.D.   On: 07/25/2017 17:17    ASSESSMENT AND PLAN:   Principal Problem:   Delirium, drug-induced (HCC) Active Problems:   Altered mental status   Opiate abuse, continuous   Amphetamine abuse   Acute renal failure (HCC)  * Altered mental status due to toxic encephalopathy, drug withdrawal   Multiple IV drug use.   Urine toxicology is positive for cocaine, cannabis, amphetamines.   Ethanol level is low.   IV Ativan , did not require precedex drip.   Monitor in ICU.   Improved now.   Now check for CT head- negative.  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   Echocardiogram negative.   As per ID, may d/c ABx , as bl cx remains negative.  * Rhabdomyolysis   CK is coming down after iv fluids.    * Acute renal failure   Worsening in renal function, likely due to continued IV drug use, may need to monitor with IV fluids.   Have  good out put, improved some today.  * Elevated LFTs   Likely due to hepatitis and drug use, monitor.   Improving.  * drug abuse   Psych saw the pt- suggested no psych services.  All the records are reviewed and case discussed with Care Management/Social Workerr. Management plans discussed with the patient, family and they are in agreement.  CODE STATUS: full.  TOTAL TIME TAKING CARE OF THIS PATIENT: 35 minutes.    POSSIBLE D/C IN 1-2 DAYS, DEPENDING ON CLINICAL CONDITION.   Altamese Dilling M.D on 07/27/2017   Between 7am to 6pm - Pager - [phone]  After 6pm go to [email or web address] - password [email or web address]  [facility] [place] [facility]  Office  [phone]  CC: Primary care physician; Patient, No Pcp Per  Note: This dictation was prepared with Dragon dictation along with smaller phrase technology. Any transcriptional errors that result from this process are unintentional.

## 2017-07-28 LAB — RENAL FUNCTION PANEL
Albumin: 3.1 g/dL — ABNORMAL LOW (ref 3.5–5.0)
Anion gap: 6 (ref 5–15)
BUN: 62 mg/dL — AB (ref 6–20)
CHLORIDE: 100 mmol/L — AB (ref 101–111)
CO2: 23 mmol/L (ref 22–32)
Calcium: 8.9 mg/dL (ref 8.9–10.3)
Creatinine, Ser: 3.49 mg/dL — ABNORMAL HIGH (ref 0.44–1.00)
GFR, EST AFRICAN AMERICAN: 19 mL/min — AB (ref >60)
GFR, EST NON AFRICAN AMERICAN: 16 mL/min — AB (ref >60)
Glucose, Bld: 129 mg/dL — ABNORMAL HIGH (ref 65–99)
POTASSIUM: 5 mmol/L (ref 3.5–5.1)
Phosphorus: 3.1 mg/dL (ref 2.5–4.6)
Sodium: 129 mmol/L — ABNORMAL LOW (ref 135–145)

## 2017-07-28 LAB — CBC
HEMATOCRIT: 29.1 % — AB (ref 35.0–47.0)
HEMOGLOBIN: 10.4 g/dL — AB (ref 12.0–16.0)
MCH: 31 pg (ref 26.0–34.0)
MCHC: 35.6 g/dL (ref 32.0–36.0)
MCV: 87.2 fL (ref 80.0–100.0)
Platelets: 208 10*3/uL (ref 150–440)
RBC: 3.34 MIL/uL — ABNORMAL LOW (ref 3.80–5.20)
RDW: 15 % — AB (ref 11.5–14.5)
WBC: 12.4 10*3/uL — AB (ref 3.6–11.0)

## 2017-07-28 MED ORDER — ENSURE ENLIVE PO LIQD: 237.0000 mL | Freq: Two times a day (BID) | ORAL | 12 refills | Status: DC

## 2017-07-28 MED ORDER — AMLODIPINE BESYLATE 10 MG PO TABS: 10.0000 mg | ORAL_TABLET | Freq: Every day | ORAL | 0 refills | Status: DC

## 2017-07-28 MED ORDER — CARVEDILOL 12.5 MG PO TABS: 12.5000 mg | ORAL_TABLET | Freq: Two times a day (BID) | ORAL | 0 refills | Status: DC

## 2017-07-28 MED ORDER — HYDRALAZINE HCL 25 MG PO TABS
25.0000 mg | ORAL_TABLET | Freq: Three times a day (TID) | ORAL | Status: DC
  Administered 2017-07-28: 25 mg via ORAL
  Filled 2017-07-28: qty 1

## 2017-07-28 MED ORDER — CARVEDILOL 12.5 MG PO TABS
12.5000 mg | ORAL_TABLET | Freq: Two times a day (BID) | ORAL | Status: DC
  Administered 2017-07-28: 12.5 mg via ORAL
  Filled 2017-07-28: qty 1

## 2017-07-28 MED ORDER — HYDRALAZINE HCL 25 MG PO TABS: 25.0000 mg | ORAL_TABLET | Freq: Three times a day (TID) | ORAL | 0 refills | Status: DC

## 2017-07-28 NOTE — Progress Notes (Addendum)
[employer] [facility] - [place] at [facility]   PATIENT NAME: [name]    MR#:  [id number]  DATE OF BIRTH:  [birth year]  SUBJECTIVE: Patient refused IV access and has no IV fluids right now. She says she doesn't want to be bothered and wants to sleep. Labs from this morning are pending.   CHIEF COMPLAINT:   Chief Complaint  Patient presents with  . Addiction Problem  . Psychiatric Evaluation    Came after drug use, withdrawal and agitated.multiple injury marks on body. Never required precedex drip.     REVIEW OF SYSTEMS:  CONSTITUTIONAL: No fever, fatigue or weakness.  EYES: No blurred or double vision.  EARS, NOSE, AND THROAT: No tinnitus or ear pain.  RESPIRATORY: No cough, shortness of breath, wheezing or hemoptysis.  CARDIOVASCULAR: No chest pain, orthopnea, edema.  GASTROINTESTINAL: No nausea, vomiting, diarrhea or abdominal pain.  GENITOURINARY: No dysuria, hematuria.  ENDOCRINE: No polyuria, nocturia,  HEMATOLOGY: No anemia, easy bruising or bleeding SKIN: No rash or lesion. MUSCULOSKELETAL: No joint pain or arthritis.   NEUROLOGIC: No tingling, numbness, weakness.  PSYCHIATRY: No anxiety or depression.   ROS  DRUG ALLERGIES:   Allergies  Allergen Reactions  . Amoxicillin Rash  . Latex   . Vancomycin   . Clindamycin/Lincomycin Rash    VITALS:  Blood pressure (!) 178/95, pulse 85, temperature 98.6 F (37 C), temperature source Oral, resp. rate 18, height  (1.626 m), weight 55.1 kg (121 lb 7.6 oz), SpO2 99 %.  PHYSICAL EXAMINATION:   GENERAL:  36 y.o.-year-old patient lying in the bed , Drowsy EYES: Pupils equal, round, reactive to light. No scleral icterus. Extraocular muscles intact.  HEENT: Head echymosis on forehead, normocephalic. Oropharynx and nasopharynx clear.  NECK:  Supple, no jugular venous distention. No thyroid enlargement, no tenderness.  LUNGS: Normal breath sounds bilaterally, no wheezing, rales,rhonchi or crepitation. No use  of accessory muscles of respiration.  CARDIOVASCULAR: S1, S2 normal. No murmurs, rubs, or gallops.  ABDOMEN: Soft, nontender, nondistended. Bowel sounds present. No organomegaly or mass.  EXTREMITIES: No pedal edema, cyanosis, or clubbing. Multiple injury marks and IV marks. Swelling on both arms. NEUROLOGIC: Cranial nerves intact, moves all 4 limbs, follows commands, power 4/5. PSYCHIATRIC: The patient is alert and completely oriented  SKIN: injury marks , echymosis on forehead, elbows, knee, back, shoulders.  Physical Exam LABORATORY PANEL:   CBC  Recent Labs Lab 07/27/17 1001  WBC 12.7*  HGB 9.5*  HCT 27.3*  PLT 201   ------------------------------------------------------------------------------------------------------------------  Chemistries   Recent Labs Lab 07/26/17 0516 07/27/17 1001  NA 132* 129*  K 3.9 4.0  CL 103 99*  CO2 17* 21*  GLUCOSE 133* 143*  BUN 97* 81*  CREATININE 6.21* 4.81*  CALCIUM 8.4* 8.3*  AST 130*  --   ALT 156*  --   ALKPHOS 83  --   BILITOT 1.0  --    ------------------------------------------------------------------------------------------------------------------  Cardiac Enzymes  Recent Labs Lab 07/24/17 1331 07/25/17 1932  TROPONINI 0.59* 0.05*   ------------------------------------------------------------------------------------------------------------------  RADIOLOGY:  No results found.  ASSESSMENT AND PLAN:   Principal Problem:   Delirium, drug-induced (HCC) Active Problems:   Altered mental status   Opiate abuse, continuous   Amphetamine abuse   Acute renal failure (HCC)  * Altered mental status due to toxic encephalopathy, drug withdrawal   Multiple IV drug use.   Urine toxicology is positive for cocaine, cannabis, amphetamines.   Ethanol level is low.   IV Ativan , did  not require precedex drip.   Monitor in ICU.   Improved now.   Now check for CT head- negative.  * Elevated white blood cell count    Likely reactive. patient cultures have been negative. Stopped all antibiotics by ID, WBC decreased. 12.7 yesterday.  * Rhabdomyolysis   CK is coming down after iv fluids.    * Acute renal failure, possible chronic kidney disease stage III due to hypertension, drug abuse, baseline GFR is around 40. Patient's creatinine is trending down from 6.21 4.8 yesterday. Creatinine this morning is pending. She lost IV access but refused any IV access, not getting IV fluids since this morning.    Worsening in renal function, likely due to continued IV drug use, may need to monitor with IV fluids.   Have good out put, improved some today.  * Elevated LFTs   Likely due to hepatitis and drug use, monitor.   Improving. LFTs this morning are pending.  Essential hypertension: Controlled. Continue Norvasc 10 mg daily, Coreg 6.1 mg by mouth by twice a day, add hydralazine 25 mg by mouth  4 times a day.  * drug abuse   Psych saw the pt- suggested no psych services.  All the records are reviewed and case discussed with Care Management/Social Workerr. Management plans discussed with the patient, family and they are in agreement.  CODE STATUS: full.  TOTAL TIME TAKING CARE OF THIS PATIENT: 35 minutes.    POSSIBLE D/C IN 1-2 DAYS, DEPENDING ON CLINICAL CONDITION.   Katha Hamming M.D on 07/28/2017   Between 7am to 6pm - Pager - [phone]  After 6pm go to [email or web address] - password [email or web address]  [facility] [place] [facility]  Office  [phone]  CC: Primary care physician; Patient, No Pcp Per  Note: This dictation was prepared with Dragon dictation along with smaller phrase technology. Any transcriptional errors that result from this process are unintentional.

## 2017-07-28 NOTE — Progress Notes (Signed)
[facility], [place] 07/28/17  Subjective:   Alert and oriented today and able to answer a few questions Urine output is good Denies acute shortness of breath.  No leg edema. Able to tolerate diet without nausea or vomiting. Has chic Fil-a packet in room Today's labs are pending Patient reports mid epigastric pain  Objective:  Vital signs in last 24 hours:  Temp:  [98.6 F (37 C)-98.9 F (37.2 C)] 98.9 F (37.2 C) (09/15 0838) Pulse Rate:  [85-105] 86 (09/15 0838) Resp:  [14-18] 14 (09/15 0838) BP: (178-191)/(88-104) 178/104 (09/15 0838) SpO2:  [97 %-100 %] 97 % (09/15 0838)  Weight change:  Filed Weights   07/24/17 1321 07/24/17 1700  Weight: 52.2 kg (115 lb) 55.1 kg (121 lb 7.6 oz)    Intake/Output:   No intake or output data in the 24 hours ending 07/28/17 1217 Physical Exam: General:  Pt alert today, laying in the bed  HEENT Moist oral mucous membranes  Neck:  no  JVD  Lungs: clear bilaterally  Heart::  Tachycardic, regular rhythm, systolic murmur  Abdomen: Soft,  Non distended  Extremities: no leg edema  Neurologic: Alert and oriented  Skin: Bruising notable on forehead, left orbit, on arms, and legs.        Basic Metabolic Panel:   Recent Labs Lab 07/24/17 1331 07/25/17 0318 07/26/17 0516 07/27/17 1001  NA 130* 127* 132* 129*  K 4.6 4.2 3.9 4.0  CL 99* 96* 103 99*  CO2 16* 18* 17* 21*  GLUCOSE 104* 95 133* 143*  BUN 67* 82* 97* 81*  CREATININE 3.80* 5.06* 6.21* 4.81*  CALCIUM 8.2* 8.3* 8.4* 8.3*     CBC:  Recent Labs Lab 07/24/17 1331 07/25/17 0318 07/26/17 0554 07/27/17 1001  WBC 32.8* 26.6* 20.5* 12.7*  NEUTROABS 29.2*  --   --  11.0*  HGB 11.9* 11.1* 10.0* 9.5*  HCT 35.2 32.2* 29.4* 27.3*  MCV 88.4 89.2 87.5 86.2  PLT 340 266 247 201      Lab Results  Component Value Date   HEPBSAG Negative 07/19/2016      Microbiology:  Recent Results (from the past 240 hour(s))  MRSA PCR Screening      Status: None   Collection Time: 07/24/17  5:06 PM  Result Value Ref Range Status   MRSA by PCR NEGATIVE NEGATIVE Final    Comment:        The GeneXpert MRSA Assay (FDA approved for NASAL specimens only), is one component of a comprehensive MRSA colonization surveillance program. It is not intended to diagnose MRSA infection nor to guide or monitor treatment for MRSA infections.   Blood culture (routine x 2)     Status: None (Preliminary result)   Collection Time: 07/24/17 11:02 PM  Result Value Ref Range Status   Specimen Description BLOOD BLOOD RIGHT WRIST  Final   Special Requests   Final    BOTTLES DRAWN AEROBIC AND ANAEROBIC Blood Culture adequate volume   Culture NO GROWTH 4 DAYS  Final   Report Status PENDING  Incomplete  Blood culture (routine x 2)     Status: None (Preliminary result)   Collection Time: 07/24/17 11:02 PM  Result Value Ref Range Status   Specimen Description BLOOD BLOOD RIGHT FOREARM  Final   Special Requests   Final    BOTTLES DRAWN AEROBIC AND ANAEROBIC Blood Culture results may not be optimal due to an inadequate volume of blood received in culture bottles   Culture  NO GROWTH 4 DAYS  Final   Report Status PENDING  Incomplete  Urine Culture     Status: Abnormal   Collection Time: 07/25/17  4:23 PM  Result Value Ref Range Status   Specimen Description URINE, RANDOM  Final   Special Requests NONE  Final   Culture 30,000 COLONIES/mL ESCHERICHIA COLI (A)  Final   Report Status 07/27/2017 FINAL  Final   Organism ID, Bacteria ESCHERICHIA COLI (A)  Final      Susceptibility   Escherichia coli - MIC*    AMPICILLIN <=2 SENSITIVE Sensitive     CEFAZOLIN <=4 SENSITIVE Sensitive     CEFTRIAXONE <=1 SENSITIVE Sensitive     CIPROFLOXACIN <=0.25 SENSITIVE Sensitive     GENTAMICIN <=1 SENSITIVE Sensitive     IMIPENEM <=0.25 SENSITIVE Sensitive     NITROFURANTOIN <=16 SENSITIVE Sensitive     TRIMETH/SULFA <=20 SENSITIVE Sensitive     AMPICILLIN/SULBACTAM <=2  SENSITIVE Sensitive     PIP/TAZO <=4 SENSITIVE Sensitive     Extended ESBL NEGATIVE Sensitive     * 30,000 COLONIES/mL ESCHERICHIA COLI    Coagulation Studies: No results for input(s): LABPROT, INR in the last 72 hours.  Urinalysis:  Recent Labs  07/25/17 1258 07/27/17 1511  COLORURINE YELLOW* YELLOW*  LABSPEC 1.009 1.006  PHURINE 6.0 6.0  GLUCOSEU 50* 50*  HGBUR LARGE* SMALL*  BILIRUBINUR NEGATIVE NEGATIVE  KETONESUR NEGATIVE NEGATIVE  PROTEINUR 100* 100*  NITRITE NEGATIVE NEGATIVE  LEUKOCYTESUR NEGATIVE MODERATE*      Imaging: No results found.   Medications:   . lactated ringers 50 mL/hr at 07/26/17 0805   . amLODipine  10 mg Oral Daily  . carvedilol  12.5 mg Oral BID WC  . feeding supplement (ENSURE ENLIVE)  237 mL Oral BID BM  . heparin  5,000 Units Subcutaneous Q8H  . hydrALAZINE  25 mg Oral Q8H  . multivitamin with minerals  1 tablet Oral Daily   docusate sodium, haloperidol lactate, hydrALAZINE, hydrALAZINE, LORazepam, metoprolol tartrate, oxyCODONE  Assessment/ Plan:  36 y.o. female with medical problems of IV drug use, substance abuse, Hep C virus, and previous endocarditis , who was admitted to [facility] on 07/24/2017 for evaluation of acute encephalopathy with acute delirium, likely secondary to opioid withdrawal and substance abuse.   Acute Renal Failure with proteinuria (5.5 gm) - Likely ATN and mild rhabdomyolysis - Creatinine increasing from baseline of 1.64 (04/06/17) to 3.80 ->5.06-> 6.21->4.8.  Today's results pending - Monitor electrolytes during renal recovery - If serum creatinine improves, may be able to discharge home soon - f/u as outpatient  Chronic Kidney Disease-3 possibly due to HTN and drug abuse - ? Baseline GFR 40   Hyponatremia - Likely second to acute renal failure, will continue to monitor.   HTN - increase dose of coreg - continue amlodipine   Proteinuria - ANA, ANCA neg, complements normal   LOS:  4 [facility] 9/15/201812:17 PM  [facility] [place], [place] [phone]

## 2017-07-28 NOTE — Progress Notes (Signed)
Patient lost IV access. Attempted to start new IV and patient requested that it be started later on in the morning.

## 2017-07-28 NOTE — Progress Notes (Signed)
Nursing staff has been unable to obtain IV access d/t poor veins (IV drug use). Patient has refused any further attempts and has also refused PICC line insertion. Nurse has discussed the importance of continued IV fluids. This nurse sent Dr. Luberta Mutter text to advise. Dr. Luberta Mutter called nurse and said she spoke to Dr. Thedore Mins and since her labs look better it is okay to discharge patient to home. Nurse to give patient Dr. Doristine Church information in order for her to follow up as out patient.

## 2017-07-29 LAB — CULTURE, BLOOD (ROUTINE X 2)
CULTURE: NO GROWTH
CULTURE: NO GROWTH
Special Requests: ADEQUATE

## 2017-07-30 ENCOUNTER — Other Ambulatory Visit
Admission: RE | Admit: 2017-07-30 | Discharge: 2017-07-30 | Disposition: A | Discharge status: Home / Self Care | Payer: Self-pay | Source: Ambulatory Visit | Attending: Nephrology | Admitting: Nephrology

## 2017-07-30 DIAGNOSIS — N179 Acute kidney failure, unspecified: Secondary | ICD-10-CM | POA: Insufficient documentation

## 2017-07-30 LAB — RENAL FUNCTION PANEL
ALBUMIN: 3.2 g/dL — AB (ref 3.5–5.0)
Anion gap: 10 (ref 5–15)
BUN: 37 mg/dL — AB (ref 6–20)
CALCIUM: 8.7 mg/dL — AB (ref 8.9–10.3)
CO2: 22 mmol/L (ref 22–32)
CREATININE: 2.3 mg/dL — AB (ref 0.44–1.00)
Chloride: 103 mmol/L (ref 101–111)
GFR calc Af Amer: 31 mL/min — ABNORMAL LOW (ref >60)
GFR, EST NON AFRICAN AMERICAN: 27 mL/min — AB (ref >60)
GLUCOSE: 136 mg/dL — AB (ref 65–99)
PHOSPHORUS: 4.8 mg/dL — AB (ref 2.5–4.6)
Potassium: 4.2 mmol/L (ref 3.5–5.1)
SODIUM: 135 mmol/L (ref 135–145)

## 2017-07-31 NOTE — Discharge Summary (Signed)
[name], is a 36 y.o. female  DOB 1981/01/28  MRN [id number].  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 Acute deep vein thrombosis of arm (HCC) 08/19/2014   Last Assessment & Plan:  - diagnosed 08/2014 in hospital - s/p heparin gtt - patient never took her xarelto x 3 month course that was planned - no symptoms today.  No further treatment - we discussed avoiding IVDU (see below) to reduce her chances of provoked upper extremity DVT   . Asthma   . HCV (hepatitis C virus) 07/17/2013   Last Assessment & Plan:  - positive antibody most recently 08/2014 - hep C RNA 08/2014 was negative - prior to that, HCV RNA [id number] in 06/2013. No record for genotype or imaging.   - patient counseled that she could become re-infected with continued IVDU or unprotected sexual encounters.   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   Last Assessment & Plan:   Discussed routine use of Valtrex prophylaxis at 36 weeks. She will be delivered by cesarean at 36-37 weeks.   . Infection with methicillin-resistant Staphylococcus aureus 03/19/2013   Overview: Arm abscess with MRSA, treated.  Subsequent screening negative  . IV drug abuse   . Tenosynovitis 08/19/2014    Past Surgical History:  Procedure Laterality Date  . CESAREAN SECTION    . CESAREAN SECTION N/A 05/18/2015   Procedure: CESAREAN SECTION;  Surgeon: Hildred Laser, MD;  Location: [facility] ORS;  Service: Obstetrics;  Laterality: N/A;  . NECK SURGERY     Fusion       History of present illness and  Hospital Course:     Kindly see H&P for history of present illness and admission details, please review complete Labs, Consult reports and Test reports for all details in brief  HPI  from the history and physical done on the day of admission 36 year old female patient admitted for hypotension problem, psychiatric evaluation. Patient had history of IV drug abuse, hepatitis C, previous endocarditis. Initially came and she was found to have a erratic behavior, found have acute renal failure, leukocytosis with WBC 32,000. so she is admitted to medicine.   Hospital Course  #1. Altered mental status secondary to toxic encephalopathy, drug withdrawal: Patient uses multiple highly trucks. Patient received IV Ativan. Cause of severe agitation, history of active drug abuse admitted to intensive care unit, for close monitoring.not required Precedex drip.  #2 history of heavy drug abuse, seen by psychiatry.made IVC by psych. folowed by Dr.Clapacs;, IVs discontinued, patient is given options for community services for substance abuse including RT.    #3 history of IV  drug abuse, substance abuse, hepatitis C, endocarditis: On admission WBC 32. There was concern for endocarditis. Patient is seen by Dr. Sampson Goon, blood cultures, urine cultures have been negative, echocardiogram is also negative for vegetations.  X-ray did not show pneumonia. Head CT is unremarkable. Received daptomycin. Her antibiotics after cultures have been negative. WBC decreased to 12.7.  #4 acute renal failure: Seen by nephrology. Patient creatinine as high as 6.21, baseline creatinine around 1.6. Patient received IV fluids, monitor the urine output with Foley initially, patient creatinine nicely improved with IV fluids and also urine output also improved, creatinine trended down, creatinine is 3.49at the time of discharge. Discharge home after discussing with nephrology, patient has no insurance but nephrologist will follow in the office for follow-up of kidney labs. #Hyponatremia secondary to dehydration: Improved. #6 elevated LFTs AST 447, ALT 161 on admission, decreased to AST 1:30, ALT 156.  7, essential hypertension: Controlled, continue Norvasc, Coreg, hydralazine   Discharge Condition: Stable   Follow UP      Discharge Instructions  and  Discharge Medications     Allergies as of 07/28/2017      Reactions   Amoxicillin Rash   Latex    Vancomycin    Clindamycin/lincomycin Rash      Medication List    TAKE these medications   amLODipine 10 MG tablet Commonly known as:  NORVASC Take 1 tablet (10 mg total) by mouth daily.   carvedilol 12.5 MG tablet Commonly known as:  COREG Take 1 tablet (12.5 mg total) by mouth 2 (two) times daily with a meal.   feeding supplement (ENSURE ENLIVE) Liqd Take 237 mLs by mouth 2 (two) times daily between meals.   hydrALAZINE 25 MG tablet Commonly known as:  APRESOLINE Take 1 tablet (25 mg total) by mouth every 8 (eight) hours.            Discharge Care Instructions        Start     Ordered   07/29/17 0000  amLODipine (NORVASC) 10 MG tablet  Daily     07/28/17 1302   07/28/17 0000  carvedilol (COREG) 12.5 MG tablet  2 times daily with meals     07/28/17 1302   07/28/17 0000  feeding supplement, ENSURE ENLIVE, (ENSURE ENLIVE) LIQD  2 times daily between meals      07/28/17 1302   07/28/17 0000  hydrALAZINE (APRESOLINE) 25 MG tablet  Every 8 hours     07/28/17 1302        Diet and Activity recommendation: See Discharge Instructions above   Consults obtained -Nephrology, psychiatry   Major procedures and Radiology Reports - PLEASE review detailed and final reports for all details, in brief -      Dg Abd 1 View  Result Date: 07/25/2017 CLINICAL DATA:  Leukocytosis EXAM: ABDOMEN - 1 VIEW COMPARISON:  May 18, 2015 FINDINGS: There is a catheter in or overlying the pelvis. There is no appreciable bowel dilatation or air-fluid level to suggest bowel obstruction. No evident free air. Lung bases are clear. No abnormal calcifications are evident. IMPRESSION: No evident bowel obstruction or free air. Electronically Signed   By: Bretta Bang III M.D.   On: 07/25/2017 17:18   Ct Head Wo Contrast  Result Date: 07/25/2017 CLINICAL DATA:  Acute encephalopathy secondary to opioid withdrawal. EXAM: CT HEAD WITHOUT CONTRAST TECHNIQUE: Contiguous axial images were obtained from the base of the skull through the vertex without intravenous contrast. COMPARISON:  July 16, 2016 FINDINGS: Brain:  No evidence of acute infarction, hemorrhage, hydrocephalus, extra-axial collection or mass lesion/mass effect. Vascular: No hyperdense vessel or unexpected calcification. Skull: Normal. Negative for fracture or focal lesion. Sinuses/Orbits: There is mucoperiosteal thickening of bilateral ethmoid and left frontal sinuses. Other: None. IMPRESSION: No focal acute intracranial abnormality identified. Electronically Signed   By: Sherian Rein M.D.   On: 07/25/2017 15:43   Dg Chest Port 1 View  Result Date: 07/26/2017 CLINICAL DATA:  Respiratory failure. EXAM: PORTABLE CHEST 1 VIEW COMPARISON:  07/14/2017. FINDINGS: Interval removal of left IJ line. Mediastinum and hilar structures are normal. Lungs are clear. Heart size normal. No prominent pleural effusion. No  pneumothorax. Cervical spine fusion. Metallic density noted over the left axilla. A small catheter, possibly of the catheter noted over the portion of the left upper extremity. IMPRESSION: Interval removal of left IJ line.  No acute cardiopulmonary disease. Electronically Signed   By: Maisie Fus  Register   On: 07/26/2017 06:22   Dg Chest Port 1 View  Result Date: 07/25/2017 CLINICAL DATA:  Leukocytosis. EXAM: PORTABLE CHEST 1 VIEW COMPARISON:  July 17, 2016 FINDINGS: Lungs are clear. Heart size and pulmonary vascularity are normal. No adenopathy. There is postoperative change in the lower cervical spine. IMPRESSION: No edema or consolidation. Electronically Signed   By: Bretta Bang III M.D.   On: 07/25/2017 17:17    Micro Results    Recent Results (from the past 240 hour(s))  MRSA PCR Screening     Status: None   Collection Time: 07/24/17  5:06 PM  Result Value Ref Range Status   MRSA by PCR NEGATIVE NEGATIVE Final    Comment:        The GeneXpert MRSA Assay (FDA approved for NASAL specimens only), is one component of a comprehensive MRSA colonization surveillance program. It is not intended to diagnose MRSA infection nor to guide or monitor treatment for MRSA infections.   Blood culture (routine x 2)     Status: None   Collection Time: 07/24/17 11:02 PM  Result Value Ref Range Status   Specimen Description BLOOD BLOOD RIGHT WRIST  Final   Special Requests   Final    BOTTLES DRAWN AEROBIC AND ANAEROBIC Blood Culture adequate volume   Culture NO GROWTH 5 DAYS  Final   Report Status 07/29/2017 FINAL  Final  Blood culture (routine x 2)     Status: None   Collection Time: 07/24/17 11:02 PM  Result Value Ref Range Status   Specimen Description BLOOD BLOOD RIGHT FOREARM  Final   Special Requests   Final    BOTTLES DRAWN AEROBIC AND ANAEROBIC Blood Culture results may not be optimal due to an inadequate volume of blood received in culture bottles   Culture NO GROWTH 5 DAYS   Final   Report Status 07/29/2017 FINAL  Final  Urine Culture     Status: Abnormal   Collection Time: 07/25/17  4:23 PM  Result Value Ref Range Status   Specimen Description URINE, RANDOM  Final   Special Requests NONE  Final   Culture 30,000 COLONIES/mL ESCHERICHIA COLI (A)  Final   Report Status 07/27/2017 FINAL  Final   Organism ID, Bacteria ESCHERICHIA COLI (A)  Final      Susceptibility   Escherichia coli - MIC*    AMPICILLIN <=2 SENSITIVE Sensitive     CEFAZOLIN <=4 SENSITIVE Sensitive     CEFTRIAXONE <=1 SENSITIVE Sensitive     CIPROFLOXACIN <=0.25 SENSITIVE Sensitive     GENTAMICIN <=1 SENSITIVE  Sensitive     IMIPENEM <=0.25 SENSITIVE Sensitive     NITROFURANTOIN <=16 SENSITIVE Sensitive     TRIMETH/SULFA <=20 SENSITIVE Sensitive     AMPICILLIN/SULBACTAM <=2 SENSITIVE Sensitive     PIP/TAZO <=4 SENSITIVE Sensitive     Extended ESBL NEGATIVE Sensitive     * 30,000 COLONIES/mL ESCHERICHIA COLI       Today   Subjective:   [name] today has no headache,no chest abdominal pain,no new weakness tingling or numbness, feels much better wants to go home today.   Objective:   Blood pressure (!) 178/104, pulse 86, temperature 98.9 F (37.2 C), temperature source Oral, resp. rate 14, height  (1.626 m), weight 55.1 kg (121 lb 7.6 oz), SpO2 97 %.  No intake or output data in the 24 hours ending 07/31/17 1030  Exam Awake Alert, Oriented x 3, No new F.N deficits, Normal affect [place].AT,PERRAL Supple Neck,No JVD, No cervical lymphadenopathy appriciated.  Symmetrical Chest wall movement, Good air movement bilaterally, CTAB RRR,No Gallops,Rubs or new Murmurs, No Parasternal Heave +ve B.Sounds, Abd Soft, Non tender, No organomegaly appriciated, No rebound -guarding or rigidity. No Cyanosis, Clubbing or edema, No new Rash or bruise  Data Review   CBC w Diff:  Lab Results  Component Value Date   WBC 12.4 (H) 07/28/2017   HGB 10.4 (L) 07/28/2017   HGB 8.0 (L)  05/13/2015   HCT 29.1 (L) 07/28/2017   HCT 24.6 (L) 05/13/2015   PLT 208 07/28/2017   PLT 257 12/14/2014   LYMPHOPCT 8 07/27/2017   LYMPHOPCT 19.8 12/14/2014   BANDSPCT 5 07/24/2017   MONOPCT 4 07/27/2017   MONOPCT 3.8 12/14/2014   EOSPCT 1 07/27/2017   EOSPCT 1.9 12/14/2014   BASOPCT 0 07/27/2017   BASOPCT 0.3 12/14/2014    CMP:  Lab Results  Component Value Date   NA 135 07/30/2017   NA 134 (L) 12/14/2014   K 4.2 07/30/2017   K 3.6 12/14/2014   CL 103 07/30/2017   CL 105 12/14/2014   CO2 22 07/30/2017   CO2 22 12/14/2014   BUN 37 (H) 07/30/2017   BUN 8 12/14/2014   CREATININE 2.30 (H) 07/30/2017   CREATININE 0.68 12/14/2014   PROT 6.7 07/26/2017   PROT 8.0 12/14/2014   ALBUMIN 3.2 (L) 07/30/2017   ALBUMIN 3.6 12/14/2014   BILITOT 1.0 07/26/2017   BILITOT 0.4 12/14/2014   ALKPHOS 83 07/26/2017   ALKPHOS 68 12/14/2014   AST 130 (H) 07/26/2017   AST 16 12/14/2014   ALT 156 (H) 07/26/2017   ALT 17 12/14/2014  .   Total Time in preparing paper work, data evaluation and todays exam - 35 minutes  Johany Hansman M.D on 07/28/2017 at 10:30 AM    Note: This dictation was prepared with Dragon dictation along with smaller phrase technology. Any transcriptional errors that result from this process are unintentional.

## 2017-08-15 ENCOUNTER — Encounter: Payer: Self-pay | Admitting: Emergency Medicine

## 2017-08-15 ENCOUNTER — Emergency Department: Payer: Self-pay

## 2017-08-15 ENCOUNTER — Inpatient Hospital Stay
Admission: EM | Admit: 2017-08-15 | Discharge: 2017-08-18 | DRG: 871 | Disposition: A | Discharge status: Home / Self Care | Payer: Self-pay | Attending: Internal Medicine | Admitting: Internal Medicine

## 2017-08-15 DIAGNOSIS — F17210 Nicotine dependence, cigarettes, uncomplicated: Secondary | ICD-10-CM | POA: Diagnosis present

## 2017-08-15 DIAGNOSIS — E872 Acidosis: Secondary | ICD-10-CM | POA: Diagnosis present

## 2017-08-15 DIAGNOSIS — Y95 Nosocomial condition: Secondary | ICD-10-CM | POA: Diagnosis present

## 2017-08-15 DIAGNOSIS — D649 Anemia, unspecified: Secondary | ICD-10-CM | POA: Diagnosis present

## 2017-08-15 DIAGNOSIS — T50901A Poisoning by unspecified drugs, medicaments and biological substances, accidental (unintentional), initial encounter: Secondary | ICD-10-CM

## 2017-08-15 DIAGNOSIS — R6520 Severe sepsis without septic shock: Secondary | ICD-10-CM

## 2017-08-15 DIAGNOSIS — F19921 Other psychoactive substance use, unspecified with intoxication with delirium: Secondary | ICD-10-CM

## 2017-08-15 DIAGNOSIS — Z881 Allergy status to other antibiotic agents status: Secondary | ICD-10-CM

## 2017-08-15 DIAGNOSIS — R011 Cardiac murmur, unspecified: Secondary | ICD-10-CM | POA: Diagnosis present

## 2017-08-15 DIAGNOSIS — R45851 Suicidal ideations: Secondary | ICD-10-CM | POA: Diagnosis present

## 2017-08-15 DIAGNOSIS — Z91040 Latex allergy status: Secondary | ICD-10-CM

## 2017-08-15 DIAGNOSIS — J9601 Acute respiratory failure with hypoxia: Secondary | ICD-10-CM | POA: Diagnosis present

## 2017-08-15 DIAGNOSIS — F329 Major depressive disorder, single episode, unspecified: Secondary | ICD-10-CM | POA: Diagnosis present

## 2017-08-15 DIAGNOSIS — Z8619 Personal history of other infectious and parasitic diseases: Secondary | ICD-10-CM

## 2017-08-15 DIAGNOSIS — J189 Pneumonia, unspecified organism: Secondary | ICD-10-CM | POA: Diagnosis present

## 2017-08-15 DIAGNOSIS — I340 Nonrheumatic mitral (valve) insufficiency: Secondary | ICD-10-CM | POA: Diagnosis present

## 2017-08-15 DIAGNOSIS — R509 Fever, unspecified: Secondary | ICD-10-CM

## 2017-08-15 DIAGNOSIS — Z825 Family history of asthma and other chronic lower respiratory diseases: Secondary | ICD-10-CM

## 2017-08-15 DIAGNOSIS — E860 Dehydration: Secondary | ICD-10-CM | POA: Diagnosis present

## 2017-08-15 DIAGNOSIS — F1910 Other psychoactive substance abuse, uncomplicated: Secondary | ICD-10-CM

## 2017-08-15 DIAGNOSIS — J9602 Acute respiratory failure with hypercapnia: Secondary | ICD-10-CM | POA: Diagnosis present

## 2017-08-15 DIAGNOSIS — F1110 Opioid abuse, uncomplicated: Secondary | ICD-10-CM | POA: Diagnosis present

## 2017-08-15 DIAGNOSIS — N179 Acute kidney failure, unspecified: Secondary | ICD-10-CM | POA: Diagnosis present

## 2017-08-15 DIAGNOSIS — Z981 Arthrodesis status: Secondary | ICD-10-CM

## 2017-08-15 DIAGNOSIS — Z833 Family history of diabetes mellitus: Secondary | ICD-10-CM

## 2017-08-15 DIAGNOSIS — Z801 Family history of malignant neoplasm of trachea, bronchus and lung: Secondary | ICD-10-CM

## 2017-08-15 DIAGNOSIS — T50905A Adverse effect of unspecified drugs, medicaments and biological substances, initial encounter: Secondary | ICD-10-CM | POA: Diagnosis present

## 2017-08-15 DIAGNOSIS — Z0189 Encounter for other specified special examinations: Secondary | ICD-10-CM

## 2017-08-15 DIAGNOSIS — A419 Sepsis, unspecified organism: Principal | ICD-10-CM | POA: Diagnosis present

## 2017-08-15 DIAGNOSIS — R401 Stupor: Secondary | ICD-10-CM | POA: Diagnosis present

## 2017-08-15 DIAGNOSIS — Z86718 Personal history of other venous thrombosis and embolism: Secondary | ICD-10-CM

## 2017-08-15 DIAGNOSIS — Z811 Family history of alcohol abuse and dependence: Secondary | ICD-10-CM

## 2017-08-15 DIAGNOSIS — T50904A Poisoning by unspecified drugs, medicaments and biological substances, undetermined, initial encounter: Secondary | ICD-10-CM | POA: Diagnosis present

## 2017-08-15 DIAGNOSIS — M6282 Rhabdomyolysis: Secondary | ICD-10-CM | POA: Diagnosis present

## 2017-08-15 DIAGNOSIS — R6521 Severe sepsis with septic shock: Secondary | ICD-10-CM | POA: Diagnosis present

## 2017-08-15 DIAGNOSIS — G92 Toxic encephalopathy: Secondary | ICD-10-CM | POA: Diagnosis present

## 2017-08-15 DIAGNOSIS — F1410 Cocaine abuse, uncomplicated: Secondary | ICD-10-CM | POA: Diagnosis present

## 2017-08-15 DIAGNOSIS — Z8249 Family history of ischemic heart disease and other diseases of the circulatory system: Secondary | ICD-10-CM

## 2017-08-15 LAB — URINALYSIS, COMPLETE (UACMP) WITH MICROSCOPIC
BACTERIA UA: NONE SEEN
BILIRUBIN URINE: NEGATIVE
GLUCOSE, UA: NEGATIVE mg/dL
HGB URINE DIPSTICK: NEGATIVE
Ketones, ur: NEGATIVE mg/dL
LEUKOCYTES UA: NEGATIVE
NITRITE: NEGATIVE
Protein, ur: 30 mg/dL — AB
SPECIFIC GRAVITY, URINE: 1.015 (ref 1.005–1.030)
Squamous Epithelial / LPF: NONE SEEN
pH: 5 (ref 5.0–8.0)

## 2017-08-15 LAB — CBC WITH DIFFERENTIAL/PLATELET
BASOS ABS: 0.1 10*3/uL (ref 0–0.1)
BASOS PCT: 1 %
EOS ABS: 0.6 10*3/uL (ref 0–0.7)
Eosinophils Relative: 4 %
HEMATOCRIT: 25.8 % — AB (ref 35.0–47.0)
HEMOGLOBIN: 8.7 g/dL — AB (ref 12.0–16.0)
Lymphocytes Relative: 25 %
Lymphs Abs: 3.8 10*3/uL — ABNORMAL HIGH (ref 1.0–3.6)
MCH: 30.8 pg (ref 26.0–34.0)
MCHC: 33.9 g/dL (ref 32.0–36.0)
MCV: 91.1 fL (ref 80.0–100.0)
MONOS PCT: 8 %
Monocytes Absolute: 1.3 10*3/uL — ABNORMAL HIGH (ref 0.2–0.9)
NEUTROS ABS: 9.5 10*3/uL — AB (ref 1.4–6.5)
NEUTROS PCT: 62 %
Platelets: 294 10*3/uL (ref 150–440)
RBC: 2.83 MIL/uL — AB (ref 3.80–5.20)
RDW: 15.6 % — ABNORMAL HIGH (ref 11.5–14.5)
WBC: 15.3 10*3/uL — AB (ref 3.6–11.0)

## 2017-08-15 LAB — URINE DRUG SCREEN, QUALITATIVE (ARMC ONLY)
Amphetamines, Ur Screen: POSITIVE — AB
Barbiturates, Ur Screen: NOT DETECTED
Benzodiazepine, Ur Scrn: POSITIVE — AB
CANNABINOID 50 NG, UR ~~LOC~~: POSITIVE — AB
COCAINE METABOLITE, UR ~~LOC~~: POSITIVE — AB
MDMA (ECSTASY) UR SCREEN: NOT DETECTED
Methadone Scn, Ur: NOT DETECTED
Opiate, Ur Screen: POSITIVE — AB
PHENCYCLIDINE (PCP) UR S: NOT DETECTED
Tricyclic, Ur Screen: NOT DETECTED

## 2017-08-15 LAB — BLOOD GAS, VENOUS
ACID-BASE DEFICIT: 6.5 mmol/L — AB (ref 0.0–2.0)
Bicarbonate: 20.2 mmol/L (ref 20.0–28.0)
O2 SAT: 99.9 %
PATIENT TEMPERATURE: 37
pCO2, Ven: 45 mmHg (ref 44.0–60.0)
pH, Ven: 7.26 (ref 7.250–7.430)
pO2, Ven: 384 mmHg — ABNORMAL HIGH (ref 32.0–45.0)

## 2017-08-15 LAB — COMPREHENSIVE METABOLIC PANEL
ALBUMIN: 4.1 g/dL (ref 3.5–5.0)
ALK PHOS: 64 U/L (ref 38–126)
ALT: 18 U/L (ref 14–54)
ANION GAP: 16 — AB (ref 5–15)
AST: 43 U/L — AB (ref 15–41)
BILIRUBIN TOTAL: 0.8 mg/dL (ref 0.3–1.2)
BUN: 24 mg/dL — AB (ref 6–20)
CALCIUM: 9.2 mg/dL (ref 8.9–10.3)
CO2: 20 mmol/L — AB (ref 22–32)
Chloride: 104 mmol/L (ref 101–111)
Creatinine, Ser: 1.93 mg/dL — ABNORMAL HIGH (ref 0.44–1.00)
GFR calc Af Amer: 38 mL/min — ABNORMAL LOW (ref >60)
GFR calc non Af Amer: 33 mL/min — ABNORMAL LOW (ref >60)
GLUCOSE: 74 mg/dL (ref 65–99)
Potassium: 4.3 mmol/L (ref 3.5–5.1)
SODIUM: 140 mmol/L (ref 135–145)
Total Protein: 8 g/dL (ref 6.5–8.1)

## 2017-08-15 LAB — ACETAMINOPHEN LEVEL

## 2017-08-15 LAB — OSMOLALITY: Osmolality: 297 mOsm/kg — ABNORMAL HIGH (ref 275–295)

## 2017-08-15 LAB — GLUCOSE, CAPILLARY: Glucose-Capillary: 102 mg/dL — ABNORMAL HIGH (ref 65–99)

## 2017-08-15 LAB — LACTIC ACID, PLASMA
Lactic Acid, Venous: 6.1 mmol/L (ref 0.5–1.9)
Lactic Acid, Venous: 0.7 mmol/L (ref 0.5–1.9)

## 2017-08-15 LAB — POCT PREGNANCY, URINE: Preg Test, Ur: NEGATIVE

## 2017-08-15 LAB — SALICYLATE LEVEL

## 2017-08-15 MED ORDER — HYDROMORPHONE HCL 1 MG/ML IJ SOLN: 4.0000 mg | INTRAMUSCULAR | Status: AC

## 2017-08-15 MED ORDER — ROCURONIUM BROMIDE 50 MG/5ML IV SOLN
60.0000 mg | Freq: Once | INTRAVENOUS | Status: AC
Start: 2017-08-15 — End: 2017-08-15
  Administered 2017-08-15: 60 mg via INTRAVENOUS
  Filled 2017-08-15: qty 6

## 2017-08-15 MED ORDER — SODIUM CHLORIDE 0.9 % IV SOLN
INTRAVENOUS | Status: AC
  Administered 2017-08-16: via INTRAVENOUS

## 2017-08-15 MED ORDER — VECURONIUM BROMIDE 10 MG IV SOLR
5.0000 mg | Freq: Once | INTRAVENOUS | Status: AC
  Administered 2017-08-15: 5 mg via INTRAVENOUS

## 2017-08-15 MED ORDER — IPRATROPIUM-ALBUTEROL 0.5-2.5 (3) MG/3ML IN SOLN: 3.0000 mL | Freq: Four times a day (QID) | RESPIRATORY_TRACT | Status: DC | PRN

## 2017-08-15 MED ORDER — DEXTROSE 5 % IV SOLN
2.0000 g | Freq: Once | INTRAVENOUS | Status: AC
  Administered 2017-08-15: 2 g via INTRAVENOUS
  Filled 2017-08-15: qty 2

## 2017-08-15 MED ORDER — FENTANYL CITRATE (PF) 100 MCG/2ML IJ SOLN
INTRAMUSCULAR | Status: AC
  Administered 2017-08-15: 100 ug via INTRAVENOUS
  Filled 2017-08-15: qty 2

## 2017-08-15 MED ORDER — ONDANSETRON HCL 4 MG/2ML IJ SOLN
4.0000 mg | Freq: Four times a day (QID) | INTRAMUSCULAR | Status: DC | PRN
  Administered 2017-08-18: 4 mg via INTRAVENOUS
  Filled 2017-08-15: qty 2

## 2017-08-15 MED ORDER — IPRATROPIUM-ALBUTEROL 0.5-2.5 (3) MG/3ML IN SOLN
3.0000 mL | Freq: Four times a day (QID) | RESPIRATORY_TRACT | Status: DC
  Administered 2017-08-16 – 2017-08-17 (×5): 3 mL via RESPIRATORY_TRACT
  Filled 2017-08-15 (×7): qty 3

## 2017-08-15 MED ORDER — PROPOFOL 1000 MG/100ML IV EMUL
5.0000 ug/kg/min | INTRAVENOUS | Status: DC
Start: 2017-08-15 — End: 2017-08-16
  Administered 2017-08-16: 70 ug/kg/min via INTRAVENOUS
  Administered 2017-08-16: 50 ug/kg/min via INTRAVENOUS
  Filled 2017-08-15 (×2): qty 100

## 2017-08-15 MED ORDER — SODIUM CHLORIDE 0.9 % IV BOLUS (SEPSIS)
1000.0000 mL | Freq: Once | INTRAVENOUS | Status: AC
  Administered 2017-08-15: 1000 mL via INTRAVENOUS

## 2017-08-15 MED ORDER — PROPOFOL 1000 MG/100ML IV EMUL: 5.0000 ug/kg/min | INTRAVENOUS | Status: DC

## 2017-08-15 MED ORDER — SODIUM CHLORIDE 0.9 % IV SOLN
1.0000 g | Freq: Two times a day (BID) | INTRAVENOUS | Status: DC
  Administered 2017-08-16 (×2): 1 g via INTRAVENOUS
  Filled 2017-08-15 (×3): qty 1

## 2017-08-15 MED ORDER — ETOMIDATE 2 MG/ML IV SOLN
15.0000 mg | Freq: Once | INTRAVENOUS | Status: AC
  Administered 2017-08-15: 15 mg via INTRAVENOUS

## 2017-08-15 MED ORDER — MIDAZOLAM HCL 5 MG/5ML IJ SOLN
8.0000 mg | Freq: Once | INTRAMUSCULAR | Status: AC
Start: 2017-08-15 — End: 2017-08-15
  Administered 2017-08-15: 8 mg via INTRAVENOUS

## 2017-08-15 MED ORDER — PROPOFOL 1000 MG/100ML IV EMUL
20.0000 ug/kg/min | Freq: Once | INTRAVENOUS | Status: AC
  Administered 2017-08-15: 30 ug/kg/min via INTRAVENOUS
  Filled 2017-08-15: qty 100

## 2017-08-15 MED ORDER — LORAZEPAM 2 MG/ML IJ SOLN
0.5000 mg/h | INTRAVENOUS | Status: DC
  Administered 2017-08-15: 8 mg/h via INTRAVENOUS
  Administered 2017-08-15: 6 mg/h via INTRAVENOUS
  Filled 2017-08-15 (×2): qty 25

## 2017-08-15 MED ORDER — HEPARIN SODIUM (PORCINE) 5000 UNIT/ML IJ SOLN
5000.0000 [IU] | Freq: Three times a day (TID) | INTRAMUSCULAR | Status: DC
  Administered 2017-08-16 – 2017-08-18 (×6): 5000 [IU] via SUBCUTANEOUS
  Filled 2017-08-15 (×7): qty 1

## 2017-08-15 MED ORDER — MIDAZOLAM HCL 5 MG/5ML IJ SOLN
INTRAMUSCULAR | Status: AC
  Filled 2017-08-15: qty 10

## 2017-08-15 MED ORDER — ONDANSETRON HCL 4 MG PO TABS: 4.0000 mg | ORAL_TABLET | Freq: Four times a day (QID) | ORAL | Status: DC | PRN

## 2017-08-15 MED ORDER — LORAZEPAM 2 MG/ML IJ SOLN
1.0000 mg | INTRAMUSCULAR | Status: DC | PRN
  Administered 2017-08-18: 1 mg via INTRAVENOUS
  Filled 2017-08-15: qty 1

## 2017-08-15 MED ORDER — FENTANYL CITRATE (PF) 100 MCG/2ML IJ SOLN
100.0000 ug | INTRAMUSCULAR | Status: DC | PRN
  Administered 2017-08-16 (×2): 50 ug via INTRAVENOUS
  Filled 2017-08-15: qty 2

## 2017-08-15 MED ORDER — MIDAZOLAM HCL 5 MG/5ML IJ SOLN
4.0000 mg | Freq: Once | INTRAMUSCULAR | Status: AC
Start: 2017-08-15 — End: 2017-08-15
  Administered 2017-08-15: 4 mg via INTRAMUSCULAR

## 2017-08-15 MED ORDER — LEVOFLOXACIN IN D5W 750 MG/150ML IV SOLN
750.0000 mg | Freq: Once | INTRAVENOUS | Status: AC
  Administered 2017-08-15: 750 mg via INTRAVENOUS
  Filled 2017-08-15: qty 150

## 2017-08-15 MED ORDER — AZTREONAM 1 G IJ SOLR
1.0000 g | Freq: Three times a day (TID) | INTRAMUSCULAR | Status: DC
  Filled 2017-08-15 (×2): qty 1

## 2017-08-15 MED ORDER — FENTANYL CITRATE (PF) 100 MCG/2ML IJ SOLN: 100.0000 ug | Freq: Once | INTRAMUSCULAR | Status: DC

## 2017-08-15 MED ORDER — GENTAMICIN SULFATE 40 MG/ML IJ SOLN
2.5000 mg/kg | Freq: Once | INTRAVENOUS | Status: AC
  Administered 2017-08-15: 140 mg via INTRAVENOUS
  Filled 2017-08-15: qty 3.5

## 2017-08-15 MED ORDER — HYDROMORPHONE HCL 1 MG/ML IJ SOLN
INTRAMUSCULAR | Status: AC
  Filled 2017-08-15: qty 4

## 2017-08-15 MED ORDER — MIDAZOLAM HCL 5 MG/5ML IJ SOLN
10.0000 mg | Freq: Once | INTRAMUSCULAR | Status: AC
  Administered 2017-08-15: 10 mg via INTRAVENOUS

## 2017-08-15 MED ORDER — FENTANYL CITRATE (PF) 100 MCG/2ML IJ SOLN
100.0000 ug | INTRAMUSCULAR | Status: DC | PRN
Start: 2017-08-15 — End: 2017-08-16

## 2017-08-15 MED ORDER — LEVOFLOXACIN IN D5W 750 MG/150ML IV SOLN: 750.0000 mg | INTRAVENOUS | Status: DC

## 2017-08-15 MED ORDER — FENTANYL CITRATE (PF) 100 MCG/2ML IJ SOLN
100.0000 ug | Freq: Once | INTRAMUSCULAR | Status: AC
  Administered 2017-08-15: 100 ug via INTRAVENOUS
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
## 2017-08-15 NOTE — Progress Notes (Addendum)
Pharmacy Antibiotic Note  [name] is a 36 y.o. female admitted on 08/15/2017 with sepsis.  Pharmacy has been consulted for aztreonam and levofloxacin dosing.  Plan: 1. Aztreonam 2 gm IV x 1 in ED followed by aztreonam 1 gm IV Q8H 2. Levofloxacin 750 mg IV Q48H  Patient also received gentamicin 140 mg IV x 1 in ED, which we have not been consulted to continue at this time.  10/04 0000 Abx changed to meropenem by attending.   Height:  (162.6 cm) Weight: 121 lb (54.9 kg) IBW/kg (Calculated) : 54.7  Temp (24hrs), Avg:102 F (38.9 C), Min:99.2 F (37.3 C), Max:104.8 F (40.4 C)   Recent Labs Lab 08/15/17 1804  WBC 15.3*  CREATININE 1.93*  LATICACIDVEN 6.1*    Estimated Creatinine Clearance: 35.5 mL/min (A) (by C-G formula based on SCr of 1.93 mg/dL (H)).    Allergies  Allergen Reactions  . Amoxicillin Rash  . Latex   . Vancomycin   . Clindamycin/Lincomycin Rash   Thank you for allowing pharmacy to be a part of this patient's care.  Carola Frost, Pharm.D., BCPS Clinical Pharmacist 08/15/2017 7:47 PM

## 2017-08-15 NOTE — ED Notes (Signed)
Gave patient a blanket. 

## 2017-08-15 NOTE — ED Notes (Signed)
Rebecca RN, aware of bed assigned 

## 2017-08-15 NOTE — ED Notes (Signed)
Small bag of white pills, 5 in total with 10 325 on one side, and RP on the other given to [employer].

## 2017-08-15 NOTE — ED Notes (Signed)
Pt noted to have snoring respirations at this time. Jaw thrust, chin tilt performed by EMS, pt placed on NRB.

## 2017-08-15 NOTE — ED Notes (Signed)
Ice packs applied to axilla, neck, and groin by Penni Bombard, RN and this RN.

## 2017-08-15 NOTE — ED Notes (Addendum)
RN's: Hendricks Milo and Vernona Rieger to charge nurse to waste Ativan IVPB of 50ml that was started at 1945 and D/C at 2220. Waste equaled to 50ml from a IVPB drip that was attached to pt and Lurena Joiner, RN found that pt did receive approximately 30ml that was left in previous IV tubing used for finished IVPB of 50ml bag of Ativan and attached to new IVPB of Ativan 50ml. Medication wasted with all 4 RN's signatures on yellow pharmacy sheet and sent to pharmacy. MD made aware.

## 2017-08-15 NOTE — ED Notes (Signed)
Patient placed on bear hugger due to temp

## 2017-08-15 NOTE — ED Notes (Signed)
Ice packs taken off of patient

## 2017-08-15 NOTE — ED Notes (Signed)
Date and time results received: 08/15/17 1908  Test: Latic Acid  Critical Value: 6.1  Name of Provider Notified: Dr. Scotty Court made aware

## 2017-08-15 NOTE — ED Notes (Signed)
PAtient starting to wake up and moving around. Per MD Stafford bolus patient  of ativan. Bolus given

## 2017-08-15 NOTE — ED Notes (Signed)
Patient was stuck multiple times by multiple RN to get lines and cultures. Each stick was unsuccessful and MD made aware and antibiotics not given due to cultures not being collected. Patient also keeps waking up.

## 2017-08-15 NOTE — Consult Note (Signed)
PULMONARY / CRITICAL CARE MEDICINE   Name: KEITH CANCIO MRN: [id number] DOB: 12-01-1980    ADMISSION DATE:  08/15/2017 CONSULTATION DATE: 08/15/2017  REFERRING MD: Dr. Anne Hahn  CHIEF COMPLAINT: Drug Overdose   HISTORY OF PRESENT ILLNESS:   This is a 36 yo female with PMH Tenosynovitis, IV drug use, MRSA, HSV2, Hepatitis C, Asthma, and DVT of Arm.  She presented to [facility] ER 10/3 via EMS and with police present due to altered mental status and severe agitation secondary to drug overdose.  Per ER notes it is uncertain what drugs the pt ingested urine drug screen positive for amphetamine, benzos, cocaine, cannabinoid, and opiates.  In the ER she was diaphoretic and febrile with possible seizure activity concerning for sepsis vs. malignant hyperthermia/serotonin syndrome.  She was recently hospitalized on 09/11 for possible endocarditis at that time echo negative for vegetations.  In the ER she was initially awake but due to continued decline in mentation she subsequently required mechanical intubation for airway protection. CXR concerning for possible pneumonia. She was admitted to ICU by hospitalist team for further workup and treatment PCCM consulted for vent management.  PAST MEDICAL HISTORY :  She  has a past medical history of Acute deep vein thrombosis of arm (HCC) (08/19/2014); Asthma; HCV (hepatitis C virus) (07/17/2013); Herpes infection in pregnancy (05/18/2013); Infection with methicillin-resistant Staphylococcus aureus (03/19/2013); IV drug abuse (HCC); and Tenosynovitis (08/19/2014).  PAST SURGICAL HISTORY: She  has a past surgical history that includes Cesarean section; Neck surgery; and Cesarean section (N/A, 05/18/2015).  Allergies  Allergen Reactions  . Amoxicillin Rash  . Latex   . Vancomycin   . Clindamycin/Lincomycin Rash    No current facility-administered medications on file prior to encounter.    Current Outpatient Prescriptions on File Prior to Encounter  Medication Sig  .  amLODipine (NORVASC) 10 MG tablet Take 1 tablet (10 mg total) by mouth daily.  . carvedilol (COREG) 12.5 MG tablet Take 1 tablet (12.5 mg total) by mouth 2 (two) times daily with a meal.  . hydrALAZINE (APRESOLINE) 25 MG tablet Take 1 tablet (25 mg total) by mouth every 8 (eight) hours.  . feeding supplement, ENSURE ENLIVE, (ENSURE ENLIVE) LIQD Take 237 mLs by mouth 2 (two) times daily between meals.    FAMILY HISTORY:  Her indicated that her mother is alive. She indicated that her father is alive. She indicated that the status of her maternal grandmother is unknown.    SOCIAL HISTORY: She  reports that she has been smoking Cigarettes.  She has a 7.00 pack-year smoking history. She has never used smokeless tobacco. She reports that she drinks alcohol. She reports that she uses drugs, including Marijuana, Cocaine, Heroin, IV, Amphetamines, and "Crack" cocaine.  REVIEW OF SYSTEMS:   Unable to assess pt intubated   SUBJECTIVE:  Unable to assess pt intubated   VITAL SIGNS: BP (!) 124/53   Pulse 86   Temp (!) 96.5 F (35.8 C)   Resp (!) 22   Ht  (1.626 m)   Wt 54.9 kg (121 lb)   SpO2 100%   BMI 20.77 kg/m   HEMODYNAMICS:    VENTILATOR SETTINGS: Vent Mode: AC FiO2 (%):  [35 %] 35 % Set Rate:  [16 bmp] 16 bmp Vt Set:  [500 mL] 500 mL PEEP:  [5 cmH20] 5 cmH20  INTAKE / OUTPUT: No intake/output data recorded.  PHYSICAL EXAMINATION: General: acutely ill disheveled Caucasian female, NAD mechanically intubated   Neuro: sedated, not following commands,  withdraws from painful stimulation, PERRL  HEENT: supple, no JVD Cardiovascular: nsr, s1s2, no M/R/G Lungs: rhonchi throughout, even, non labored: no rales, crackle, or wheezes  Abdomen: +BS x4, soft, non distended  Musculoskeletal: normal bulk and tone, no edema  Skin: scattered abrasions and ecchymosis, track marks present bilateral arms and neck   LABS:  BMET  Recent Labs Lab 08/15/17 1804  NA 140  K 4.3  CL  104  CO2 20*  BUN 24*  CREATININE 1.93*  GLUCOSE 74    Electrolytes  Recent Labs Lab 08/15/17 1804  CALCIUM 9.2    CBC  Recent Labs Lab 08/15/17 1804  WBC 15.3*  HGB 8.7*  HCT 25.8*  PLT 294    Coag's No results for input(s): APTT, INR in the last 168 hours.  Sepsis Markers  Recent Labs Lab 08/15/17 1804 08/15/17 2059  LATICACIDVEN 6.1* 0.7    ABG No results for input(s): PHART, PCO2ART, PO2ART in the last 168 hours.  Liver Enzymes  Recent Labs Lab 08/15/17 1804  AST 43*  ALT 18  ALKPHOS 64  BILITOT 0.8  ALBUMIN 4.1    Cardiac Enzymes No results for input(s): TROPONINI, PROBNP in the last 168 hours.  Glucose No results for input(s): GLUCAP in the last 168 hours.  Imaging Dg Abdomen 1 View  Result Date: 08/15/2017 CLINICAL DATA:  Line placement EXAM: ABDOMEN - 1 VIEW COMPARISON:  07/25/2017 FINDINGS: There is an enteric tube with tip in the left upper quadrant consistent with location in the upper stomach. Right femoral venous catheter with tip overlying the L4 vertebra consistent with location in the distal inferior vena cava or proximal common iliac vein. Scattered gas and stool throughout the colon. No small or large bowel distention. No radiopaque stones. Visualized bones appear intact. IMPRESSION: Enteric tube and right femoral venous catheter appear in satisfactory location. Nonobstructing bowel gas pattern. Electronically Signed   By: Burman Nieves M.D.   On: 08/15/2017 21:28   Ct Head Wo Contrast  Result Date: 08/15/2017 CLINICAL DATA:  Altered level of consciousness EXAM: CT HEAD WITHOUT CONTRAST TECHNIQUE: Contiguous axial images were obtained from the base of the skull through the vertex without intravenous contrast. COMPARISON:  07/25/2017, 07/16/2016 FINDINGS: Brain: No evidence of acute infarction, hemorrhage, hydrocephalus, extra-axial collection or mass lesion/mass effect. Vascular: No hyperdense vessel or unexpected calcification.  Skull: Normal. Negative for fracture or focal lesion. Sinuses/Orbits: Moderate mucosal thickening within the ethmoid, sphenoid and frontal sinuses. No acute orbital abnormality Other: None IMPRESSION: No CT evidence for acute intracranial abnormality.  Sinusitis Electronically Signed   By: Jasmine Pang M.D.   On: 08/15/2017 20:01   Dg Chest Portable 1 View  Result Date: 08/15/2017 CLINICAL DATA:  Intubation, altered mental status. EXAM: PORTABLE CHEST 1 VIEW COMPARISON:  07/26/2017 and 07/25/2017 FINDINGS: Interval placement of nasogastric tube coursing into the stomach and off the inferior portion of the film as tip is not visualized. Placement of endotracheal tube with tip 2.1 cm above the carina along the right tracheal wall. Lungs are adequately inflated with opacification in the right base likely atelectasis, although cannot exclude infection. Cardiomediastinal silhouette and remainder of the exam is unchanged. IMPRESSION: New right base opacification likely atelectasis, although cannot exclude developing infection. Tubes and lines as described. Electronically Signed   By: Elberta Fortis M.D.   On: 08/15/2017 19:11   STUDIES:  CT Head 10/3>>No CT evidence for acute intracranial abnormality.  Sinusitis  CULTURES: Blood 10/3>> Urine 10/3>> Respiratory 10/3>>  ANTIBIOTICS: Gentamycin x1 dose 10/3 Aztreonam 10/3>> Levaquin 10/3>>  SIGNIFICANT EVENTS: 10/3-Pt admitted to ICU mechanically intubated   LINES/TUBES: ETT 10/3>> Right femoral CVL 10/3>>  ASSESSMENT / PLAN:  PULMONARY A: Mechanical Intubation for Airway Protection and Possible Pneumonia vs. Atelectasis  P:   Cont vent support - settings reviewed and/or adjusted Cont vent bundle Daily SBT if/when meets criteria Repeat CXR in am ABG post intubation   CARDIOVASCULAR A:  No acute issues Hx: DVT of arm P:  Continuous telemetry monitoring  Maintain map >65  RENAL A:   Acute Renal Failure Lactic  Acidosis-resolved P:   Trend BMP Replace electrolytes as indicated  Monitor UOP NS  ml/hr  GASTROINTESTINAL A:   No acute issues P:   Keep NPO for now  Pepcid for SUP prophylaxis  HEMATOLOGIC A:   Anemia without acute blood loss P:  Subq heparin for VTE prophylaxis Trend CBC Monitor for s/sx of bleeding Transfuse for hgb <7  INFECTIOUS A:   Sepsis secondary to possible pneumonia IV drug use possible endocarditis  Leukocytosis P:   Trend WBC and monitor fever curve Trend PCT and lactic acid Follow cultures Continue current abx  ENDOCRINE A:   No acute issues P:   Monitor serum glucose if >180 will add SSI   NEUROLOGIC A:   Acute encephalopathy secondary to drug overdose and questionable seizure activity  ICU/Vent Discomfort  P:   RASS goal: 0 to -1 Propofol gtt to maintain RASS goal  Prn fentanyl for pain management WUA daily  Prn ativan for seizure activity  EEG pending  Once pt off vent she will need polysubstance abuse cessation counseling and psychiatric consult    FAMILY  - Updates: No family at bedside to update at this time 08/15/17  - Inter-disciplinary family meet or Palliative Care meeting due by:  08/22/17   Sonda Rumble, Juel Burrow  Pulmonary/Critical Care Pager [phone] (please enter 7 digits) PCCM Consult Pager [phone] (please enter 7 digits)

## 2017-08-15 NOTE — ED Provider Notes (Signed)
[facility] Emergency Department Provider Note  ____________________________________________  Time seen: Approximately 7:22 PM  I have reviewed the triage vital signs and the nursing notes.   HISTORY  Chief Complaint Drug Overdose  Level 5 Caveat: Portions of the History and Physical were unable to be obtained due to altered mental status.   HPI [name] is a 36 y.o. female brought to the ED by EMS and police due to altered mental status and severe agitation in the setting of unknown drug ingestion. Patient has a history of cocaine and heroin abuse, recently hospitalized for possible endocarditis. Patient is not able to speak coherently or answer any questions.     Past Medical History:  Diagnosis Date  . Acute deep vein thrombosis of arm (HCC) 08/19/2014   Last Assessment & Plan:  - diagnosed 08/2014 in hospital - s/p heparin gtt - patient never took her xarelto x 3 month course that was planned - no symptoms today.  No further treatment - we discussed avoiding IVDU (see below) to reduce her chances of provoked upper extremity DVT   . Asthma   . HCV (hepatitis C virus) 07/17/2013   Last Assessment & Plan:  - positive antibody most recently 08/2014 - hep C RNA 08/2014 was negative - prior to that, HCV RNA [id number] in 06/2013. No record for genotype or imaging.   - patient counseled that she could become re-infected with continued IVDU or unprotected sexual encounters.   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   Last Assessment & Plan:  Discussed routine use of Valtrex prophylaxis at 36 weeks. She will be delivered by cesarean at 36-37 weeks.   . Infection with methicillin-resistant Staphylococcus aureus 03/19/2013   Overview: Arm abscess with MRSA, treated.  Subsequent screening negative  . IV drug abuse (HCC)   . Tenosynovitis 08/19/2014     Patient Active Problem List   Diagnosis Date Noted  .  HCAP (healthcare-associated pneumonia) 08/15/2017  . Acute respiratory failure with hypoxia (HCC) 08/15/2017  . Acute renal failure (HCC)   . Altered mental status 07/24/2017  . Delirium, drug-induced (HCC) 07/24/2017  . Opiate abuse, continuous (HCC) 07/24/2017  . 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 Severe sepsis (HCC) 07/16/2016  . AKI (acute kidney injury) (HCC) 07/16/2016  . Obtundation 07/16/2016  . Elevated lactic acid level 07/16/2016  . Drug abuse, IV (HCC) 07/16/2016  . Elevated LFTs 07/16/2016  . Sepsis (HCC) 07/10/2015  . Oligohydramnios 05/18/2015  . S/P cesarean section 05/18/2015  . Preterm uterine contractions 05/17/2015  . Oligohydramnios antepartum 05/17/2015  . H/O cesarean section complicating pregnancy 05/17/2015  . Preterm contractions 05/17/2015  . AA (alcohol abuse) 12/08/2014  . Drug abuse during pregnancy (HCC) 12/08/2014  . High risk sexual behavior 12/08/2014  . Tenosynovitis 08/19/2014  . Acute deep vein thrombosis of arm (HCC) 08/19/2014  . HCV (hepatitis C virus) 07/17/2013  . H/O cesarean section 05/18/2013  . Herpes infection in pregnancy 05/18/2013  . Infection with methicillin-resistant Staphylococcus aureus 03/19/2013  . Cannabis abuse 03/18/2013  . Polysubstance abuse (HCC) 03/18/2013  . Compulsive tobacco user syndrome 03/17/2013     Past Surgical History:  Procedure Laterality Date  . CESAREAN SECTION    . CESAREAN SECTION N/A 05/18/2015   Procedure: CESAREAN SECTION;  Surgeon: Hildred Laser, MD;  Location: [facility] ORS;  Service: Obstetrics;  Laterality: N/A;  . NECK SURGERY     Fusion  Prior to Admission medications   Medication Sig Start Date End Date Taking? Authorizing Provider  amLODipine (NORVASC) 10 MG tablet Take 1 tablet (10 mg total) by mouth daily. 07/29/17  Yes Katha Hamming, MD  carvedilol (COREG) 12.5 MG tablet Take 1 tablet (12.5 mg total) by mouth 2 (two) times daily with a meal. 07/28/17  Yes Katha Hamming, MD  hydrALAZINE (APRESOLINE) 25 MG tablet Take 1 tablet (25 mg total) by mouth every 8 (eight) hours. 07/28/17  Yes Katha Hamming, MD  feeding supplement, ENSURE ENLIVE, (ENSURE ENLIVE) LIQD Take 237 mLs by mouth 2 (two) times daily between meals. 07/28/17   Katha Hamming, MD     Allergies Amoxicillin; Latex; Vancomycin; and Clindamycin/lincomycin   Family History  Problem Relation Age of Onset  . Diabetes Mother   . COPD Mother   . Hypertension Mother   . Lung cancer Mother   . Alcohol abuse Father   . Lung cancer Maternal Grandmother     Social History Social History  Substance Use Topics  . Smoking status: Current Every Day Smoker    Packs/day: 1.00    Years: 7.00    Types: Cigarettes  . Smokeless tobacco: Never Used  . Alcohol use 0.0 oz/week     Comment: bootlegger x2    Review of Systems unable to obtain due to altered mental status ____________________________________________   PHYSICAL EXAM:  VITAL SIGNS: ED Triage Vitals  Enc Vitals Group     BP 08/15/17 1807 117/71     Pulse Rate 08/15/17 1807 (!) 123     Resp 08/15/17 1807 (!) 29     Temp 08/15/17 1807 (!) 104.8 F (40.4 C)     Temp Source 08/15/17 1807 Rectal     SpO2 08/15/17 1807 100 %     Weight 08/15/17 1750 121 lb (54.9 kg)     Height 08/15/17 1750  (1.626 m)     Head Circumference --      Peak Flow --      Pain Score --      Pain Loc --      Pain Edu? --      Excl. in GC? --     Vital signs reviewed, nursing assessments reviewed.   Constitutional:   agitated, stuporous. severe distress. Eyes:   No scleral icterus.  pupils equal, sluggish, 3-4 mm bilateral. ENT   Head:   Normocephalic and atraumatic.   Nose:   No congestion/rhinnorhea. no epistaxis   Mouth/Throat:   MMM, no pharyngeal erythema. No peritonsillar mass.    Neck:  Full ROM. No appreciable midline tenderness or step-off Hematological/Lymphatic/Immunilogical:   No cervical  lymphadenopathy. Cardiovascular:   tachycardia heart rate 120. Symmetric bilateral radial and DP pulses.  No murmurs.  Respiratory:   Normal respiratory effort without tachypnea/retractions. Breath sounds are clear and equal bilaterally. No wheezes/rales/rhonchi. Gastrointestinal:   Soft and nontender. Non distended. There is no CVA tenderness.  No rebound, rigidity, or guarding.scar from previous Pfannenstiel incision Genitourinary:   deferred Musculoskeletal:   Normal range of motion in all extremities. No joint effusions.  No edema. Neurologic:   grunting, no coherent speech. Not following commands. GCS = E1V2M5 =8 Skin:    Skin is hot, diaphoretic and intact. No rash noted.  No petechiae, purpura, or bullae.  ____________________________________________    LABS (pertinent positives/negatives) (all labs ordered are listed, but only abnormal results are displayed) Labs Reviewed  CBC WITH DIFFERENTIAL/PLATELET - Abnormal; Notable for the following:  Result Value   WBC 15.3 (*)    RBC 2.83 (*)    Hemoglobin 8.7 (*)    HCT 25.8 (*)    RDW 15.6 (*)    Neutro Abs 9.5 (*)    Lymphs Abs 3.8 (*)    Monocytes Absolute 1.3 (*)    All other components within normal limits  COMPREHENSIVE METABOLIC PANEL - Abnormal; Notable for the following:    CO2 20 (*)    BUN 24 (*)    Creatinine, Ser 1.93 (*)    AST 43 (*)    GFR calc non Af Amer 33 (*)    GFR calc Af Amer 38 (*)    Anion gap 16 (*)    All other components within normal limits  LACTIC ACID, PLASMA - Abnormal; Notable for the following:    Lactic Acid, Venous 6.1 (*)    All other components within normal limits  ACETAMINOPHEN LEVEL - Abnormal; Notable for the following:    Acetaminophen (Tylenol), Serum <10 (*)    All other components within normal limits  URINALYSIS, COMPLETE (UACMP) WITH MICROSCOPIC - Abnormal; Notable for the following:    Color, Urine YELLOW (*)    APPearance HAZY (*)    Protein, ur 30 (*)    All  other components within normal limits  URINE DRUG SCREEN, QUALITATIVE ([facility] ONLY) - Abnormal; Notable for the following:    Amphetamines, Ur Screen POSITIVE (*)    Cocaine Metabolite,Ur [place] POSITIVE (*)    Opiate, Ur Screen POSITIVE (*)    Cannabinoid 50 Ng, Ur [place] POSITIVE (*)    Benzodiazepine, Ur Scrn POSITIVE (*)    All other components within normal limits  BLOOD GAS, VENOUS - Abnormal; Notable for the following:    pO2, Ven 384.0 (*)    Acid-base deficit 6.5 (*)    All other components within normal limits  OSMOLALITY - Abnormal; Notable for the following:    Osmolality 297 (*)    All other components within normal limits  CULTURE, BLOOD (ROUTINE X 2)  CULTURE, BLOOD (ROUTINE X 2)  LACTIC ACID, PLASMA  SALICYLATE LEVEL  POC URINE PREG, ED  POCT PREGNANCY, URINE   ____________________________________________   EKG  interpreted by me Sinus rhythm, rate of 98, normal axis and intervals. Normal QRS ST segments and T waves.  ____________________________________________    RADIOLOGY  Dg Abdomen 1 View  Result Date: 08/15/2017 CLINICAL DATA:  Line placement EXAM: ABDOMEN - 1 VIEW COMPARISON:  07/25/2017 FINDINGS: There is an enteric tube with tip in the left upper quadrant consistent with location in the upper stomach. Right femoral venous catheter with tip overlying the L4 vertebra consistent with location in the distal inferior vena cava or proximal common iliac vein. Scattered gas and stool throughout the colon. No small or large bowel distention. No radiopaque stones. Visualized bones appear intact. IMPRESSION: Enteric tube and right femoral venous catheter appear in satisfactory location. Nonobstructing bowel gas pattern. Electronically Signed   By: Burman Nieves M.D.   On: 08/15/2017 21:28   Ct Head Wo Contrast  Result Date: 08/15/2017 CLINICAL DATA:  Altered level of consciousness EXAM: CT HEAD WITHOUT CONTRAST TECHNIQUE: Contiguous axial images were obtained from the  base of the skull through the vertex without intravenous contrast. COMPARISON:  07/25/2017, 07/16/2016 FINDINGS: Brain: No evidence of acute infarction, hemorrhage, hydrocephalus, extra-axial collection or mass lesion/mass effect. Vascular: No hyperdense vessel or unexpected calcification. Skull: Normal. Negative for fracture or focal lesion. Sinuses/Orbits: Moderate mucosal  thickening within the ethmoid, sphenoid and frontal sinuses. No acute orbital abnormality Other: None IMPRESSION: No CT evidence for acute intracranial abnormality.  Sinusitis Electronically Signed   By: Jasmine Pang M.D.   On: 08/15/2017 20:01   Dg Chest Portable 1 View  Result Date: 08/15/2017 CLINICAL DATA:  Intubation, altered mental status. EXAM: PORTABLE CHEST 1 VIEW COMPARISON:  07/26/2017 and 07/25/2017 FINDINGS: Interval placement of nasogastric tube coursing into the stomach and off the inferior portion of the film as tip is not visualized. Placement of endotracheal tube with tip 2.1 cm above the carina along the right tracheal wall. Lungs are adequately inflated with opacification in the right base likely atelectasis, although cannot exclude infection. Cardiomediastinal silhouette and remainder of the exam is unchanged. IMPRESSION: New right base opacification likely atelectasis, although cannot exclude developing infection. Tubes and lines as described. Electronically Signed   By: Elberta Fortis M.D.   On: 08/15/2017 19:11    ____________________________________________   PROCEDURES Procedures CRITICAL CARE Performed by: Scotty Court, Felishia Wartman   Total critical care time: 45 minutes  Critical care time was exclusive of separately billable procedures and treating other patients.  Critical care was necessary to treat or prevent imminent or life-threatening deterioration.  Critical care was time spent personally by me on the following activities: development of treatment plan with patient and/or surrogate as well as  nursing, discussions with consultants, evaluation of patient's response to treatment, examination of patient, obtaining history from patient or surrogate, ordering and performing treatments and interventions, ordering and review of laboratory studies, ordering and review of radiographic studies, pulse oximetry and re-evaluation of patient's condition.  INTUBATION Performed by: Scotty Court, Kazuma Elena  Required items: required blood products, implants, devices, and special equipment available Patient identity confirmed: provided demographic data and hospital-assigned identification number Time out: Immediately prior to procedure a "time out" was called to verify the correct patient, procedure, equipment, support staff and site/side marked as required.  Indications: airway protection  Intubation method: Glidescope Laryngoscopy   Preoxygenation: BVM  Sedatives: Etomidate Paralytic: rocuronium  Tube Size: 7.5 cuffed  Post-procedure assessment: chest rise and ETCO2 monitor Breath sounds: equal and absent over the epigastrium Tube secured with: ETT holder Chest x-ray interpreted by radiologist and me.  Chest x-ray findings: endotracheal tube in appropriate position  Patient tolerated the procedure well with no immediate complications.   CENTRAL LINE Performed by: Scotty Court, Eliz Nigg Consent: The procedure was performed in an emergent situation. Required items: required blood products, implants, devices, and special equipment available Patient identity confirmed: arm band and provided demographic data Time out: Immediately prior to procedure a "time out" was called to verify the correct patient, procedure, equipment, support staff and site/side marked as required. Indications: vascular access Anesthesia: local infiltration Local anesthetic: lidocaine 1% without epinephrine Anesthetic total: 3 ml Patient sedated: yes, intubated on an Ativan drip Preparation: skin prepped with 2%  chlorhexidine Skin prep agent dried: skin prep agent completely dried prior to procedure Sterile barriers: all five maximum sterile barriers used - cap, mask, sterile gown, sterile gloves, and large sterile sheet Hand hygiene: hand hygiene performed prior to central venous catheter insertion  Location details: right femoral vein  Catheter type: triple lumen Catheter size: 8 Fr Pre-procedure: landmarks identified Ultrasound guidance: yes Successful placement: yes Post-procedure: line sutured and dressing applied Assessment: blood return through all parts, free fluid flow, placement verified by x-ray , catheter follows expected course of iliac vein Patient tolerance: Patient tolerated the procedure well with no immediate complications.  ____________________________________________   INITIAL IMPRESSION / ASSESSMENT AND PLAN / ED COURSE  Pertinent labs & imaging results that were available during my care of the patient were reviewed by me and considered in my medical decision making (see chart for details).    Clinical Course as of Aug 16 2207  Wed Aug 15, 2017  1824 P/w AMS, fever, diaphoresis, possible seizure. Concern for ICH, malignant hyperthermia/serotonin syndrome, or stimulant induced psychosis in setting of unknown overdose.  Pt sedated and intubated on arrival for airway protection and to facilitate medical workup and treatment under emergency consent doctrine.  Ice packs applied for hyperthermia  [PS]  1839 DG chest shows ETT in good position, OGT in good position. Clear lungs.   [PS]  1919 Lactate 6.  Unclear how to interpret given her arrival febrile, severe agitation and diaphoresis.  Possible sepsis, will initiate b. Cx and abx. Has pcn and vanc allergy. Will give cefepime, levaquin, and gent. Temp now 98. DC ice packs. Still awaiting labs, CT head  [PS]  1931 Pt again agitated despite being maintained on high dose ativan drip and receiving fentanyl bolus.  Still  intubated. Will give versed  bolus. Appears resistant to benzos, either from chronic abuse or from substantial stimulant use today.  [PS]  1951 CT head appears unremarkable. Will f/u rad report  [PS]  2011 Still agitated. Biting tube. Will give  dilaudid iv for analgesia. Vecuronium. Due to evidence of extensive drug abuse, will need abx , f/u cx, possible precedex wean until lucid and calm for extubation.   [PS]  2125 Central line placed due to lack of reliable PIV  [PS]    Clinical Course User Index [PS] Sharman Cheek, MD     ____________________________________________   FINAL CLINICAL IMPRESSION(S) / ED DIAGNOSES  Final diagnoses:  Polysubstance abuse (HCC)  Drug overdose, undetermined intent, initial encounter  Hyperthermia  Sepsis, due to unspecified organism [facility])      New Prescriptions   No medications on file     Portions of this note were generated with dragon dictation software. Dictation errors may occur despite best attempts at proofreading.    Sharman Cheek, MD 08/15/17 2209

## 2017-08-15 NOTE — ED Notes (Signed)
Called [facility] for transport.

## 2017-08-16 ENCOUNTER — Encounter
Admission: EM | Disposition: A | Discharge status: Home / Self Care | Payer: Self-pay | Source: Home / Self Care | Attending: Internal Medicine

## 2017-08-16 ENCOUNTER — Inpatient Hospital Stay (HOSPITAL_COMMUNITY): Payer: Self-pay

## 2017-08-16 ENCOUNTER — Inpatient Hospital Stay (HOSPITAL_COMMUNITY)
Admit: 2017-08-16 | Discharge: 2017-08-16 | Disposition: A | Discharge status: Home / Self Care | Payer: Self-pay | Attending: Physician Assistant | Admitting: Physician Assistant

## 2017-08-16 ENCOUNTER — Inpatient Hospital Stay: Payer: Self-pay

## 2017-08-16 DIAGNOSIS — T50904A Poisoning by unspecified drugs, medicaments and biological substances, undetermined, initial encounter: Secondary | ICD-10-CM

## 2017-08-16 DIAGNOSIS — J9602 Acute respiratory failure with hypercapnia: Secondary | ICD-10-CM

## 2017-08-16 DIAGNOSIS — T50901A Poisoning by unspecified drugs, medicaments and biological substances, accidental (unintentional), initial encounter: Secondary | ICD-10-CM

## 2017-08-16 DIAGNOSIS — A419 Sepsis, unspecified organism: Principal | ICD-10-CM

## 2017-08-16 DIAGNOSIS — J9601 Acute respiratory failure with hypoxia: Secondary | ICD-10-CM

## 2017-08-16 DIAGNOSIS — R6520 Severe sepsis without septic shock: Secondary | ICD-10-CM

## 2017-08-16 DIAGNOSIS — F19921 Other psychoactive substance use, unspecified with intoxication with delirium: Secondary | ICD-10-CM

## 2017-08-16 DIAGNOSIS — I339 Acute and subacute endocarditis, unspecified: Secondary | ICD-10-CM

## 2017-08-16 LAB — BLOOD GAS, ARTERIAL
ACID-BASE DEFICIT: 2.8 mmol/L — AB (ref 0.0–2.0)
Bicarbonate: 21.9 mmol/L (ref 20.0–28.0)
FIO2: 0.4
MECHANICAL RATE: 16
MECHVT: 500 mL
O2 Saturation: 99.6 %
PEEP/CPAP: 5 cmH2O
Patient temperature: 37
RATE: 16 resp/min
pCO2 arterial: 37 mmHg (ref 32.0–48.0)
pH, Arterial: 7.38 (ref 7.350–7.450)
pO2, Arterial: 186 mmHg — ABNORMAL HIGH (ref 83.0–108.0)

## 2017-08-16 LAB — BASIC METABOLIC PANEL
ANION GAP: 6 (ref 5–15)
BUN: 20 mg/dL (ref 6–20)
CALCIUM: 8.4 mg/dL — AB (ref 8.9–10.3)
CO2: 23 mmol/L (ref 22–32)
Chloride: 112 mmol/L — ABNORMAL HIGH (ref 101–111)
Creatinine, Ser: 1.35 mg/dL — ABNORMAL HIGH (ref 0.44–1.00)
GFR, EST AFRICAN AMERICAN: 59 mL/min — AB (ref >60)
GFR, EST NON AFRICAN AMERICAN: 51 mL/min — AB (ref >60)
Glucose, Bld: 85 mg/dL (ref 65–99)
POTASSIUM: 3.7 mmol/L (ref 3.5–5.1)
SODIUM: 141 mmol/L (ref 135–145)

## 2017-08-16 LAB — RETICULOCYTES
RBC.: 2.71 MIL/uL — ABNORMAL LOW (ref 3.80–5.20)
Retic Count, Absolute: 32.5 10*3/uL (ref 19.0–183.0)
Retic Ct Pct: 1.2 % (ref 0.4–3.1)

## 2017-08-16 LAB — IRON AND TIBC
IRON: 43 ug/dL (ref 28–170)
SATURATION RATIOS: 14 % (ref 10.4–31.8)
TIBC: 317 ug/dL (ref 250–450)
UIBC: 274 ug/dL

## 2017-08-16 LAB — CBC
HCT: 24 % — ABNORMAL LOW (ref 35.0–47.0)
Hemoglobin: 8.3 g/dL — ABNORMAL LOW (ref 12.0–16.0)
MCH: 31.6 pg (ref 26.0–34.0)
MCHC: 34.5 g/dL (ref 32.0–36.0)
MCV: 91.6 fL (ref 80.0–100.0)
Platelets: 190 10*3/uL (ref 150–440)
RBC: 2.62 MIL/uL — AB (ref 3.80–5.20)
RDW: 16 % — AB (ref 11.5–14.5)
WBC: 6 10*3/uL (ref 3.6–11.0)

## 2017-08-16 LAB — FOLATE: FOLATE: 15.9 ng/mL (ref >5.9)

## 2017-08-16 LAB — FERRITIN: Ferritin: 108 ng/mL (ref 11–307)

## 2017-08-16 LAB — PROCALCITONIN
PROCALCITONIN: 17.64 ng/mL
PROCALCITONIN: 8.14 ng/mL

## 2017-08-16 LAB — MRSA PCR SCREENING: MRSA by PCR: NEGATIVE

## 2017-08-16 LAB — VITAMIN B12: VITAMIN B 12: 295 pg/mL (ref 180–914)

## 2017-08-16 LAB — CK: Total CK: 2444 U/L — ABNORMAL HIGH (ref 38–234)

## 2017-08-16 LAB — TRIGLYCERIDES: TRIGLYCERIDES: 95 mg/dL (ref <150)

## 2017-08-16 SURGERY — ECHOCARDIOGRAM, TRANSESOPHAGEAL
Anesthesia: Moderate Sedation

## 2017-08-16 MED ORDER — ORAL CARE MOUTH RINSE
15.0000 mL | Freq: Four times a day (QID) | OROMUCOSAL | Status: DC
  Administered 2017-08-17 (×2): 15 mL via OROMUCOSAL

## 2017-08-16 MED ORDER — BUTAMBEN-TETRACAINE-BENZOCAINE 2-2-14 % EX AERO
INHALATION_SPRAY | CUTANEOUS | Status: AC
  Administered 2017-08-16: 09:00:00
  Filled 2017-08-16: qty 20

## 2017-08-16 MED ORDER — ACETAMINOPHEN 325 MG PO TABS
650.0000 mg | ORAL_TABLET | ORAL | Status: DC | PRN
  Administered 2017-08-16 – 2017-08-17 (×2): 650 mg via ORAL
  Filled 2017-08-16 (×2): qty 2

## 2017-08-16 MED ORDER — SODIUM CHLORIDE 0.9 % IV SOLN
1.0000 g | Freq: Three times a day (TID) | INTRAVENOUS | Status: DC
  Administered 2017-08-16 – 2017-08-17 (×2): 1 g via INTRAVENOUS
  Filled 2017-08-16 (×4): qty 1

## 2017-08-16 MED ORDER — HALOPERIDOL LACTATE 5 MG/ML IJ SOLN
2.0000 mg | Freq: Four times a day (QID) | INTRAMUSCULAR | Status: DC | PRN
  Administered 2017-08-17 – 2017-08-18 (×2): 2 mg via INTRAVENOUS
  Filled 2017-08-16 (×2): qty 1

## 2017-08-16 MED ORDER — CHLORHEXIDINE GLUCONATE 0.12% ORAL RINSE (MEDLINE KIT)
15.0000 mL | Freq: Two times a day (BID) | OROMUCOSAL | Status: DC
  Administered 2017-08-16: 15 mL via OROMUCOSAL

## 2017-08-16 MED ORDER — LIDOCAINE VISCOUS 2 % MT SOLN
OROMUCOSAL | Status: AC
  Administered 2017-08-16: 09:00:00
  Filled 2017-08-16: qty 15

## 2017-08-16 MED ORDER — FAMOTIDINE IN NACL 20-0.9 MG/50ML-% IV SOLN
20.0000 mg | INTRAVENOUS | Status: DC
  Administered 2017-08-16 – 2017-08-17 (×2): 20 mg via INTRAVENOUS
  Filled 2017-08-16 (×2): qty 50

## 2017-08-16 MED ORDER — DAPTOMYCIN 500 MG IV SOLR
350.0000 mg | INTRAVENOUS | Status: DC
  Administered 2017-08-16: 350 mg via INTRAVENOUS
  Filled 2017-08-16 (×2): qty 7

## 2017-08-16 MED ORDER — ACETAMINOPHEN 650 MG RE SUPP: 650.0000 mg | RECTAL | Status: DC | PRN

## 2017-08-16 NOTE — Consult Note (Signed)
Cardiology Consultation Note  Patient ID: [name], MRN: [id number], DOB/AGE: [birth year] 36 y.o. Admit date: 08/15/2017   Date of Consult: 08/16/2017 Primary Physician: Patient, No Pcp Per Primary Cardiologist: New to [facility] - consult by Fletcher Anon Requesting Physician: Dr. Mortimer Fries, MD  Chief Complaint: AMS Reason for Consult: TEE  HPI: [name] is a 36 y.o. female who is being seen today for the evaluation of TEE at the request of Dr. Mortimer Fries, MD. Patient has a h/o IV drug abuse, MRSA, hepatitis C, HSV2, asthma, and DVT who was recently admitted in mid September with encephalopathy 2/2 drug withdrawl, returned to [facility] on 10/3 with AMS and agitation 2/2 drug overdose.  No prior known cardiac history. Recently admitted in mid September with encephalopathy as above. Troponin peaked at 0.59. Blood cultures were negative.Transthoracic echo at that time showed normal EF and was negative for valvular vegetations. Patient was admitted on 10/3 with AMS and agitation. UDS positive for amphetamine, benzos, cocaine, cannabinoid, and opiates. She required intubated for airway protection due to decline in mentation. Cultures remain negative to date. She remains intubated and sedated. Cardiology asked to perform TEE to evaluate for possible SBE.    Past Medical History:  Diagnosis Date  . Acute deep vein thrombosis of arm (HCC) 08/19/2014   Last Assessment & Plan:  - diagnosed 08/2014 in hospital - s/p heparin gtt - patient never took her xarelto x 3 month course that was planned - no symptoms today.  No further treatment - we discussed avoiding IVDU (see below) to reduce her chances of provoked upper extremity DVT   . Asthma   . HCV (hepatitis C virus) 07/17/2013   Last Assessment & Plan:  - positive antibody most recently 08/2014 - hep C RNA 08/2014 was negative - prior to that, HCV RNA [id number] in 06/2013. No record for genotype or imaging.   - patient counseled that she could become re-infected with  continued IVDU or unprotected sexual encounters.   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   Last Assessment & Plan:  Discussed routine use of Valtrex prophylaxis at 36 weeks. She will be delivered by cesarean at 36-37 weeks.   . Infection with methicillin-resistant Staphylococcus aureus 03/19/2013   Overview: Arm abscess with MRSA, treated.  Subsequent screening negative  . IV drug abuse ([place])   . Tenosynovitis 08/19/2014      Most Recent Cardiac Studies: TTE 07/25/2017: Study Conclusions  - Procedure narrative: Transthoracic echocardiography. The study   was technically difficult, as a result of restricted patient   mobility. - Left ventricle: The cavity size was normal. Wall thickness was   increased in a pattern of mild LVH. Systolic function was normal.   The estimated ejection fraction was in the range of 50% to 55%. - Aortic valve: Valve area (Vmax): 2.44 cm^2. - Mitral valve: There was mild regurgitation. - Pulmonic valve: Peak gradient (S): 13 mm Hg.   Surgical History:  Past Surgical History:  Procedure Laterality Date  . CESAREAN SECTION    . CESAREAN SECTION N/A 05/18/2015   Procedure: CESAREAN SECTION;  Surgeon: Rubie Maid, MD;  Location: [facility] ORS;  Service: Obstetrics;  Laterality: N/A;  . NECK SURGERY     Fusion     Home Meds: Prior to Admission medications   Medication Sig Start Date End Date Taking? Authorizing Provider  amLODipine (NORVASC) 10 MG tablet Take 1 tablet (10 mg total) by mouth  daily. 07/29/17  Yes Epifanio Lesches, MD  carvedilol (COREG) 12.5 MG tablet Take 1 tablet (12.5 mg total) by mouth 2 (two) times daily with a meal. 07/28/17  Yes Epifanio Lesches, MD  hydrALAZINE (APRESOLINE) 25 MG tablet Take 1 tablet (25 mg total) by mouth every 8 (eight) hours. 07/28/17  Yes Epifanio Lesches, MD  feeding supplement, ENSURE ENLIVE, (ENSURE ENLIVE) LIQD Take 237 mLs by mouth 2 (two) times daily  between meals. 07/28/17   Epifanio Lesches, MD    Inpatient Medications:  . chlorhexidine gluconate (MEDLINE KIT)  15 mL Mouth Rinse BID  . heparin  5,000 Units Subcutaneous Q8H  .  HYDROmorphone (DILAUDID) injection  4 mg Intravenous STAT  . ipratropium-albuterol  3 mL Nebulization Q6H  . mouth rinse  15 mL Mouth Rinse QID   . sodium chloride 100 mL/hr at 08/16/17 0700  . famotidine (PEPCID) IV Stopped (08/16/17 0157)  . meropenem (MERREM) IV Stopped (08/16/17 0259)  . propofol (DIPRIVAN) infusion 50 mcg/kg/min (08/16/17 0700)    Allergies:  Allergies  Allergen Reactions  . Amoxicillin Rash  . Latex   . Vancomycin   . Clindamycin/Lincomycin Rash    Social History   Social History  . Marital status: Single    Spouse name: N/A  . Number of children: N/A  . Years of education: N/A   Occupational History  . Not on file.   Social History Main Topics  . Smoking status: Current Every Day Smoker    Packs/day: 1.00    Years: 7.00    Types: Cigarettes  . Smokeless tobacco: Never Used  . Alcohol use 0.0 oz/week     Comment: bootlegger x2  . Drug use: Yes    Types: Marijuana, Cocaine, Heroin, IV, Amphetamines, "Crack" cocaine     Comment: uses IV drugs, heroin  . Sexual activity: Yes    Birth control/ protection: None   Other Topics Concern  . Not on file   Social History Narrative   Lives at home with mother     Family History  Problem Relation Age of Onset  . Diabetes Mother   . COPD Mother   . Hypertension Mother   . Lung cancer Mother   . Alcohol abuse Father   . Lung cancer Maternal Grandmother      Review of Systems: Review of Systems  Unable to perform ROS: Intubated    Labs: No results for input(s): CKTOTAL, CKMB, TROPONINI in the last 72 hours. Lab Results  Component Value Date   WBC 6.0 08/16/2017   HGB 8.3 (L) 08/16/2017   HCT 24.0 (L) 08/16/2017   MCV 91.6 08/16/2017   PLT 190 08/16/2017    Recent Labs Lab 08/15/17 1804  08/16/17 0453  NA 140 141  K 4.3 3.7  CL 104 112*  CO2 20* 23  BUN 24* 20  CREATININE 1.93* 1.35*  CALCIUM 9.2 8.4*  PROT 8.0  --   BILITOT 0.8  --   ALKPHOS 64  --   ALT 18  --   AST 43*  --   GLUCOSE 74 85   Lab Results  Component Value Date   TRIG 95 08/15/2017   No results found for: DDIMER  Radiology/Studies:  Dg Abdomen 1 View  Result Date: 08/15/2017 CLINICAL DATA:  Line placement EXAM: ABDOMEN - 1 VIEW COMPARISON:  07/25/2017 FINDINGS: There is an enteric tube with tip in the left upper quadrant consistent with location in the upper stomach. Right femoral venous catheter with tip  overlying the L4 vertebra consistent with location in the distal inferior vena cava or proximal common iliac vein. Scattered gas and stool throughout the colon. No small or large bowel distention. No radiopaque stones. Visualized bones appear intact. IMPRESSION: Enteric tube and right femoral venous catheter appear in satisfactory location. Nonobstructing bowel gas pattern. Electronically Signed   By: Lucienne Capers M.D.   On: 08/15/2017 21:28   Dg Abd 1 View  Result Date: 07/25/2017 CLINICAL DATA:  Leukocytosis EXAM: ABDOMEN - 1 VIEW COMPARISON:  May 18, 2015 FINDINGS: There is a catheter in or overlying the pelvis. There is no appreciable bowel dilatation or air-fluid level to suggest bowel obstruction. No evident free air. Lung bases are clear. No abnormal calcifications are evident. IMPRESSION: No evident bowel obstruction or free air. Electronically Signed   By: Lowella Grip III M.D.   On: 07/25/2017 17:18   Ct Head Wo Contrast  Result Date: 08/15/2017 CLINICAL DATA:  Altered level of consciousness EXAM: CT HEAD WITHOUT CONTRAST TECHNIQUE: Contiguous axial images were obtained from the base of the skull through the vertex without intravenous contrast. COMPARISON:  07/25/2017, 07/16/2016 FINDINGS: Brain: No evidence of acute infarction, hemorrhage, hydrocephalus, extra-axial collection or  mass lesion/mass effect. Vascular: No hyperdense vessel or unexpected calcification. Skull: Normal. Negative for fracture or focal lesion. Sinuses/Orbits: Moderate mucosal thickening within the ethmoid, sphenoid and frontal sinuses. No acute orbital abnormality Other: None IMPRESSION: No CT evidence for acute intracranial abnormality.  Sinusitis Electronically Signed   By: Donavan Foil M.D.   On: 08/15/2017 20:01   Ct Head Wo Contrast  Result Date: 07/25/2017 CLINICAL DATA:  Acute encephalopathy secondary to opioid withdrawal. EXAM: CT HEAD WITHOUT CONTRAST TECHNIQUE: Contiguous axial images were obtained from the base of the skull through the vertex without intravenous contrast. COMPARISON:  July 16, 2016 FINDINGS: Brain: No evidence of acute infarction, hemorrhage, hydrocephalus, extra-axial collection or mass lesion/mass effect. Vascular: No hyperdense vessel or unexpected calcification. Skull: Normal. Negative for fracture or focal lesion. Sinuses/Orbits: There is mucoperiosteal thickening of bilateral ethmoid and left frontal sinuses. Other: None. IMPRESSION: No focal acute intracranial abnormality identified. Electronically Signed   By: Abelardo Diesel M.D.   On: 07/25/2017 15:43   Dg Chest Portable 1 View  Result Date: 08/15/2017 CLINICAL DATA:  Intubation, altered mental status. EXAM: PORTABLE CHEST 1 VIEW COMPARISON:  07/26/2017 and 07/25/2017 FINDINGS: Interval placement of nasogastric tube coursing into the stomach and off the inferior portion of the film as tip is not visualized. Placement of endotracheal tube with tip 2.1 cm above the carina along the right tracheal wall. Lungs are adequately inflated with opacification in the right base likely atelectasis, although cannot exclude infection. Cardiomediastinal silhouette and remainder of the exam is unchanged. IMPRESSION: New right base opacification likely atelectasis, although cannot exclude developing infection. Tubes and lines as  described. Electronically Signed   By: Marin Olp M.D.   On: 08/15/2017 19:11   Dg Chest Port 1 View  Result Date: 07/26/2017 CLINICAL DATA:  Respiratory failure. EXAM: PORTABLE CHEST 1 VIEW COMPARISON:  07/14/2017. FINDINGS: Interval removal of left IJ line. Mediastinum and hilar structures are normal. Lungs are clear. Heart size normal. No prominent pleural effusion. No pneumothorax. Cervical spine fusion. Metallic density noted over the left axilla. A small catheter, possibly of the catheter noted over the portion of the left upper extremity. IMPRESSION: Interval removal of left IJ line.  No acute cardiopulmonary disease. Electronically Signed   By: Marcello Moores  Register  On: 07/26/2017 06:22   Dg Chest Port 1 View  Result Date: 07/25/2017 CLINICAL DATA:  Leukocytosis. EXAM: PORTABLE CHEST 1 VIEW COMPARISON:  July 17, 2016 FINDINGS: Lungs are clear. Heart size and pulmonary vascularity are normal. No adenopathy. There is postoperative change in the lower cervical spine. IMPRESSION: No edema or consolidation. Electronically Signed   By: Lowella Grip III M.D.   On: 07/25/2017 17:17    EKG: Interpreted by me showed: NSR, 98 bpm, nonspecific inferior st/t changes Telemetry: Interpreted by me showed: NSR  Weights: Filed Weights   08/15/17 1750 08/15/17 2334  Weight: 121 lb (54.9 kg) 126 lb 8.7 oz (57.4 kg)     Physical Exam: Blood pressure 113/71, pulse 90, temperature 99.3 F (37.4 C), resp. rate 19, height 5' 4"  (1.626 m), weight 126 lb 8.7 oz (57.4 kg), SpO2 100 %. Body mass index is 21.72 kg/m. General: Ill appearing. Head: Normocephalic, atraumatic, sclera non-icteric, no xanthomas, nares are without discharge.  Neck: Negative for carotid bruits. JVD not elevated. Lungs: Diminished bilaterally, intubated. Heart: RRR with S1 S2. No murmurs, rubs, or gallops appreciated. Abdomen: Soft, non-tender, non-distended with normoactive bowel sounds. No hepatomegaly. No  rebound/guarding. No obvious abdominal masses. Msk:  Strength and tone appear normal for age. Extremities: No clubbing or cyanosis. No edema. Distal pedal pulses are 2+ and equal bilaterally. Neuro: Intubated and sedated. Psych:  Intubated and sedated.    Assessment and Plan:  Principal Problem:   Severe sepsis ([place]) Active Problems:   Polysubstance abuse ([place])   Obtundation   HCAP (healthcare-associated pneumonia)   Acute respiratory failure with hypoxia ([place])   Acute respiratory failure with hypoxia and hypercapnia (HCC)    1. Acute respiratory failure with hypoxia: -Intubated per PCCM  2. Recurrent sepsis: -Cultures negative in mid September and this admission to date -TEE to evaluate for subclinical bacterial endocarditis  -Will need consent  3. Polysubstance/IV drug abuse: -TEE as above   Signed, Christell Faith, PA-C [facility] Pager: [phone] 08/16/2017, 8:00 AM

## 2017-08-16 NOTE — Progress Notes (Signed)
*  PRELIMINARY RESULTS* Echocardiogram Echocardiogram Transesophageal has been performed.  Cristela Blue 08/16/2017, 9:50 AM

## 2017-08-16 NOTE — Progress Notes (Signed)
Pharmacy Antibiotic Note  [name] is a 36 y.o. female with a h/o IVDA admitted on 08/15/2017 with sepsis.  Pharmacy has been consulted for meropenem and daptomycin dosing. TEE negative for vegetation.   Plan: Will increase meropenem dosing to 1 g iv q 8 hours.   Will begin daptomycin 6 mg/kg iv q 24 hours.   Height:  (162.6 cm) Weight: 126 lb 8.7 oz (57.4 kg) IBW/kg (Calculated) : 54.7  Temp (24hrs), Avg:98.1 F (36.7 C), Min:95.7 F (35.4 C), Max:104.8 F (40.4 C)   Recent Labs Lab 08/15/17 1804 08/15/17 2059 08/16/17 0453  WBC 15.3*  --  6.0  CREATININE 1.93*  --  1.35*  LATICACIDVEN 6.1* 0.7  --     Estimated Creatinine Clearance: 50.7 mL/min (A) (by C-G formula based on SCr of 1.35 mg/dL (H)).    Allergies  Allergen Reactions  . Amoxicillin Rash  . Latex   . Vancomycin   . Clindamycin/Lincomycin Rash    Antimicrobials this admission: aztreonam 10/3 x 1 gentamicin 10/3 x 1 levofloxacin 10/3 x 1 Meropenem 10/4 >> Daptomycin 10/4 >>  Dose adjustments this admission:   Microbiology results: 10/3 BCx: pending 10/4 Sputum: pending  10/3 MRSA PCR: negative  Thank you for allowing pharmacy to be a part of this patient's care.  Luisa Hart D 08/16/2017 4:34 PM

## 2017-08-16 NOTE — Progress Notes (Signed)
Pt sister and father here to see. Pt is drowsy and not conversing much with family.  Drank 1 1/2 containers OJ without problem.  Says she is hungry.

## 2017-08-16 NOTE — Progress Notes (Signed)
    [facility] has been requested to perform a transesophageal echocardiogram on the patient for evaluation of possible bacterial endocaritis. Patient is intubated and sedated. I did call her sister, who did not answer. I called her father, [name], at [phone], and explained the risks and benefits of transesophageal echocardiogram including risks of esophageal damage, perforation (1:10,000 risk), bleeding, pharyngeal hematoma as well as other potential complications associated with conscious sedation including aspiration, arrhythmia, respiratory failure and death. Alternatives to treatment were discussed, questions were answered. The patient's father, [name], provided verbal consent for Dr. Kirke Corin, MD to perform a transesophageal echocardiogram.    Eula Listen, PA-C 08/16/2017 8:51 AM

## 2017-08-16 NOTE — Progress Notes (Signed)
Patient to remain in ICU/Stepdown unit currently resting in bed with eyes closed responds to verbal stimuli.

## 2017-08-16 NOTE — Progress Notes (Signed)
TEE was preformed at bedside and negative for vegetation on valves. Pt does have murmur. Monitors sinus tach on monitor.  Pt was extubated this am to room air. Sats good all day. Loose nonproductive cough at times. She has slept most of day. But has awakened to eat well and drink fluids. She is not very talkative with family or nursing staff. Her Renato Gails was here. Sister has been here much of day. Family is exhausted with her drug abuse and hope she is admitted to psych for further treatment before discharged.  Dr Cleda Daub was here just now to see.  And she is most interested in sleeping now. Repot was called to nurse on 1C. She will transfer out to 1C with night shift RN.

## 2017-08-16 NOTE — Progress Notes (Signed)
Able to follow some commands and nods yes/no. Lifts head off pillow and can cough forcefully. Did well with SBT. Dr Belia Heman in to see.  Pt extubated to room air
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
## 2017-08-16 NOTE — Progress Notes (Signed)
[employer] - [place] at [facility]   PATIENT NAME: [name]    MR#:  [id number]  DATE OF BIRTH:  November 14, 1980  SUBJECTIVE:   Patient sedated and intubated on vent  REVIEW OF SYSTEMS:    Intubated sedated   Tolerating Diet: npo      DRUG ALLERGIES:   Allergies  Allergen Reactions  . Amoxicillin Rash  . Latex   . Vancomycin   . Clindamycin/Lincomycin Rash    VITALS:  Blood pressure 132/81, pulse 89, temperature 99.1 F (37.3 C), resp. rate 18, height  (1.626 m), weight 57.4 kg (126 lb 8.7 oz), SpO2 100 %.  PHYSICAL EXAMINATION:  Constitutional: Appears well-developed and well-nourished. No distress. HENT: Normocephalic. .  Eyes: Conjunctivae are normal. PERRLA, no scleral icterus.  Neck: Normal ROM. Neck supple. No JVD. No tracheal deviation. CVS: RRR, S1/S2 +, 2/6 SEM, no gallops, no carotid bruit.  Pulmonary: Effort and breath sounds normal, no stridor, rhonchi, wheezes, rales.  Abdominal: Soft. BS +,  no distension, tenderness, rebound or guarding.  Musculoskeletal: No edema and no tenderness.  Neuro: Sedated on vent  Skin: Skin is warm and dry. No rash noted. Psychiatric: sedated    LABORATORY PANEL:   CBC  Recent Labs Lab 08/16/17 0453  WBC 6.0  HGB 8.3*  HCT 24.0*  PLT 190   ------------------------------------------------------------------------------------------------------------------  Chemistries   Recent Labs Lab 08/15/17 1804 08/16/17 0453  NA 140 141  K 4.3 3.7  CL 104 112*  CO2 20* 23  GLUCOSE 74 85  BUN 24* 20  CREATININE 1.93* 1.35*  CALCIUM 9.2 8.4*  AST 43*  --   ALT 18  --   ALKPHOS 64  --   BILITOT 0.8  --    ------------------------------------------------------------------------------------------------------------------  Cardiac Enzymes No results for input(s): TROPONINI in the last 168  hours. ------------------------------------------------------------------------------------------------------------------  RADIOLOGY:  Dg Abdomen 1 View  Result Date: 08/15/2017 CLINICAL DATA:  Line placement EXAM: ABDOMEN - 1 VIEW COMPARISON:  07/25/2017 FINDINGS: There is an enteric tube with tip in the left upper quadrant consistent with location in the upper stomach. Right femoral venous catheter with tip overlying the L4 vertebra consistent with location in the distal inferior vena cava or proximal common iliac vein. Scattered gas and stool throughout the colon. No small or large bowel distention. No radiopaque stones. Visualized bones appear intact. IMPRESSION: Enteric tube and right femoral venous catheter appear in satisfactory location. Nonobstructing bowel gas pattern. Electronically Signed   By: Burman Nieves M.D.   On: 08/15/2017 21:28   Ct Head Wo Contrast  Result Date: 08/15/2017 CLINICAL DATA:  Altered level of consciousness EXAM: CT HEAD WITHOUT CONTRAST TECHNIQUE: Contiguous axial images were obtained from the base of the skull through the vertex without intravenous contrast. COMPARISON:  07/25/2017, 07/16/2016 FINDINGS: Brain: No evidence of acute infarction, hemorrhage, hydrocephalus, extra-axial collection or mass lesion/mass effect. Vascular: No hyperdense vessel or unexpected calcification. Skull: Normal. Negative for fracture or focal lesion. Sinuses/Orbits: Moderate mucosal thickening within the ethmoid, sphenoid and frontal sinuses. No acute orbital abnormality Other: None IMPRESSION: No CT evidence for acute intracranial abnormality.  Sinusitis Electronically Signed   By: Jasmine Pang M.D.   On: 08/15/2017 20:01   Dg Chest Portable 1 View  Result Date: 08/15/2017 CLINICAL DATA:  Intubation, altered mental status. EXAM: PORTABLE CHEST 1 VIEW COMPARISON:  07/26/2017 and 07/25/2017 FINDINGS: Interval placement of nasogastric tube coursing into the stomach and off the inferior  portion of the film as  tip is not visualized. Placement of endotracheal tube with tip 2.1 cm above the carina along the right tracheal wall. Lungs are adequately inflated with opacification in the right base likely atelectasis, although cannot exclude infection. Cardiomediastinal silhouette and remainder of the exam is unchanged. IMPRESSION: New right base opacification likely atelectasis, although cannot exclude developing infection. Tubes and lines as described. Electronically Signed   By: Elberta Fortis M.D.   On: 08/15/2017 19:11     ASSESSMENT AND PLAN:   36 year old female with a history of polysubstance abuse who was started up tended brought to the ER for further evaluation.  1. Acute respiratory failure with hypoxia due to inability to protect airway Patient is intubated Ventilator management as per intensivist. Continue propofol for sedation  2. Sepsis due to right lower lung pneumonia Continue meropenem Blood cultures thus far are negative  3. Heart murmur: Patient underwent TEE which was negative for endocarditis  4. Polysubstance abuse  5. Acute metabolic encephalopathy in the setting of polysubstance abuse Follow up on EEG  6. Acute anemia: Follow hemoglobin and transfuse if hemoglobin less than 7. Consider anemia panel.  D/w cardiology  CODE STATUS: full  TOTAL TIME TAKING CARE OF THIS PATIENT: 33 minutes.     POSSIBLE D/C 2-4 days, DEPENDING ON CLINICAL CONDITION.   Taylor Spilde M.D on 08/16/2017 at 9:45 AM  Between 7am to 6pm - Pager - [phone] After 6pm go to [email or web address] - password [email or web address]  [facility] [place] [facility]  Office  [phone]  CC: Primary care physician; Patient, No Pcp Per  Note: This dictation was prepared with Dragon dictation along with smaller phrase technology. Any transcriptional errors that result from this process are unintentional.

## 2017-08-16 NOTE — Progress Notes (Signed)
Extubated per MD order without complications 

## 2017-08-17 ENCOUNTER — Inpatient Hospital Stay: Payer: Self-pay

## 2017-08-17 DIAGNOSIS — A419 Sepsis, unspecified organism: Secondary | ICD-10-CM

## 2017-08-17 LAB — PROCALCITONIN: Procalcitonin: 8.85 ng/mL

## 2017-08-17 MED ORDER — SODIUM CHLORIDE 0.9 % IV SOLN
1.0000 g | Freq: Three times a day (TID) | INTRAVENOUS | Status: DC
  Administered 2017-08-17 – 2017-08-18 (×3): 1 g via INTRAVENOUS
  Filled 2017-08-17 (×6): qty 1

## 2017-08-17 MED ORDER — AMLODIPINE BESYLATE 10 MG PO TABS
10.0000 mg | ORAL_TABLET | Freq: Every day | ORAL | Status: DC
  Administered 2017-08-17 – 2017-08-18 (×2): 10 mg via ORAL
  Filled 2017-08-17 (×2): qty 1

## 2017-08-17 MED ORDER — INFLUENZA VAC SPLIT QUAD 0.5 ML IM SUSY: 0.5000 mL | PREFILLED_SYRINGE | INTRAMUSCULAR | Status: DC

## 2017-08-17 MED ORDER — SODIUM CHLORIDE 0.9 % IV SOLN
INTRAVENOUS | Status: AC
  Administered 2017-08-18: 02:00:00 via INTRAVENOUS

## 2017-08-17 MED ORDER — FAMOTIDINE 20 MG PO TABS
20.0000 mg | ORAL_TABLET | Freq: Every day | ORAL | Status: DC
  Administered 2017-08-18: 20 mg via ORAL
  Filled 2017-08-17: qty 1

## 2017-08-17 MED ORDER — HALOPERIDOL LACTATE 5 MG/ML IJ SOLN: 5.0000 mg | Freq: Four times a day (QID) | INTRAMUSCULAR | Status: DC | PRN

## 2017-08-17 NOTE — Progress Notes (Addendum)
[facility] Medicine Progess Note    SYNOPSIS   36 yo female with PMH Tenosynovitis, IV drug use, MRSA, HSV2, Hepatitis C, Asthma, and DVT of Arm.  She presented to [facility] ER 10/3 via EMS and with police present due to altered mental status and severe agitation secondary to drug overdose.    ASSESSMENT/PLAN   Polysubstance abuse, urine drug screen was positive for amphetamine, cocaine, opiates, cannabinoids and benzodiazepine. Presently being followed by psychiatry for disposition. Will transfer to the floor with a sitter pending psychiatric disposition.  Patient with negative blood cultures, negative transesophageal echo and white count normal, will DC antibiotics   Name: [name] MRN: [id number] DOB: [birth year]    ADMISSION DATE:  08/15/2017   SUBJECTIVE:   Patient awake and alert and communicating this morning her states she is feeling okay. Pending psychiatric consultation.Marland Kitchen   VITAL SIGNS: Temp:  [98.2 F (36.8 C)-101.3 F (38.5 C)] 99.9 F (37.7 C) (10/05 0800) Pulse Rate:  [87-116] 96 (10/05 0800) Resp:  [17-37] 26 (10/05 0800) BP: (115-148)/(44-110) 128/110 (10/05 0800) SpO2:  [96 %-100 %] 99 % (10/05 0800)  PHYSICAL EXAMINATION: Physical Examination:   VS: BP (!) 128/110   Pulse 96   Temp 99.9 F (37.7 C)   Resp (!) 26   Ht  (1.626 m)   Wt 57.4 kg (126 lb 8.7 oz)   SpO2 99%   BMI 21.72 kg/m   General Appearance: No distress  Neuro:without focal findings, mental status normal. HEENT: PERRLA, EOM intact. Abrasions and ecchymosis noted over right side of her face Pulmonary: normal breath sounds   CardiovascularNormal S1,S2.  No m/r/g.   Abdomen: Benign, Soft, non-tender. Skin:   warm, no rashes, no ecchymosis  Extremities: normal, no cyanosis, clubbing.    LABORATORY PANEL:   CBC  Recent Labs Lab 08/16/17 0453  WBC 6.0  HGB 8.3*  HCT 24.0*  PLT 190    Chemistries   Recent Labs Lab 08/15/17 1804 08/16/17 0453    NA 140 141  K 4.3 3.7  CL 104 112*  CO2 20* 23  GLUCOSE 74 85  BUN 24* 20  CREATININE 1.93* 1.35*  CALCIUM 9.2 8.4*  AST 43*  --   ALT 18  --   ALKPHOS 64  --   BILITOT 0.8  --      Recent Labs Lab 08/15/17 2330  GLUCAP 102*    Recent Labs Lab 08/15/17 2359  PHART 7.38  PCO2ART 37  PO2ART 186*    Recent Labs Lab 08/15/17 1804  AST 43*  ALT 18  ALKPHOS 64  BILITOT 0.8  ALBUMIN 4.1    Cardiac Enzymes No results for input(s): TROPONINI in the last 168 hours.  RADIOLOGY:  Dg Abd 1 View  Result Date: 08/16/2017 CLINICAL DATA:  Nasogastric placement. EXAM: ABDOMEN - 1 VIEW COMPARISON:  08/15/2017 FINDINGS: Nasogastric tube enters the stomach in has its tip in the mid body. Bowel gas pattern is normal. Right femoral catheter in place. IMPRESSION: Nasogastric tube tip in the mid body of the stomach. Electronically Signed   By: Paulina Fusi M.D.   On: 08/16/2017 10:44   Dg Abdomen 1 View  Result Date: 08/15/2017 CLINICAL DATA:  Line placement EXAM: ABDOMEN - 1 VIEW COMPARISON:  07/25/2017 FINDINGS: There is an enteric tube with tip in the left upper quadrant consistent with location in the upper stomach. Right femoral venous catheter with tip overlying the L4 vertebra consistent with location in the  distal inferior vena cava or proximal common iliac vein. Scattered gas and stool throughout the colon. No small or large bowel distention. No radiopaque stones. Visualized bones appear intact. IMPRESSION: Enteric tube and right femoral venous catheter appear in satisfactory location. Nonobstructing bowel gas pattern. Electronically Signed   By: Burman Nieves M.D.   On: 08/15/2017 21:28   Ct Head Wo Contrast  Result Date: 08/15/2017 CLINICAL DATA:  Altered level of consciousness EXAM: CT HEAD WITHOUT CONTRAST TECHNIQUE: Contiguous axial images were obtained from the base of the skull through the vertex without intravenous contrast. COMPARISON:  07/25/2017, 07/16/2016  FINDINGS: Brain: No evidence of acute infarction, hemorrhage, hydrocephalus, extra-axial collection or mass lesion/mass effect. Vascular: No hyperdense vessel or unexpected calcification. Skull: Normal. Negative for fracture or focal lesion. Sinuses/Orbits: Moderate mucosal thickening within the ethmoid, sphenoid and frontal sinuses. No acute orbital abnormality Other: None IMPRESSION: No CT evidence for acute intracranial abnormality.  Sinusitis Electronically Signed   By: Jasmine Pang M.D.   On: 08/15/2017 20:01   Dg Chest Portable 1 View  Result Date: 08/15/2017 CLINICAL DATA:  Intubation, altered mental status. EXAM: PORTABLE CHEST 1 VIEW COMPARISON:  07/26/2017 and 07/25/2017 FINDINGS: Interval placement of nasogastric tube coursing into the stomach and off the inferior portion of the film as tip is not visualized. Placement of endotracheal tube with tip 2.1 cm above the carina along the right tracheal wall. Lungs are adequately inflated with opacification in the right base likely atelectasis, although cannot exclude infection. Cardiomediastinal silhouette and remainder of the exam is unchanged. IMPRESSION: New right base opacification likely atelectasis, although cannot exclude developing infection. Tubes and lines as described. Electronically Signed   By: Elberta Fortis M.D.   On: 08/15/2017 19:11   Tora Kindred, DO 08/17/2017

## 2017-08-17 NOTE — Progress Notes (Addendum)
Step-mother Wynona Canes was in to visit. She states Patriciaann told her  "Why didn't they just leave me in that car to die?" " I just want to die".  She is getting progressively agitated. Wanting cell phone and belongings.  Dr Toni Amend paged.  Haldol  IV. Pt father, sister and step mother all state to me that, " Wynona Canes needs Involuntary Committment or whatever it takes".  they "fear for her life."

## 2017-08-17 NOTE — Progress Notes (Signed)
IVC papers to front of chart. Safety sitter at bedside.

## 2017-08-17 NOTE — Consult Note (Signed)
[facility] Consult   Reason for Consult:  Consult for 36 year old woman with history of drug abuse who once again is brought to the emergency room with substance-induced delirium Referring Physician:  Mody Patient Identification: NATALIYAH PACKHAM MRN:  [id number] Principal Diagnosis: Delirium, drug-induced [facility]) Diagnosis:   Patient Active Problem List   Diagnosis Date Noted  . Drug overdose [T50.901A]   . HCAP (healthcare-associated pneumonia) [J18.9] 08/15/2017  . Acute respiratory failure with hypoxia ([place]) [J96.01] 08/15/2017  . Acute respiratory failure with hypoxia and hypercapnia (HCC) [J96.01, J96.02] 08/15/2017  . Acute renal failure ([place]) [N17.9]   . Altered mental status [R41.82] 07/24/2017  . Delirium, drug-induced ([place]) [N02.725] 07/24/2017  . Opiate abuse, continuous ([place]) [F11.10] 07/24/2017  . Amphetamine abuse ([place]) [F15.10] 07/24/2017  . Severe sepsis ([place]) [A41.9, R65.20] 07/16/2016  . AKI (acute kidney injury) ([place]) [N17.9] 07/16/2016  . Obtundation [R40.1] 07/16/2016  . Elevated lactic acid level [R79.89] 07/16/2016  . 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 Elevated LFTs [R94.5] 07/16/2016  . Sepsis ([place]) [A41.9] 07/10/2015  . Oligohydramnios [O41.00X0] 05/18/2015  . S/P cesarean section [Z98.891] 05/18/2015  . Preterm uterine contractions [O47.9] 05/17/2015  . Oligohydramnios antepartum [O41.00X0] 05/17/2015  . H/O cesarean section complicating pregnancy [D66.440] 05/17/2015  . Preterm contractions [O47.9] 05/17/2015  . AA (alcohol abuse) [F10.10] 12/08/2014  . Drug abuse during pregnancy [facility]) [H47.425, F19.10] 12/08/2014  . High risk sexual behavior [Z72.51] 12/08/2014  . Tenosynovitis [M65.9] 08/19/2014  . Acute deep vein thrombosis of arm (HCC) [I82.629] 08/19/2014  . HCV (hepatitis C virus) [B19.20] 07/17/2013  . H/O cesarean section [Z98.891] 05/18/2013  . Herpes infection in pregnancy [O98.519, B00.9] 05/18/2013  . Infection  with methicillin-resistant Staphylococcus aureus [A49.02] 03/19/2013  . Cannabis abuse [F12.10] 03/18/2013  . Polysubstance abuse ([place]) [F19.10] 03/18/2013  . Compulsive tobacco user syndrome [F17.200] 03/17/2013    Total Time spent with patient: 30 minutes  Subjective:   Arlisa LUNA AUDIA is a 36 y.o. female patient admitted with Patient not able to give any history.  Follow-up for Friday the fifth. Patient reassessed. Today she is awake and at least partially interactive. She says she is having pain in her face and the obvious areas but denies any other physical symptoms. Denies nausea or vomiting or myalgias or any sense of having drug withdrawal. Patient says that she can't remember the events leading to coming to the hospital except that she was in a car that's the last thing she remembers. Patient admits her mood is been bad. She has been continuing to use heroin amphetamines cocaine and other drugs. She admits that she has had thoughts about wanting to die. Patient is only intermittently cooperative in the intensive care unit although she claims that she wants some help. She does not appear to be frankly delirious although  HPI:  Chart reviewed. Spoke with nursing. Briefly attempted to interview patient. 36 year old woman brought to the emergency room after being found agitated and out of control in a car in public. Patient was delirious and agitated in the emergency room. Now she is in the intensive care unit being stabilized. Drug screen on admission was positive for amphetamines cocaine and opiates once again. Patient is not able to give any history. She grunts only a little bit when I came to see her. Patient was seen about 3 weeks ago under almost identical circumstances. Not currently receiving outpatient mental healthcare.  Social history: Unknown exactly what her situation is. Family have been present in  the intensive care unit and have expressed concern about her.  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  Substance abuse history: Extensive history of abuse of heroin and now apparently methamphetamine and cocaine. History of intravenous drug abuse. Has been to substance abuse treatment programs but it doesn't seem that she's been able to maintain much sobriety. Physical condition appears to be worsening.  Past Psychiatric History: Details lacking. Doesn't appear that she's had psychiatric inpatient treatment. Has had substance abuse treatment according to some of the older Notes. No history of any other psychiatric diagnosis besides the substance abuse  Risk to Self: Is patient at risk for suicide?: No (Unknown) Risk to Others:   Prior Inpatient Therapy:   Prior Outpatient Therapy:    Past Medical History:  Past Medical History:  Diagnosis Date  . Acute deep vein thrombosis of arm (HCC) 08/19/2014   Last Assessment & Plan:  - diagnosed 08/2014 in hospital - s/p heparin gtt - patient never took her xarelto x 3 month course that was planned - no symptoms today.  No further treatment - we discussed avoiding IVDU (see below) to reduce her chances of provoked upper extremity DVT   . Asthma   . HCV (hepatitis C virus) 07/17/2013   Last Assessment & Plan:  - positive antibody most recently 08/2014 - hep C RNA 08/2014 was negative - prior to that, HCV RNA 737366 in 06/2013. No record for genotype or imaging.   - patient counseled that she could become re-infected with continued IVDU or unprotected sexual encounters.   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   Last Assessment & Plan:  Discussed routine use of Valtrex prophylaxis at 36 weeks. She will be delivered by cesarean at 36-37 weeks.   . Infection with methicillin-resistant Staphylococcus aureus 03/19/2013   Overview: Arm abscess with MRSA, treated.  Subsequent screening negative  . IV drug abuse ([place])   .  Tenosynovitis 08/19/2014    Past Surgical History:  Procedure Laterality Date  . CESAREAN SECTION    . CESAREAN SECTION N/A 05/18/2015   Procedure: CESAREAN SECTION;  Surgeon: Rubie Maid, MD;  Location: [facility] ORS;  Service: Obstetrics;  Laterality: N/A;  . NECK SURGERY     Fusion   Family History:  Family History  Problem Relation Age of Onset  . Diabetes Mother   . COPD Mother   . Hypertension Mother   . Lung cancer Mother   . Alcohol abuse Father   . Lung cancer Maternal Grandmother    Family Psychiatric  History: Unknown Social History:  History  Alcohol Use  . 0.0 oz/week    Comment: bootlegger x2     History  Drug Use  . Types: Marijuana, Cocaine, Heroin, IV, Amphetamines, "Crack" cocaine    Comment: uses IV drugs, heroin    Social History   Social History  . Marital status: Single    Spouse name: N/A  . Number of children: N/A  . Years of education: N/A   Social History Main Topics  . Smoking status: Current Every Day Smoker    Packs/day: 1.00    Years: 7.00    Types: Cigarettes  . Smokeless tobacco: Never Used  . Alcohol use 0.0 oz/week     Comment: bootlegger x2  . Drug use: Yes    Types: Marijuana, Cocaine, Heroin, IV, Amphetamines, "Crack" cocaine  Comment: uses IV drugs, heroin  . Sexual activity: Yes    Birth control/ protection: None   Other Topics Concern  . None   Social History Narrative   Lives at home with mother   Additional Social History:    Allergies:   Allergies  Allergen Reactions  . Amoxicillin Rash  . Latex   . Vancomycin   . Clindamycin/Lincomycin Rash    Labs:  Results for orders placed or performed during the hospital encounter of 08/15/17 (from the past 48 hour(s))  Blood gas, venous     Status: Abnormal   Collection Time: 08/15/17  5:55 PM  Result Value Ref Range   pH, Ven 7.26 7.250 - 7.430   pCO2, Ven 45 44.0 - 60.0 mmHg   pO2, Ven 384.0 (H) 32.0 - 45.0 mmHg   Bicarbonate 20.2 20.0 - 28.0 mmol/L    Acid-base deficit 6.5 (H) 0.0 - 2.0 mmol/L   O2 Saturation 99.9 %   Patient temperature 37.0    Collection site VEIN    Sample type VEIN   CBC with Differential     Status: Abnormal   Collection Time: 08/15/17  6:04 PM  Result Value Ref Range   WBC 15.3 (H) 3.6 - 11.0 K/uL   RBC 2.83 (L) 3.80 - 5.20 MIL/uL   Hemoglobin 8.7 (L) 12.0 - 16.0 g/dL   HCT 25.8 (L) 35.0 - 47.0 %   MCV 91.1 80.0 - 100.0 fL   MCH 30.8 26.0 - 34.0 pg   MCHC 33.9 32.0 - 36.0 g/dL   RDW 15.6 (H) 11.5 - 14.5 %   Platelets 294 150 - 440 K/uL   Neutrophils Relative % 62 %   Neutro Abs 9.5 (H) 1.4 - 6.5 K/uL   Lymphocytes Relative 25 %   Lymphs Abs 3.8 (H) 1.0 - 3.6 K/uL   Monocytes Relative 8 %   Monocytes Absolute 1.3 (H) 0.2 - 0.9 K/uL   Eosinophils Relative 4 %   Eosinophils Absolute 0.6 0 - 0.7 K/uL   Basophils Relative 1 %   Basophils Absolute 0.1 0 - 0.1 K/uL  Comprehensive metabolic panel     Status: Abnormal   Collection Time: 08/15/17  6:04 PM  Result Value Ref Range   Sodium 140 135 - 145 mmol/L   Potassium 4.3 3.5 - 5.1 mmol/L   Chloride 104 101 - 111 mmol/L   CO2 20 (L) 22 - 32 mmol/L   Glucose, Bld 74 65 - 99 mg/dL   BUN 24 (H) 6 - 20 mg/dL   Creatinine, Ser 1.93 (H) 0.44 - 1.00 mg/dL   Calcium 9.2 8.9 - 10.3 mg/dL   Total Protein 8.0 6.5 - 8.1 g/dL   Albumin 4.1 3.5 - 5.0 g/dL   AST 43 (H) 15 - 41 U/L   ALT 18 14 - 54 U/L   Alkaline Phosphatase 64 38 - 126 U/L   Total Bilirubin 0.8 0.3 - 1.2 mg/dL   GFR calc non Af Amer 33 (L) >60 mL/min   GFR calc Af Amer 38 (L) >60 mL/min    Comment: (NOTE) The eGFR has been calculated using the CKD EPI equation. This calculation has not been validated in all clinical situations. eGFR's persistently <60 mL/min signify possible Chronic Kidney Disease.    Anion gap 16 (H) 5 - 15  Lactic acid, plasma     Status: Abnormal   Collection Time: 08/15/17  6:04 PM  Result Value Ref Range   Lactic Acid, Venous 6.1 (  HH) 0.5 - 1.9 mmol/L    Comment:  CRITICAL RESULT CALLED TO, READ BACK BY AND VERIFIED WITH REBECCA UHORCHUK 08/15/17 1908 KLW   Acetaminophen level     Status: Abnormal   Collection Time: 08/15/17  6:04 PM  Result Value Ref Range   Acetaminophen (Tylenol), Serum <10 (L) 10 - 30 ug/mL    Comment:        THERAPEUTIC CONCENTRATIONS VARY SIGNIFICANTLY. A RANGE OF 10-30 ug/mL MAY BE AN EFFECTIVE CONCENTRATION FOR MANY PATIENTS. HOWEVER, SOME ARE BEST TREATED AT CONCENTRATIONS OUTSIDE THIS RANGE. ACETAMINOPHEN CONCENTRATIONS >150 ug/mL AT 4 HOURS AFTER INGESTION AND >50 ug/mL AT 12 HOURS AFTER INGESTION ARE OFTEN ASSOCIATED WITH TOXIC REACTIONS.   Salicylate level     Status: None   Collection Time: 08/15/17  6:04 PM  Result Value Ref Range   Salicylate Lvl <0.2 2.8 - 30.0 mg/dL  Osmolality     Status: Abnormal   Collection Time: 08/15/17  6:04 PM  Result Value Ref Range   Osmolality 297 (H) 275 - 295 mOsm/kg  Urinalysis, Complete w Microscopic     Status: Abnormal   Collection Time: 08/15/17  6:06 PM  Result Value Ref Range   Color, Urine YELLOW (A) YELLOW   APPearance HAZY (A) CLEAR   Specific Gravity, Urine 1.015 1.005 - 1.030   pH 5.0 5.0 - 8.0   Glucose, UA NEGATIVE NEGATIVE mg/dL   Hgb urine dipstick NEGATIVE NEGATIVE   Bilirubin Urine NEGATIVE NEGATIVE   Ketones, ur NEGATIVE NEGATIVE mg/dL   Protein, ur 30 (A) NEGATIVE mg/dL   Nitrite NEGATIVE NEGATIVE   Leukocytes, UA NEGATIVE NEGATIVE   RBC / HPF 0-5 0 - 5 RBC/hpf   WBC, UA 0-5 0 - 5 WBC/hpf   Bacteria, UA NONE SEEN NONE SEEN   Squamous Epithelial / LPF NONE SEEN NONE SEEN   Mucus PRESENT    Hyaline Casts, UA PRESENT   Urine Drug Screen, Qualitative ([facility] only)     Status: Abnormal   Collection Time: 08/15/17  6:06 PM  Result Value Ref Range   Tricyclic, Ur Screen NONE DETECTED NONE DETECTED   Amphetamines, Ur Screen POSITIVE (A) NONE DETECTED   MDMA (Ecstasy)Ur Screen NONE DETECTED NONE DETECTED   Cocaine Metabolite,Ur [place] POSITIVE (A) NONE  DETECTED   Opiate, Ur Screen POSITIVE (A) NONE DETECTED   Phencyclidine (PCP) Ur S NONE DETECTED NONE DETECTED   Cannabinoid 50 Ng, Ur [place] POSITIVE (A) NONE DETECTED   Barbiturates, Ur Screen NONE DETECTED NONE DETECTED   Benzodiazepine, Ur Scrn POSITIVE (A) NONE DETECTED   Methadone Scn, Ur NONE DETECTED NONE DETECTED    Comment: (NOTE) 542  Tricyclics, urine               Cutoff 1000 ng/mL 200  Amphetamines, urine             Cutoff 1000 ng/mL 300  MDMA (Ecstasy), urine           Cutoff 500 ng/mL 400  Cocaine Metabolite, urine       Cutoff 300 ng/mL 500  Opiate, urine                   Cutoff 300 ng/mL 600  Phencyclidine (PCP), urine      Cutoff 25 ng/mL 700  Cannabinoid, urine              Cutoff 50 ng/mL 800  Barbiturates, urine  Cutoff 200 ng/mL 900  Benzodiazepine, urine           Cutoff 200 ng/mL 1000 Methadone, urine                Cutoff 300 ng/mL 1100 1200 The urine drug screen provides only a preliminary, unconfirmed 1300 analytical test result and should not be used for non-medical 1400 purposes. Clinical consideration and professional judgment should 1500 be applied to any positive drug screen result due to possible 1600 interfering substances. A more specific alternate chemical method 1700 must be used in order to obtain a confirmed analytical result.  1800 Gas chromato graphy / mass spectrometry (GC/MS) is the preferred 1900 confirmatory method.   Pregnancy, urine POC     Status: None   Collection Time: 08/15/17  6:23 PM  Result Value Ref Range   Preg Test, Ur NEGATIVE NEGATIVE    Comment:        THE SENSITIVITY OF THIS METHODOLOGY IS >24 mIU/mL   Lactic acid, plasma     Status: None   Collection Time: 08/15/17  8:59 PM  Result Value Ref Range   Lactic Acid, Venous 0.7 0.5 - 1.9 mmol/L  Culture, blood (routine x 2)     Status: None (Preliminary result)   Collection Time: 08/15/17  8:59 PM  Result Value Ref Range   Specimen Description BLOOD RFOA      Special Requests      BOTTLES DRAWN AEROBIC AND ANAEROBIC Blood Culture adequate volume   Culture NO GROWTH 2 DAYS    Report Status PENDING   Culture, blood (routine x 2)     Status: None (Preliminary result)   Collection Time: 08/15/17  8:59 PM  Result Value Ref Range   Specimen Description BLOOD RT CVC    Special Requests      BOTTLES DRAWN AEROBIC AND ANAEROBIC Blood Culture adequate volume   Culture NO GROWTH 2 DAYS    Report Status PENDING   MRSA PCR Screening     Status: None   Collection Time: 08/15/17 11:22 PM  Result Value Ref Range   MRSA by PCR NEGATIVE NEGATIVE    Comment:        The GeneXpert MRSA Assay (FDA approved for NASAL specimens only), is one component of a comprehensive MRSA colonization surveillance program. It is not intended to diagnose MRSA infection nor to guide or monitor treatment for MRSA infections.   Glucose, capillary     Status: Abnormal   Collection Time: 08/15/17 11:30 PM  Result Value Ref Range   Glucose-Capillary 102 (H) 65 - 99 mg/dL  Triglycerides     Status: None   Collection Time: 08/15/17 11:51 PM  Result Value Ref Range   Triglycerides 95 <150 mg/dL  Procalcitonin     Status: None   Collection Time: 08/15/17 11:51 PM  Result Value Ref Range   Procalcitonin 8.14 ng/mL    Comment:        Interpretation: PCT > 2 ng/mL: Systemic infection (sepsis) is likely, unless other causes are known. (NOTE)         ICU PCT Algorithm               Non ICU PCT Algorithm    ----------------------------     ------------------------------         PCT < 0.25 ng/mL                 PCT < 0.1 ng/mL     Stopping of  antibiotics            Stopping of antibiotics       strongly encouraged.               strongly encouraged.    ----------------------------     ------------------------------       PCT level decrease by               PCT < 0.25 ng/mL       >= 80% from peak PCT       OR PCT 0.25 - 0.5 ng/mL          Stopping of antibiotics                                              encouraged.     Stopping of antibiotics           encouraged.    ----------------------------     ------------------------------       PCT level decrease by              PCT >= 0.25 ng/mL       < 80% from peak PCT        AND PCT >= 0.5 ng/mL            Continuing antibiotics                                               encouraged.       Continuing antibiotics            encouraged.    ----------------------------     ------------------------------     PCT level increase compared          PCT > 0.5 ng/mL         with peak PCT AND          PCT >= 0.5 ng/mL             Escalation of antibiotics                                          strongly encouraged.      Escalation of antibiotics        strongly encouraged.   Blood gas, arterial     Status: Abnormal   Collection Time: 08/15/17 11:59 PM  Result Value Ref Range   FIO2 0.40    Delivery systems VENTILATOR    Mode PRESSURE REGULATED VOLUME CONTROL    VT 500 mL   LHR 16 resp/min   Peep/cpap 5.0 cm H20   pH, Arterial 7.38 7.350 - 7.450   pCO2 arterial 37 32.0 - 48.0 mmHg   pO2, Arterial 186 (H) 83.0 - 108.0 mmHg   Bicarbonate 21.9 20.0 - 28.0 mmol/L   Acid-base deficit 2.8 (H) 0.0 - 2.0 mmol/L   O2 Saturation 99.6 %   Patient temperature 37.0    Collection site RIGHT RADIAL    Sample type ARTERIAL DRAW    Allens test (pass/fail) PASS PASS   Mechanical Rate 16   Culture, respiratory (NON-Expectorated)     Status: None (Preliminary result)  Collection Time: 08/16/17  3:23 AM  Result Value Ref Range   Specimen Description TRACHEAL ASPIRATE    Special Requests NONE    Gram Stain      FEW WBC PRESENT,BOTH PMN AND MONONUCLEAR SQUAMOUS EPITHELIAL CELLS PRESENT ABUNDANT GRAM POSITIVE COCCI ABUNDANT GRAM NEGATIVE RODS FEW GRAM POSITIVE RODS    Culture      CULTURE REINCUBATED FOR BETTER GROWTH Performed at [facility], [place] [address], [place], [place] [address]    Report Status  PENDING   Procalcitonin     Status: None   Collection Time: 08/16/17  4:53 AM  Result Value Ref Range   Procalcitonin 17.64 ng/mL    Comment:        Interpretation: PCT >= 10 ng/mL: Important systemic inflammatory response, almost exclusively due to severe bacterial sepsis or septic shock. (NOTE)         ICU PCT Algorithm               Non ICU PCT Algorithm    ----------------------------     ------------------------------         PCT < 0.25 ng/mL                 PCT < 0.1 ng/mL     Stopping of antibiotics            Stopping of antibiotics       strongly encouraged.               strongly encouraged.    ----------------------------     ------------------------------       PCT level decrease by               PCT < 0.25 ng/mL       >= 80% from peak PCT       OR PCT 0.25 - 0.5 ng/mL          Stopping of antibiotics                                             encouraged.     Stopping of antibiotics           encouraged.    ----------------------------     ------------------------------       PCT level decrease by              PCT >= 0.25 ng/mL       < 80% from peak PCT        AND PCT >= 0.5 ng/mL             Continuing antibiotics                                              encouraged.       Continuing antibiotics            encouraged.    ----------------------------     ------------------------------     PCT level increase compared          PCT > 0.5 ng/mL         with peak PCT AND          PCT >= 0.5 ng/mL  Escalation of antibiotics                                          strongly encouraged.      Escalation of antibiotics        strongly encouraged.   Basic metabolic panel     Status: Abnormal   Collection Time: 08/16/17  4:53 AM  Result Value Ref Range   Sodium 141 135 - 145 mmol/L   Potassium 3.7 3.5 - 5.1 mmol/L   Chloride 112 (H) 101 - 111 mmol/L   CO2 23 22 - 32 mmol/L   Glucose, Bld 85 65 - 99 mg/dL   BUN 20 6 - 20 mg/dL   Creatinine, Ser 1.35 (H)  0.44 - 1.00 mg/dL   Calcium 8.4 (L) 8.9 - 10.3 mg/dL   GFR calc non Af Amer 51 (L) >60 mL/min   GFR calc Af Amer 59 (L) >60 mL/min    Comment: (NOTE) The eGFR has been calculated using the CKD EPI equation. This calculation has not been validated in all clinical situations. eGFR's persistently <60 mL/min signify possible Chronic Kidney Disease.    Anion gap 6 5 - 15  CBC     Status: Abnormal   Collection Time: 08/16/17  4:53 AM  Result Value Ref Range   WBC 6.0 3.6 - 11.0 K/uL   RBC 2.62 (L) 3.80 - 5.20 MIL/uL   Hemoglobin 8.3 (L) 12.0 - 16.0 g/dL   HCT 24.0 (L) 35.0 - 47.0 %   MCV 91.6 80.0 - 100.0 fL   MCH 31.6 26.0 - 34.0 pg   MCHC 34.5 32.0 - 36.0 g/dL   RDW 16.0 (H) 11.5 - 14.5 %   Platelets 190 150 - 440 K/uL  Vitamin B12     Status: None   Collection Time: 08/16/17 10:24 AM  Result Value Ref Range   Vitamin B-12 295 180 - 914 pg/mL    Comment: (NOTE) This assay is not validated for testing neonatal or myeloproliferative syndrome specimens for Vitamin B12 levels. Performed at [facility], [place] [address] [place], [place] [address]   Folate     Status: None   Collection Time: 08/16/17 10:24 AM  Result Value Ref Range   Folate 15.9 >5.9 ng/mL  Iron and TIBC     Status: None   Collection Time: 08/16/17 10:24 AM  Result Value Ref Range   Iron 43 28 - 170 ug/dL   TIBC 317 250 - 450 ug/dL   Saturation Ratios 14 10.4 - 31.8 %   UIBC 274 ug/dL  Ferritin     Status: None   Collection Time: 08/16/17 10:24 AM  Result Value Ref Range   Ferritin 108 11 - 307 ng/mL  Reticulocytes     Status: Abnormal   Collection Time: 08/16/17 10:24 AM  Result Value Ref Range   Retic Ct Pct 1.2 0.4 - 3.1 %   RBC. 2.71 (L) 3.80 - 5.20 MIL/uL   Retic Count, Absolute 32.5 19.0 - 183.0 K/uL  CK     Status: Abnormal   Collection Time: 08/16/17  7:03 PM  Result Value Ref Range   Total CK 2,444 (H) 38 - 234 U/L  Procalcitonin     Status: None   Collection Time: 08/17/17  5:17 AM    Result Value Ref Range   Procalcitonin 8.85 ng/mL  Comment:        Interpretation: PCT > 2 ng/mL: Systemic infection (sepsis) is likely, unless other causes are known. (NOTE)         ICU PCT Algorithm               Non ICU PCT Algorithm    ----------------------------     ------------------------------         PCT < 0.25 ng/mL                 PCT < 0.1 ng/mL     Stopping of antibiotics            Stopping of antibiotics       strongly encouraged.               strongly encouraged.    ----------------------------     ------------------------------       PCT level decrease by               PCT < 0.25 ng/mL       >= 80% from peak PCT       OR PCT 0.25 - 0.5 ng/mL          Stopping of antibiotics                                             encouraged.     Stopping of antibiotics           encouraged.    ----------------------------     ------------------------------       PCT level decrease by              PCT >= 0.25 ng/mL       < 80% from peak PCT        AND PCT >= 0.5 ng/mL            Continuing antibiotics                                               encouraged.       Continuing antibiotics            encouraged.    ----------------------------     ------------------------------     PCT level increase compared          PCT > 0.5 ng/mL         with peak PCT AND          PCT >= 0.5 ng/mL             Escalation of antibiotics                                          strongly encouraged.      Escalation of antibiotics        strongly encouraged.     Current Facility-Administered Medications  Medication Dose Route Frequency Provider Last Rate Last Dose  . 0.9 %  sodium chloride infusion   Intravenous Continuous Bettey Costa, MD 75 mL/hr at 08/17/17 0951    . acetaminophen (TYLENOL) tablet 650 mg  650 mg Oral Q4H PRN Awilda Bill, NP  650 mg at 08/16/17 2135  . famotidine (PEPCID) tablet 20 mg  20 mg Oral Daily Mody, Sital, MD      . haloperidol lactate (HALDOL)  injection 2 mg  2 mg Intravenous Q6H PRN Clapacs, John T, MD   2 mg at 08/17/17 1126  . heparin injection 5,000 Units  5,000 Units Subcutaneous Driscilla Moats, MD   5,000 Units at 08/17/17 [phone]  . [START ON 08/18/2017] Influenza vac split quadrivalent PF (FLUARIX) injection 0.5 mL  0.5 mL Intramuscular Tomorrow-1000 Conforti, John, DO      . ipratropium-albuterol (DUONEB) 0.5-2.5 (3) MG/3ML nebulizer solution 3 mL  3 mL Nebulization Q6H Awilda Bill, NP   3 mL at 08/17/17 0729  . ipratropium-albuterol (DUONEB) 0.5-2.5 (3) MG/3ML nebulizer solution 3 mL  3 mL Nebulization Q6H PRN Awilda Bill, NP      . LORazepam (ATIVAN) injection 1-2 mg  1-2 mg Intravenous Q4H PRN Awilda Bill, NP      . MEDLINE mouth rinse  15 mL Mouth Rinse QID Awilda Bill, NP   15 mL at 08/17/17 0410  . ondansetron (ZOFRAN) tablet 4 mg  4 mg Oral Q6H PRN Lance Coon, MD       Or  . ondansetron [facility]) injection 4 mg  4 mg Intravenous Q6H PRN Lance Coon, MD        Musculoskeletal: Strength & Muscle Tone: decreased Gait & Station: unable to stand Patient leans: N/A  Psychiatric Specialty Exam: Physical Exam  Nursing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

## 2017-08-17 NOTE — Progress Notes (Signed)
CONCERNING: IV to Oral Route Change Policy  RECOMMENDATION: This patient is receiving famotidine by the intravenous route.  Based on criteria approved by the Pharmacy and Therapeutics Committee, the intravenous medication(s) is/are being converted to the equivalent oral dose form(s).   DESCRIPTION: These criteria include:  The patient is eating (either orally or via tube) and/or has been taking other orally administered medications for a least 24 hours  The patient has no evidence of active gastrointestinal bleeding or impaired GI absorption (gastrectomy, short bowel, patient on TNA or NPO).  If you have questions about this conversion, please contact the Pharmacy Department    [phone] )  [facility]   [phone] )  [facility]   [phone] )  [facility]   [phone] )  [facility]   [phone] )  [facility]   Simpson,Michael L, [facility] 08/17/2017 9:25 AM

## 2017-08-17 NOTE — Progress Notes (Signed)
[employer] - [place] [facility]   PATIENT NAME: [name]    MR#:  [id number]  DATE OF BIRTH:  [birth year]  SUBJECTIVE:   Patient is now extubated. She does not know where she is this morning. She does not recall how she got to the emergency room and why. She does state that she sniffs heroin. She denies suicidal or homicidal ideation.  REVIEW OF SYSTEMS:    Review of Systems  Constitutional: Negative.  Negative for chills, fever and malaise/fatigue.  HENT: Negative.  Negative for ear discharge, ear pain, hearing loss, nosebleeds and sore throat.   Eyes: Negative.  Negative for blurred vision and pain.  Respiratory: Negative.  Negative for cough, hemoptysis, shortness of breath and wheezing.   Cardiovascular: Negative.  Negative for chest pain, palpitations and leg swelling.  Gastrointestinal: Negative.  Negative for abdominal pain, blood in stool, diarrhea, nausea and vomiting.  Genitourinary: Negative.  Negative for dysuria.  Musculoskeletal: Negative.  Negative for back pain.  Skin: Negative.   Neurological: Negative for dizziness, tremors, speech change, focal weakness, seizures and headaches.  Endo/Heme/Allergies: Negative.  Does not bruise/bleed easily.  Psychiatric/Behavioral: Positive for depression, memory loss and substance abuse. Negative for hallucinations and suicidal ideas.    Tolerating Diet: npo      DRUG ALLERGIES:   Allergies  Allergen Reactions  . Amoxicillin Rash  . Latex   . Vancomycin   . Clindamycin/Lincomycin Rash    VITALS:  Blood pressure (!) 128/110, pulse 96, temperature 99.9 F (37.7 C), resp. rate (!) 26, height  (1.626 m), weight 57.4 kg (126 lb 8.7 oz), SpO2 99 %.  PHYSICAL EXAMINATION:  Constitutional: Appears well-developed and well-nourished. No distress. HENT: Normocephalic. .  Eyes: Conjunctivae are normal. PERRLA, no scleral icterus.  Neck: Normal ROM. Neck supple. No JVD. No tracheal  deviation. CVS: RRR, S1/S2 +, 2/6 SEM, no gallops, no carotid bruit.  Pulmonary: Effort and breath sounds normal, no stridor, rhonchi, wheezes, rales.  Abdominal: Soft. BS +,  no distension, tenderness, rebound or guarding.  Musculoskeletal: No edema and no tenderness.  Neuro: Cranial nerves II through XII are intact without focal deficits  Oriented to name and time not place Skin: She has bruising on the right side of her cheek  Psychiatric: Flat affect    LABORATORY PANEL:   CBC  Recent Labs Lab 08/16/17 0453  WBC 6.0  HGB 8.3*  HCT 24.0*  PLT 190   ------------------------------------------------------------------------------------------------------------------  Chemistries   Recent Labs Lab 08/15/17 1804 08/16/17 0453  NA 140 141  K 4.3 3.7  CL 104 112*  CO2 20* 23  GLUCOSE 74 85  BUN 24* 20  CREATININE 1.93* 1.35*  CALCIUM 9.2 8.4*  AST 43*  --   ALT 18  --   ALKPHOS 64  --   BILITOT 0.8  --    ------------------------------------------------------------------------------------------------------------------  Cardiac Enzymes No results for input(s): TROPONINI in the last 168 hours. ------------------------------------------------------------------------------------------------------------------  RADIOLOGY:  Dg Abd 1 View  Result Date: 08/16/2017 CLINICAL DATA:  Nasogastric placement. EXAM: ABDOMEN - 1 VIEW COMPARISON:  08/15/2017 FINDINGS: Nasogastric tube enters the stomach in has its tip in the mid body. Bowel gas pattern is normal. Right femoral catheter in place. IMPRESSION: Nasogastric tube tip in the mid body of the stomach. Electronically Signed   By: Paulina Fusi M.D.   On: 08/16/2017 10:44   Dg Abdomen 1 View  Result Date: 08/15/2017 CLINICAL DATA:  Line placement EXAM: ABDOMEN - 1  VIEW COMPARISON:  07/25/2017 FINDINGS: There is an enteric tube with tip in the left upper quadrant consistent with location in the upper stomach. Right femoral venous  catheter with tip overlying the L4 vertebra consistent with location in the distal inferior vena cava or proximal common iliac vein. Scattered gas and stool throughout the colon. No small or large bowel distention. No radiopaque stones. Visualized bones appear intact. IMPRESSION: Enteric tube and right femoral venous catheter appear in satisfactory location. Nonobstructing bowel gas pattern. Electronically Signed   By: Burman Nieves M.D.   On: 08/15/2017 21:28   Ct Head Wo Contrast  Result Date: 08/15/2017 CLINICAL DATA:  Altered level of consciousness EXAM: CT HEAD WITHOUT CONTRAST TECHNIQUE: Contiguous axial images were obtained from the base of the skull through the vertex without intravenous contrast. COMPARISON:  07/25/2017, 07/16/2016 FINDINGS: Brain: No evidence of acute infarction, hemorrhage, hydrocephalus, extra-axial collection or mass lesion/mass effect. Vascular: No hyperdense vessel or unexpected calcification. Skull: Normal. Negative for fracture or focal lesion. Sinuses/Orbits: Moderate mucosal thickening within the ethmoid, sphenoid and frontal sinuses. No acute orbital abnormality Other: None IMPRESSION: No CT evidence for acute intracranial abnormality.  Sinusitis Electronically Signed   By: Jasmine Pang M.D.   On: 08/15/2017 20:01   Dg Chest Portable 1 View  Result Date: 08/15/2017 CLINICAL DATA:  Intubation, altered mental status. EXAM: PORTABLE CHEST 1 VIEW COMPARISON:  07/26/2017 and 07/25/2017 FINDINGS: Interval placement of nasogastric tube coursing into the stomach and off the inferior portion of the film as tip is not visualized. Placement of endotracheal tube with tip 2.1 cm above the carina along the right tracheal wall. Lungs are adequately inflated with opacification in the right base likely atelectasis, although cannot exclude infection. Cardiomediastinal silhouette and remainder of the exam is unchanged. IMPRESSION: New right base opacification likely atelectasis,  although cannot exclude developing infection. Tubes and lines as described. Electronically Signed   By: Elberta Fortis M.D.   On: 08/15/2017 19:11     ASSESSMENT AND PLAN:   36 year old female with a history of polysubstance abuse who was started up tended brought to the ER for further evaluation.  1. Acute respiratory failure with hypoxia due to inability to protect airway Patient Was extubated yesterday and doing well from a respiratory state.   2. Sepsis due to right lower lung pneumonia: Intensivist has discontinued meropenem however patient did have fever last night I will start Zosyn Chest x-ray for this a.m. Lactic acidosis is resolved Blood cultures thus far are negative  3. Heart murmur: Patient underwent TEE which was negative for endocarditis  4. Polysubstance abuse: Patient counseled this morning.  5. Acute metabolic encephalopathy in the setting of polysubstance abuse EEG is characterized by diffusely distributed beta activity consistent with medications.  No epileptiform activity is noted  6. Acute anemia: Follow hemoglobin and transfuse if hemoglobin less than 7. Consider anemia panel.  7. Elevated CK likely from being found down with mild rhabdo Continue IV fluids  8. Acute kidney injury due to sepsis and dehydration which has resolved with IV fluids  D/w intensivist  Patient will need psychiatry evaluation and possible disposition to behavioral health when medically clear.   CODE STATUS: full  TOTAL TIME TAKING CARE OF THIS PATIENT: 28 minutes.     POSSIBLE D/C 2-4 days, DEPENDING ON CLINICAL CONDITION.   Quintez Maselli M.D on 08/17/2017 at 9:27 AM  Between 7am to 6pm - Pager - [phone] After 6pm go to [email or web address] - password [email or web address]  [facility]  Office  [phone]  CC: Primary care physician; Patient, No Pcp Per  Note: This dictation was prepared with Dragon dictation along with smaller phrase technology. Any  transcriptional errors that result from this process are unintentional.

## 2017-08-17 NOTE — Progress Notes (Signed)
Spoke with Dr Toni Amend on phone regarding prior note.  Pt states she will stay awake to talk with psychiatry. He will be in to see as soon as able.

## 2017-08-17 NOTE — Progress Notes (Signed)
Conversing with me while she eats all of breakfast. She is able to tell me correct month,year, hospital. Did not speak to Dr Juliene Pina much ago.  Denies wanting to kill herself. Says she feels mostly embarrassed. She is open to being admitted to psych unit. We discussed briefly that she has had a lot of loss in her life. At this time she thinks it would be a good idea to get out of this pattern of coping. She asked what her father and sister thought of all this when they were here yesterday.  I told her they verbalized to me mostly sadness, exhaustion, fear and that want her to get help.

## 2017-08-17 NOTE — Progress Notes (Signed)
Report called to [facility] RN 2A. Preparing to transfer pt to room 237. Safety sitter in attendance.Pt sister notified of transfer.

## 2017-08-18 ENCOUNTER — Inpatient Hospital Stay
Admission: AD | Admit: 2017-08-18 | Discharge: 2017-08-23 | DRG: 885 | Disposition: A | Discharge status: Home / Self Care | Payer: No Typology Code available for payment source | Attending: Psychiatry | Admitting: Psychiatry

## 2017-08-18 DIAGNOSIS — R112 Nausea with vomiting, unspecified: Secondary | ICD-10-CM | POA: Diagnosis not present

## 2017-08-18 DIAGNOSIS — F1190 Opioid use, unspecified, uncomplicated: Secondary | ICD-10-CM | POA: Diagnosis present

## 2017-08-18 DIAGNOSIS — K219 Gastro-esophageal reflux disease without esophagitis: Secondary | ICD-10-CM | POA: Diagnosis present

## 2017-08-18 DIAGNOSIS — Z86718 Personal history of other venous thrombosis and embolism: Secondary | ICD-10-CM

## 2017-08-18 DIAGNOSIS — E781 Pure hyperglyceridemia: Secondary | ICD-10-CM | POA: Diagnosis present

## 2017-08-18 DIAGNOSIS — Z8614 Personal history of Methicillin resistant Staphylococcus aureus infection: Secondary | ICD-10-CM | POA: Diagnosis not present

## 2017-08-18 DIAGNOSIS — G43909 Migraine, unspecified, not intractable, without status migrainosus: Secondary | ICD-10-CM | POA: Diagnosis present

## 2017-08-18 DIAGNOSIS — F17210 Nicotine dependence, cigarettes, uncomplicated: Secondary | ICD-10-CM | POA: Diagnosis present

## 2017-08-18 DIAGNOSIS — Z91040 Latex allergy status: Secondary | ICD-10-CM

## 2017-08-18 DIAGNOSIS — F1290 Cannabis use, unspecified, uncomplicated: Secondary | ICD-10-CM | POA: Diagnosis present

## 2017-08-18 DIAGNOSIS — Z811 Family history of alcohol abuse and dependence: Secondary | ICD-10-CM | POA: Diagnosis not present

## 2017-08-18 DIAGNOSIS — Z8619 Personal history of other infectious and parasitic diseases: Secondary | ICD-10-CM

## 2017-08-18 DIAGNOSIS — E538 Deficiency of other specified B group vitamins: Secondary | ICD-10-CM | POA: Diagnosis present

## 2017-08-18 DIAGNOSIS — Z91410 Personal history of adult physical and sexual abuse: Secondary | ICD-10-CM | POA: Diagnosis not present

## 2017-08-18 DIAGNOSIS — J45909 Unspecified asthma, uncomplicated: Secondary | ICD-10-CM | POA: Diagnosis present

## 2017-08-18 DIAGNOSIS — F3162 Bipolar disorder, current episode mixed, moderate: Secondary | ICD-10-CM | POA: Diagnosis present

## 2017-08-18 DIAGNOSIS — F1510 Other stimulant abuse, uncomplicated: Secondary | ICD-10-CM | POA: Diagnosis not present

## 2017-08-18 DIAGNOSIS — Z560 Unemployment, unspecified: Secondary | ICD-10-CM | POA: Diagnosis not present

## 2017-08-18 DIAGNOSIS — J690 Pneumonitis due to inhalation of food and vomit: Secondary | ICD-10-CM | POA: Diagnosis present

## 2017-08-18 DIAGNOSIS — F1414 Cocaine abuse with cocaine-induced mood disorder: Secondary | ICD-10-CM

## 2017-08-18 DIAGNOSIS — F1490 Cocaine use, unspecified, uncomplicated: Secondary | ICD-10-CM | POA: Diagnosis present

## 2017-08-18 DIAGNOSIS — Z79899 Other long term (current) drug therapy: Secondary | ICD-10-CM

## 2017-08-18 DIAGNOSIS — F11221 Opioid dependence with intoxication delirium: Secondary | ICD-10-CM | POA: Diagnosis not present

## 2017-08-18 DIAGNOSIS — I10 Essential (primary) hypertension: Secondary | ICD-10-CM | POA: Diagnosis present

## 2017-08-18 DIAGNOSIS — Z881 Allergy status to other antibiotic agents status: Secondary | ICD-10-CM | POA: Diagnosis not present

## 2017-08-18 DIAGNOSIS — F3130 Bipolar disorder, current episode depressed, mild or moderate severity, unspecified: Secondary | ICD-10-CM | POA: Diagnosis not present

## 2017-08-18 LAB — BASIC METABOLIC PANEL
Anion gap: 7 (ref 5–15)
BUN: 10 mg/dL (ref 6–20)
CHLORIDE: 108 mmol/L (ref 101–111)
CO2: 24 mmol/L (ref 22–32)
Calcium: 9.3 mg/dL (ref 8.9–10.3)
Creatinine, Ser: 0.91 mg/dL (ref 0.44–1.00)
GFR calc Af Amer: >60 mL/min (ref >60)
GLUCOSE: 121 mg/dL — AB (ref 65–99)
POTASSIUM: 3.6 mmol/L (ref 3.5–5.1)
Sodium: 139 mmol/L (ref 135–145)

## 2017-08-18 LAB — CULTURE, RESPIRATORY W GRAM STAIN

## 2017-08-18 LAB — RAPID HIV SCREEN (HIV 1/2 AB+AG)
HIV 1/2 ANTIBODIES: NONREACTIVE
HIV-1 P24 ANTIGEN - HIV24: NONREACTIVE

## 2017-08-18 LAB — CULTURE, RESPIRATORY

## 2017-08-18 LAB — CK: CK TOTAL: 1824 U/L — AB (ref 38–234)

## 2017-08-18 MED ORDER — VITAMIN B-12 1000 MCG PO TABS
1000.0000 ug | ORAL_TABLET | Freq: Every day | ORAL | Status: DC
  Administered 2017-08-19 – 2017-08-23 (×5): 1000 ug via ORAL
  Filled 2017-08-18 (×5): qty 1

## 2017-08-18 MED ORDER — HALOPERIDOL LACTATE 5 MG/ML IJ SOLN: 2.0000 mg | Freq: Four times a day (QID) | INTRAMUSCULAR | Status: DC | PRN

## 2017-08-18 MED ORDER — ALUM & MAG HYDROXIDE-SIMETH 200-200-20 MG/5ML PO SUSP
30.0000 mL | ORAL | Status: DC | PRN
  Administered 2017-08-19 – 2017-08-22 (×3): 30 mL via ORAL
  Filled 2017-08-18 (×3): qty 30

## 2017-08-18 MED ORDER — ENSURE ENLIVE PO LIQD
237.0000 mL | Freq: Two times a day (BID) | ORAL | Status: DC
  Administered 2017-08-18 – 2017-08-23 (×4): 237 mL via ORAL

## 2017-08-18 MED ORDER — BACITRACIN-NEOMYCIN-POLYMYXIN 400-5-5000 EX OINT
TOPICAL_OINTMENT | Freq: Three times a day (TID) | CUTANEOUS | Status: DC
  Administered 2017-08-18 – 2017-08-21 (×11): 1 via TOPICAL
  Filled 2017-08-18 (×12): qty 1

## 2017-08-18 MED ORDER — AMLODIPINE BESYLATE 5 MG PO TABS
10.0000 mg | ORAL_TABLET | Freq: Every day | ORAL | Status: DC
  Administered 2017-08-18 – 2017-08-23 (×6): 10 mg via ORAL
  Filled 2017-08-18 (×6): qty 2

## 2017-08-18 MED ORDER — NEOMYCIN-POLYMYXIN-PRAMOXINE 1 % EX CREA
TOPICAL_CREAM | Freq: Three times a day (TID) | CUTANEOUS | Status: DC
  Filled 2017-08-18: qty 28

## 2017-08-18 MED ORDER — CYANOCOBALAMIN 1000 MCG/ML IJ SOLN
1000.0000 ug | Freq: Every day | INTRAMUSCULAR | Status: AC
  Administered 2017-08-18 – 2017-08-19 (×2): 1000 ug via INTRAMUSCULAR
  Filled 2017-08-18 (×2): qty 1

## 2017-08-18 MED ORDER — ACETAMINOPHEN 325 MG PO TABS
650.0000 mg | ORAL_TABLET | ORAL | Status: DC | PRN
  Administered 2017-08-19: 650 mg via ORAL
  Filled 2017-08-18: qty 2

## 2017-08-18 MED ORDER — CEFUROXIME AXETIL 500 MG PO TABS
500.0000 mg | ORAL_TABLET | Freq: Two times a day (BID) | ORAL | Status: DC
  Administered 2017-08-18 – 2017-08-23 (×10): 500 mg via ORAL
  Filled 2017-08-18 (×10): qty 1

## 2017-08-18 MED ORDER — FAMOTIDINE 20 MG PO TABS
20.0000 mg | ORAL_TABLET | Freq: Every day | ORAL | Status: DC
  Administered 2017-08-18 – 2017-08-23 (×6): 20 mg via ORAL
  Filled 2017-08-18 (×6): qty 1

## 2017-08-18 MED ORDER — ACETAMINOPHEN 325 MG PO TABS: 650.0000 mg | ORAL_TABLET | Freq: Four times a day (QID) | ORAL | Status: DC | PRN

## 2017-08-18 MED ORDER — AMLODIPINE BESYLATE 5 MG PO TABS: 10.0000 mg | ORAL_TABLET | Freq: Every day | ORAL | Status: DC

## 2017-08-18 MED ORDER — CEFUROXIME AXETIL 500 MG PO TABS: 500.0000 mg | ORAL_TABLET | Freq: Two times a day (BID) | ORAL | 0 refills | Status: DC

## 2017-08-18 MED ORDER — MAGNESIUM HYDROXIDE 400 MG/5ML PO SUSP: 30.0000 mL | Freq: Every day | ORAL | Status: DC | PRN

## 2017-08-18 MED ORDER — CEFUROXIME AXETIL 500 MG PO TABS
500.0000 mg | ORAL_TABLET | Freq: Two times a day (BID) | ORAL | Status: DC
  Filled 2017-08-18: qty 1

## 2017-08-18 MED ORDER — QUETIAPINE FUMARATE 100 MG PO TABS
100.0000 mg | ORAL_TABLET | Freq: Every day | ORAL | Status: DC
  Administered 2017-08-18 – 2017-08-19 (×2): 100 mg via ORAL
  Filled 2017-08-18 (×2): qty 1

## 2017-08-18 MED ORDER — HYDROXYZINE HCL 50 MG PO TABS
50.0000 mg | ORAL_TABLET | Freq: Three times a day (TID) | ORAL | Status: DC | PRN
Start: 2017-08-18 — End: 2017-08-23
  Administered 2017-08-19 – 2017-08-21 (×6): 50 mg via ORAL
  Filled 2017-08-18 (×6): qty 1

## 2017-08-18 NOTE — [facility] Group Notes (Signed)
LCSW Group Therapy Note  08/18/2017 1:00pm  Type of Therapy and Topic:  Group Therapy:  Cognitive Distortions  Participation Level:  Did Not Attend   Description of Group:    Patients in this group will be introduced to the topic of cognitive distortions.  Patients will identify and describe cognitive distortions, describe the feelings these distortions create for them.  Patients will identify one or more situations in their personal life where they have cognitively distorted thinking and will verbalize challenging this cognitive distortion through positive thinking skills.  Patients will practice the skill of using positive affirmations to challenge cognitive distortions using affirmation cards.    Therapeutic Goals:  1. Patient will identify two or more cognitive distortions they have used 2. Patient will identify one or more emotions that stem from use of a cognitive distortion 3. Patient will demonstrate use of a positive affirmation to counter a cognitive distortion through discussion and/or role play. 4. Patient will describe one way cognitive distortions can be detrimental to wellness   Summary of Patient Progress:     Therapeutic Modalities:   Cognitive Behavioral Therapy Motivational Interviewing   Krishav Mamone P Torian Thoennes, LCSW 08/18/2017 2:29 PM   

## 2017-08-18 NOTE — Discharge Summary (Signed)
[facility] [place] at [facility]   PATIENT NAME: [name]    MR#:  [id number]  DATE OF BIRTH:  [birth year]  DATE OF ADMISSION:  08/15/2017   ADMITTING PHYSICIAN: Oralia Manis, MD  DATE OF DISCHARGE: 08/18/2017  PRIMARY CARE PHYSICIAN: Patient, No Pcp Per   ADMISSION DIAGNOSIS:   Hyperthermia [R50.9] Polysubstance abuse (HCC) [F19.10] Sepsis, due to unspecified organism (HCC) [A41.9] Drug overdose, undetermined intent, initial encounter [T50.904A]  DISCHARGE DIAGNOSIS:   Principal Problem:   Delirium, drug-induced (HCC) Active Problems:   Polysubstance abuse (HCC)   Severe sepsis (HCC)   Obtundation   HCAP (healthcare-associated pneumonia)   Acute respiratory failure with hypoxia (HCC)   Acute respiratory failure with hypoxia and hypercapnia (HCC)   Drug overdose   SECONDARY DIAGNOSIS:   Past Medical History:  Diagnosis Date  . Acute deep vein thrombosis of arm (HCC) 08/19/2014   Last Assessment & Plan:  - diagnosed 08/2014 in hospital - s/p heparin gtt - patient never took her xarelto x 3 month course that was planned - no symptoms today.  No further treatment - we discussed avoiding IVDU (see below) to reduce her chances of provoked upper extremity DVT   . Asthma   . HCV (hepatitis C virus) 07/17/2013   Last Assessment & Plan:  - positive antibody most recently 08/2014 - hep C RNA 08/2014 was negative - prior to that, HCV RNA [id number] in 06/2013. No record for genotype or imaging.   - patient counseled that she could become re-infected with continued IVDU or unprotected sexual encounters.   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   Last Assessment & Plan:  Discussed routine use of Valtrex prophylaxis at 36 weeks. She will be delivered by cesarean at 36-37 weeks.   . Infection with methicillin-resistant Staphylococcus aureus 03/19/2013   Overview: Arm abscess with MRSA, treated.   Subsequent screening negative  . IV drug abuse (HCC)   . Tenosynovitis 08/19/2014    HOSPITAL COURSE:   36 year old female with past medical history significant for polysubstance abuse including cocaine, heroin, methamphetamines and opioids, hepatitis C infection, prior admissions for drug overdose, hypertension was brought in secondary to erratic behavior while overdosed on drugs.  #1 acute encephalopathy-secondary to toxic metabolic causes. -Overdosed on multiple drugs based on her urine tox screen. -Patient is more alert but combative and agitated at this time. She is not delirious. -Will need further admission to behavioral medicine unit.  #2 sepsis-secondary to right lower lung pneumonia -Fevers  improved. On meropenem for possible aspiration. -Being discharged on Ceftin. Blood cultures are negative.  #3 acute respiratory failure with hypoxia on admission-secondary to overdose and an ablative to protect airway. Patient was in ICU requiring intubation and has been extubated and doing well.  #4 depression-has had some suicidal thoughts with no active plan. Could be from her mood from drug use -Psych consult and patient will be discharged to behavioral medicine unit.  #5 rhabdomyolysis-with elevated CPK however kidney function is normal. -Encouraged to drink plenty of fluids.  Patient will be discharged to behavioral medicine unit  DISCHARGE CONDITIONS:   Guarded CONSULTS OBTAINED:   Treatment Team:  Clapacs, Jackquline Denmark, MD  DRUG ALLERGIES:   Allergies  Allergen Reactions  . Amoxicillin Rash  . Latex   . Vancomycin   . Clindamycin/Lincomycin Rash   DISCHARGE MEDICATIONS:   Allergies as of 08/18/2017      Reactions  Amoxicillin Rash   Latex    Vancomycin    Clindamycin/lincomycin Rash      Medication List    STOP taking these medications   carvedilol 12.5 MG tablet Commonly known as:  COREG     TAKE these medications   amLODipine 10 MG tablet Commonly known  as:  NORVASC Take 1 tablet (10 mg total) by mouth daily.   cefUROXime 500 MG tablet Commonly known as:  CEFTIN Take 1 tablet (500 mg total) by mouth 2 (two) times daily with a meal. X 1 week   feeding supplement (ENSURE ENLIVE) Liqd Take 237 mLs by mouth 2 (two) times daily between meals.        DISCHARGE INSTRUCTIONS:   1. Behavioral medicine unit  DIET:   Cardiac diet  ACTIVITY:   Activity as tolerated  OXYGEN:   Home Oxygen: No.  Oxygen Delivery: room air  DISCHARGE LOCATION:   Behavioral medicine unit   If you experience worsening of your admission symptoms, develop shortness of breath, life threatening emergency, suicidal or homicidal thoughts you must seek medical attention immediately by calling 911 or calling your MD immediately  if symptoms less severe.  You Must read complete instructions/literature along with all the possible adverse reactions/side effects for all the Medicines you take and that have been prescribed to you. Take any new Medicines after you have completely understood and accpet all the possible adverse reactions/side effects.   Please note  You were cared for by a hospitalist during your hospital stay. If you have any questions about your discharge medications or the care you received while you were in the hospital after you are discharged, you can call the unit and asked to speak with the hospitalist on call if the hospitalist that took care of you is not available. Once you are discharged, your primary care physician will handle any further medical issues. Please note that NO REFILLS for any discharge medications will be authorized once you are discharged, as it is imperative that you return to your primary care physician (or establish a relationship with a primary care physician if you do not have one) for your aftercare needs so that they can reassess your need for medications and monitor your lab values.    On the day of Discharge:  VITAL  SIGNS:   Blood pressure (!) 152/110, pulse 91, temperature 98.5 F (36.9 C), temperature source Oral, resp. rate 18, height  (1.626 m), weight 57.4 kg (126 lb 8.7 oz), SpO2 100 %.  PHYSICAL EXAMINATION:    GENERAL:  36 y.o.-year-old patient lying in the bed with no acute distress.Verbally abusive EYES: Pupils equal, round, reactive to light and accommodation. No scleral icterus. Extraocular muscles intact.  HEENT: Head atraumatic, normocephalic. Oropharynx and nasopharynx clear.  NECK:  Supple, no jugular venous distention. No thyroid enlargement, no tenderness.  LUNGS: Normal breath sounds bilaterally, no wheezing, rales,rhonchi or crepitation. No use of accessory muscles of respiration. Decreased bibasilar breath sounds CARDIOVASCULAR: S1, S2 normal. No rubs, or gallops. 2/6 systolic murmur present ABDOMEN: Soft, non-tender, non-distended. Bowel sounds present. No organomegaly or mass.  EXTREMITIES: No pedal edema, cyanosis, or clubbing.  NEUROLOGIC: Cranial nerves II through XII are intact. Muscle strength 5/5 in all extremities. Sensation intact. Gait not checked.  PSYCHIATRIC: The patient is alert and oriented, verbally abusive, agitated mood  SKIN: No obvious rash, lesion, or ulcer.   DATA REVIEW:   CBC  Recent Labs Lab 08/16/17 0453  WBC 6.0  HGB  8.3*  HCT 24.0*  PLT 190    Chemistries   Recent Labs Lab 08/15/17 1804  08/18/17 0427  NA 140  < > 139  K 4.3  < > 3.6  CL 104  < > 108  CO2 20*  < > 24  GLUCOSE 74  < > 121*  BUN 24*  < > 10  CREATININE 1.93*  < > 0.91  CALCIUM 9.2  < > 9.3  AST 43*  --   --   ALT 18  --   --   ALKPHOS 64  --   --   BILITOT 0.8  --   --   < > = values in this interval not displayed.   Microbiology Results  Results for orders placed or performed during the hospital encounter of 08/15/17  Culture, blood (routine x 2)     Status: None (Preliminary result)   Collection Time: 08/15/17  8:59 PM  Result Value Ref Range Status     Specimen Description BLOOD RFOA  Final   Special Requests   Final    BOTTLES DRAWN AEROBIC AND ANAEROBIC Blood Culture adequate volume   Culture NO GROWTH 2 DAYS  Final   Report Status PENDING  Incomplete  Culture, blood (routine x 2)     Status: None (Preliminary result)   Collection Time: 08/15/17  8:59 PM  Result Value Ref Range Status   Specimen Description BLOOD RT CVC  Final   Special Requests   Final    BOTTLES DRAWN AEROBIC AND ANAEROBIC Blood Culture adequate volume   Culture NO GROWTH 2 DAYS  Final   Report Status PENDING  Incomplete  MRSA PCR Screening     Status: None   Collection Time: 08/15/17 11:22 PM  Result Value Ref Range Status   MRSA by PCR NEGATIVE NEGATIVE Final    Comment:        The GeneXpert MRSA Assay (FDA approved for NASAL specimens only), is one component of a comprehensive MRSA colonization surveillance program. It is not intended to diagnose MRSA infection nor to guide or monitor treatment for MRSA infections.   Culture, respiratory (NON-Expectorated)     Status: None (Preliminary result)   Collection Time: 08/16/17  3:23 AM  Result Value Ref Range Status   Specimen Description TRACHEAL ASPIRATE  Final   Special Requests NONE  Final   Gram Stain   Final    FEW WBC PRESENT,BOTH PMN AND MONONUCLEAR SQUAMOUS EPITHELIAL CELLS PRESENT ABUNDANT GRAM POSITIVE COCCI ABUNDANT GRAM NEGATIVE RODS FEW GRAM POSITIVE RODS    Culture   Final    CULTURE REINCUBATED FOR BETTER GROWTH Performed at [facility], [address], [place], [place] [address]    Report Status PENDING  Incomplete    RADIOLOGY:  No results found.   Management plans discussed with the patient, family and they are in agreement.  CODE STATUS:     Code Status Orders        Start     Ordered   08/15/17 2344  Full code  Continuous     08/15/17 2343    Code Status History    Date Active Date Inactive Code Status Order ID Comments User Context   07/24/2017  4:14  PM 07/28/2017  5:40 PM Full Code [id number]  Altamese Dilling, MD ED   07/16/2016  6:21 PM 07/18/2016 12:57 PM Full Code [id number]  Merwyn Katos, MD ED   07/10/2015 11:02 AM 07/12/2015 11:38 PM Full  Code [id number]  Enid Baas, MD Inpatient   05/18/2015  6:25 PM 05/22/2015  2:56 AM Full Code [id number]  Hildred Laser, MD Inpatient   05/17/2015 10:00 PM 05/18/2015  6:25 PM Full Code [id number]  Hildred Laser, MD Inpatient      TOTAL TIME TAKING CARE OF THIS PATIENT: 37 minutes.    Philomina Leon M.D on 08/18/2017 at 11:15 AM  Between 7am to 6pm - Pager - [phone]  After 6pm go to [email or web address] - Social research officer, government  [facility] [place] [facility]  Office  [phone]  CC: Primary care physician; Patient, No Pcp Per   Note: This dictation was prepared with Dragon dictation along with smaller phrase technology. Any transcriptional errors that result from this process are unintentional.

## 2017-08-18 NOTE — BH Assessment (Signed)
Pt access [name]) informed of pt admission for pre-admit.
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
## 2017-08-18 NOTE — Progress Notes (Signed)
This Clinical research associate left a Engineer, technical sales for Donia Pounds at [phone] regarding patient's court date on Monday October 8th. Request was for social worker to notify the court system that patient has been involuntarily committed.
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
## 2017-08-18 NOTE — Progress Notes (Signed)
Patient to discharged from telemetry, to be admitted to behavioral health. Femoral central line removed, no bleeding. Peri IV removed. Tele removed and discontinued. Report called to behavioral health.  AVS form cannot be printed due to IVC order. Pt's IVC papers, valuables form transferring with her to behavioral health. Transported with nurse, sitter and security.

## 2017-08-18 NOTE — BH Specialist Note (Addendum)
Patient is to be admitted to [facility] by Dr. Toni Amend and Dr.Kapur.  Attending Physician will be Dr. Jennet Maduro.   Patient has been assigned to room 313, by [facility] Charge Nurse Gwen.   Call report to [phone]. Clinician attempted to contact pt's RN Lenis Dickinson) to notify her of bed assignment. Per unit secretary Wynona Canes) RN in pt's room and is to return clinician's phone call.

## 2017-08-18 NOTE — Progress Notes (Signed)
36 year old female admitted to unit. Alert and oriented x4. Denies having SI/HI/AVH. Patient verbally contracts for safety. Patient with sad/flat affect, denies pain. Admits to using Cocaine, Heroin and THC, drug screen produced + results. Verbalized that her goals to work on while being here is "To be discharged from here." Oriented patient to room and unit. Skin and contraband search completed and witnessed by Jamesetta So, Charity fundraiser. Multiple tattoos to body observed along with multiple abrasions and scrapes to entire body, skin otherwise warm.. No contraband found. Admission assessment completed, fluid and nutrition offered. Patient remains safe on the unit with q 15 minute checks.

## 2017-08-18 NOTE — Care Management Note (Signed)
Case Management Note  Patient Details  Name: [name] MRN: [id number] Date of Birth: [birth year]  Subjective/Objective:      Provided uninsured [name] with a discount coupon from [employer] for Ceftin.               Action/Plan:   Expected Discharge Date:  08/18/17               Expected Discharge Plan:     In-House Referral:     Discharge planning Services     Post Acute Care Choice:    Choice offered to:     DME Arranged:    DME Agency:     HH Arranged:    HH Agency:     Status of Service:     If discussed at [employer], dates discussed:    Additional Comments:  Jinan Biggins A, RN 08/18/2017, 11:51 AM

## 2017-08-18 NOTE — BH Assessment (Signed)
Pt pending BMU bed assignment. 

## 2017-08-18 NOTE — Tx Team (Addendum)
Initial Treatment Plan 08/18/2017 3:25 PM [name] [id number]    PATIENT STRESSORS: Financial difficulties Health problems Legal issue Substance abuse   PATIENT STRENGTHS: Ability for insight Supportive family/friends   PATIENT IDENTIFIED PROBLEMS: Drug Overdose  Healthcare Associated pneumonia  Polysubstance Abuse                 DISCHARGE CRITERIA:  Adequate post-discharge living arrangements Improved stabilization in mood, thinking, and/or behavior  PRELIMINARY DISCHARGE PLAN: Attend 12-step recovery group Return to previous living arrangement Referrals indicated:  Substance abuse program  PATIENT/FAMILY INVOLVEMENT: This treatment plan has been presented to and reviewed with the patient, [name], and/or family member.  The patient and family have been given the opportunity to ask questions and make suggestions.  Kincaid Tiger L Farooq Petrovich, RN 08/18/2017, 3:25 PM

## 2017-08-19 LAB — BASIC METABOLIC PANEL
ANION GAP: 10 (ref 5–15)
BUN: 12 mg/dL (ref 6–20)
CALCIUM: 10.4 mg/dL — AB (ref 8.9–10.3)
CO2: 25 mmol/L (ref 22–32)
Chloride: 99 mmol/L — ABNORMAL LOW (ref 101–111)
Creatinine, Ser: 1.09 mg/dL — ABNORMAL HIGH (ref 0.44–1.00)
GFR calc Af Amer: >60 mL/min (ref >60)
GLUCOSE: 122 mg/dL — AB (ref 65–99)
POTASSIUM: 3.6 mmol/L (ref 3.5–5.1)
SODIUM: 134 mmol/L — AB (ref 135–145)

## 2017-08-19 LAB — LIPID PANEL
Cholesterol: 357 mg/dL — ABNORMAL HIGH (ref 0–200)
HDL: 37 mg/dL — ABNORMAL LOW (ref >40)
LDL CALC: 273 mg/dL — AB (ref 0–99)
TRIGLYCERIDES: 237 mg/dL — AB (ref <150)
Total CHOL/HDL Ratio: 9.6 RATIO
VLDL: 47 mg/dL — AB (ref 0–40)

## 2017-08-19 LAB — HEMOGLOBIN A1C
HEMOGLOBIN A1C: 5.2 % (ref 4.8–5.6)
Mean Plasma Glucose: 102.54 mg/dL

## 2017-08-19 LAB — CK: Total CK: 787 U/L — ABNORMAL HIGH (ref 38–234)

## 2017-08-19 MED ORDER — CARVEDILOL 12.5 MG PO TABS
12.5000 mg | ORAL_TABLET | Freq: Two times a day (BID) | ORAL | Status: DC
  Administered 2017-08-19 – 2017-08-23 (×8): 12.5 mg via ORAL
  Filled 2017-08-19 (×8): qty 1

## 2017-08-19 MED ORDER — CLONIDINE HCL 0.1 MG PO TABS
0.1000 mg | ORAL_TABLET | Freq: Two times a day (BID) | ORAL | Status: DC | PRN
  Administered 2017-08-19 – 2017-08-20 (×2): 0.1 mg via ORAL
  Filled 2017-08-19 (×2): qty 1

## 2017-08-19 MED ORDER — ONDANSETRON 4 MG PO TBDP
4.0000 mg | ORAL_TABLET | Freq: Three times a day (TID) | ORAL | Status: DC | PRN
Start: 2017-08-19 — End: 2017-08-20
  Administered 2017-08-19 – 2017-08-20 (×2): 4 mg via ORAL
  Filled 2017-08-19 (×2): qty 1

## 2017-08-19 MED ORDER — NICOTINE 21 MG/24HR TD PT24
21.0000 mg | MEDICATED_PATCH | Freq: Every day | TRANSDERMAL | Status: DC
  Administered 2017-08-20 – 2017-08-23 (×4): 21 mg via TRANSDERMAL
  Filled 2017-08-19 (×4): qty 1

## 2017-08-19 MED ORDER — IBUPROFEN 600 MG PO TABS
600.0000 mg | ORAL_TABLET | Freq: Four times a day (QID) | ORAL | Status: DC | PRN
  Administered 2017-08-19 – 2017-08-22 (×2): 600 mg via ORAL
  Filled 2017-08-19 (×2): qty 1

## 2017-08-19 MED ORDER — LOPERAMIDE HCL 2 MG PO CAPS
2.0000 mg | ORAL_CAPSULE | ORAL | Status: DC | PRN
  Administered 2017-08-19: 2 mg via ORAL
  Filled 2017-08-19: qty 1

## 2017-08-19 MED ORDER — HYDRALAZINE HCL 50 MG PO TABS
25.0000 mg | ORAL_TABLET | Freq: Three times a day (TID) | ORAL | Status: DC
  Administered 2017-08-19 – 2017-08-21 (×6): 25 mg via ORAL
  Filled 2017-08-19 (×6): qty 1

## 2017-08-19 MED ORDER — SUMATRIPTAN SUCCINATE 50 MG PO TABS
25.0000 mg | ORAL_TABLET | Freq: Once | ORAL | Status: AC
  Administered 2017-08-19: 25 mg via ORAL
  Filled 2017-08-19: qty 1

## 2017-08-19 NOTE — Progress Notes (Signed)
Received [name] this am, she refused breakfast related to feeling nausea,  vomiting and having a migraine headache. She was treated for her symptoms with medications and was given saltines and Gatorade. She remained in the milieu most of the day at intervals talking to select peers. Her symptoms have decreased throughout the day.

## 2017-08-19 NOTE — [facility] Group Notes (Signed)
[facility] LCSW Group Therapy 08/19/2017 1:15pm  Type of Therapy: Group Therapy- Feelings Around Discharge & Establishing a Supportive Framework  Participation Level:  Did Not Attend  Description of Group:   What is a supportive framework? What does it look like feel like and how do I discern it from and unhealthy non-supportive network? Learn how to cope when supports are not helpful and don't support you. Discuss what to do when your family/friends are not supportive.   Therapeutic Modalities:   Cognitive Behavioral Therapy Person-Centered Therapy Motivational Interviewing   [name] C Damira Kem, LCSW 08/19/2017 12:56 PM     

## 2017-08-19 NOTE — [facility] Group Notes (Signed)
[facility] Group Notes:  (Nursing/MHT/Case Management/Adjunct)  Date:  08/19/2017  Time:  4:57 AM  Type of Therapy:  Psychoeducational Skills  Participation Level:  Active  Participation Quality:  Appropriate, Attentive and Sharing  Affect:  Appropriate  Cognitive:  Appropriate  Insight:  Appropriate and Good  Engagement in Group:  Engaged  Modes of Intervention:  Discussion, Socialization and Support  Summary of Progress/Problems:  Chancy Milroy 08/19/2017, 4:57 AM

## 2017-08-19 NOTE — [facility] Group Notes (Signed)
[facility] Group Notes:  (Nursing/MHT/Case Management/Adjunct)  Date:  08/19/2017  Time:  9:26 PM  Type of Therapy:  Group Therapy  Participation Level:  Active  Participation Quality:  Appropriate and Attentive  Affect:  Appropriate  Cognitive:  Appropriate  Insight:  Appropriate  Engagement in Group:  Engaged  Modes of Intervention:  Support  Summary of Progress/Problems:  [name] A [name] 08/19/2017, 9:26 PM

## 2017-08-19 NOTE — Progress Notes (Signed)
Pleasant on contact, visible in the milieu. She denies all psych symptoms and is clo of some nausea. Was med compliant. Remains on routine obs for safety.
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
## 2017-08-19 NOTE — Plan of Care (Signed)
Problem: Activity: Goal: Interest or engagement in leisure activities will improve Outcome: Progressing Aithana has been OOB in the milieu at intervals.  Problem: Coping: Goal: Ability to cope will improve Outcome: Progressing Neola is feeling better and talking on the phone at intervals.

## 2017-08-19 NOTE — Progress Notes (Signed)
At 1945 pt's BP was 160/105. BP meds were given as ordered and clonidine prn was given. At 2130 put clo seeing spots. BP was 128/74. The spots disappeared almost immediately. Pt has been social with select peers, she is somewhat hyper-verbal on contact. Discussed possible treatment for substance abuse on discharge. She stated she would have to go home to get her stuff first. Discussed why this is not a good idea.  States her nausea and headache are gone after meds received earlier. Was med compliant and attended group this evening. Remains on routine obs for safety.

## 2017-08-19 NOTE — [facility] Suicide Risk Assessment (Signed)
[facility] INPATIENT:  Family/Significant Other Suicide Prevention Education  Suicide Prevention Education:  Patient Refusal for Family/Significant Other Suicide Prevention Education: The patient [name] has refused to provide written consent for family/significant other to be provided Family/Significant Other Suicide Prevention Education during admission and/or prior to discharge.  Physician notified.  [name] 08/19/2017, 2:14 PM
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
## 2017-08-20 DIAGNOSIS — F3130 Bipolar disorder, current episode depressed, mild or moderate severity, unspecified: Principal | ICD-10-CM

## 2017-08-20 LAB — CULTURE, BLOOD (ROUTINE X 2)
CULTURE: NO GROWTH
Culture: NO GROWTH
SPECIAL REQUESTS: ADEQUATE
Special Requests: ADEQUATE

## 2017-08-20 MED ORDER — QUETIAPINE FUMARATE 200 MG PO TABS
300.0000 mg | ORAL_TABLET | Freq: Every day | ORAL | Status: DC
  Administered 2017-08-20: 300 mg via ORAL
  Filled 2017-08-20: qty 1

## 2017-08-20 NOTE — Progress Notes (Signed)
[facility] MD Progress Note  08/20/2017 2:31 PM [name]  MRN:  [id number]  Subjective:   [name] is a 36 year old female with a history of substance abuse and mood instability transfer from medical floor where she was hospitalized for delirium due to multiple substances including cocaine, amphetamine, cannabis, and heroine.  08/20/2017. The patient met with treatment team today. She was pressured and verbose, very difficult to redirect, but pleasant, thoughtful, and cooperative. She finds Seroquel very useful. She took it in the past and it helps her racing thoughts and depression. She is very much interested in substance abuse treatment and already had a conversation with [name] about [facility] IOP participation at [facility]. She slept well. She tolerates medications well. There are no somatic complaints. Nursing reports the patient is compliant with treatment.   Treatment plan.We will continue Seroquel but increase the dose to 300 mg nightly for depression and mood stabilization. Trileptal and Tegretol are no good options as she has hepatitis C. Lithium is not a good option as there is some kidney injury from previous overdose. The patient has no health insurance but will follow up with [facility].    Social/disposition. The patient does have some support in the community. She will be living with a roommate following discharge. We worry that this is her pimp. She has a DUI related court date on October 8th.  Past psychiatric history. Long history of substance abuse. She did go to rehabilitation twice. There is a history of medication noncompliance. There were no intentional suicide attempt.  Family psychiatric history. Nonreported.   Principal Problem: Bipolar Disorder Diagnosis:   Patient Active Problem List   Diagnosis Date Noted  . Moderate mixed bipolar I disorder ([place]) [F31.62] 08/18/2017  . Bipolar I disorder, most recent episode depressed ([place]) [F31.30] 08/18/2017  . Opioid dependence  with intoxication delirium ([place]) [F11.221]   . Cocaine abuse with cocaine-induced mood disorder ([place]) [F14.14]   . Drug overdose [T50.901A]   . HCAP (healthcare-associated pneumonia) [J18.9] 08/15/2017  . Acute respiratory failure with hypoxia ([place]) [J96.01] 08/15/2017  . Acute respiratory failure with hypoxia and hypercapnia (HCC) [J96.01, J96.02] 08/15/2017  . Acute renal failure ([place]) [N17.9]   . Altered mental status [R41.82] 07/24/2017  . Delirium, drug-induced ([place]) [W96.759] 07/24/2017  . Opiate abuse, continuous ([place]) [F11.10] 07/24/2017  . 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 Severe sepsis ([place]) [A41.9, R65.20] 07/16/2016  . AKI (acute kidney injury) ([place]) [N17.9] 07/16/2016  . Obtundation [R40.1] 07/16/2016  . Elevated lactic acid level [R79.89] 07/16/2016  . Drug abuse, IV ([place]) [F19.10] 07/16/2016  . Elevated LFTs [R94.5] 07/16/2016  . Sepsis ([place]) [A41.9] 07/10/2015  . Oligohydramnios [O41.00X0] 05/18/2015  . S/P cesarean section [Z98.891] 05/18/2015  . Preterm uterine contractions [O47.9] 05/17/2015  . Oligohydramnios antepartum [O41.00X0] 05/17/2015  . H/O cesarean section complicating pregnancy [F63.846] 05/17/2015  . Preterm contractions [O47.9] 05/17/2015  . AA (alcohol abuse) [F10.10] 12/08/2014  . Drug abuse during pregnancy [facility]) [K59.935, F19.10] 12/08/2014  . High risk sexual behavior [Z72.51] 12/08/2014  . Tenosynovitis [M65.9] 08/19/2014  . Acute deep vein thrombosis of arm (HCC) [I82.629] 08/19/2014  . HCV (hepatitis C virus) [B19.20] 07/17/2013  . H/O cesarean section [Z98.891] 05/18/2013  . Herpes infection in pregnancy [O98.519, B00.9] 05/18/2013  . Infection with methicillin-resistant Staphylococcus aureus [A49.02] 03/19/2013  . Cannabis abuse [F12.10] 03/18/2013  . Polysubstance abuse ([place]) [F19.10] 03/18/2013  . Compulsive tobacco user syndrome [F17.200] 03/17/2013   Total Time spent with patient: 30 minutes  Past Medical History:   Past Medical History:  Diagnosis Date  . Acute deep vein thrombosis of arm (HCC) 08/19/2014   Last Assessment & Plan:  - diagnosed 08/2014 in hospital - s/p heparin gtt - patient never took her xarelto x 3 month course that was planned - no symptoms today.  No further treatment - we discussed avoiding IVDU (see below) to reduce her chances of provoked upper extremity DVT   . Asthma   . HCV (hepatitis C virus) 07/17/2013   Last Assessment & Plan:  - positive antibody most recently 08/2014 - hep C RNA 08/2014 was negative - prior to that, HCV RNA [id number] in 06/2013. No record for genotype or imaging.   - patient counseled that she could become re-infected with continued IVDU or unprotected sexual encounters.   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   Last Assessment & Plan:  Discussed routine use of Valtrex prophylaxis at 36 weeks. She will be delivered by cesarean at 36-37 weeks.   . Infection with methicillin-resistant Staphylococcus aureus 03/19/2013   Overview: Arm abscess with MRSA, treated.  Subsequent screening negative  . IV drug abuse ([place])   . Tenosynovitis 08/19/2014    Past Surgical History:  Procedure Laterality Date  . CESAREAN SECTION    . CESAREAN SECTION N/A 05/18/2015   Procedure: CESAREAN SECTION;  Surgeon: Rubie Maid, MD;  Location: [facility] ORS;  Service: Obstetrics;  Laterality: N/A;  . NECK SURGERY     Fusion   Family History:  Family History  Problem Relation Age of Onset  . Diabetes Mother   . COPD Mother   . Hypertension Mother   . Lung cancer Mother   . Alcohol abuse Father   . Lung cancer Maternal Grandmother     Social History:  History  Alcohol Use  . 0.0 oz/week    Comment: bootlegger x2, patient states that she rarely drinks     History  Drug Use  . Types: Marijuana, Cocaine, Heroin, IV, Amphetamines, "Crack" cocaine    Comment: uses IV drugs, heroin    Social History   Social History  .  Marital status: Single    Spouse name: N/A  . Number of children: N/A  . Years of education: N/A   Social History Main Topics  . Smoking status: Current Every Day Smoker    Packs/day: 1.00    Years: 7.00    Types: Cigarettes  . Smokeless tobacco: Never Used     Comment: Patient refused  . Alcohol use 0.0 oz/week     Comment: bootlegger x2, patient states that she rarely drinks  . Drug use: Yes    Types: Marijuana, Cocaine, Heroin, IV, Amphetamines, "Crack" cocaine     Comment: uses IV drugs, heroin  . Sexual activity: Yes    Birth control/ protection: Injection     Comment: Patient stated that she is on birth control, injection. She could not remember the name of  it.   Other Topics Concern  . None   Social History Narrative   Lives at home with mother   Additional Social History:    History of alcohol / drug use?: No history of alcohol / drug abuse Longest period of sobriety (when/how long): "I can't remember" Negative Consequences of Use: Legal, Personal relationships, Financial Withdrawal Symptoms: Agitation                    Sleep: Fair  Appetite:  Poor  Current Medications: Current Facility-Administered Medications  Medication Dose Route Frequency Provider Last Rate Last Dose  . acetaminophen (TYLENOL) tablet 650 mg  650 mg Oral Q4H PRN Chauncey Mann, MD   650 mg at 08/19/17 0927  . alum & mag hydroxide-simeth (MAALOX/MYLANTA) 200-200-20 MG/5ML suspension 30 mL  30 mL Oral Q4H PRN Chauncey Mann, MD   30 mL at 08/20/17 1214  . amLODipine (NORVASC) tablet 10 mg  10 mg Oral Daily Chauncey Mann, MD   10 mg at 08/20/17 0747  . carvedilol (COREG) tablet 12.5 mg  12.5 mg Oral BID WC Chauncey Mann, MD   12.5 mg at 08/20/17 0748  . cefUROXime (CEFTIN) tablet 500 mg  500 mg Oral BID WC Chauncey Mann, MD   500 mg at 08/20/17 0751  . cloNIDine (CATAPRES) tablet 0.1 mg  0.1 mg Oral BID PRN Chauncey Mann, MD   0.1 mg at 08/19/17 1946  . cyanocobalamin ((VITAMIN  B-12)) injection 1,000 mcg  1,000 mcg Intramuscular Daily Chauncey Mann, MD   1,000 mcg at 08/19/17 0849  . famotidine (PEPCID) tablet 20 mg  20 mg Oral Daily Chauncey Mann, MD   20 mg at 08/20/17 0747  . feeding supplement (ENSURE ENLIVE) (ENSURE ENLIVE) liquid 237 mL  237 mL Oral BID BM Chauncey Mann, MD   237 mL at 08/18/17 1430  . hydrALAZINE (APRESOLINE) tablet 25 mg  25 mg Oral Q8H Chauncey Mann, MD   25 mg at 08/20/17 1308  . hydrOXYzine (ATARAX/VISTARIL) tablet 50 mg  50 mg Oral TID PRN Chauncey Mann, MD   50 mg at 08/20/17 1216  . ibuprofen (ADVIL,MOTRIN) tablet 600 mg  600 mg Oral Q6H PRN Chauncey Mann, MD   600 mg at 08/19/17 1242  . loperamide (IMODIUM) capsule 2 mg  2 mg Oral PRN Chauncey Mann, MD   2 mg at 08/19/17 1242  . magnesium hydroxide (MILK OF MAGNESIA) suspension 30 mL  30 mL Oral Daily PRN Chauncey Mann, MD      . neomycin-bacitracin-polymyxin (NEOSPORIN) ointment   Topical Q8H Chauncey Mann, MD   1 application at 65/78/46 [phone]  . nicotine (NICODERM CQ - dosed in mg/24 hours) patch 21 mg  21 mg Transdermal Q0600 Pucilowska, Jolanta B, MD   21 mg at 08/20/17 0747  . QUEtiapine (SEROQUEL) tablet 300 mg  300 mg Oral QHS Pucilowska, Jolanta B, MD      . vitamin B-12 (CYANOCOBALAMIN) tablet 1,000 mcg  1,000 mcg Oral Daily Chauncey Mann, MD   1,000 mcg at 08/20/17 0747    Lab Results:  Results for orders placed or performed during the hospital encounter of 08/18/17 (from the past 48 hour(s))  CK     Status: Abnormal   Collection Time: 08/19/17  7:08 AM  Result Value Ref Range   Total CK 787 (H) 38 - 234 U/L  Lipid panel     Status: Abnormal   Collection Time: 08/19/17  7:08 AM  Result Value Ref Range   Cholesterol 357 (H) 0 - 200 mg/dL   Triglycerides 237 (H) <150 mg/dL   HDL 37 (L) >40 mg/dL   Total CHOL/HDL Ratio 9.6 RATIO   VLDL 47 (H) 0 - 40 mg/dL   LDL Cholesterol 273 (H) 0 - 99 mg/dL    Comment:        Total Cholesterol/HDL:CHD Risk Coronary Heart  Disease Risk Table  Men   Women  1/2 Average Risk   3.4   3.3  Average Risk       5.0   4.4  2 X Average Risk   9.6   7.1  3 X Average Risk  23.4   11.0        Use the calculated Patient Ratio above and the CHD Risk Table to determine the patient's CHD Risk.        ATP III CLASSIFICATION (LDL):  <100     mg/dL   Optimal  100-129  mg/dL   Near or Above                    Optimal  130-159  mg/dL   Borderline  160-189  mg/dL   High  >190     mg/dL   Very High   Basic metabolic panel     Status: Abnormal   Collection Time: 08/19/17  7:08 AM  Result Value Ref Range   Sodium 134 (L) 135 - 145 mmol/L   Potassium 3.6 3.5 - 5.1 mmol/L   Chloride 99 (L) 101 - 111 mmol/L   CO2 25 22 - 32 mmol/L   Glucose, Bld 122 (H) 65 - 99 mg/dL   BUN 12 6 - 20 mg/dL   Creatinine, Ser 1.09 (H) 0.44 - 1.00 mg/dL   Calcium 10.4 (H) 8.9 - 10.3 mg/dL   GFR calc non Af Amer >60 >60 mL/min   GFR calc Af Amer >60 >60 mL/min    Comment: (NOTE) The eGFR has been calculated using the CKD EPI equation. This calculation has not been validated in all clinical situations. eGFR's persistently <60 mL/min signify possible Chronic Kidney Disease.    Anion gap 10 5 - 15  Hemoglobin A1c     Status: None   Collection Time: 08/19/17  7:08 AM  Result Value Ref Range   Hgb A1c MFr Bld 5.2 4.8 - 5.6 %    Comment: (NOTE) Pre diabetes:          5.7%-6.4% Diabetes:              >6.4% Glycemic control for   <7.0% adults with diabetes    Mean Plasma Glucose 102.54 mg/dL    Comment: Performed at [facility] [address] [place], [place] [address]    Blood Alcohol level:  Lab Results  Component Value Date   [facility] <5 07/24/2017   ETH <5 43/15/4008    Metabolic Disorder Labs: Lab Results  Component Value Date   HGBA1C 5.2 08/19/2017   MPG 102.54 08/19/2017   No results found for: PROLACTIN Lab Results  Component Value Date   CHOL 357 (H) 08/19/2017   TRIG 237 (H) 08/19/2017    HDL 37 (L) 08/19/2017   CHOLHDL 9.6 08/19/2017   VLDL 47 (H) 08/19/2017   LDLCALC 273 (H) 08/19/2017    Physical Findings: AIMS: Facial and Oral Movements Muscles of Facial Expression: None, normal Lips and Perioral Area: None, normal Jaw: None, normal Tongue: None, normal,Extremity Movements Upper (arms, wrists, hands, fingers): None, normal Lower (legs, knees, ankles, toes): None, normal, Trunk Movements Neck, shoulders, hips: None, normal, Overall Severity Severity of abnormal movements (highest score from questions above): None, normal Incapacitation due to abnormal movements: None, normal Patient's awareness of abnormal movements (rate only patient's report): No Awareness, Dental Status Current problems with teeth and/or dentures?: No Does patient usually wear dentures?: No  CIWA:  CIWA-Ar Total: 0 COWS:  COWS Total Score: 0  Musculoskeletal: Strength & Muscle Tone: within normal limits Gait & Station: normal Patient leans: N/A  Psychiatric Specialty Exam: Physical Exam  Nursing note and vitals reviewed. Psychiatric: Thought content normal. Her mood appears anxious. Her affect is labile. Her speech is rapid and/or pressured. She is hyperactive. Cognition and memory are normal. She expresses impulsivity.    Review of Systems  Skin:       Patient has pain from lacerations on her face  Neurological: Positive for headaches.  Psychiatric/Behavioral: Positive for substance abuse.  All other systems reviewed and are negative.   Blood pressure (!) 133/92, pulse 88, temperature 98.2 F (36.8 C), temperature source Oral, resp. rate 20, height _0  (1.676 m), weight 54.4 kg (120 lb), SpO2 100 %.Body mass index is 19.37 kg/m.  General Appearance: Casual  Eye Contact:  Good  Speech:  Pressured  Volume:  Increased  Mood:  Euphoric  Affect:  Labile  Thought Process:  Linear and Descriptions of Associations: Loose  Orientation:  Full (Time, Place, and Person)  Thought  Content:  WDL  Suicidal Thoughts:  No  Homicidal Thoughts:  No  Memory:  Immediate;   Fair Recent;   Fair Remote;   Fair  Judgement:  Poor  Insight:  Fair and Lacking  Psychomotor Activity:  Increased  Concentration:  Concentration: Fair and Attention Span: Fair  Recall:  [employer] of Knowledge:  Fair  Language:  Fair  Akathisia:  No  Handed:  Right  AIMS (if indicated):     Assets:  Communication Skills Desire for Improvement Housing Physical Health Resilience Social Support  ADL's:  Intact  Cognition:  WNL  Sleep:  Number of Hours: 7.3     Treatment Plan Summary:  [name] is a 36 year old female with history of substance and mood instability transfer from medical floor where she was admitted for substance related delirium.  [name] is a 36 year old divorced Caucasian female with history of polysubstance use as well as bipolar disorder who was admitted to the medical service and a delirious state in the context of multiple substances.   1. Suicidal ideation. The patient adamantly denies any thoughts, intentions, or plans for herself or others.  2. Mood. We will increase Seroquel to 300 mg nightly.  3. Metabolic syndrome monitoring. Lipid panel is elevated, her hemoglobin A1c normal.  4. EKG. Pending.  5. Pregnancy test is negative.  6. Substance abuse. The patient declines residential treatment but is interested in [place] IOP program participation. We will try to refer her to [place].  7. Hypertension. She is on Norvasc 10 mg daily, carvedilol 12.5 mg twice daily, and hydralazine 25 mg 3 times daily. Clonidine is also available.   8. Aspiration pneumonia. We continue Ceftin 500 mg twice daily for a week.  9. Vitamin B12 deficiency. We started supplementation.  10. GERD. She is on Pepcid.  11 smoking. Nicotine patch is available.  12. Disposition. To be established..     Daily contact with patient to assess and evaluate symptoms and progress in  treatment and Medication management  Orson Slick, MD 08/20/2017, 2:31 PM

## 2017-08-20 NOTE — Final Progress Note (Signed)
Received [name] this am before breakfast. She was medicated per order and received Zofran for nausea and vomiting with moderate relief. She continues to have a difficulty time keeping food down, but the nausea decreased as the day progressed. She has been OOB in the milieu most of the day socializing with her peer and attending the group therapy sessions.

## 2017-08-20 NOTE — [facility] Group Notes (Signed)
[facility] Group Notes:  (Nursing/MHT/Case Management/Adjunct)  Date:  08/20/2017  Time:  3:46 PM  Type of Therapy:  Psychoeducational Skills  Participation Level:  Active  Participation Quality:  Appropriate  Affect:  Appropriate and Irritable  Cognitive:  Appropriate and Oriented  Insight:  Appropriate and Good  Engagement in Group:  Engaged  Modes of Intervention:  Discussion, Education and Exploration  Summary of Progress/Problems:  Judene Companion 08/20/2017, 3:46 PM

## 2017-08-20 NOTE — Plan of Care (Signed)
Problem: Activity: Goal: Interest or engagement in leisure activities will improve Outcome: Progressing Kelseigh is attending the group therapy sessions and went outdoors with her peers.  Problem: Coping: Goal: Ability to cope will improve Outcome: Progressing Shavona is coping with being a patient on the psychiatric unit.

## 2017-08-20 NOTE — Tx Team (Signed)
Interdisciplinary Treatment and Diagnostic Plan Update  08/20/2017 Time of Session: [place] MRN: [id number]  Principal Diagnosis: Bipolar I disorder, most recent episode depressed ([place])  Secondary Diagnoses: Principal Problem:   Bipolar I disorder, most recent episode depressed ([place]) Active Problems:   Moderate mixed bipolar I disorder (H[facility])   Opioid dependence with intoxication delirium ([place])   Cocaine abuse with cocaine-induced mood disorder ([place])   Current Medications:  Current Facility-Administered Medications  Medication Dose Route Frequency Provider Last Rate Last Dose  . acetaminophen (TYLENOL) tablet 650 mg  650 mg Oral Q4H PRN Chauncey Mann, MD   650 mg at 08/19/17 0927  . alum & mag hydroxide-simeth (MAALOX/MYLANTA) 200-200-20 MG/5ML suspension 30 mL  30 mL Oral Q4H PRN Chauncey Mann, MD   30 mL at 08/20/17 1214  . amLODipine (NORVASC) tablet 10 mg  10 mg Oral Daily Chauncey Mann, MD   10 mg at 08/20/17 0747  . carvedilol (COREG) tablet 12.5 mg  12.5 mg Oral BID WC Chauncey Mann, MD   12.5 mg at 08/20/17 0748  . cefUROXime (CEFTIN) tablet 500 mg  500 mg Oral BID WC Chauncey Mann, MD   500 mg at 08/20/17 0751  . cloNIDine (CATAPRES) tablet 0.1 mg  0.1 mg Oral BID PRN Chauncey Mann, MD   0.1 mg at 08/19/17 1946  . cyanocobalamin ((VITAMIN B-12)) injection 1,000 mcg  1,000 mcg Intramuscular Daily Chauncey Mann, MD   1,000 mcg at 08/19/17 0849  . famotidine (PEPCID) tablet 20 mg  20 mg Oral Daily Chauncey Mann, MD   20 mg at 08/20/17 0747  . feeding supplement (ENSURE ENLIVE) (ENSURE ENLIVE) liquid 237 mL  237 mL Oral BID BM Chauncey Mann, MD   237 mL at 08/18/17 1430  . hydrALAZINE (APRESOLINE) tablet 25 mg  25 mg Oral Q8H Chauncey Mann, MD   25 mg at 08/20/17 1445  . hydrOXYzine (ATARAX/VISTARIL) tablet 50 mg  50 mg Oral TID PRN Chauncey Mann, MD   50 mg at 08/20/17 1216  . ibuprofen (ADVIL,MOTRIN) tablet 600 mg  600 mg Oral Q6H PRN Chauncey Mann,  MD   600 mg at 08/19/17 1242  . loperamide (IMODIUM) capsule 2 mg  2 mg Oral PRN Chauncey Mann, MD   2 mg at 08/19/17 1242  . magnesium hydroxide (MILK OF MAGNESIA) suspension 30 mL  30 mL Oral Daily PRN Chauncey Mann, MD      . neomycin-bacitracin-polymyxin (NEOSPORIN) ointment   Topical Q8H Chauncey Mann, MD   1 application at 23/53/61 1445  . nicotine (NICODERM CQ - dosed in mg/24 hours) patch 21 mg  21 mg Transdermal Q0600 Pucilowska, Jolanta B, MD   21 mg at 08/20/17 0747  . QUEtiapine (SEROQUEL) tablet 300 mg  300 mg Oral QHS Pucilowska, Jolanta B, MD      . vitamin B-12 (CYANOCOBALAMIN) tablet 1,000 mcg  1,000 mcg Oral Daily Chauncey Mann, MD   1,000 mcg at 08/20/17 0747   PTA Medications: Prescriptions Prior to Admission  Medication Sig Dispense Refill Last Dose  . amLODipine (NORVASC) 10 MG tablet Take 1 tablet (10 mg total) by mouth daily. 30 tablet 0 [place] at UNKNOWN  . cefUROXime (CEFTIN) 500 MG tablet Take 1 tablet (500 mg total) by mouth 2 (two) times daily with a meal. X 1 week 14 tablet 0   . feeding supplement, ENSURE ENLIVE, (ENSURE ENLIVE) LIQD Take 237  mLs by mouth 2 (two) times daily between meals. 237 mL 12     Patient Stressors: Financial difficulties Health problems Legal issue Substance abuse  Patient Strengths: Ability for insight Supportive family/friends  Treatment Modalities: Medication Management, Group therapy, Case management,  1 to 1 session with clinician, Psychoeducation, Recreational therapy.   Physician Treatment Plan for Primary Diagnosis: Bipolar I disorder, most recent episode depressed ([place]) Long Term Goal(s): Improvement in symptoms so as ready for discharge Improvement in symptoms so as ready for discharge   Short Term Goals: Ability to identify changes in lifestyle to reduce recurrence of condition will improve Ability to verbalize feelings will improve Ability to disclose and discuss suicidal ideas Ability to demonstrate  self-control will improve Ability to maintain clinical measurements within normal limits will improve Compliance with prescribed medications will improve Ability to identify triggers associated with substance abuse/mental health issues will improve Ability to verbalize feelings will improve Ability to disclose and discuss suicidal ideas Ability to demonstrate self-control will improve Ability to identify and develop effective coping behaviors will improve Ability to maintain clinical measurements within normal limits will improve Compliance with prescribed medications will improve Ability to identify triggers associated with substance abuse/mental health issues will improve  Medication Management: Evaluate patient's response, side effects, and tolerance of medication regimen.  Therapeutic Interventions: 1 to 1 sessions, Unit Group sessions and Medication administration.  Evaluation of Outcomes: Not Met  Physician Treatment Plan for Secondary Diagnosis: Principal Problem:   Bipolar I disorder, most recent episode depressed ([place]) Active Problems:   Moderate mixed bipolar I disorder (HCC)   Opioid dependence with intoxication delirium (HCC)   Cocaine abuse with cocaine-induced mood disorder ([place])  Long Term Goal(s): Improvement in symptoms so as ready for discharge Improvement in symptoms so as ready for discharge   Short Term Goals: Ability to identify changes in lifestyle to reduce recurrence of condition will improve Ability to verbalize feelings will improve Ability to disclose and discuss suicidal ideas Ability to demonstrate self-control will improve Ability to maintain clinical measurements within normal limits will improve Compliance with prescribed medications will improve Ability to identify triggers associated with substance abuse/mental health issues will improve Ability to verbalize feelings will improve Ability to disclose and discuss suicidal ideas Ability to  demonstrate self-control will improve Ability to identify and develop effective coping behaviors will improve Ability to maintain clinical measurements within normal limits will improve Compliance with prescribed medications will improve Ability to identify triggers associated with substance abuse/mental health issues will improve     Medication Management: Evaluate patient's response, side effects, and tolerance of medication regimen.  Therapeutic Interventions: 1 to 1 sessions, Unit Group sessions and Medication administration.  Evaluation of Outcomes: Not Met   RN Treatment Plan for Primary Diagnosis: Bipolar I disorder, most recent episode depressed ([place]) Long Term Goal(s): Knowledge of disease and therapeutic regimen to maintain health will improve  Short Term Goals: Ability to verbalize feelings will improve, Ability to identify and develop effective coping behaviors will improve and Compliance with prescribed medications will improve  Medication Management: RN will administer medications as ordered by provider, will assess and evaluate patient's response and provide education to patient for prescribed medication. RN will report any adverse and/or side effects to prescribing provider.  Therapeutic Interventions: 1 on 1 counseling sessions, Psychoeducation, Medication administration, Evaluate responses to treatment, Monitor vital signs and CBGs as ordered, Perform/monitor CIWA, COWS, AIMS and Fall Risk screenings as ordered, Perform wound care treatments as ordered.  Evaluation  of Outcomes: Not Met   LCSW Treatment Plan for Primary Diagnosis: Bipolar I disorder, most recent episode depressed ([place]) Long Term Goal(s): Safe transition to appropriate next level of care at discharge, Engage patient in therapeutic group addressing interpersonal concerns.  Short Term Goals: Engage patient in aftercare planning with referrals and resources, Facilitate patient progression through stages of  change regarding substance use diagnoses and concerns and Increase skills for wellness and recovery  Therapeutic Interventions: Assess for all discharge needs, 1 to 1 time with Social worker, Explore available resources and support systems, Assess for adequacy in community support network, Educate family and significant other(s) on suicide prevention, Complete Psychosocial Assessment, Interpersonal group therapy.  Evaluation of Outcomes: Not Met   Progress in Treatment: Attending groups: Yes. Participating in groups: Yes. Taking medication as prescribed: Yes. Toleration medication: Yes. Family/Significant other contact made: No, will contact:  pt refused Patient understands diagnosis: Yes. Discussing patient identified problems/goals with staff: Yes. Medical problems stabilized or resolved: Yes. Denies suicidal/homicidal ideation: Yes. Issues/concerns per patient self-inventory: No. Other: none  New problem(s) identified: No, Describe:  none  New Short Term/Long Term Goal(s): Pt goal: My mental issues need to be addressed, my mind races, substance abuse issues.  Discharge Plan or Barriers: CSW assessing for appropriate plan.  Reason for Continuation of Hospitalization: Depression Medication stabilization  Estimated Length of Stay: 3-5 days.  Attendees: Patient: [name] 08/20/2017   Physician: Dr. Bary Leriche, MD 08/20/2017   Nursing: Ledell Noss, RN 08/20/2017   RN Care Manager: 08/20/2017   Social Worker: Lurline Idol and Dossie Arbour, [place] 08/20/2017   Recreational Therapist:  08/20/2017   Other:  08/20/2017   Other:  08/20/2017   Other: 08/20/2017     Scribe for Treatment Team: Joanne Chars, [place] 08/20/2017 3:08 PM

## 2017-08-20 NOTE — Progress Notes (Signed)
Pt is in a pleasant mood. Interacting appropriately with staff and peers. Denies SI, HI or A/V hallucinations. Pt is med compliant. Pt was in dayroom interacting with peers. Pt is appropriate and cooperative. Denies pain or discomfort at this time. Contracted to safety. 15 min safety checks continues.

## 2017-08-20 NOTE — Plan of Care (Signed)
Problem: Safety: Goal: Ability to remain free from injury will improve Outcome: Progressing Pt will remain injury free the entire shift.   

## 2017-08-21 MED ORDER — PRAZOSIN HCL 2 MG PO CAPS
2.0000 mg | ORAL_CAPSULE | Freq: Two times a day (BID) | ORAL | Status: DC
  Administered 2017-08-22 – 2017-08-23 (×3): 2 mg via ORAL
  Filled 2017-08-21 (×3): qty 1

## 2017-08-21 MED ORDER — QUETIAPINE FUMARATE 200 MG PO TABS
600.0000 mg | ORAL_TABLET | Freq: Every day | ORAL | Status: DC
  Administered 2017-08-21 – 2017-08-22 (×2): 600 mg via ORAL
  Filled 2017-08-21 (×2): qty 3

## 2017-08-21 NOTE — Progress Notes (Signed)
Patient's affect is pleasant and cooperative. Denies SI/HI/AVH at this time. Compliant with medication and meals, present in the milieu throughout the shift. Denies pain, A&O x 4 with steady gait. Milieu remains safe with Q 15 minute checks.

## 2017-08-21 NOTE — Plan of Care (Signed)
Problem: Coping: Goal: Ability to verbalize feelings will improve Outcome: Progressing Patient has the ability to verbalize feelings to staff.  Problem: Safety: Goal: Ability to disclose and discuss suicidal ideas will improve Outcome: Progressing Patient is able to verbalize the fact that she does not have any feelings of SI. Goal: Ability to identify and utilize support systems that promote safety will improve Outcome: Not Progressing Patient states, "I don't have nobody in my corner right now."  Problem: Self-Concept: Goal: Level of anxiety will decrease Outcome: Progressing Patient states that her anxiety level is a 3 out of a 10 scale.  Problem: Education: Goal: Emotional status will improve Outcome: Progressing Patient affect has improved since admission.

## 2017-08-21 NOTE — [facility] Group Notes (Signed)
[facility] LCSW Group Therapy Note  Date/Time: 08/21/17, 0930  Type of Therapy/Topic:  Group Therapy:  Feelings about Diagnosis  Participation Level:  Active   Mood:pleasant   Description of Group:    This group will allow patients to explore their thoughts and feelings about diagnoses they have received. Patients will be guided to explore their level of understanding and acceptance of these diagnoses. Facilitator will encourage patients to process their thoughts and feelings about the reactions of others to their diagnosis, and will guide patients in identifying ways to discuss their diagnosis with significant others in their lives. This group will be process-oriented, with patients participating in exploration of their own experiences as well as giving and receiving support and challenge from other group members.   Therapeutic Goals: 1. Patient will demonstrate understanding of diagnosis as evidence by identifying two or more symptoms of the disorder:  2. Patient will be able to express two feelings regarding the diagnosis 3. Patient will demonstrate ability to communicate their needs through discussion and/or role plays  Summary of Patient Progress: Pt did a good job of identifying her diagnosis of bipolar disorder and discussing symptoms.  Pt made a number of contributions to the group discussion. Very good participation.        Therapeutic Modalities:   Cognitive Behavioral Therapy Brief Therapy Feelings Identification   Daleen Squibb, LCSW

## 2017-08-21 NOTE — Progress Notes (Signed)
[facility] MD Progress Note  08/21/2017 6:45 PM [name]  MRN:  [id number]  Subjective:  [name] is a 36 year old female with a history of mood instability and substance abuse transferred from medical floor where she was hospitalized after overdose on drugs for the second time this month.  Today, the patient is much calmer. She is no longer restless, hyper, intrusive or hyperverbal. She was able to work on a puzzle. Thoughts are still racing and the patient is unable to recall the overdose. Nausea and vomiting has resolved. Sleep was still interupted. She complains of nightmares. No side effects from medications. Blood pressure is well controlled.  Treatment plan. We will increase Seroquel to 600 mg for mood stabilization. We will start Minipress tomorrow for nightmares and flashbacks. I will discontinue Hydralazine as her blood pressure is low.   Past psychiatric history. Long history of substance abuse and mood instability. Sober for several months at the most.  Family psychiatric history. Bipolar.  Social/disposition. She lives with a "family friend" who is not a user. Intends to return there. No insurance.   Principal Problem: Bipolar I disorder, most recent episode depressed (HCC) Diagnosis:   Patient Active Problem List   Diagnosis Date Noted  . Moderate mixed bipolar I disorder (HCC) [F31.62] 08/18/2017  . Bipolar I disorder, most recent episode depressed (HCC) [F31.30] 08/18/2017  . Opioid dependence with intoxication delirium (HCC) [F11.221]   . Cocaine abuse with cocaine-induced mood disorder (HCC) [F14.14]   . Drug overdose [T50.901A]   . HCAP (healthcare-associated pneumonia) [J18.9] 08/15/2017  . Acute respiratory failure with hypoxia (HCC) [J96.01] 08/15/2017  . Acute respiratory failure with hypoxia and hypercapnia (HCC) [J96.01, J96.02] 08/15/2017  . Acute renal failure (HCC) [N17.9]   . Altered mental status [R41.82] 07/24/2017  . Delirium, drug-induced (HCC)  [J19.147] 07/24/2017  . Opiate abuse, continuous (HCC) [F11.10] 07/24/2017  . 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 Severe sepsis (HCC) [A41.9, R65.20] 07/16/2016  . AKI (acute kidney injury) (HCC) [N17.9] 07/16/2016  . Obtundation [R40.1] 07/16/2016  . Elevated lactic acid level [R79.89] 07/16/2016  . 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 Elevated LFTs [R94.5] 07/16/2016  . Sepsis (HCC) [A41.9] 07/10/2015  . Oligohydramnios [O41.00X0] 05/18/2015  . S/P cesarean section [Z98.891] 05/18/2015  . Preterm uterine contractions [O47.9] 05/17/2015  . Oligohydramnios antepartum [O41.00X0] 05/17/2015  . H/O cesarean section complicating pregnancy [O34.219] 05/17/2015  . Preterm contractions [O47.9] 05/17/2015  . AA (alcohol abuse) [F10.10] 12/08/2014  . Drug abuse during pregnancy [facility]) [W29.562, F19.10] 12/08/2014  . High risk sexual behavior [Z72.51] 12/08/2014  . Tenosynovitis [M65.9] 08/19/2014  . Acute deep vein thrombosis of arm (HCC) [I82.629] 08/19/2014  . HCV (hepatitis C virus) [B19.20] 07/17/2013  . H/O cesarean section [Z98.891] 05/18/2013  . Herpes infection in pregnancy [O98.519, B00.9] 05/18/2013  . Infection with methicillin-resistant Staphylococcus aureus [A49.02] 03/19/2013  . Cannabis abuse [F12.10] 03/18/2013  . Polysubstance abuse (HCC) [F19.10] 03/18/2013  . Compulsive tobacco user syndrome [F17.200] 03/17/2013   Total Time spent with patient: 30 minutes  Past Medical History:  Past Medical History:  Diagnosis Date  . Acute deep vein thrombosis of arm (HCC) 08/19/2014   Last Assessment & Plan:  - diagnosed 08/2014 in hospital - s/p heparin gtt - patient never took her xarelto x 3 month course that was planned - no symptoms today.  No further treatment - we discussed avoiding IVDU (see below) to reduce her chances of provoked upper extremity DVT   . Asthma   .  HCV (hepatitis C virus) 07/17/2013   Last Assessment & Plan:  - positive antibody most  recently 08/2014 - hep C RNA 08/2014 was negative - prior to that, HCV RNA 161096 in 06/2013. No record for genotype or imaging.   - patient counseled that she could become re-infected with continued IVDU or unprotected sexual encounters.   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   Last Assessment & Plan:  Discussed routine use of Valtrex prophylaxis at 36 weeks. She will be delivered by cesarean at 36-37 weeks.   . Infection with methicillin-resistant Staphylococcus aureus 03/19/2013   Overview: Arm abscess with MRSA, treated.  Subsequent screening negative  . IV drug abuse (HCC)   . Tenosynovitis 08/19/2014    Past Surgical History:  Procedure Laterality Date  . CESAREAN SECTION    . CESAREAN SECTION N/A 05/18/2015   Procedure: CESAREAN SECTION;  Surgeon: Hildred Laser, MD;  Location: [facility] ORS;  Service: Obstetrics;  Laterality: N/A;  . NECK SURGERY     Fusion   Family History:  Family History  Problem Relation Age of Onset  . Diabetes Mother   . COPD Mother   . Hypertension Mother   . Lung cancer Mother   . Alcohol abuse Father   . Lung cancer Maternal Grandmother     Social History:  History  Alcohol Use  . 0.0 oz/week    Comment: bootlegger x2, patient states that she rarely drinks     History  Drug Use  . Types: Marijuana, Cocaine, Heroin, IV, Amphetamines, "Crack" cocaine    Comment: uses IV drugs, heroin    Social History   Social History  . Marital status: Single    Spouse name: N/A  . Number of children: N/A  . Years of education: N/A   Social History Main Topics  . Smoking status: Current Every Day Smoker    Packs/day: 1.00    Years: 7.00    Types: Cigarettes  . Smokeless tobacco: Never Used     Comment: Patient refused  . Alcohol use 0.0 oz/week     Comment: bootlegger x2, patient states that she rarely drinks  . Drug use: Yes    Types: Marijuana, Cocaine, Heroin, IV, Amphetamines, "Crack" cocaine      Comment: uses IV drugs, heroin  . Sexual activity: Yes    Birth control/ protection: Injection     Comment: Patient stated that she is on birth control, injection. She could not remember the name of  it.   Other Topics Concern  . None   Social History Narrative   Lives at home with mother   Additional Social History:    History of alcohol / drug use?: No history of alcohol / drug abuse Longest period of sobriety (when/how long): "I can't remember" Negative Consequences of Use: Legal, Personal relationships, Financial Withdrawal Symptoms: Agitation                    Sleep: Fair  Appetite:  Fair  Current Medications: Current Facility-Administered Medications  Medication Dose Route Frequency Provider Last Rate Last Dose  . acetaminophen (TYLENOL) tablet 650 mg  650 mg Oral Q4H PRN Darliss Ridgel, MD   650 mg at 08/19/17 0927  . alum & mag hydroxide-simeth (MAALOX/MYLANTA) 200-200-20 MG/5ML suspension 30 mL  30 mL Oral Q4H PRN Darliss Ridgel, MD   30 mL at 08/20/17 1214  . amLODipine (NORVASC) tablet 10 mg  10 mg Oral Daily Darliss Ridgel, MD   10 mg at 08/21/17 1610  . carvedilol (COREG) tablet 12.5 mg  12.5 mg Oral BID WC Darliss Ridgel, MD   12.5 mg at 08/21/17 1625  . cefUROXime (CEFTIN) tablet 500 mg  500 mg Oral BID WC Darliss Ridgel, MD   500 mg at 08/21/17 1625  . famotidine (PEPCID) tablet 20 mg  20 mg Oral Daily Darliss Ridgel, MD   20 mg at 08/21/17 0823  . feeding supplement (ENSURE ENLIVE) (ENSURE ENLIVE) liquid 237 mL  237 mL Oral BID BM Darliss Ridgel, MD   237 mL at 08/21/17 1400  . hydrOXYzine (ATARAX/VISTARIL) tablet 50 mg  50 mg Oral TID PRN Darliss Ridgel, MD   50 mg at 08/20/17 2137  . ibuprofen (ADVIL,MOTRIN) tablet 600 mg  600 mg Oral Q6H PRN Darliss Ridgel, MD   600 mg at 08/19/17 1242  . loperamide (IMODIUM) capsule 2 mg  2 mg Oral PRN Darliss Ridgel, MD   2 mg at 08/19/17 1242  . magnesium hydroxide (MILK OF MAGNESIA) suspension 30 mL  30 mL Oral  Daily PRN Darliss Ridgel, MD      . neomycin-bacitracin-polymyxin (NEOSPORIN) ointment   Topical Q8H Darliss Ridgel, MD   1 application at 08/21/17 1428  . nicotine (NICODERM CQ - dosed in mg/24 hours) patch 21 mg  21 mg Transdermal Q0600 Leva Baine B, MD   21 mg at 08/21/17 9604  . [START ON 08/22/2017] prazosin (MINIPRESS) capsule 2 mg  2 mg Oral BID Angelika Jerrett B, MD      . QUEtiapine (SEROQUEL) tablet 600 mg  600 mg Oral QHS Verlaine Embry B, MD      . vitamin B-12 (CYANOCOBALAMIN) tablet 1,000 mcg  1,000 mcg Oral Daily Darliss Ridgel, MD   1,000 mcg at 08/21/17 5409    Lab Results: No results found for this or any previous visit (from the past 48 hour(s)).  Blood Alcohol level:  Lab Results  Component Value Date   ETH <5 07/24/2017   ETH <5 04/06/2017    Metabolic Disorder Labs: Lab Results  Component Value Date   HGBA1C 5.2 08/19/2017   MPG 102.54 08/19/2017   No results found for: PROLACTIN Lab Results  Component Value Date   CHOL 357 (H) 08/19/2017   TRIG 237 (H) 08/19/2017   HDL 37 (L) 08/19/2017   CHOLHDL 9.6 08/19/2017   VLDL 47 (H) 08/19/2017   LDLCALC 273 (H) 08/19/2017    Physical Findings: AIMS: Facial and Oral Movements Muscles of Facial Expression: None, normal Lips and Perioral Area: None, normal Jaw: None, normal Tongue: None, normal,Extremity Movements Upper (arms, wrists, hands, fingers): None, normal Lower (legs, knees, ankles, toes): None, normal, Trunk Movements Neck, shoulders, hips: None, normal, Overall Severity Severity of abnormal movements (highest score from questions above): None, normal Incapacitation due to abnormal movements: None, normal Patient's awareness of abnormal movements (rate only patient's report): No Awareness, Dental Status Current problems with teeth and/or dentures?: No Does patient usually wear dentures?: No  CIWA:  CIWA-Ar Total: 0 COWS:  COWS Total Score: 0  Musculoskeletal: Strength &  Muscle Tone: within normal limits Gait & Station: normal Patient leans: N/A  Psychiatric Specialty Exam: Physical Exam  Nursing note and vitals reviewed. Psychiatric: Her speech is normal and behavior is normal. Thought content normal. Her mood appears anxious. Cognition and memory are normal. She expresses impulsivity.  Review of Systems  Psychiatric/Behavioral: Positive for substance abuse.  All other systems reviewed and are negative.   Blood pressure 118/78, pulse 80, temperature 98.3 F (36.8 C), temperature source Oral, resp. rate 20, height  (1.676 m), weight 54.4 kg (120 lb), SpO2 100 %.Body mass index is 19.37 kg/m.  General Appearance: Casual  Eye Contact:  Good  Speech:  Clear and Coherent  Volume:  Normal  Mood:  Depressed  Affect:  Constricted and Tearful  Thought Process:  Goal Directed and Descriptions of Associations: Intact  Orientation:  Full (Time, Place, and Person)  Thought Content:  WDL  Suicidal Thoughts:  No  Homicidal Thoughts:  No  Memory:  Immediate;   Fair Recent;   Fair Remote;   Fair  Judgement:  Poor  Insight:  Lacking  Psychomotor Activity:  Normal  Concentration:  Concentration: Fair and Attention Span: Fair  Recall:  [employer] of Knowledge:  Fair  Language:  Fair  Akathisia:  No  Handed:  Right  AIMS (if indicated):     Assets:  Communication Skills Desire for Improvement Housing Physical Health Resilience Social Support  ADL's:  Intact  Cognition:  WNL  Sleep:  Number of Hours: 5.15     Treatment Plan Summary: Daily contact with patient to assess and evaluate symptoms and progress in treatment and Medication management   Ms. Steinberg is a 36 year old female with history of substance and mood instability transfer from medical floor where she was admitted for substance related delirium.  Mr. Revere is a 36 year old divorced Caucasian female with history of polysubstance use as well as bipolar disorder who was admitted  to the medical service and a delirious state in the context of multiple substances.   1. Suicidal ideation. The patient adamantly denies any thoughts, intentions, or plans for herself or others.  2. Mood. We will increase Seroquel tp 600 mg nightly for mood stabilization.   3. Metabolic syndrome monitoring. Lipid panel is elevated, her hemoglobin A1c normal.  4. EKG. Pending.  5. Pregnancy test is negative.  6. Substance abuse. The patient declines residential treatment but is interested in [facility] IOP program participation. We will try to refer her to [facility].  7. Hypertension. She is on Norvasc 10 mg daily, carvedilol 12.5 mg twice daily, and hydralazine 25 mg 3 times daily. Clonidine is also available.   8. Aspiration pneumonia. We continue Ceftin 500 mg twice daily for a week.  9. Vitamin B12 deficiency. We started supplementation.  10. GERD. She is on Pepcid.  11 smoking. Nicotine patch is available.  12. Disposition. To be established.Kristine Linea, MD 08/21/2017, 6:45 PM

## 2017-08-21 NOTE — Plan of Care (Signed)
Problem: Spiritual Needs Goal: Ability to function at adequate level Outcome: Progressing Patient is functioning at an adequate level.  Problem: Safety: Goal: Ability to remain free from injury will improve Outcome: Progressing Patient remains free from injury.

## 2017-08-22 DIAGNOSIS — G43909 Migraine, unspecified, not intractable, without status migrainosus: Secondary | ICD-10-CM | POA: Diagnosis present

## 2017-08-22 LAB — COMPREHENSIVE METABOLIC PANEL
ALK PHOS: 59 U/L (ref 38–126)
ALT: 23 U/L (ref 14–54)
AST: 20 U/L (ref 15–41)
Albumin: 4.3 g/dL (ref 3.5–5.0)
Anion gap: 8 (ref 5–15)
BILIRUBIN TOTAL: 0.5 mg/dL (ref 0.3–1.2)
BUN: 26 mg/dL — ABNORMAL HIGH (ref 6–20)
CALCIUM: 10.1 mg/dL (ref 8.9–10.3)
CO2: 24 mmol/L (ref 22–32)
CREATININE: 1.11 mg/dL — AB (ref 0.44–1.00)
Chloride: 105 mmol/L (ref 101–111)
Glucose, Bld: 98 mg/dL (ref 65–99)
Potassium: 4.3 mmol/L (ref 3.5–5.1)
Sodium: 137 mmol/L (ref 135–145)
TOTAL PROTEIN: 8.9 g/dL — AB (ref 6.5–8.1)

## 2017-08-22 MED ORDER — SUMATRIPTAN SUCCINATE 50 MG PO TABS: 50.0000 mg | ORAL_TABLET | ORAL | 0 refills | Status: AC | PRN

## 2017-08-22 MED ORDER — HYDROXYZINE HCL 50 MG PO TABS: 50.0000 mg | ORAL_TABLET | Freq: Three times a day (TID) | ORAL | 1 refills | Status: DC | PRN

## 2017-08-22 MED ORDER — ONDANSETRON HCL 4 MG PO TABS
4.0000 mg | ORAL_TABLET | Freq: Four times a day (QID) | ORAL | Status: DC | PRN
  Administered 2017-08-22 (×2): 4 mg via ORAL
  Filled 2017-08-22 (×3): qty 1

## 2017-08-22 MED ORDER — NALOXONE HCL 4 MG/0.1ML NA LIQD: NASAL | 1 refills | Status: AC

## 2017-08-22 MED ORDER — AMLODIPINE BESYLATE 10 MG PO TABS: 10.0000 mg | ORAL_TABLET | Freq: Every day | ORAL | 1 refills | Status: DC

## 2017-08-22 MED ORDER — CEFUROXIME AXETIL 500 MG PO TABS: 500.0000 mg | ORAL_TABLET | Freq: Two times a day (BID) | ORAL | 0 refills | Status: DC

## 2017-08-22 MED ORDER — PRAZOSIN HCL 2 MG PO CAPS: 2.0000 mg | ORAL_CAPSULE | Freq: Two times a day (BID) | ORAL | 1 refills | Status: AC

## 2017-08-22 MED ORDER — CARVEDILOL 12.5 MG PO TABS: 12.5000 mg | ORAL_TABLET | Freq: Two times a day (BID) | ORAL | 1 refills | Status: DC

## 2017-08-22 MED ORDER — CYANOCOBALAMIN 1000 MCG PO TABS: 1000.0000 ug | ORAL_TABLET | Freq: Every day | ORAL | 1 refills | Status: DC

## 2017-08-22 MED ORDER — QUETIAPINE FUMARATE 300 MG PO TABS: 600.0000 mg | ORAL_TABLET | Freq: Every day | ORAL | 1 refills | Status: DC

## 2017-08-22 MED ORDER — QUETIAPINE FUMARATE 300 MG PO TABS: 600.0000 mg | ORAL_TABLET | Freq: Every day | ORAL | 1 refills | Status: AC

## 2017-08-22 MED ORDER — ONDANSETRON HCL 4 MG PO TABS: 4.0000 mg | ORAL_TABLET | Freq: Four times a day (QID) | ORAL | Status: DC | PRN

## 2017-08-22 MED ORDER — SUMATRIPTAN SUCCINATE 50 MG PO TABS: 50.0000 mg | ORAL_TABLET | ORAL | 0 refills | Status: DC | PRN

## 2017-08-22 MED ORDER — SUMATRIPTAN SUCCINATE 6 MG/0.5ML ~~LOC~~ SOLN
6.0000 mg | Freq: Once | SUBCUTANEOUS | Status: AC
  Administered 2017-08-22: 6 mg via SUBCUTANEOUS
  Filled 2017-08-22: qty 0.5

## 2017-08-22 MED ORDER — PRAZOSIN HCL 2 MG PO CAPS: 2.0000 mg | ORAL_CAPSULE | Freq: Two times a day (BID) | ORAL | 1 refills | Status: DC

## 2017-08-22 MED ORDER — SUMATRIPTAN SUCCINATE 50 MG PO TABS: 50.0000 mg | ORAL_TABLET | ORAL | Status: DC | PRN

## 2017-08-22 NOTE — Plan of Care (Signed)
Problem: Spiritual Needs Goal: Ability to function at adequate level Outcome: Progressing Patient has the ability to function at an adequate level  Problem: Safety: Goal: Ability to remain free from injury will improve Outcome: Progressing Patient remains free from injury.

## 2017-08-22 NOTE — [facility] Group Notes (Signed)
[facility] Group Notes:  (Nursing/MHT/Case Management/Adjunct)  Date:  08/22/2017  Time:  7:12 PM  Type of Therapy:  Psychoeducational Skills  Participation Level:  Active  Participation Quality:  Appropriate and Attentive  Affect:  Appropriate  Cognitive:  Appropriate  Insight:  Appropriate and Good  Engagement in Group:  Engaged and Improving  Modes of Intervention:  Discussion  Summary of Progress/Problems:  [name] 08/22/2017, 7:12 PM

## 2017-08-22 NOTE — Progress Notes (Signed)
  [facility] Adult Case Management Discharge Plan :  Will you be returning to the same living situation after discharge:  Yes,    At discharge, do you have transportation home?: Yes,    Do you have the ability to pay for your medications: No.Referred to [facility]  Release of information consent forms completed and in the chart;  Patient's signature needed at discharge.  Patient to Follow up at: Follow-up Information    [facility] Go on 08/24/2017.   Why:  7:15am, for hospital follow up and Peer services with Unk Pinto, [phone] Contact information: [address] [place] [address] [phone]           Next level of care provider has access to [facility] Link:no  Safety Planning and Suicide Prevention discussed: Yes,     Have you used any form of tobacco in the last 30 days? (Cigarettes, Smokeless Tobacco, Cigars, and/or Pipes): Yes  Has patient been referred to the Quitline?: Patient refused referral  Patient has been referred for addiction treatment: Yes  Glennon Mac, LCSW 08/22/2017, 3:32 PM

## 2017-08-22 NOTE — Progress Notes (Signed)
Pt is pleasant. Interacts with peers and staff appropriately, very assertive. Pt is med compliant, no adverse affects noted. Denies SI, HI or A/V hallucinations. Contracted to safety. Denies pain or discomfort. Pt did attend wrap up group. 15 min safety checks continues.

## 2017-08-22 NOTE — Progress Notes (Signed)
[facility] MD Progress Note  08/22/2017 3:52 PM Sharryn Mehak Roskelley  MRN:  [id number]  Subjective:  Ms. Sayavong is a 36 year old female with a history of mood instability and substance abuse admitted from medical floor where she was hospitalized after overdose on drugs.   Today Ms. Shackett reports much improvement. She slept very well with a higher dose of Seroquel. She still complains of nightmares and flashbacks. We started Minipress today which she seems to tolerate well. There are there is no sedation from Seroquel. She complains of nausea daily for which she was prescribed Zofran. It will be from withdrawals she is no longer suicidal or homicidal. She denies any symptoms of depression, anxiety, or psychosis. She participates in programming. She met with [facility] representative. To discuss outpatient substance abuse treatment program participation. The patient seems much interested.  Treatment plan. We will continue Seroquel 600 mg nightly and Minipress 2 mg twice daily for nightmares and flashbacks. We will prescribe Narcan at the time of admission.  Social/disposition. The patient will be discharged with her friends. She assures that this is a safe environment. She will follow up with [facility] IOP at [facility]. She is not interested in Suboxone treatment.  Past psychiatric history. Long history of substance use and mood instability. She was diagnosed with bipolar disorder. There is history of treatment noncompliance.  Family Psychiatric history. History of bipolar disorder.  Principal Proble Diagm:nosis:   Patient Active Problem List   Diagnosis Date Noted  . Moderate mixed bipolar I disorder ([place]) [F31.62] 08/18/2017  . Bipolar I disorder, most recent episode depressed ([place]) [F31.30] 08/18/2017  . Opioid dependence with intoxication delirium ([place]) [F11.221]   . Cocaine abuse with cocaine-induced mood disorder ([place]) [F14.14]   . Drug overdose [T50.901A]   . HCAP (healthcare-associated pneumonia) [J18.9]  08/15/2017  . Acute respiratory failure with hypoxia ([place]) [J96.01] 08/15/2017  . Acute respiratory failure with hypoxia and hypercapnia (HCC) [J96.01, J96.02] 08/15/2017  . Acute renal failure ([place]) [N17.9]   . Altered mental status [R41.82] 07/24/2017  . Delirium, drug-induced ([place]) [W09.811] 07/24/2017  . Opiate abuse, continuous ([place]) [F11.10] 07/24/2017  . 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 Severe sepsis ([place]) [A41.9, R65.20] 07/16/2016  . AKI (acute kidney injury) ([place]) [N17.9] 07/16/2016  . Obtundation [R40.1] 07/16/2016  . Elevated lactic acid level [R79.89] 07/16/2016  . Drug abuse, IV ([place]) [F19.10] 07/16/2016  . Elevated LFTs [R94.5] 07/16/2016  . Sepsis ([place]) [A41.9] 07/10/2015  . Oligohydramnios [O41.00X0] 05/18/2015  . S/P cesarean section [Z98.891] 05/18/2015  . Preterm uterine contractions [O47.9] 05/17/2015  . Oligohydramnios antepartum [O41.00X0] 05/17/2015  . H/O cesarean section complicating pregnancy [B14.782] 05/17/2015  . Preterm contractions [O47.9] 05/17/2015  . AA (alcohol abuse) [F10.10] 12/08/2014  . Drug abuse during pregnancy [facility]) [N56.213, F19.10] 12/08/2014  . High risk sexual behavior [Z72.51] 12/08/2014  . Tenosynovitis [M65.9] 08/19/2014  . Acute deep vein thrombosis of arm (HCC) [I82.629] 08/19/2014  . HCV (hepatitis C virus) [B19.20] 07/17/2013  . H/O cesarean section [Z98.891] 05/18/2013  . Herpes infection in pregnancy [O98.519, B00.9] 05/18/2013  . Infection with methicillin-resistant Staphylococcus aureus [A49.02] 03/19/2013  . Cannabis abuse [F12.10] 03/18/2013  . Polysubstance abuse ([place]) [F19.10] 03/18/2013  . Compulsive tobacco user syndrome [F17.200] 03/17/2013   Total Time spent with patient: 30 minutes  Past Medical History:  Past Medical History:  Diagnosis Date  . Acute deep vein thrombosis of arm ([place]) 08/19/2014   Last Assessment & Plan:  - diagnosed 08/2014 in hospital - s/p heparin gtt -  patient never took  her xarelto x 3 month course that was planned - no symptoms today.  No further treatment - we discussed avoiding IVDU (see below) to reduce her chances of provoked upper extremity DVT   . Asthma   . HCV (hepatitis C virus) 07/17/2013   Last Assessment & Plan:  - positive antibody most recently 08/2014 - hep C RNA 08/2014 was negative - prior to that, HCV RNA [id number] in 06/2013. No record for genotype or imaging.   - patient counseled that she could become re-infected with continued IVDU or unprotected sexual encounters.   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   Last Assessment & Plan:  Discussed routine use of Valtrex prophylaxis at 36 weeks. She will be delivered by cesarean at 36-37 weeks.   . Infection with methicillin-resistant Staphylococcus aureus 03/19/2013   Overview: Arm abscess with MRSA, treated.  Subsequent screening negative  . IV drug abuse ([place])   . Tenosynovitis 08/19/2014    Past Surgical History:  Procedure Laterality Date  . CESAREAN SECTION    . CESAREAN SECTION N/A 05/18/2015   Procedure: CESAREAN SECTION;  Surgeon: Rubie Maid, MD;  Location: [facility] ORS;  Service: Obstetrics;  Laterality: N/A;  . NECK SURGERY     Fusion   Family History:  Family History  Problem Relation Age of Onset  . Diabetes Mother   . COPD Mother   . Hypertension Mother   . Lung cancer Mother   . Alcohol abuse Father   . Lung cancer Maternal Grandmother    Social History:  History  Alcohol Use  . 0.0 oz/week    Comment: bootlegger x2, patient states that she rarely drinks     History  Drug Use  . Types: Marijuana, Cocaine, Heroin, IV, Amphetamines, "Crack" cocaine    Comment: uses IV drugs, heroin    Social History   Social History  . Marital status: Single    Spouse name: N/A  . Number of children: N/A  . Years of education: N/A   Social History Main Topics  . Smoking status: Current Every Day Smoker    Packs/day: 1.00     Years: 7.00    Types: Cigarettes  . Smokeless tobacco: Never Used     Comment: Patient refused  . Alcohol use 0.0 oz/week     Comment: bootlegger x2, patient states that she rarely drinks  . Drug use: Yes    Types: Marijuana, Cocaine, Heroin, IV, Amphetamines, "Crack" cocaine     Comment: uses IV drugs, heroin  . Sexual activity: Yes    Birth control/ protection: Injection     Comment: Patient stated that she is on birth control, injection. She could not remember the name of  it.   Other Topics Concern  . None   Social History Narrative   Lives at home with mother   Additional Social History:    History of alcohol / drug use?: No history of alcohol / drug abuse Longest period of sobriety (when/how long): "I can't remember" Negative Consequences of Use: Legal, Personal relationships, Financial Withdrawal Symptoms: Agitation                    Sleep: Fair  Appetite:  Fair  Current Medications: Current Facility-Administered Medications  Medication Dose Route Frequency Provider Last Rate Last Dose  . acetaminophen (TYLENOL) tablet 650 mg  650 mg Oral Q4H PRN Chauncey Mann, MD  650 mg at 08/19/17 0927  . alum & mag hydroxide-simeth (MAALOX/MYLANTA) 200-200-20 MG/5ML suspension 30 mL  30 mL Oral Q4H PRN Chauncey Mann, MD   30 mL at 08/20/17 1214  . amLODipine (NORVASC) tablet 10 mg  10 mg Oral Daily Chauncey Mann, MD   10 mg at 08/22/17 0743  . carvedilol (COREG) tablet 12.5 mg  12.5 mg Oral BID WC Chauncey Mann, MD   12.5 mg at 08/22/17 0743  . cefUROXime (CEFTIN) tablet 500 mg  500 mg Oral BID WC Chauncey Mann, MD   500 mg at 08/22/17 0744  . famotidine (PEPCID) tablet 20 mg  20 mg Oral Daily Chauncey Mann, MD   20 mg at 08/22/17 0743  . feeding supplement (ENSURE ENLIVE) (ENSURE ENLIVE) liquid 237 mL  237 mL Oral BID BM Chauncey Mann, MD   237 mL at 08/21/17 1400  . hydrOXYzine (ATARAX/VISTARIL) tablet 50 mg  50 mg Oral TID PRN Chauncey Mann, MD   50 mg at  08/21/17 2225  . ibuprofen (ADVIL,MOTRIN) tablet 600 mg  600 mg Oral Q6H PRN Chauncey Mann, MD   600 mg at 08/22/17 1547  . loperamide (IMODIUM) capsule 2 mg  2 mg Oral PRN Chauncey Mann, MD   2 mg at 08/19/17 1242  . magnesium hydroxide (MILK OF MAGNESIA) suspension 30 mL  30 mL Oral Daily PRN Chauncey Mann, MD      . neomycin-bacitracin-polymyxin (NEOSPORIN) ointment   Topical Q8H Chauncey Mann, MD   1 application at 12/75/17 2100  . nicotine (NICODERM CQ - dosed in mg/24 hours) patch 21 mg  21 mg Transdermal Q0600 Lomax Poehler B, MD   21 mg at 08/22/17 0745  . ondansetron (ZOFRAN) tablet 4 mg  4 mg Oral Q6H PRN Anelle Parlow B, MD   4 mg at 08/22/17 1245  . prazosin (MINIPRESS) capsule 2 mg  2 mg Oral BID Lindzy Rupert B, MD   2 mg at 08/22/17 0744  . QUEtiapine (SEROQUEL) tablet 600 mg  600 mg Oral QHS Moishe Schellenberg B, MD   600 mg at 08/21/17 2101  . vitamin B-12 (CYANOCOBALAMIN) tablet 1,000 mcg  1,000 mcg Oral Daily Chauncey Mann, MD   1,000 mcg at 08/22/17 0017    Lab Results:  Results for orders placed or performed during the hospital encounter of 08/18/17 (from the past 48 hour(s))  Comprehensive metabolic panel     Status: Abnormal   Collection Time: 08/22/17  7:40 AM  Result Value Ref Range   Sodium 137 135 - 145 mmol/L   Potassium 4.3 3.5 - 5.1 mmol/L   Chloride 105 101 - 111 mmol/L   CO2 24 22 - 32 mmol/L   Glucose, Bld 98 65 - 99 mg/dL   BUN 26 (H) 6 - 20 mg/dL   Creatinine, Ser 1.11 (H) 0.44 - 1.00 mg/dL   Calcium 10.1 8.9 - 10.3 mg/dL   Total Protein 8.9 (H) 6.5 - 8.1 g/dL   Albumin 4.3 3.5 - 5.0 g/dL   AST 20 15 - 41 U/L   ALT 23 14 - 54 U/L   Alkaline Phosphatase 59 38 - 126 U/L   Total Bilirubin 0.5 0.3 - 1.2 mg/dL   GFR calc non Af Amer >60 >60 mL/min   GFR calc Af Amer >60 >60 mL/min    Comment: (NOTE) The eGFR has been calculated using the CKD EPI equation. This calculation has not been  validated in all clinical  situations. eGFR's persistently <60 mL/min signify possible Chronic Kidney Disease.    Anion gap 8 5 - 15    Blood Alcohol level:  Lab Results  Component Value Date   ETH <5 07/24/2017   ETH <5 97/98/9211    Metabolic Disorder Labs: Lab Results  Component Value Date   HGBA1C 5.2 08/19/2017   MPG 102.54 08/19/2017   No results found for: PROLACTIN Lab Results  Component Value Date   CHOL 357 (H) 08/19/2017   TRIG 237 (H) 08/19/2017   HDL 37 (L) 08/19/2017   CHOLHDL 9.6 08/19/2017   VLDL 47 (H) 08/19/2017   LDLCALC 273 (H) 08/19/2017    Physical Findings: AIMS: Facial and Oral Movements Muscles of Facial Expression: None, normal Lips and Perioral Area: None, normal Jaw: None, normal Tongue: None, normal,Extremity Movements Upper (arms, wrists, hands, fingers): None, normal Lower (legs, knees, ankles, toes): None, normal, Trunk Movements Neck, shoulders, hips: None, normal, Overall Severity Severity of abnormal movements (highest score from questions above): None, normal Incapacitation due to abnormal movements: None, normal Patient's awareness of abnormal movements (rate only patient's report): No Awareness, Dental Status Current problems with teeth and/or dentures?: No Does patient usually wear dentures?: No  CIWA:  CIWA-Ar Total: 0 COWS:  COWS Total Score: 0  Musculoskeletal: Strength & Muscle Tone: within normal limits Gait & Station: normal Patient leans: N/A  Psychiatric Specialty Exam: Physical Exam  Nursing note and vitals reviewed. Psychiatric: She has a normal mood and affect. Her speech is normal and behavior is normal. Thought content normal. Cognition and memory are normal. She expresses impulsivity.    Review of Systems  Neurological: Negative.   Psychiatric/Behavioral: Positive for substance abuse.  All other systems reviewed and are negative.   Blood pressure 130/76, pulse 74, temperature 98.3 F (36.8 C), resp. rate 20, height 5' 6"  (1.676  m), weight 54.4 kg (120 lb), SpO2 100 %.Body mass index is 19.37 kg/m.  General Appearance: Casual  Eye Contact:  Good  Speech:  Clear and Coherent  Volume:  Normal  Mood:  Euthymic  Affect:  Appropriate  Thought Process:  Goal Directed and Descriptions of Associations: Intact  Orientation:  Full (Time, Place, and Person)  Thought Content:  WDL  Suicidal Thoughts:  No  Homicidal Thoughts:  No  Memory:  Immediate;   Fair Recent;   Fair Remote;   Fair  Judgement:  Poor  Insight:  Lacking  Psychomotor Activity:  Normal  Concentration:  Concentration: Fair and Attention Span: Fair  Recall:  [employer] of Knowledge:  Fair  Language:  Fair  Akathisia:  No  Handed:  Right  AIMS (if indicated):     Assets:  Communication Skills Desire for Improvement Housing Physical Health Resilience Social Support  ADL's:  Intact  Cognition:  WNL  Sleep:  Number of Hours: 7     Treatment Plan Summary: Daily contact with patient to assess and evaluate symptoms and progress in treatment and Medication management   Ms. Chesbro is a 36 year old female with history of substance and mood instability transfer from medical floor where she was admitted for substance related delirium.  Mr. Screws is a 36 year old divorced Caucasian female with history of polysubstance use as well as bipolar disorder who was admitted to the medical service and a delirious state in the context of multiple substances.   1. Suicidal ideation. The patient adamantly denies any thoughts, intentions, or plans for herself or others.  2.  Mood. We will continue Seroquel at 600 mg nightly for mood stabilization and start Minipress 2 mg twice daily for nightmares and flashbacks of PTSD.   3. Metabolic syndrome monitoring. Lipid panel is elevated, her hemoglobin A1c normal.  4. EKG. Normal sinus rhythm, QTC 449.  5. Pregnancy test is negative.  6. Substance abuse. The patient declines residential treatment but is  interested in [facility] IOP program participation. We will try to refer her to [facility].  7. Hypertension. She is on Norvasc 10 mg daily, carvedilol 12.5 mg twice daily, and hydralazine 25 mg 3 times daily. Clonidine is also available.   8. Aspiration pneumonia. We continue Ceftin 500 mg twice daily for a week.  9. Vitamin B12 deficiency. We started supplementation.  10. GERD. She is on Pepcid.  11 smoking. Nicotine patch is available.  12. Disposition. The patient will be discharged with a family friend tomorrow. She will follow up with [facility] for medication management and substance abuse treatment.  Orson Slick, MD 08/22/2017, 3:52 PM

## 2017-08-22 NOTE — Plan of Care (Signed)
Problem: Coping: Goal: Ability to verbalize feelings will improve Outcome: Progressing Pt will verbalize feelings improvement this shift.

## 2017-08-22 NOTE — Progress Notes (Signed)
Patient alert and oriented x 4. Up ad lib ambulating in hallway with steady gait. Denies SI/HI/AVH, but endorses feeling nauseous.  New order obtained from MD  for Zofran 4 mg to be given q 6 hours prn for nausea. Patient participated in group this morning. PO fluids encouraged and provided to patient. Patient will be monitored through out the shift.

## 2017-08-23 DIAGNOSIS — E538 Deficiency of other specified B group vitamins: Secondary | ICD-10-CM | POA: Diagnosis present

## 2017-08-23 NOTE — [facility] Group Notes (Signed)
[facility] LCSW Group Therapy Note  Date/Time: 08/23/17, 0930  Type of Therapy/Topic:  Group Therapy:  Balance in Life  Participation Level:  Did not attend  Description of Group:    This group will address the concept of balance and how it feels and looks when one is unbalanced. Patients will be encouraged to process areas in their lives that are out of balance, and identify reasons for remaining unbalanced. Facilitators will guide patients utilizing problem- solving interventions to address and correct the stressor making their life unbalanced. Understanding and applying boundaries will be explored and addressed for obtaining  and maintaining a balanced life. Patients will be encouraged to explore ways to assertively make their unbalanced needs known to significant others in their lives, using other group members and facilitator for support and feedback.  Therapeutic Goals: 1. Patient will identify two or more emotions or situations they have that consume much of in their lives. 2. Patient will identify signs/triggers that life has become out of balance:  3. Patient will identify two ways to set boundaries in order to achieve balance in their lives:  4. Patient will demonstrate ability to communicate their needs through discussion and/or role plays  Summary of Patient Progress:          Therapeutic Modalities:   Cognitive Behavioral Therapy Solution-Focused Therapy Assertiveness Training  Greg Adianna Darwin, LCSW 

## 2017-08-23 NOTE — Progress Notes (Addendum)
Patient took all morning medications without difficulty, Patient is bright and excited to go home, but states she is afraid also,because she is does not want to use drugs again, and hopes she can with stand temptations.  Patient does have insight into her disease, and wants to go to a 12 step program, states that her sister will come to pick her up to day, and her family is supportive. Patient denies si/hi or avh. Patient with q 15 minute checks in progress.

## 2017-08-23 NOTE — Progress Notes (Signed)
D: Pt denies SI/HI/AVH. Pt is pleasant and cooperative. Pt stated she was complaining of N/V all day and HA. Pt stayed to her room most of the evening , but came out to take her night medications, pt stated she was ready to leave tomorrow  A: Pt was offered support and encouragement. Pt was given scheduled medications. Pt was encourage to attend groups. Q 15 minute checks were done for safety.   R: Pt is taking medication. Pt receptive to treatment and safety maintained on unit.

## 2017-08-23 NOTE — [facility] Suicide Risk Assessment (Signed)
[facility] Discharge Suicide Risk Assessment   Principal Problem: Bipolar I disorder, most recent episode depressed [facility]) Discharge Diagnoses:  Patient Active Problem List   Diagnosis Date Noted  . Vitamin B12 deficiency [E53.8] 08/23/2017  . Migraine [G43.909] 08/22/2017  . Moderate mixed bipolar I disorder (HCC) [F31.62] 08/18/2017  . Bipolar I disorder, most recent episode depressed (HCC) [F31.30] 08/18/2017  . Opioid dependence with intoxication delirium (HCC) [F11.221]   . Cocaine abuse with cocaine-induced mood disorder (HCC) [F14.14]   . Drug overdose [T50.901A]   . HCAP (healthcare-associated pneumonia) [J18.9] 08/15/2017  . Acute respiratory failure with hypoxia (HCC) [J96.01] 08/15/2017  . Acute respiratory failure with hypoxia and hypercapnia (HCC) [J96.01, J96.02] 08/15/2017  . Acute renal failure (HCC) [N17.9]   . Altered mental status [R41.82] 07/24/2017  . Delirium, drug-induced (HCC) [R60.454] 07/24/2017  . Opiate abuse, continuous (HCC) [F11.10] 07/24/2017  . 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 Severe sepsis (HCC) [A41.9, R65.20] 07/16/2016  . AKI (acute kidney injury) (HCC) [N17.9] 07/16/2016  . Obtundation [R40.1] 07/16/2016  . Elevated lactic acid level [R79.89] 07/16/2016  . 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 Elevated LFTs [R94.5] 07/16/2016  . Sepsis (HCC) [A41.9] 07/10/2015  . Oligohydramnios [O41.00X0] 05/18/2015  . S/P cesarean section [Z98.891] 05/18/2015  . Preterm uterine contractions [O47.9] 05/17/2015  . Oligohydramnios antepartum [O41.00X0] 05/17/2015  . H/O cesarean section complicating pregnancy [O34.219] 05/17/2015  . Preterm contractions [O47.9] 05/17/2015  . AA (alcohol abuse) [F10.10] 12/08/2014  . Drug abuse during pregnancy [facility]) [U98.119, F19.10] 12/08/2014  . High risk sexual behavior [Z72.51] 12/08/2014  . Tenosynovitis [M65.9] 08/19/2014  . Acute deep vein thrombosis of arm (HCC) [I82.629] 08/19/2014  . HCV (hepatitis  C virus) [B19.20] 07/17/2013  . H/O cesarean section [Z98.891] 05/18/2013  . Herpes infection in pregnancy [O98.519, B00.9] 05/18/2013  . Infection with methicillin-resistant Staphylococcus aureus [A49.02] 03/19/2013  . Cannabis abuse [F12.10] 03/18/2013  . Polysubstance abuse (HCC) [F19.10] 03/18/2013  . Compulsive tobacco user syndrome [F17.200] 03/17/2013    Total Time spent with patient: 30 minutes  Musculoskeletal: Strength & Muscle Tone: within normal limits Gait & Station: normal Patient leans: N/A  Psychiatric Specialty Exam: Review of Systems  Gastrointestinal: Positive for nausea.  Neurological: Positive for headaches.  Psychiatric/Behavioral: Positive for substance abuse.  All other systems reviewed and are negative.   Blood pressure 126/72, pulse 76, temperature 98.6 F (37 C), temperature source Oral, resp. rate 18, height  (1.676 m), weight 54.4 kg (120 lb), SpO2 100 %.Body mass index is 19.37 kg/m.  General Appearance: Casual  Eye Contact::  Good  Speech:  Clear and Coherent409  Volume:  Normal  Mood:  Euthymic  Affect:  Appropriate  Thought Process:  Goal Directed and Descriptions of Associations: Intact  Orientation:  Full (Time, Place, and Person)  Thought Content:  WDL  Suicidal Thoughts:  No  Homicidal Thoughts:  No  Memory:  Immediate;   Fair Recent;   Fair Remote;   Fair  Judgement:  Poor  Insight:  Shallow  Psychomotor Activity:  Normal  Concentration:  Fair  Recall:  [employer] of Knowledge:Fair  Language: Fair  Akathisia:  No  Handed:  Right  AIMS (if indicated):     Assets:  Communication Skills Desire for Improvement Housing Physical Health Resilience Social Support  Sleep:  Number of Hours: 6.45  Cognition: WNL  ADL's:  Intact   Mental Status Per Nursing Assessment::   On Admission:     Demographic Factors:  Caucasian and Unemployed  Loss Factors: Decline in physical health and Financial problems/change in  socioeconomic status  Historical Factors: Prior suicide attempts, Family history of mental illness or substance abuse and Impulsivity  Risk Reduction Factors:   Sense of responsibility to family, Living with another person, especially a relative and Positive social support  Continued Clinical Symptoms:  Bipolar Disorder:   Depressive phase Alcohol/Substance Abuse/Dependencies  Cognitive Features That Contribute To Risk:  None    Suicide Risk:  Minimal: No identifiable suicidal ideation.  Patients presenting with no risk factors but with morbid ruminations; may be classified as minimal risk based on the severity of the depressive symptoms  Follow-up Information    [employer] Go on 08/24/2017.   Why:  7:15am, for hospital follow up and Peer services with [facility], [phone] Contact information: [address] [place] [address] [phone]           Plan Of Care/Follow-up recommendations:  Activity:  as tolerated. Diet:  regular. Other:  keep follow up appointments.  Kristine Linea, MD 08/23/2017, 7:26 AM
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
## 2017-08-23 NOTE — Progress Notes (Signed)
Patient left with all belongings, Patient signed discharged instructions and voiced understanding of discharge, patient left with friend to return home, no signs of distress.

## 2017-09-15 ENCOUNTER — Inpatient Hospital Stay
Admission: EM | Admit: 2017-09-15 | Discharge: 2017-09-17 | DRG: 917 | Disposition: A | Discharge status: Home / Self Care | Payer: Self-pay | Attending: Internal Medicine | Admitting: Internal Medicine

## 2017-09-15 ENCOUNTER — Emergency Department: Payer: Self-pay

## 2017-09-15 DIAGNOSIS — R740 Nonspecific elevation of levels of transaminase and lactic acid dehydrogenase [LDH]: Secondary | ICD-10-CM | POA: Diagnosis present

## 2017-09-15 DIAGNOSIS — F1410 Cocaine abuse, uncomplicated: Secondary | ICD-10-CM | POA: Diagnosis present

## 2017-09-15 DIAGNOSIS — Z23 Encounter for immunization: Secondary | ICD-10-CM

## 2017-09-15 DIAGNOSIS — N179 Acute kidney failure, unspecified: Secondary | ICD-10-CM | POA: Diagnosis present

## 2017-09-15 DIAGNOSIS — Z880 Allergy status to penicillin: Secondary | ICD-10-CM

## 2017-09-15 DIAGNOSIS — R451 Restlessness and agitation: Secondary | ICD-10-CM | POA: Diagnosis present

## 2017-09-15 DIAGNOSIS — M6282 Rhabdomyolysis: Secondary | ICD-10-CM | POA: Diagnosis present

## 2017-09-15 DIAGNOSIS — G92 Toxic encephalopathy: Secondary | ICD-10-CM | POA: Diagnosis present

## 2017-09-15 DIAGNOSIS — J9601 Acute respiratory failure with hypoxia: Secondary | ICD-10-CM | POA: Diagnosis present

## 2017-09-15 DIAGNOSIS — Z9911 Dependence on respirator [ventilator] status: Secondary | ICD-10-CM

## 2017-09-15 DIAGNOSIS — F329 Major depressive disorder, single episode, unspecified: Secondary | ICD-10-CM | POA: Diagnosis present

## 2017-09-15 DIAGNOSIS — I10 Essential (primary) hypertension: Secondary | ICD-10-CM | POA: Diagnosis present

## 2017-09-15 DIAGNOSIS — R410 Disorientation, unspecified: Secondary | ICD-10-CM

## 2017-09-15 DIAGNOSIS — T405X4A Poisoning by cocaine, undetermined, initial encounter: Principal | ICD-10-CM | POA: Diagnosis present

## 2017-09-15 DIAGNOSIS — R569 Unspecified convulsions: Secondary | ICD-10-CM | POA: Diagnosis present

## 2017-09-15 DIAGNOSIS — J9600 Acute respiratory failure, unspecified whether with hypoxia or hypercapnia: Secondary | ICD-10-CM

## 2017-09-15 DIAGNOSIS — E871 Hypo-osmolality and hyponatremia: Secondary | ICD-10-CM | POA: Diagnosis present

## 2017-09-15 DIAGNOSIS — T50904A Poisoning by unspecified drugs, medicaments and biological substances, undetermined, initial encounter: Secondary | ICD-10-CM

## 2017-09-15 DIAGNOSIS — E872 Acidosis: Secondary | ICD-10-CM | POA: Diagnosis present

## 2017-09-15 DIAGNOSIS — E860 Dehydration: Secondary | ICD-10-CM | POA: Diagnosis present

## 2017-09-15 DIAGNOSIS — G9340 Encephalopathy, unspecified: Secondary | ICD-10-CM

## 2017-09-15 DIAGNOSIS — T405X1A Poisoning by cocaine, accidental (unintentional), initial encounter: Secondary | ICD-10-CM | POA: Diagnosis present

## 2017-09-15 DIAGNOSIS — E876 Hypokalemia: Secondary | ICD-10-CM | POA: Diagnosis present

## 2017-09-15 DIAGNOSIS — Y92009 Unspecified place in unspecified non-institutional (private) residence as the place of occurrence of the external cause: Secondary | ICD-10-CM

## 2017-09-15 DIAGNOSIS — F1120 Opioid dependence, uncomplicated: Secondary | ICD-10-CM | POA: Diagnosis present

## 2017-09-15 DIAGNOSIS — R5383 Other fatigue: Secondary | ICD-10-CM | POA: Diagnosis present

## 2017-09-15 DIAGNOSIS — Z91040 Latex allergy status: Secondary | ICD-10-CM

## 2017-09-15 DIAGNOSIS — T43624A Poisoning by amphetamines, undetermined, initial encounter: Secondary | ICD-10-CM | POA: Diagnosis present

## 2017-09-15 DIAGNOSIS — Z881 Allergy status to other antibiotic agents status: Secondary | ICD-10-CM

## 2017-09-15 LAB — HEPATIC FUNCTION PANEL
ALBUMIN: 3.6 g/dL (ref 3.5–5.0)
ALT: 34 U/L (ref 14–54)
AST: 79 U/L — AB (ref 15–41)
Alkaline Phosphatase: 103 U/L (ref 38–126)
TOTAL PROTEIN: 7.5 g/dL (ref 6.5–8.1)
Total Bilirubin: 0.9 mg/dL (ref 0.3–1.2)

## 2017-09-15 LAB — URINE DRUG SCREEN, QUALITATIVE (ARMC ONLY)
AMPHETAMINES, UR SCREEN: POSITIVE — AB
BARBITURATES, UR SCREEN: NOT DETECTED
BENZODIAZEPINE, UR SCRN: NOT DETECTED
COCAINE METABOLITE, UR ~~LOC~~: POSITIVE — AB
Cannabinoid 50 Ng, Ur ~~LOC~~: POSITIVE — AB
MDMA (Ecstasy)Ur Screen: NOT DETECTED
METHADONE SCREEN, URINE: NOT DETECTED
Opiate, Ur Screen: POSITIVE — AB
Phencyclidine (PCP) Ur S: NOT DETECTED
TRICYCLIC, UR SCREEN: NOT DETECTED

## 2017-09-15 LAB — URINALYSIS, COMPLETE (UACMP) WITH MICROSCOPIC
Bilirubin Urine: NEGATIVE
GLUCOSE, UA: NEGATIVE mg/dL
KETONES UR: NEGATIVE mg/dL
LEUKOCYTES UA: NEGATIVE
NITRITE: NEGATIVE
PROTEIN: 100 mg/dL — AB
Specific Gravity, Urine: 1.014 (ref 1.005–1.030)
Squamous Epithelial / LPF: NONE SEEN
pH: 6 (ref 5.0–8.0)

## 2017-09-15 LAB — CBC WITH DIFFERENTIAL/PLATELET
BASOS ABS: 0.1 10*3/uL (ref 0–0.1)
Basophils Relative: 1 %
EOS PCT: 1 %
Eosinophils Absolute: 0.1 10*3/uL (ref 0–0.7)
HCT: 25.2 % — ABNORMAL LOW (ref 35.0–47.0)
HEMOGLOBIN: 9.3 g/dL — AB (ref 12.0–16.0)
LYMPHS ABS: 1.2 10*3/uL (ref 1.0–3.6)
LYMPHS PCT: 16 %
MCH: 32.2 pg (ref 26.0–34.0)
MCHC: 37 g/dL — ABNORMAL HIGH (ref 32.0–36.0)
MCV: 87.1 fL (ref 80.0–100.0)
Monocytes Absolute: 0.4 10*3/uL (ref 0.2–0.9)
Monocytes Relative: 5 %
NEUTROS ABS: 5.8 10*3/uL (ref 1.4–6.5)
NEUTROS PCT: 77 %
PLATELETS: 326 10*3/uL (ref 150–440)
RBC: 2.9 MIL/uL — AB (ref 3.80–5.20)
RDW: 13.6 % (ref 11.5–14.5)
WBC: 7.6 10*3/uL (ref 3.6–11.0)

## 2017-09-15 LAB — MRSA PCR SCREENING: MRSA by PCR: NEGATIVE

## 2017-09-15 LAB — PREGNANCY, URINE: Preg Test, Ur: NEGATIVE

## 2017-09-15 LAB — BASIC METABOLIC PANEL
ANION GAP: 15 (ref 5–15)
ANION GAP: 12 (ref 5–15)
BUN: 37 mg/dL — AB (ref 6–20)
BUN: 30 mg/dL — ABNORMAL HIGH (ref 6–20)
CALCIUM: 8.6 mg/dL — AB (ref 8.9–10.3)
CHLORIDE: 99 mmol/L — AB (ref 101–111)
CO2: 19 mmol/L — ABNORMAL LOW (ref 22–32)
CO2: 19 mmol/L — ABNORMAL LOW (ref 22–32)
Calcium: 8.8 mg/dL — ABNORMAL LOW (ref 8.9–10.3)
Chloride: 105 mmol/L (ref 101–111)
Creatinine, Ser: 2.74 mg/dL — ABNORMAL HIGH (ref 0.44–1.00)
Creatinine, Ser: 2.21 mg/dL — ABNORMAL HIGH (ref 0.44–1.00)
GFR, EST AFRICAN AMERICAN: 11 mL/min — AB (ref >60)
GFR, EST AFRICAN AMERICAN: 32 mL/min — AB (ref >60)
GFR, EST NON AFRICAN AMERICAN: 10 mL/min — AB (ref >60)
GFR, EST NON AFRICAN AMERICAN: 28 mL/min — AB (ref >60)
GLUCOSE: 64 mg/dL — AB (ref 65–99)
Glucose, Bld: 83 mg/dL (ref 65–99)
POTASSIUM: 3 mmol/L — AB (ref 3.5–5.1)
POTASSIUM: 3.4 mmol/L — AB (ref 3.5–5.1)
SODIUM: 133 mmol/L — AB (ref 135–145)
SODIUM: 136 mmol/L (ref 135–145)

## 2017-09-15 LAB — BLOOD GAS, ARTERIAL
Acid-Base Excess: 0.1 mmol/L (ref 0.0–2.0)
BICARBONATE: 21.5 mmol/L (ref 20.0–28.0)
FIO2: 1
MECHVT: 500 mL
Mechanical Rate: 14
O2 SAT: 100 %
PCO2 ART: 24 mmHg — AB (ref 32.0–48.0)
PEEP: 5 cmH2O
PO2 ART: 527 mmHg — AB (ref 83.0–108.0)
Patient temperature: 37
pH, Arterial: 7.56 — ABNORMAL HIGH (ref 7.350–7.450)

## 2017-09-15 LAB — LACTIC ACID, PLASMA: LACTIC ACID, VENOUS: 4.2 mmol/L — AB (ref 0.5–1.9)

## 2017-09-15 LAB — CK: Total CK: 1816 U/L — ABNORMAL HIGH (ref 38–234)

## 2017-09-15 LAB — GLUCOSE, CAPILLARY: Glucose-Capillary: 86 mg/dL (ref 65–99)

## 2017-09-15 LAB — ACETAMINOPHEN LEVEL: Acetaminophen (Tylenol), Serum: <10 ug/mL — ABNORMAL LOW (ref 10–30)

## 2017-09-15 LAB — PROTIME-INR
INR: 1.13
Prothrombin Time: 14.4 seconds (ref 11.4–15.2)

## 2017-09-15 LAB — TSH: TSH: 0.521 u[IU]/mL (ref 0.350–4.500)

## 2017-09-15 LAB — SALICYLATE LEVEL: Salicylate Lvl: <7 mg/dL (ref 2.8–30.0)

## 2017-09-15 LAB — ETHANOL

## 2017-09-15 MED ORDER — DEXMEDETOMIDINE HCL IN NACL 400 MCG/100ML IV SOLN
INTRAVENOUS | Status: AC
  Administered 2017-09-15: 0.4 ug/kg/h via INTRAVENOUS
  Filled 2017-09-15: qty 100

## 2017-09-15 MED ORDER — LORAZEPAM 2 MG/ML IJ SOLN: 2.0000 mg | Freq: Once | INTRAMUSCULAR | Status: DC

## 2017-09-15 MED ORDER — ACETAMINOPHEN 650 MG RE SUPP: 650.0000 mg | Freq: Four times a day (QID) | RECTAL | Status: DC | PRN

## 2017-09-15 MED ORDER — HALOPERIDOL LACTATE 5 MG/ML IJ SOLN
5.0000 mg | Freq: Once | INTRAMUSCULAR | Status: AC
  Administered 2017-09-15: 5 mg via INTRAMUSCULAR

## 2017-09-15 MED ORDER — ROCURONIUM BROMIDE 50 MG/5ML IV SOSY
70.0000 mg | PREFILLED_SYRINGE | Freq: Once | INTRAVENOUS | Status: AC
  Administered 2017-09-15: 70 mg via INTRAVENOUS
  Filled 2017-09-15: qty 10

## 2017-09-15 MED ORDER — LORAZEPAM 2 MG/ML IJ SOLN
1.0000 mg | Freq: Once | INTRAMUSCULAR | Status: AC
  Administered 2017-09-15: 1 mg via INTRAVENOUS

## 2017-09-15 MED ORDER — VANCOMYCIN HCL IN DEXTROSE 1-5 GM/200ML-% IV SOLN
1000.0000 mg | Freq: Once | INTRAVENOUS | Status: AC
  Administered 2017-09-15: 1000 mg via INTRAVENOUS
  Filled 2017-09-15: qty 200

## 2017-09-15 MED ORDER — NALOXONE HCL 0.4 MG/ML IJ SOLN
INTRAMUSCULAR | Status: AC
  Filled 2017-09-15: qty 1

## 2017-09-15 MED ORDER — LORAZEPAM 2 MG/ML IJ SOLN
INTRAMUSCULAR | Status: AC
  Filled 2017-09-15: qty 1

## 2017-09-15 MED ORDER — NALOXONE HCL 0.4 MG/ML IJ SOLN
0.4000 mg | Freq: Once | INTRAMUSCULAR | Status: AC
  Administered 2017-09-15: 0.4 mg via INTRAVENOUS

## 2017-09-15 MED ORDER — SODIUM CHLORIDE 0.9 % IV SOLN
INTRAVENOUS | Status: DC
  Administered 2017-09-15: 07:00:00 via INTRAVENOUS

## 2017-09-15 MED ORDER — LORAZEPAM 2 MG/ML IJ SOLN
2.0000 mg | Freq: Once | INTRAMUSCULAR | Status: AC
  Administered 2017-09-15: 2 mg via INTRAMUSCULAR

## 2017-09-15 MED ORDER — DOCUSATE SODIUM 100 MG PO CAPS: 100.0000 mg | ORAL_CAPSULE | Freq: Two times a day (BID) | ORAL | Status: DC

## 2017-09-15 MED ORDER — ETOMIDATE 2 MG/ML IV SOLN
30.0000 mg | Freq: Once | INTRAVENOUS | Status: AC
  Administered 2017-09-15: 30 mg via INTRAVENOUS

## 2017-09-15 MED ORDER — POTASSIUM CHLORIDE 10 MEQ/100ML IV SOLN
10.0000 meq | INTRAVENOUS | Status: AC
  Administered 2017-09-15 (×4): 10 meq via INTRAVENOUS
  Filled 2017-09-15 (×4): qty 100

## 2017-09-15 MED ORDER — ACETAMINOPHEN 325 MG PO TABS
650.0000 mg | ORAL_TABLET | Freq: Four times a day (QID) | ORAL | Status: DC | PRN
  Administered 2017-09-16: 650 mg via ORAL
  Filled 2017-09-15 (×2): qty 2

## 2017-09-15 MED ORDER — ONDANSETRON HCL 4 MG PO TABS: 4.0000 mg | ORAL_TABLET | Freq: Four times a day (QID) | ORAL | Status: DC | PRN

## 2017-09-15 MED ORDER — HEPARIN SODIUM (PORCINE) 5000 UNIT/ML IJ SOLN
5000.0000 [IU] | Freq: Three times a day (TID) | INTRAMUSCULAR | Status: DC
  Administered 2017-09-15 – 2017-09-16 (×3): 5000 [IU] via SUBCUTANEOUS
  Filled 2017-09-15 (×3): qty 1

## 2017-09-15 MED ORDER — ONDANSETRON HCL 4 MG/2ML IJ SOLN: 4.0000 mg | Freq: Four times a day (QID) | INTRAMUSCULAR | Status: DC | PRN

## 2017-09-15 MED ORDER — POTASSIUM CHLORIDE 2 MEQ/ML IV SOLN
INTRAVENOUS | Status: DC
  Administered 2017-09-15: 10:00:00 via INTRAVENOUS
  Filled 2017-09-15 (×3): qty 1000

## 2017-09-15 MED ORDER — SODIUM CHLORIDE 0.9 % IV BOLUS (SEPSIS)
1000.0000 mL | Freq: Once | INTRAVENOUS | Status: AC
  Administered 2017-09-15: 1000 mL via INTRAVENOUS

## 2017-09-15 MED ORDER — PROPOFOL 1000 MG/100ML IV EMUL
50.0000 ug/kg/min | Freq: Once | INTRAVENOUS | Status: DC
  Administered 2017-09-15: 50 ug/kg/min via INTRAVENOUS

## 2017-09-15 MED ORDER — PROPOFOL 1000 MG/100ML IV EMUL
50.0000 ug/kg/min | INTRAVENOUS | Status: DC
  Administered 2017-09-15: 50 ug/kg/min via INTRAVENOUS
  Filled 2017-09-15: qty 100

## 2017-09-15 MED ORDER — DEXMEDETOMIDINE HCL IN NACL 400 MCG/100ML IV SOLN: 0.0000 ug/kg/h | INTRAVENOUS | Status: DC

## 2017-09-15 MED ORDER — PIPERACILLIN-TAZOBACTAM 3.375 G IVPB 30 MIN
3.3750 g | Freq: Once | INTRAVENOUS | Status: AC
  Administered 2017-09-15: 3.375 g via INTRAVENOUS
  Filled 2017-09-15: qty 50

## 2017-09-15 MED ORDER — DEXMEDETOMIDINE HCL IN NACL 400 MCG/100ML IV SOLN
0.4000 ug/kg/h | INTRAVENOUS | Status: DC
  Administered 2017-09-15: 0.4 ug/kg/h via INTRAVENOUS

## 2017-09-15 MED ORDER — SODIUM CHLORIDE 0.9 % IV BOLUS (SEPSIS)
2000.0000 mL | Freq: Once | INTRAVENOUS | Status: AC
  Administered 2017-09-15: 2000 mL via INTRAVENOUS

## 2017-09-15 MED ORDER — NALOXONE HCL 0.4 MG/ML IJ SOLN: 0.4000 mg | INTRAMUSCULAR | Status: DC | PRN

## 2017-09-15 MED ORDER — KETAMINE HCL 50 MG/ML IJ SOLN
300.0000 mg | Freq: Once | INTRAMUSCULAR | Status: AC
  Administered 2017-09-15: 300 mg via INTRAMUSCULAR

## 2017-09-15 NOTE — Progress Notes (Signed)
[facility] [place] [facility]   PATIENT NAME: [name]    MR#:  [id number]  DATE OF BIRTH:  05/22/1981  SUBJECTIVE:  CHIEF COMPLAINT:   Chief Complaint  Patient presents with  . Drug Overdose   - patient admitted again for cocaine and amphetamine overdose. -Intubated this morning, currently extubated and on 2 L nasal cannula.  Still sedated with Precedex at this time -   REVIEW OF SYSTEMS:  Review of Systems  Unable to perform ROS: Mental status change    DRUG ALLERGIES:   Allergies  Allergen Reactions  . Latex   . Vancomycin   . Amoxicillin Rash  . Clindamycin/Lincomycin Rash    VITALS:  Blood pressure 106/62, pulse 66, temperature 98.4 F (36.9 C), temperature source Axillary, resp. rate 16, height 5\' 6"  (1.676 m), weight 55.1 kg (121 lb 7.6 oz), SpO2 100 %.  PHYSICAL EXAMINATION:  Physical Exam  GENERAL:  36 y.o.-year-old patient lying in the bed with no acute distress. Sedated. EYES: Pupils equal, round, reactive to light and accommodation. No scleral icterus. Extraocular muscles intact.  HEENT: Head atraumatic, normocephalic. Oropharynx and nasopharynx clear. But dry mucus membranes NECK:  Supple, no jugular venous distention. No thyroid enlargement, no tenderness.  LUNGS: Normal breath sounds bilaterally, no wheezing, rales,rhonchi or crepitation. No use of accessory muscles of respiration.  CARDIOVASCULAR: S1, S2 normal. No murmurs, rubs, or gallops.  ABDOMEN: Soft, nontender, nondistended. Bowel sounds present. No organomegaly or mass.  EXTREMITIES: No pedal edema, cyanosis, or clubbing.  NEUROLOGIC: sedated, not following commands. Able to move all extremities non purposefully  PSYCHIATRIC: The patient is sedated.  SKIN: No obvious rash, lesion, or ulcer.    LABORATORY PANEL:   CBC  Recent Labs Lab 09/15/17 0209  WBC 7.6  HGB 9.3*  HCT 25.2*  PLT 326    ------------------------------------------------------------------------------------------------------------------  Chemistries   Recent Labs Lab 09/15/17 0209  NA 133*  K 3.0*  CL 99*  CO2 19*  GLUCOSE 83  BUN 37*  CREATININE 2.74*  CALCIUM 8.8*  AST 79*  ALT 34  ALKPHOS 103  BILITOT 0.9   ------------------------------------------------------------------------------------------------------------------  Cardiac Enzymes No results for input(s): TROPONINI in the last 168 hours. ------------------------------------------------------------------------------------------------------------------  RADIOLOGY:  No results found.  EKG:   Orders placed or performed during the hospital encounter of 09/15/17  . ED EKG  . ED EKG  . EKG 12-Lead  . EKG 12-Lead    ASSESSMENT AND PLAN:   36 year old female with past medical history significant for cocaine abuse and polysubstance abuse and recent admission for drug overdose presents again to the hospital with cocaine overdose.  1.  Acute respiratory failure-intubated for airway protection, currently extubated and on 2 L nasal cannula -Follow chest x-rays to rule out any aspiration  2.  Polysubstance abuse with overdose-recent admission, transferred to behavioral medicine unit -Will need psych consult again prior to discharge  3.  Rhabdomyolysis and acute renal failure-continue IV fluids and monitor creatinine. -If no improvement, consider renal ultrasound and nephrology consult.  4.  Hypokalemia-recommend replacement  5.  DVT prophylaxis-subcutaneous heparin.  6.  Metabolic encephalopathy-secondary to drug overdose.  Currently on Precedex drip and sedated.  Wean as tolerated.  Mother updated at bedside.   All the records are reviewed and case discussed with Care Management/Social Workerr. Management plans discussed with the patient, family and they are in agreement.  CODE STATUS: Full Code  TOTAL TIME TAKING CARE OF  THIS PATIENT: 36 minutes.  POSSIBLE D/C IN 2-3 DAYS, DEPENDING ON CLINICAL CONDITION.   Enid Baas M.D on 09/15/2017 at 3:06 PM  Between 7am to 6pm - Pager - [phone]  After 6pm go to [email or web address] - password [email or web address]  [facility]  Office  [phone]  CC: Primary care physician; Patient, No Pcp Per

## 2017-09-15 NOTE — ED Notes (Signed)
Pt was intubated by Dr. Lamont Snowballifenbark at this time.  Pt was unable to be calmed down with IM medications, and pt became unable to manage own airway prior to intubation.  Pt was repeatedly biting her tongue while trying to calm patient down.

## 2017-09-15 NOTE — Progress Notes (Signed)
Patient extubated to 2lnc with no complications, sats 98% on 2l, no stridor noted, will continue to monitor.

## 2017-09-15 NOTE — ED Notes (Signed)
RT called to take patient to CCU

## 2017-09-15 NOTE — ED Provider Notes (Addendum)
[facility] Emergency Department Provider Note  ____________________________________________   First MD Initiated Contact with Patient 09/15/17 0130     (approximate)  I have reviewed the triage vital signs and the nursing notes.   HISTORY  Chief Complaint Drug Overdose  Level 5 exemption history limited by the patient's clinical condition  HPI [name] is a 36 y.o. female is brought to the emergency department with altered mental status.  History obtained from her friend at bedside as the patient is suffering from agitated delirium.  According to her friend the patient was using unknown drugs today and then went to her friend's house asking the friend to give her Narcan.  He gave her 4 mg intranasally and nearly immediately thereafter she became more wild and combative.  No past medical history on file.  There are no active problems to display for this patient.   No past surgical history on file.  Prior to Admission medications   Not on File    Allergies Patient has no known allergies.  No family history on file.  Social History Social History  Substance Use Topics  . Smoking status: Not on file  . Smokeless tobacco: Not on file  . Alcohol use Not on file    Review of Systems Level 5 exemption history limited by the patient's clinical condition  ____________________________________________   PHYSICAL EXAM:  VITAL SIGNS: ED Triage Vitals  Enc Vitals Group     BP      Pulse      Resp      Temp      Temp src      SpO2      Weight      Height      Head Circumference      Peak Flow      Pain Score      Pain Loc      Pain Edu?      Excl. in [place]?     Constitutional: In severe distress.  Agitated delirium.  Warm skin diaphoretic mumbling speech Eyes: PERRL EOMI. 8 mm pupils bilaterally and brisk Head: Atraumatic. Nose: No congestion/rhinnorhea. Mouth/Throat: Bleeding from her mouth Neck: No stridor.   Cardiovascular:  Tachycardic rate, regular rhythm. Grossly normal heart sounds.  Good peripheral circulation. Respiratory: Increased respiratory effort.  No retractions. Lungs CTAB and moving good air Gastrointestinal: Soft nontender Musculoskeletal: No lower extremity edema   Neurologic: Moves all 4 extremities Skin:  Skin is warm mild diaphoresis Psychiatric: Agitated delirium    ____________________________________________   DIFFERENTIAL includes but not limited to  Agitated delirium, cocaine overdose, methamphetamine overdose, rhabdomyolysis, dehydration, encephalitis, meningitis ____________________________________________   LABS (all labs ordered are listed, but only abnormal results are displayed)  Labs Reviewed  BASIC METABOLIC PANEL - Abnormal; Notable for the following:       Result Value   Sodium 133 (*)    Potassium 3.0 (*)    Chloride 99 (*)    CO2 19 (*)    BUN 37 (*)    Creatinine, Ser 2.74 (*)    Calcium 8.8 (*)    GFR calc non Af Amer 10 (*)    GFR calc Af Amer 11 (*)    All other components within normal limits  HEPATIC FUNCTION PANEL - Abnormal; Notable for the following:    AST 79 (*)    Bilirubin, Direct <0.1 (*)    All other components within normal limits  LACTIC ACID, PLASMA - Abnormal;  Notable for the following:    Lactic Acid, Venous 4.2 (*)    All other components within normal limits  CBC WITH DIFFERENTIAL/PLATELET - Abnormal; Notable for the following:    RBC 2.90 (*)    Hemoglobin 9.3 (*)    HCT 25.2 (*)    MCHC 37.0 (*)    All other components within normal limits  CK - Abnormal; Notable for the following:    Total CK 1,816 (*)    All other components within normal limits  ACETAMINOPHEN LEVEL - Abnormal; Notable for the following:    Acetaminophen (Tylenol), Serum <10 (*)    All other components within normal limits  CULTURE, BLOOD (ROUTINE X 2)  CULTURE, BLOOD (ROUTINE X 2)  ETHANOL  SALICYLATE LEVEL  PROTIME-INR  TSH  URINE DRUG SCREEN,  QUALITATIVE ([facility] ONLY)  URINALYSIS, COMPLETE (UACMP) WITH MICROSCOPIC  PREGNANCY, URINE  BLOOD GAS, ARTERIAL    Blood work reviewed and interpreted by me shows elevated lactic acid which could be secondary to dehydration or sepsis Creatinine of 2.74 and elevated CK concerning for rhabdomyolysis. __________________________________________  EKG  ED ECG REPORT I, Darel Hong, the attending physician, personally viewed and interpreted this ECG.  Date: 09/15/2017 EKG Time:  Rate: 73 Rhythm: normal sinus rhythm QRS Axis: normal Intervals: Long QTC ST/T Wave abnormalities: normal Narrative Interpretation: no evidence of acute ischemia  ____________________________________________  RADIOLOGY  Chest x-ray reviewed by me confirms ET tube in adequate position ____________________________________________   PROCEDURES  Procedure(s) performed: yes  INTUBATION Performed by: Darel Hong  Required items: required blood products, implants, devices, and special equipment available Patient identity confirmed: provided demographic data and hospital-assigned identification number Time out: Immediately prior to procedure a "time out" was called to verify the correct patient, procedure, equipment, support staff and site/side marked as required.  Indications: Agitated delirium not responding to intramuscular medications  Intubation method: MAC 4  Preoxygenation: high flow nasal canula  Sedatives: Etomidate Paralytic: Rocuronium  Tube Size: 7.5 cuffed  Post-procedure assessment: chest rise and ETCO2 monitor Breath sounds: equal and absent over the epigastrium Tube secured with: ETT holder Chest x-ray interpreted by radiologist and me.  Chest x-ray findings: endotracheal tube in appropriate position  Patient tolerated the procedure well with no immediate complications.    INTRAOSSEOUS LINE PLACEMENT No pink intraosseous line placed in her right proximal tibia in usual  sterile fashion.  Marrow withdrew and flushed easily   CENTRAL LINE Performed by: Darel Hong Consent: The procedure was performed in an emergent situation. Required items: required blood products, implants, devices, and special equipment available Patient identity confirmed: arm band and provided demographic data Time out: Immediately prior to procedure a "time out" was called to verify the correct patient, procedure, equipment, support staff and site/side marked as required. Indications: vascular access Anesthesia: l intubated and sedated Patient sedated: Yes Preparation: skin prepped with 2% chlorhexidine Skin prep agent dried: skin prep agent completely dried prior to procedure Sterile barriers: all five maximum sterile barriers used - cap, mask, sterile gown, sterile gloves, and large sterile sheet Hand hygiene: hand hygiene performed prior to central venous catheter insertion  Location details: Right femoral  Catheter type: triple lumen Catheter size: 8 Fr Pre-procedure: landmarks identified Ultrasound guidance: Not required Successful placement: yes Post-procedure: line sutured and dressing applied Assessment: blood return through all parts, free fluid flow, placement verified by x-ray and no pneumothorax on x-ray Patient tolerance: Patient tolerated the procedure well with no immediate complications.   Procedures  Critical Care performed: yes  CRITICAL CARE Performed by: Darel Hong   Total critical care time: 45 minutes  Critical care time was exclusive of separately billable procedures and treating other patients.  Critical care was necessary to treat or prevent imminent or life-threatening deterioration.  Critical care was time spent personally by me on the following activities: development of treatment plan with patient and/or surrogate as well as nursing, discussions with consultants, evaluation of patient's response to treatment, examination of patient,  obtaining history from patient or surrogate, ordering and performing treatments and interventions, ordering and review of laboratory studies, ordering and review of radiographic studies, pulse oximetry and re-evaluation of patient's condition.   Observation: no ____________________________________________   INITIAL IMPRESSION / ASSESSMENT AND PLAN / ED COURSE  Pertinent labs & imaging results that were available during my care of the patient were reviewed by me and considered in my medical decision making (see chart for details).  The patient arrived in extremis.  Her friend said that apparently she had been using unknown drugs today and was acting in a bizarre fashion.  She asked him to administer Narcan and after he gave her 4 mg intranasally her behavior worsened.  Further history was limited as the patient was wild, agitated, and could not provide history.  She was initially given 5 mg of haloperidol and 2 mg of lorazepam intramuscularly which had no effect.  She was spitting and biting bleeding from the mouth so I then gave her 300 mg of ketamine intramuscularly for her agitated delirium.  10 minutes after the ketamine the patient still was agitated and uncooperative fighting spitting.  Decision was then made to intubate for the patient's own safety.  I placed a right tibial intraosseous line and intubated her with etomidate and rocuronium.     The patient has acute kidney injury and elevated CK concerning for rhabdomyolysis.  We will continue aggressive fluid resuscitation.  Her elevated lactate may very well be secondary to dehydration but given the unclear history a dose of broad-spectrum antibiotics has been added.  She remained stable for ICU admission ____________________________________________   FINAL CLINICAL IMPRESSION(S) / ED DIAGNOSES  Final diagnoses:  Drug overdose, undetermined intent, initial encounter  Delirium      NEW MEDICATIONS STARTED DURING THIS VISIT:  New  Prescriptions   No medications on file     Note:  This document was prepared using Dragon voice recognition software and may include unintentional dictation errors.     Darel Hong, MD 09/15/17 [id number]    Darel Hong, MD 09/15/17 [phone]

## 2017-09-15 NOTE — ED Notes (Signed)
Contraband (white powder in clear saran wrap) give to security.

## 2017-09-15 NOTE — Consult Note (Signed)
PULMONARY / CRITICAL CARE MEDICINE   Name: [name] MRN: [id number] DOB: April 29, 1981    ADMISSION DATE:  09/15/2017  CONSULTATION DATE:  09/15/17  REFERRING MD:  Dr. Sheryle Hail  CHIEF COMPLAINT:  Drug overdose  HISTORY OF PRESENT ILLNESS:    [name] is a 36 year old female with History of drug abuse was brought to ED with AMS.  Patient was using drugs and went to her friends house requesting  her to give her narcan. Narcan made her worse.  In the ED she was not able to protect her airway and therefore was intubated.  UDS was positive for amphetamine,Cocaine and positive.  PAST MEDICAL HISTORY :  She  has no past medical history on file.  PAST SURGICAL HISTORY: She  has no past surgical history on file.  Allergies no known allergies  No current facility-administered medications on file prior to encounter.    No current outpatient prescriptions on file prior to encounter.    FAMILY HISTORY:  Her has no family status information on file.    SOCIAL HISTORY: No social history  REVIEW OF SYSTEMS:   Unable to obtain  SUBJECTIVE:  Unable to obtain  VITAL SIGNS: BP 129/79   Pulse 75   Temp (!) 96.9 F (36.1 C)   Wt 99 lb 3.3 oz (45 kg)   SpO2 96%   HEMODYNAMICS:    VENTILATOR SETTINGS: Vent Mode: AC FiO2 (%):  [30 %-100 %] 30 % Set Rate:  [12 bmp-14 bmp] 12 bmp Vt Set:  [450 mL-500 mL] 450 mL PEEP:  [5 cmH20] 5 cmH20  INTAKE / OUTPUT: No intake/output data recorded.  PHYSICAL EXAMINATION: General:  36 year old female intubated and on mechanical ventilation Neuro:  Sedated,responds to painful stimuli HEENT: AT,[place],No jvd Cardiovascular:  S1s2,regular,no m/r/g Lungs: Clear bilaterally, no wheezes,crackles and rhonchi Abdomen:  Soft,NT,ND Musculoskeletal: No edema,cyanosis Skin:  Warm,dry and intact  LABS:  BMET  Recent Labs Lab 09/15/17 0209  NA 133*  K 3.0*  CL 99*  CO2 19*  BUN 37*  CREATININE 2.74*  GLUCOSE 83     Electrolytes  Recent Labs Lab 09/15/17 0209  CALCIUM 8.8*    CBC  Recent Labs Lab 09/15/17 0209  WBC 7.6  HGB 9.3*  HCT 25.2*  PLT 326    Coag's  Recent Labs Lab 09/15/17 0209  INR 1.13    Sepsis Markers  Recent Labs Lab 09/15/17 0209  LATICACIDVEN 4.2*    ABG  Recent Labs Lab 09/15/17 0209  PHART 7.56*  PCO2ART 24*  PO2ART 527*    Liver Enzymes  Recent Labs Lab 09/15/17 0209  AST 79*  ALT 34  ALKPHOS 103  BILITOT 0.9  ALBUMIN 3.6    Cardiac Enzymes No results for input(s): TROPONINI, PROBNP in the last 168 hours.  Glucose No results for input(s): GLUCAP in the last 168 hours.  Imaging No results found.   STUDIES:  09/15/17 UDS>> Positive for amphetamine,cocaine,cannabinoid and opiate  CULTURES: 09/15/17 Blood Culture>>  ANTIBIOTICS: none  SIGNIFICANT EVENTS: 09/15/17  Patient admitted to the ICU with drug overdose,intubated for airway protection  LINES/TUBES: 09/15/17 ET tube>>  ASSESSMENT / PLAN:  PULMONARY A: Intubated for airway protection P:   Vent settings reviewed VAP bundle implemented SBT trial when tolerated  CARDIOVASCULAR A:  No active issues P:  Continuous telemetry Normotensive   RENAL A:   Acute Kidney Injury Hyponatremia Hypokalemia Rhabdomylosis P:   Replace electrolytes Follow CK  GASTROINTESTINAL A:   Elevated  AST due to drug abuse P:   Follow AST intermittently  HEMATOLOGIC A:   No active issues P:  Heparin for DVT prophylaxis Transfuse if Hgb<7  INFECTIOUS A:   Lactic acidosis P:   Trend lactic acid  ENDOCRINE A:   No active issues P:   Blood glucose checks intermittently with BMP  NEUROLOGIC A:   ET TUBE associated discomfort Severe Agitation P:   RASS goal: 0 t0 -1 Continue Precedex    FAMILY  - Updates: No family at the bedside     Bincy Varughese,AG-ACNP Pulmonary and Critical Care Medicine [facility]   09/15/2017, 5:36  AM   PCCM ATTENDING ATTESTATION:  I have evaluated patient with the APP Varughese, reviewed database in its entirety and discussed care plan in detail.   Passed SBT this morning and extubated.  Somnolent despite several hours off of propofol and dexmedetomidine.  However, she did respond to naloxone.  Was old appearing contusion the right side of her forehead mild periorbital bruising Neck is supple without JVD Chest is clear anteriorly Cardiac exam reveals regular rhythm, normal rate Abdomen reveals mild guarding, NABS Extremities warm, no edema track marks noted Moves all extremities equally  CXR: No acute cardiac or pulmonary findings   Major problems addressed by PCCM team: Polysubstance overdose Altered mental status due to recreational drugs Severe opiate addiction CKD - baseline Cr unknown  PLAN/REC: Extubated this a.m. under my supervision Monitor in ICU post extubation Supplemental oxygen as needed IVF adjusted Monitor cognitive status Avoid all sedating medications I will recheck her cognitive status later in the day and if not significantly improved, will consider CT head I updated her mother and stepsister at the bedside.  They are very frustrated as this has been a repetitive problem.  Per their report, he has had 3 hospitalizations similar to this one since September  CCM 35 mins  Billy Fischeravid Trevor Duty, MD PCCM service Mobile [phone] Pager [phone] 09/15/2017 2:03 PM

## 2017-09-15 NOTE — Progress Notes (Signed)
Vt decreased Fio2 decreased 40% per abg results. Dr. Lavera Guise notified.

## 2017-09-15 NOTE — Progress Notes (Signed)
R osteo IV removed at 1123. There is no placement documentation under IV assessment.

## 2017-09-15 NOTE — ED Notes (Signed)
5 milligrams bolus from bottle administered by Rosanne SackKasey, RN per VORB MD

## 2017-09-15 NOTE — ED Notes (Signed)
Date and time results received: 09/15/17 2:48 AM  Test: Lactic Critical Value: 4.2  Name of Provider Notified: Dr. Lamont Snowballifenbark  Orders Received? Or Actions Taken?: Acknowledged.

## 2017-09-15 NOTE — H&P (Signed)
[name] is an 36 y.o. female.   Chief Complaint: Drug overdose HPI: Patient with past medical history of drug abuse presents to the emergency department with drug overdose.  The patient's friend states that he went to check on her and found her to be agitated.  She asked for Narcan which she provided intranasally after which the patient began to convulse and become very agitated.  In the emergency department she quickly became unresponsive.  She continued to have convulsions but was not unconscious and would try to attend to verbal commands.  She was given ketamine as well as Haldol and Ativan without relief which prompted rapid sequence intubation with subsequent deep sedation.  No past medical history on file. Patient is intubated and sedated  No past surgical history on file. Patient is intubated and sedated  No family history on file. Patient is intubated and sedated Social History:  has no tobacco, alcohol, and drug history on file.  Allergies: Allergies no known allergies  Prior to Admission medications   Not on File     Results for orders placed or performed during the hospital encounter of 09/15/17 (from the past 48 hour(s))  Basic metabolic panel     Status: Abnormal   Collection Time: 09/15/17  2:09 AM  Result Value Ref Range   Sodium 133 (L) 135 - 145 mmol/L   Potassium 3.0 (L) 3.5 - 5.1 mmol/L   Chloride 99 (L) 101 - 111 mmol/L   CO2 19 (L) 22 - 32 mmol/L   Glucose, Bld 83 65 - 99 mg/dL   BUN 37 (H) 6 - 20 mg/dL   Creatinine, Ser 2.74 (H) 0.44 - 1.00 mg/dL   Calcium 8.8 (L) 8.9 - 10.3 mg/dL   GFR calc non Af Amer 10 (L) >60 mL/min   GFR calc Af Amer 11 (L) >60 mL/min    Comment: (NOTE) The eGFR has been calculated using the CKD EPI equation. This calculation has not been validated in all clinical situations. eGFR's persistently <60 mL/min signify possible Chronic Kidney Disease.    Anion gap 15 5 - 15  Ethanol     Status: None   Collection Time: 09/15/17   2:09 AM  Result Value Ref Range   Alcohol, Ethyl (B) <10 <10 mg/dL    Comment:        LOWEST DETECTABLE LIMIT FOR SERUM ALCOHOL IS 10 mg/dL FOR MEDICAL PURPOSES ONLY   Hepatic function panel     Status: Abnormal   Collection Time: 09/15/17  2:09 AM  Result Value Ref Range   Total Protein 7.5 6.5 - 8.1 g/dL   Albumin 3.6 3.5 - 5.0 g/dL   AST 79 (H) 15 - 41 U/L   ALT 34 14 - 54 U/L   Alkaline Phosphatase 103 38 - 126 U/L   Total Bilirubin 0.9 0.3 - 1.2 mg/dL   Bilirubin, Direct <0.1 (L) 0.1 - 0.5 mg/dL   Indirect Bilirubin NOT CALCULATED 0.3 - 0.9 mg/dL  Salicylate level     Status: None   Collection Time: 09/15/17  2:09 AM  Result Value Ref Range   Salicylate Lvl <9.5 2.8 - 30.0 mg/dL  Lactic acid, plasma     Status: Abnormal   Collection Time: 09/15/17  2:09 AM  Result Value Ref Range   Lactic Acid, Venous 4.2 (HH) 0.5 - 1.9 mmol/L    Comment: CRITICAL RESULT CALLED TO, READ BACK BY AND VERIFIED WITH: IRIS GUIDRY AT 6387 09/15/17.PMH   CBC with Differential  Status: Abnormal   Collection Time: 09/15/17  2:09 AM  Result Value Ref Range   WBC 7.6 3.6 - 11.0 K/uL   RBC 2.90 (L) 3.80 - 5.20 MIL/uL   Hemoglobin 9.3 (L) 12.0 - 16.0 g/dL   HCT 25.2 (L) 35.0 - 47.0 %   MCV 87.1 80.0 - 100.0 fL   MCH 32.2 26.0 - 34.0 pg   MCHC 37.0 (H) 32.0 - 36.0 g/dL   RDW 13.6 11.5 - 14.5 %   Platelets 326 150 - 440 K/uL   Neutrophils Relative % 77 %   Neutro Abs 5.8 1.4 - 6.5 K/uL   Lymphocytes Relative 16 %   Lymphs Abs 1.2 1.0 - 3.6 K/uL   Monocytes Relative 5 %   Monocytes Absolute 0.4 0.2 - 0.9 K/uL   Eosinophils Relative 1 %   Eosinophils Absolute 0.1 0 - 0.7 K/uL   Basophils Relative 1 %   Basophils Absolute 0.1 0 - 0.1 K/uL  Protime-INR     Status: None   Collection Time: 09/15/17  2:09 AM  Result Value Ref Range   Prothrombin Time 14.4 11.4 - 15.2 seconds   INR 1.13   Urine Drug Screen, Qualitative     Status: Abnormal   Collection Time: 09/15/17  2:09 AM  Result Value  Ref Range   Tricyclic, Ur Screen NONE DETECTED NONE DETECTED   Amphetamines, Ur Screen POSITIVE (A) NONE DETECTED   MDMA (Ecstasy)Ur Screen NONE DETECTED NONE DETECTED   Cocaine Metabolite,Ur [place] POSITIVE (A) NONE DETECTED   Opiate, Ur Screen POSITIVE (A) NONE DETECTED   Phencyclidine (PCP) Ur S NONE DETECTED NONE DETECTED   Cannabinoid 50 Ng, Ur [place] POSITIVE (A) NONE DETECTED   Barbiturates, Ur Screen NONE DETECTED NONE DETECTED   Benzodiazepine, Ur Scrn NONE DETECTED NONE DETECTED   Methadone Scn, Ur NONE DETECTED NONE DETECTED    Comment: (NOTE) 659  Tricyclics, urine               Cutoff 1000 ng/mL 200  Amphetamines, urine             Cutoff 1000 ng/mL 300  MDMA (Ecstasy), urine           Cutoff 500 ng/mL 400  Cocaine Metabolite, urine       Cutoff 300 ng/mL 500  Opiate, urine                   Cutoff 300 ng/mL 600  Phencyclidine (PCP), urine      Cutoff 25 ng/mL 700  Cannabinoid, urine              Cutoff 50 ng/mL 800  Barbiturates, urine             Cutoff 200 ng/mL 900  Benzodiazepine, urine           Cutoff 200 ng/mL 1000 Methadone, urine                Cutoff 300 ng/mL 1100 1200 The urine drug screen provides only a preliminary, unconfirmed 1300 analytical test result and should not be used for non-medical 1400 purposes. Clinical consideration and professional judgment should 1500 be applied to any positive drug screen result due to possible 1600 interfering substances. A more specific alternate chemical method 1700 must be used in order to obtain a confirmed analytical result.  1800 Gas chromato graphy / mass spectrometry (GC/MS) is the preferred 1900 confirmatory method.   Urinalysis, Complete w Microscopic  Status: Abnormal   Collection Time: 09/15/17  2:09 AM  Result Value Ref Range   Color, Urine YELLOW (A) YELLOW   APPearance HAZY (A) CLEAR   Specific Gravity, Urine 1.014 1.005 - 1.030   pH 6.0 5.0 - 8.0   Glucose, UA NEGATIVE NEGATIVE mg/dL   Hgb urine  dipstick MODERATE (A) NEGATIVE   Bilirubin Urine NEGATIVE NEGATIVE   Ketones, ur NEGATIVE NEGATIVE mg/dL   Protein, ur 100 (A) NEGATIVE mg/dL   Nitrite NEGATIVE NEGATIVE   Leukocytes, UA NEGATIVE NEGATIVE   RBC / HPF 6-30 0 - 5 RBC/hpf   WBC, UA 6-30 0 - 5 WBC/hpf   Bacteria, UA RARE (A) NONE SEEN   Squamous Epithelial / LPF NONE SEEN NONE SEEN   Mucus PRESENT    Hyaline Casts, UA PRESENT   CK     Status: Abnormal   Collection Time: 09/15/17  2:09 AM  Result Value Ref Range   Total CK 1,816 (H) 38 - 234 U/L  TSH     Status: None   Collection Time: 09/15/17  2:09 AM  Result Value Ref Range   TSH 0.521 0.350 - 4.500 uIU/mL    Comment: Performed by a 3rd Generation assay with a functional sensitivity of <=0.01 uIU/mL.  Pregnancy, urine     Status: None   Collection Time: 09/15/17  2:09 AM  Result Value Ref Range   Preg Test, Ur NEGATIVE NEGATIVE  Acetaminophen level     Status: Abnormal   Collection Time: 09/15/17  2:09 AM  Result Value Ref Range   Acetaminophen (Tylenol), Serum <10 (L) 10 - 30 ug/mL    Comment:        THERAPEUTIC CONCENTRATIONS VARY SIGNIFICANTLY. A RANGE OF 10-30 ug/mL MAY BE AN EFFECTIVE CONCENTRATION FOR MANY PATIENTS. HOWEVER, SOME ARE BEST TREATED AT CONCENTRATIONS OUTSIDE THIS RANGE. ACETAMINOPHEN CONCENTRATIONS >150 ug/mL AT 4 HOURS AFTER INGESTION AND >50 ug/mL AT 12 HOURS AFTER INGESTION ARE OFTEN ASSOCIATED WITH TOXIC REACTIONS.   Blood gas, arterial     Status: Abnormal   Collection Time: 09/15/17  2:09 AM  Result Value Ref Range   FIO2 1.00    Delivery systems VENTILATOR    Mode ASSIST CONTROL    VT 500 mL   Peep/cpap 5.0 cm H20   pH, Arterial 7.56 (H) 7.350 - 7.450   pCO2 arterial 24 (L) 32.0 - 48.0 mmHg   pO2, Arterial 527 (H) 83.0 - 108.0 mmHg   Bicarbonate 21.5 20.0 - 28.0 mmol/L   Acid-Base Excess 0.1 0.0 - 2.0 mmol/L   O2 Saturation 100.0 %   Patient temperature 37.0    Collection site RIGHT RADIAL    Sample type ARTERIAL  DRAW    Allens test (pass/fail) PASS PASS   Mechanical Rate 14    No results found.  Review of Systems  Unable to perform ROS: Intubated    Blood pressure 128/74, pulse 72, temperature (!) 96.6 F (35.9 C), resp. rate (!) 38, weight 45 kg (99 lb 3.3 oz), SpO2 96 %. Physical Exam  Vitals reviewed. Constitutional: She is oriented to person, place, and time. She appears well-developed and well-nourished.  HENT:  Head: Normocephalic and atraumatic.  Eyes: Pupils are equal, round, and reactive to light. EOM are normal.  Neck: Normal range of motion. No tracheal deviation present. No thyromegaly present.  Cardiovascular: Normal rate, regular rhythm and normal heart sounds.  Exam reveals no gallop and no friction rub.   No murmur heard. Respiratory: Effort  normal and breath sounds normal.  GI: Soft. Bowel sounds are normal. She exhibits no distension. There is no tenderness.  Lymphadenopathy:    She has no cervical adenopathy.  Neurological: She is alert and oriented to person, place, and time. No cranial nerve deficit. She exhibits normal muscle tone.  Skin: Skin is warm and dry.  Psychiatric: She has a normal mood and affect. Judgment and thought content normal.     Assessment/Plan This is a 36 year old female admitted for acute encephalopathy secondary to drug overdose. 1.  Encephalopathy: acute; secondary to opiate reversal and cocaine overdose.  The patient is now intubated and sedated. 2.  Acute kidney injury: Hydrate with intravenous fluid.  Avoid nephrotoxic agents 3.  Rhabdomyolysis: Secondary to fasciculations due to drug overdose.  Continue to monitor total CK as well as electrolytes.  Monitor renal function.  Hydrate with intravenous fluids 4.  IV drug abuse: Patient has track marks all over her body but does not currently have signs or symptoms of sepsis.  She received 1 dose of IV vancomycin and Zosyn in the emergency department.  Monitor for signs or symptoms of  septicemia. 5.  DVT prophylaxis: Heparin 6.  GI prophylaxis: None The patient is a full code.  Time spent on admission orders and critical care proximally 45 minutes.  Discussed with [facility] telemedicine.  Harrie Foreman, MD 09/15/2017, 5:56 AM

## 2017-09-15 NOTE — Progress Notes (Signed)
Patient arrived from ED.  Patient is vented. Agitated.  On Propofol and Precedex.  Friend is at bedside.

## 2017-09-15 NOTE — ED Triage Notes (Signed)
Pt brought in by POV by friend "Ronaldo MiyamotoKyle".  Pt took unknown drugs at home and has hx of drug use.  Upon arrival to ED, pt is fighting staff and has erratic behavior.  Per "Ronaldo MiyamotoKyle" pt asked to be given narcan and he states that it only made her slightly worse than she was before.  Unknown what patient took, but pt has previous hx of multiple drug use.  EDP at bedside.

## 2017-09-15 NOTE — Progress Notes (Signed)
Patients sats dropped after suctioning, increased to 40% and sats came up to 91%

## 2017-09-15 NOTE — Progress Notes (Signed)
Extubated at 1025 to [facility]. Pt still somnolent. Friend at bedside.

## 2017-09-16 ENCOUNTER — Encounter: Payer: Self-pay | Admitting: *Deleted

## 2017-09-16 ENCOUNTER — Other Ambulatory Visit: Payer: Self-pay

## 2017-09-16 DIAGNOSIS — T50904D Poisoning by unspecified drugs, medicaments and biological substances, undetermined, subsequent encounter: Secondary | ICD-10-CM

## 2017-09-16 DIAGNOSIS — N179 Acute kidney failure, unspecified: Secondary | ICD-10-CM

## 2017-09-16 DIAGNOSIS — E876 Hypokalemia: Secondary | ICD-10-CM

## 2017-09-16 LAB — BASIC METABOLIC PANEL
Anion gap: 11 (ref 5–15)
BUN: 25 mg/dL — ABNORMAL HIGH (ref 6–20)
CALCIUM: 8.6 mg/dL — AB (ref 8.9–10.3)
CHLORIDE: 106 mmol/L (ref 101–111)
CO2: 21 mmol/L — AB (ref 22–32)
Creatinine, Ser: 1.89 mg/dL — ABNORMAL HIGH (ref 0.44–1.00)
GFR calc non Af Amer: 34 mL/min — ABNORMAL LOW (ref >60)
GFR, EST AFRICAN AMERICAN: 39 mL/min — AB (ref >60)
Glucose, Bld: 87 mg/dL (ref 65–99)
POTASSIUM: 3.4 mmol/L — AB (ref 3.5–5.1)
Sodium: 138 mmol/L (ref 135–145)

## 2017-09-16 LAB — CK: Total CK: 436 U/L — ABNORMAL HIGH (ref 38–234)

## 2017-09-16 MED ORDER — ENOXAPARIN SODIUM 40 MG/0.4ML ~~LOC~~ SOLN
40.0000 mg | SUBCUTANEOUS | Status: DC
  Administered 2017-09-16: 40 mg via SUBCUTANEOUS
  Filled 2017-09-16: qty 0.4

## 2017-09-16 MED ORDER — HYDRALAZINE HCL 20 MG/ML IJ SOLN
10.0000 mg | INTRAMUSCULAR | Status: DC | PRN
  Administered 2017-09-16 – 2017-09-17 (×2): 10 mg via INTRAVENOUS
  Filled 2017-09-16: qty 1

## 2017-09-16 MED ORDER — INFLUENZA VAC SPLIT QUAD 0.5 ML IM SUSY
0.5000 mL | PREFILLED_SYRINGE | INTRAMUSCULAR | Status: AC
  Administered 2017-09-17: 0.5 mL via INTRAMUSCULAR
  Filled 2017-09-16: qty 0.5

## 2017-09-16 MED ORDER — PNEUMOCOCCAL VAC POLYVALENT 25 MCG/0.5ML IJ INJ
0.5000 mL | INJECTION | INTRAMUSCULAR | Status: AC
  Administered 2017-09-17: 0.5 mL via INTRAMUSCULAR
  Filled 2017-09-16: qty 0.5

## 2017-09-16 MED ORDER — LORAZEPAM 0.5 MG PO TABS
0.5000 mg | ORAL_TABLET | Freq: Four times a day (QID) | ORAL | Status: DC | PRN
  Administered 2017-09-16 – 2017-09-17 (×2): 0.5 mg via ORAL
  Filled 2017-09-16 (×2): qty 1

## 2017-09-16 MED ORDER — HYDRALAZINE HCL 20 MG/ML IJ SOLN
INTRAMUSCULAR | Status: AC
  Administered 2017-09-16: 10 mg via INTRAVENOUS
  Filled 2017-09-16: qty 1

## 2017-09-16 NOTE — Progress Notes (Signed)
Patient asleep most of shift. Alert. Foley catheter removed without difficulty. Hydralazine given x 1 for HTN.  Will monitor.

## 2017-09-16 NOTE — Progress Notes (Signed)
[employer] [facility] - [place] at [facility]   PATIENT NAME: [name]    MR#:  [id number]  DATE OF BIRTH:  [birth year]  SUBJECTIVE:  CHIEF COMPLAINT:   Chief Complaint  Patient presents with  . Drug Overdose   -Patient extubated, off Precedex drip this morning. More alert. -Acknowledges depression and need for help   REVIEW OF SYSTEMS:  Review of Systems  Unable to perform ROS: Mental status change    DRUG ALLERGIES:   Allergies  Allergen Reactions  . Latex   . Vancomycin   . Amoxicillin Rash  . Clindamycin/Lincomycin Rash    VITALS:  Blood pressure (!) 148/95, pulse 86, temperature 98.9 F (37.2 C), temperature source Oral, resp. rate (!) 27, height 5\' 6"  (1.676 m), weight 55.1 kg (121 lb 7.6 oz), SpO2 100 %.  PHYSICAL EXAMINATION:  Physical Exam  GENERAL:  36 y.o.-year-old patient lying in the bed with no acute distress. Sedated. EYES: Pupils equal, round, reactive to light and accommodation. No scleral icterus. Extraocular muscles intact.  HEENT: Head atraumatic, normocephalic. Oropharynx and nasopharynx clear. But dry mucus membranes NECK:  Supple, no jugular venous distention. No thyroid enlargement, no tenderness.  LUNGS: Normal breath sounds bilaterally, no wheezing, rales,rhonchi or crepitation. No use of accessory muscles of respiration.  CARDIOVASCULAR: S1, S2 normal. No murmurs, rubs, or gallops.  ABDOMEN: Soft, nontender, nondistended. Bowel sounds present. No organomegaly or mass.  EXTREMITIES: No pedal edema, cyanosis, or clubbing.  NEUROLOGIC: Drifting back to sleep easily, however able to follow commands, oriented. -No focal motor or sensory deficits. PSYCHIATRIC: The patient is lethargic, but when aroused-cognitively intact and oriented 3.  SKIN: No obvious rash, lesion, or ulcer.    LABORATORY PANEL:   CBC Recent Labs  Lab 09/15/17 0209  WBC 7.6  HGB 9.3*  HCT 25.2*  PLT 326    ------------------------------------------------------------------------------------------------------------------  Chemistries  Recent Labs  Lab 09/15/17 0209  09/16/17 0546  NA 133*   < > 138  K 3.0*   < > 3.4*  CL 99*   < > 106  CO2 19*   < > 21*  GLUCOSE 83   < > 87  BUN 37*   < > 25*  CREATININE 2.74*   < > 1.89*  CALCIUM 8.8*   < > 8.6*  AST 79*  --   --   ALT 34  --   --   ALKPHOS 103  --   --   BILITOT 0.9  --   --    < > = values in this interval not displayed.   ------------------------------------------------------------------------------------------------------------------  Cardiac Enzymes No results for input(s): TROPONINI in the last 168 hours. ------------------------------------------------------------------------------------------------------------------  RADIOLOGY:  No results found.  EKG:   Orders placed or performed during the hospital encounter of 09/15/17  . ED EKG  . ED EKG  . EKG 12-Lead  . EKG 12-Lead    ASSESSMENT AND PLAN:   36 year old female with past medical history significant for cocaine abuse and polysubstance abuse and recent admission for drug overdose presents again to the hospital with cocaine overdose.  1.  Acute respiratory failure-intubated for airway protection, currently extubated and on room air today -Follow chest x-rays to rule out any aspiration  2.  Polysubstance abuse with overdose-recent admission, transferred to behavioral medicine unit -Will need psych consult again after transfer to the floor  3.  Rhabdomyolysis and acute renal failure-continue IV fluids and monitor creatinine. -slow improvement noted -If no improvement, consider renal  ultrasound and nephrology consult.  4.  Hypokalemia-replaced  5.  DVT prophylaxis-subcutaneous heparin.  6.  Metabolic encephalopathy-secondary to drug overdose.  Currently off Precedex . Monitor  Likely will be transferred to floor today   All the records are  reviewed and case discussed with Care Management/Social Workerr. Management plans discussed with the patient, family and they are in agreement.  CODE STATUS: Full Code  TOTAL TIME TAKING CARE OF THIS PATIENT: 36 minutes.   POSSIBLE D/C IN 1-2 DAYS, DEPENDING ON CLINICAL CONDITION.   Enid BaasKALISETTI,Daveigh Batty M.D on 09/16/2017 at 12:11 PM  Between 7am to 6pm - Pager - [phone]  After 6pm go to [email or web address] - password [email or web address]  [facility]  Office  [phone]  CC: Primary care physician; Patient, No Pcp Per

## 2017-09-16 NOTE — Progress Notes (Signed)
Peripheral IV insertion attempted by this RN and Jamesetta GeraldsKim Smythe, RN and unsuccessful due to poor vasculature.  Central line remains in place.

## 2017-09-16 NOTE — Progress Notes (Addendum)
Mildly lethargic. Cognition intact. No distress. No complaints  Vitals:   09/16/17 0800 09/16/17 0900 09/16/17 1000 09/16/17 1145  BP: (!) 170/106 (!) 157/93 (!) 148/85   Pulse: 97 92 92   Resp: (!) 21 (!) 29 (!) 25   Temp: 98.9 F (37.2 C)   98.9 F (37.2 C)  TempSrc: Oral   Oral  SpO2: 100% 100% 100%   Weight:      Height:         Gen: NAD HEENT: NCAT, sclerae white Neck: No JVD noted Lungs: full BS, no adventitious sounds Cardiovascular: Reg, no M noted Abdomen: Soft, NT, +BS Ext: no C/C/E Neuro: grossly intact  BMP Latest Ref Rng & Units 09/16/2017 09/15/2017 09/15/2017  Glucose 65 - 99 mg/dL 87 96(E64(L) 83  BUN 6 - 20 mg/dL 95(M25(H) 84(X30(H) 32(G37(H)  Creatinine 0.44 - 1.00 mg/dL 4.01(U1.89(H) 2.72(Z2.21(H) 3.66(Y2.74(H)  Sodium 135 - 145 mmol/L 138 136 133(L)  Potassium 3.5 - 5.1 mmol/L 3.4(L) 3.4(L) 3.0(L)  Chloride 101 - 111 mmol/L 106 105 99(L)  CO2 22 - 32 mmol/L 21(L) 19(L) 19(L)  Calcium 8.9 - 10.3 mg/dL 4.0(H8.6(L) 4.7(Q8.6(L) 2.5(Z8.8(L)    CBC Latest Ref Rng & Units 09/15/2017  WBC 3.6 - 11.0 K/uL 7.6  Hemoglobin 12.0 - 16.0 g/dL 5.6(L9.3(L)  Hematocrit 87.535.0 - 47.0 % 25.2(L)  Platelets 150 - 440 K/uL 326      IMPRESSION: Polysubstance abuse Polysubstance OD Acute respiratory failure - was intubated for AMS  Successfully extubated 11/03 AKI - nonoliguric. Cr improving Hypokalemia Poor venous access  PLAN/REC: Transfer to med-surg Advance activity and diet Correct electrolytes Repeat BMET in AM 11/05 Need to investigate options for substance abuse rehab  PIV ordered but attempt was unsuccessful - Will leave femoral CVL in place for now. Please make sure this is removed as soon as feasible   After transfer, PCCM will sign off. Please call if we can be of further assistance    Billy Fischeravid Garett Tetzloff, MD PCCM service Mobile [phone] Pager [phone] 09/16/2017 12:07 PM

## 2017-09-16 NOTE — Progress Notes (Signed)
Report called to Amanda, RN on 1A.   

## 2017-09-16 NOTE — Plan of Care (Signed)
Patient alert and oriented. Able to make needs known.  No very talkative. Slept most of shift.  Will monitor.

## 2017-09-16 NOTE — Progress Notes (Signed)
Patient asleep most of shift.  Alert alert and responsive.  Able to make needs known.  Hydralazine given for elevated BP with + effect.  Urine output has decreased.  Bladder scan reveals 163 ml. Will monitor.

## 2017-09-16 NOTE — Progress Notes (Signed)
Patient moved to room 155 by wheelchair with this RN.  Alert with no distress noted.

## 2017-09-17 LAB — BASIC METABOLIC PANEL
Anion gap: 8 (ref 5–15)
BUN: 13 mg/dL (ref 6–20)
CHLORIDE: 102 mmol/L (ref 101–111)
CO2: 25 mmol/L (ref 22–32)
CREATININE: 1.27 mg/dL — AB (ref 0.44–1.00)
Calcium: 8.4 mg/dL — ABNORMAL LOW (ref 8.9–10.3)
GFR calc Af Amer: >60 mL/min (ref >60)
GFR calc non Af Amer: 54 mL/min — ABNORMAL LOW (ref >60)
GLUCOSE: 132 mg/dL — AB (ref 65–99)
POTASSIUM: 3.2 mmol/L — AB (ref 3.5–5.1)
Sodium: 135 mmol/L (ref 135–145)

## 2017-09-17 LAB — MAGNESIUM: Magnesium: 1.6 mg/dL — ABNORMAL LOW (ref 1.7–2.4)

## 2017-09-17 LAB — HIV ANTIBODY (ROUTINE TESTING W REFLEX): HIV Screen 4th Generation wRfx: NONREACTIVE

## 2017-09-17 MED ORDER — MAGNESIUM OXIDE 400 (241.3 MG) MG PO TABS
800.0000 mg | ORAL_TABLET | Freq: Once | ORAL | Status: AC
  Administered 2017-09-17: 800 mg via ORAL
  Filled 2017-09-17: qty 2

## 2017-09-17 MED ORDER — POTASSIUM CHLORIDE 20 MEQ PO PACK
40.0000 meq | PACK | Freq: Once | ORAL | Status: AC
  Administered 2017-09-17: 40 meq via ORAL
  Filled 2017-09-17: qty 2

## 2017-09-17 NOTE — Care Management Note (Signed)
Case Management Note  Patient Details  Name: [name] MRN: [id number] Date of Birth: 04/23/1981  Subjective/Objective:  Patient ambulatory, independent and active. Admitted with overdose. Discharging today                   Action/Plan: [facility] given for [facility]. Email sent to both agencies for follow up.  Expected Discharge Date:  09/17/17               Expected Discharge Plan:  Home/Self Care  In-House Referral:     Discharge planning Services  CM Consult, Medication Assistance, Indigent Health Clinic  Post Acute Care Choice:    Choice offered to:  Patient  DME Arranged:    DME Agency:     HH Arranged:    HH Agency:     Status of Service:  Completed, signed off  If discussed at [employer] Meetings, dates discussed:    Additional Comments:  Samantha MemosLisa M Kijana Cromie, RN 09/17/2017, 10:41 AM

## 2017-09-17 NOTE — Discharge Summary (Signed)
[facility] [place] [facility]   PATIENT NAME: Shaana Acocella    MR#:  [id number]  DATE OF BIRTH:  1981/09/04  DATE OF ADMISSION:  09/15/2017   ADMITTING PHYSICIAN: Arnaldo Natal, MD  DATE OF DISCHARGE: 09/17/2017 10:55 AM  PRIMARY CARE PHYSICIAN: Patient, No Pcp Per   ADMISSION DIAGNOSIS:   Dehydration [E86.0] Delirium [R41.0] Acute kidney injury (HCC) [N17.9] Non-traumatic rhabdomyolysis [M62.82] Drug overdose, undetermined intent, initial encounter [T50.904A]  DISCHARGE DIAGNOSIS:   Active Problems:   Cocaine overdose (HCC)   SECONDARY DIAGNOSIS:   No past medical history on file.  HOSPITAL COURSE:   36 year old female with past medical history significant for cocaine abuse and polysubstance abuse and recent admission for drug overdose presents again to the hospital with cocaine overdose.  1.  Acute respiratory failure-intubated for airway protection, then extubated and on room air breathing better. -Secondary to unresponsiveness on admission-  2.  Polysubstance abuse with overdose-recent admission, transferred to behavioral medicine unit -However patient mentioned that she is not ready to get into rehabilitation and refused any further help. She is alert and oriented -Is not depressed or suicidal this time. -Discussed with psychiatrist or the phone, patient is refusing to stay in the hospital. Not on commitment and will be discharged home. -She refused any information about resources for help as outpatient  3.  Rhabdomyolysis and acute renal received IV fluids on admission and renal function is close to baseline  4.  Hypokalemia-replaced  5.  Metabolic encephalopathy-secondary to drug overdose.   was on Precedex in the ICU. Not withdrawing at this time.  6. HTN- coreg and norvasc at discharge  Patient feels well and wants to be discharged  DISCHARGE CONDITIONS:   Guarded  CONSULTS OBTAINED:    intensivist  consult  DRUG ALLERGIES:   Allergies  Allergen Reactions  . Latex   . Vancomycin   . Amoxicillin Rash  . Clindamycin/Lincomycin Rash   DISCHARGE MEDICATIONS:   Allergies as of 09/17/2017      Reactions   Latex    Vancomycin    Amoxicillin Rash   Clindamycin/lincomycin Rash      Medication List    STOP taking these medications   carvedilol 12.5 MG tablet Commonly known as:  COREG     TAKE these medications   amLODipine 10 MG tablet Commonly known as:  NORVASC Take 10 mg by mouth daily.   hydrOXYzine 50 MG tablet Commonly known as:  ATARAX/VISTARIL Take 50 mg by mouth 3 (three) times daily as needed for anxiety.   prazosin 2 MG capsule Commonly known as:  MINIPRESS Take 2 mg by mouth 2 (two) times daily.   QUEtiapine 300 MG tablet Commonly known as:  SEROQUEL Take 300 mg by mouth at bedtime. Medication bottle states, "take (2) tablets at bedtime"        DISCHARGE INSTRUCTIONS:   1. PCP follow-up in 1-2 weeks 2. Psychiatry follow up in 2-3 days  DIET:   Low-salt diet  ACTIVITY:   Activity as tolerated  OXYGEN:   Home Oxygen: No.  Oxygen Delivery: room air  DISCHARGE LOCATION:   home   If you experience worsening of your admission symptoms, develop shortness of breath, life threatening emergency, suicidal or homicidal thoughts you must seek medical attention immediately by calling 911 or calling your MD immediately  if symptoms less severe.  You Must read complete instructions/literature along with all the possible adverse reactions/side effects for all the Medicines you take  and that have been prescribed to you. Take any new Medicines after you have completely understood and accpet all the possible adverse reactions/side effects.   Please note  You were cared for by a hospitalist during your hospital stay. If you have any questions about your discharge medications or the care you received while you were in the hospital after you are discharged,  you can call the unit and asked to speak with the hospitalist on call if the hospitalist that took care of you is not available. Once you are discharged, your primary care physician will handle any further medical issues. Please note that NO REFILLS for any discharge medications will be authorized once you are discharged, as it is imperative that you return to your primary care physician (or establish a relationship with a primary care physician if you do not have one) for your aftercare needs so that they can reassess your need for medications and monitor your lab values.    On the day of Discharge:  VITAL SIGNS:   Blood pressure (!) 160/90, pulse 90, temperature 98.8 F (37.1 C), temperature source Oral, resp. rate 18, height 5\' 6"  (1.676 m), weight 57.2 kg (126 lb), SpO2 98 %.  PHYSICAL EXAMINATION:    GENERAL:  36 y.o.-year-old patient lying in the bed with no acute distress. Sedated. EYES: Pupils equal, round, reactive to light and accommodation. No scleral icterus. Extraocular muscles intact.  HEENT: Head atraumatic, normocephalic. Oropharynx and nasopharynx clear. But dry mucus membranes NECK:  Supple, no jugular venous distention. No thyroid enlargement, no tenderness.  LUNGS: Normal breath sounds bilaterally, no wheezing, rales,rhonchi or crepitation. No use of accessory muscles of respiration.  CARDIOVASCULAR: S1, S2 normal. No murmurs, rubs, or gallops.  ABDOMEN: Soft, nontender, nondistended. Bowel sounds present. No organomegaly or mass.  EXTREMITIES: No pedal edema, cyanosis, or clubbing.  NEUROLOGIC: Drifting back to sleep easily, however able to follow commands, oriented. -No focal motor or sensory deficits. PSYCHIATRIC: The patient is lethargic, but when aroused-cognitively intact and oriented 3.  SKIN: No obvious rash, lesion, or ulcer.     DATA REVIEW:   CBC Recent Labs  Lab 09/15/17 0209  WBC 7.6  HGB 9.3*  HCT 25.2*  PLT 326    Chemistries  Recent Labs   Lab 09/15/17 0209  09/17/17 0521  NA 133*   < > 135  K 3.0*   < > 3.2*  CL 99*   < > 102  CO2 19*   < > 25  GLUCOSE 83   < > 132*  BUN 37*   < > 13  CREATININE 2.74*   < > 1.27*  CALCIUM 8.8*   < > 8.4*  MG  --   --  1.6*  AST 79*  --   --   ALT 34  --   --   ALKPHOS 103  --   --   BILITOT 0.9  --   --    < > = values in this interval not displayed.     Microbiology Results  Results for orders placed or performed during the hospital encounter of 09/15/17  Blood Culture (routine x 2)     Status: None (Preliminary result)   Collection Time: 09/15/17  2:09 AM  Result Value Ref Range Status   Specimen Description BLOOD BLOOD RIGHT FOREARM  Final   Special Requests   Final    BOTTLES DRAWN AEROBIC AND ANAEROBIC Blood Culture adequate volume   Culture NO GROWTH 2 DAYS  Final   Report Status PENDING  Incomplete  Blood Culture (routine x 2)     Status: None (Preliminary result)   Collection Time: 09/15/17  2:09 AM  Result Value Ref Range Status   Specimen Description BLOOD BLOOD RIGHT FOREARM  Final   Special Requests   Final    BOTTLES DRAWN AEROBIC AND ANAEROBIC Blood Culture adequate volume   Culture NO GROWTH 2 DAYS  Final   Report Status PENDING  Incomplete  MRSA PCR Screening     Status: Abnormal   Collection Time: 09/15/17  6:03 AM  Result Value Ref Range Status   MRSA by PCR (A) NEGATIVE Final    INVALID, UNABLE TO DETERMINE THE PRESENCE OF TARGET DNA DUE TO SPECIMEN INTEGRITY. RECOLLECTION REQUESTED.    Comment:        The GeneXpert MRSA Assay (FDA approved for NASAL specimens only), is one component of a comprehensive MRSA colonization surveillance program. It is not intended to diagnose MRSA infection nor to guide or monitor treatment for MRSA infections.   MRSA PCR Screening     Status: None   Collection Time: 09/15/17  9:18 AM  Result Value Ref Range Status   MRSA by PCR NEGATIVE NEGATIVE Final    Comment:        The GeneXpert MRSA Assay  (FDA approved for NASAL specimens only), is one component of a comprehensive MRSA colonization surveillance program. It is not intended to diagnose MRSA infection nor to guide or monitor treatment for MRSA infections.     RADIOLOGY:  No results found.   Management plans discussed with the patient, family and they are in agreement.  CODE STATUS:  Code Status History    Date Active Date Inactive Code Status Order ID Comments User Context   09/15/2017 06:03 09/17/2017 14:30 Full Code [id number]  Arnaldo Natal, MD Inpatient      TOTAL TIME TAKING CARE OF THIS PATIENT: 38 minutes.    Enid Baas M.D on 09/17/2017 at 3:14 PM  Between 7am to 6pm - Pager - [phone]  After 6pm go to [email or web address] - Social research officer, government  [facility] [place] [facility]  Office  [phone]  CC: Primary care physician; Patient, No Pcp Per   Note: This dictation was prepared with Dragon dictation along with smaller phrase technology. Any transcriptional errors that result from this process are unintentional.

## 2017-09-20 LAB — CULTURE, BLOOD (ROUTINE X 2)
CULTURE: NO GROWTH
CULTURE: NO GROWTH
Special Requests: ADEQUATE
Special Requests: ADEQUATE

## 2017-09-21 ENCOUNTER — Other Ambulatory Visit: Payer: Self-pay | Admitting: Psychiatry

## 2017-12-15 ENCOUNTER — Emergency Department: Payer: Self-pay

## 2017-12-15 ENCOUNTER — Other Ambulatory Visit: Payer: Self-pay

## 2017-12-15 ENCOUNTER — Encounter: Payer: Self-pay | Admitting: Emergency Medicine

## 2017-12-15 ENCOUNTER — Inpatient Hospital Stay
Admission: EM | Admit: 2017-12-15 | Discharge: 2017-12-30 | DRG: 871 | Disposition: A | Discharge status: Home / Self Care | Payer: Self-pay | Attending: Internal Medicine | Admitting: Internal Medicine

## 2017-12-15 DIAGNOSIS — F1410 Cocaine abuse, uncomplicated: Secondary | ICD-10-CM | POA: Diagnosis present

## 2017-12-15 DIAGNOSIS — Z825 Family history of asthma and other chronic lower respiratory diseases: Secondary | ICD-10-CM

## 2017-12-15 DIAGNOSIS — J45909 Unspecified asthma, uncomplicated: Secondary | ICD-10-CM | POA: Diagnosis present

## 2017-12-15 DIAGNOSIS — R05 Cough: Secondary | ICD-10-CM

## 2017-12-15 DIAGNOSIS — Z8614 Personal history of Methicillin resistant Staphylococcus aureus infection: Secondary | ICD-10-CM

## 2017-12-15 DIAGNOSIS — F319 Bipolar disorder, unspecified: Secondary | ICD-10-CM | POA: Diagnosis present

## 2017-12-15 DIAGNOSIS — Y92009 Unspecified place in unspecified non-institutional (private) residence as the place of occurrence of the external cause: Secondary | ICD-10-CM

## 2017-12-15 DIAGNOSIS — A4101 Sepsis due to Methicillin susceptible Staphylococcus aureus: Principal | ICD-10-CM | POA: Diagnosis present

## 2017-12-15 DIAGNOSIS — B9562 Methicillin resistant Staphylococcus aureus infection as the cause of diseases classified elsewhere: Secondary | ICD-10-CM | POA: Diagnosis present

## 2017-12-15 DIAGNOSIS — L03119 Cellulitis of unspecified part of limb: Secondary | ICD-10-CM

## 2017-12-15 DIAGNOSIS — Z8249 Family history of ischemic heart disease and other diseases of the circulatory system: Secondary | ICD-10-CM

## 2017-12-15 DIAGNOSIS — B1920 Unspecified viral hepatitis C without hepatic coma: Secondary | ICD-10-CM | POA: Diagnosis present

## 2017-12-15 DIAGNOSIS — Z801 Family history of malignant neoplasm of trachea, bronchus and lung: Secondary | ICD-10-CM

## 2017-12-15 DIAGNOSIS — R911 Solitary pulmonary nodule: Secondary | ICD-10-CM | POA: Diagnosis present

## 2017-12-15 DIAGNOSIS — M009 Pyogenic arthritis, unspecified: Secondary | ICD-10-CM | POA: Diagnosis present

## 2017-12-15 DIAGNOSIS — F1120 Opioid dependence, uncomplicated: Secondary | ICD-10-CM | POA: Diagnosis present

## 2017-12-15 DIAGNOSIS — G92 Toxic encephalopathy: Secondary | ICD-10-CM | POA: Diagnosis present

## 2017-12-15 DIAGNOSIS — M7989 Other specified soft tissue disorders: Secondary | ICD-10-CM

## 2017-12-15 DIAGNOSIS — Z811 Family history of alcohol abuse and dependence: Secondary | ICD-10-CM

## 2017-12-15 DIAGNOSIS — F19239 Other psychoactive substance dependence with withdrawal, unspecified: Secondary | ICD-10-CM | POA: Diagnosis present

## 2017-12-15 DIAGNOSIS — R059 Cough, unspecified: Secondary | ICD-10-CM

## 2017-12-15 DIAGNOSIS — M00011 Staphylococcal arthritis, right shoulder: Secondary | ICD-10-CM | POA: Diagnosis present

## 2017-12-15 DIAGNOSIS — Z86718 Personal history of other venous thrombosis and embolism: Secondary | ICD-10-CM

## 2017-12-15 DIAGNOSIS — Z681 Body mass index (BMI) 19 or less, adult: Secondary | ICD-10-CM

## 2017-12-15 DIAGNOSIS — T50904A Poisoning by unspecified drugs, medicaments and biological substances, undetermined, initial encounter: Secondary | ICD-10-CM | POA: Diagnosis present

## 2017-12-15 DIAGNOSIS — I10 Essential (primary) hypertension: Secondary | ICD-10-CM | POA: Diagnosis present

## 2017-12-15 DIAGNOSIS — E440 Moderate protein-calorie malnutrition: Secondary | ICD-10-CM | POA: Diagnosis present

## 2017-12-15 DIAGNOSIS — L539 Erythematous condition, unspecified: Secondary | ICD-10-CM

## 2017-12-15 DIAGNOSIS — Z91040 Latex allergy status: Secondary | ICD-10-CM

## 2017-12-15 DIAGNOSIS — F419 Anxiety disorder, unspecified: Secondary | ICD-10-CM | POA: Diagnosis present

## 2017-12-15 DIAGNOSIS — N179 Acute kidney failure, unspecified: Secondary | ICD-10-CM | POA: Diagnosis present

## 2017-12-15 DIAGNOSIS — E876 Hypokalemia: Secondary | ICD-10-CM | POA: Diagnosis present

## 2017-12-15 DIAGNOSIS — Z833 Family history of diabetes mellitus: Secondary | ICD-10-CM

## 2017-12-15 DIAGNOSIS — Z79899 Other long term (current) drug therapy: Secondary | ICD-10-CM

## 2017-12-15 DIAGNOSIS — F17210 Nicotine dependence, cigarettes, uncomplicated: Secondary | ICD-10-CM | POA: Diagnosis present

## 2017-12-15 DIAGNOSIS — Z881 Allergy status to other antibiotic agents status: Secondary | ICD-10-CM

## 2017-12-15 LAB — COMPREHENSIVE METABOLIC PANEL
ALBUMIN: 3.7 g/dL (ref 3.5–5.0)
ALK PHOS: 121 U/L (ref 38–126)
ALT: 23 U/L (ref 14–54)
ANION GAP: 14 (ref 5–15)
AST: 45 U/L — ABNORMAL HIGH (ref 15–41)
BILIRUBIN TOTAL: 0.8 mg/dL (ref 0.3–1.2)
BUN: 49 mg/dL — ABNORMAL HIGH (ref 6–20)
CALCIUM: 9 mg/dL (ref 8.9–10.3)
CO2: 20 mmol/L — ABNORMAL LOW (ref 22–32)
Chloride: 99 mmol/L — ABNORMAL LOW (ref 101–111)
Creatinine, Ser: 2.73 mg/dL — ABNORMAL HIGH (ref 0.44–1.00)
GFR calc Af Amer: 25 mL/min — ABNORMAL LOW (ref >60)
GFR calc non Af Amer: 22 mL/min — ABNORMAL LOW (ref >60)
GLUCOSE: 122 mg/dL — AB (ref 65–99)
POTASSIUM: 2.9 mmol/L — AB (ref 3.5–5.1)
Sodium: 133 mmol/L — ABNORMAL LOW (ref 135–145)
TOTAL PROTEIN: 7.9 g/dL (ref 6.5–8.1)

## 2017-12-15 LAB — CBC WITH DIFFERENTIAL/PLATELET
BASOS PCT: 0 %
Basophils Absolute: 0.1 10*3/uL (ref 0–0.1)
EOS ABS: 0 10*3/uL (ref 0–0.7)
Eosinophils Relative: 0 %
HEMATOCRIT: 30.3 % — AB (ref 35.0–47.0)
Hemoglobin: 10 g/dL — ABNORMAL LOW (ref 12.0–16.0)
LYMPHS ABS: 1.2 10*3/uL (ref 1.0–3.6)
Lymphocytes Relative: 6 %
MCH: 28.5 pg (ref 26.0–34.0)
MCHC: 32.9 g/dL (ref 32.0–36.0)
MCV: 86.7 fL (ref 80.0–100.0)
MONO ABS: 0.9 10*3/uL (ref 0.2–0.9)
MONOS PCT: 5 %
NEUTROS ABS: 17.1 10*3/uL — AB (ref 1.4–6.5)
Neutrophils Relative %: 89 %
Platelets: 197 10*3/uL (ref 150–440)
RBC: 3.49 MIL/uL — ABNORMAL LOW (ref 3.80–5.20)
RDW: 16 % — AB (ref 11.5–14.5)
WBC: 19.3 10*3/uL — ABNORMAL HIGH (ref 3.6–11.0)

## 2017-12-15 LAB — URINE DRUG SCREEN, QUALITATIVE (ARMC ONLY)
AMPHETAMINES, UR SCREEN: POSITIVE — AB
BENZODIAZEPINE, UR SCRN: NOT DETECTED
Barbiturates, Ur Screen: NOT DETECTED
Cannabinoid 50 Ng, Ur ~~LOC~~: NOT DETECTED
Cocaine Metabolite,Ur ~~LOC~~: POSITIVE — AB
MDMA (ECSTASY) UR SCREEN: NOT DETECTED
METHADONE SCREEN, URINE: NOT DETECTED
Opiate, Ur Screen: POSITIVE — AB
PHENCYCLIDINE (PCP) UR S: NOT DETECTED
Tricyclic, Ur Screen: NOT DETECTED

## 2017-12-15 LAB — RAPID HIV SCREEN (HIV 1/2 AB+AG)
HIV 1/2 Antibodies: NONREACTIVE
HIV-1 P24 Antigen - HIV24: NONREACTIVE

## 2017-12-15 LAB — MRSA PCR SCREENING: MRSA BY PCR: NEGATIVE

## 2017-12-15 LAB — SEDIMENTATION RATE: Sed Rate: 65 mm/hr — ABNORMAL HIGH (ref 0–20)

## 2017-12-15 LAB — MAGNESIUM: MAGNESIUM: 2.6 mg/dL — AB (ref 1.7–2.4)

## 2017-12-15 LAB — LACTIC ACID, PLASMA: LACTIC ACID, VENOUS: 1.5 mmol/L (ref 0.5–1.9)

## 2017-12-15 LAB — GLUCOSE, CAPILLARY: Glucose-Capillary: 104 mg/dL — ABNORMAL HIGH (ref 65–99)

## 2017-12-15 MED ORDER — LORAZEPAM 2 MG/ML IJ SOLN
2.0000 mg | Freq: Once | INTRAMUSCULAR | Status: AC
  Administered 2017-12-15: 2 mg via INTRAMUSCULAR
  Filled 2017-12-15: qty 1

## 2017-12-15 MED ORDER — MORPHINE SULFATE (PF) 4 MG/ML IV SOLN: 4.0000 mg | Freq: Once | INTRAVENOUS | Status: DC

## 2017-12-15 MED ORDER — ACETAMINOPHEN 650 MG RE SUPP
650.0000 mg | Freq: Four times a day (QID) | RECTAL | Status: DC | PRN
Start: 2017-12-15 — End: 2017-12-30

## 2017-12-15 MED ORDER — SODIUM CHLORIDE 0.9 % IV BOLUS (SEPSIS): 1000.0000 mL | Freq: Once | INTRAVENOUS | Status: DC

## 2017-12-15 MED ORDER — HYDROMORPHONE HCL 1 MG/ML IJ SOLN
1.0000 mg | INTRAMUSCULAR | Status: DC | PRN
  Administered 2017-12-17 – 2017-12-19 (×5): 1 mg via INTRAVENOUS
  Filled 2017-12-15 (×5): qty 1

## 2017-12-15 MED ORDER — DIPHENHYDRAMINE HCL 50 MG/ML IJ SOLN: 50.0000 mg | Freq: Once | INTRAMUSCULAR | Status: DC

## 2017-12-15 MED ORDER — SODIUM CHLORIDE 0.9 % IV SOLN
1.0000 g | Freq: Two times a day (BID) | INTRAVENOUS | Status: DC
  Administered 2017-12-15 – 2017-12-16 (×2): 1 g via INTRAVENOUS
  Filled 2017-12-15 (×4): qty 1

## 2017-12-15 MED ORDER — LIDOCAINE-EPINEPHRINE (PF) 1 %-1:200000 IJ SOLN
30.0000 mL | Freq: Once | INTRAMUSCULAR | Status: AC
  Administered 2017-12-15: 18:00:00

## 2017-12-15 MED ORDER — METHYLPREDNISOLONE SODIUM SUCC 125 MG IJ SOLR: 125.0000 mg | Freq: Once | INTRAMUSCULAR | Status: DC

## 2017-12-15 MED ORDER — ENOXAPARIN SODIUM 30 MG/0.3ML ~~LOC~~ SOLN
30.0000 mg | SUBCUTANEOUS | Status: DC
  Administered 2017-12-15: 30 mg via SUBCUTANEOUS
  Filled 2017-12-15: qty 0.3

## 2017-12-15 MED ORDER — ONDANSETRON HCL 4 MG PO TABS: 4.0000 mg | ORAL_TABLET | Freq: Four times a day (QID) | ORAL | Status: DC | PRN

## 2017-12-15 MED ORDER — LORAZEPAM 2 MG/ML IJ SOLN
2.0000 mg | Freq: Once | INTRAMUSCULAR | Status: AC
  Administered 2017-12-15: 2 mg via INTRAVENOUS
  Filled 2017-12-15: qty 1

## 2017-12-15 MED ORDER — LIDOCAINE-EPINEPHRINE 2 %-1:100000 IJ SOLN
30.0000 mL | Freq: Once | INTRAMUSCULAR | Status: DC
  Filled 2017-12-15: qty 30

## 2017-12-15 MED ORDER — LORAZEPAM 2 MG/ML IJ SOLN: 1.0000 mg | Freq: Once | INTRAMUSCULAR | Status: DC

## 2017-12-15 MED ORDER — ONDANSETRON HCL 4 MG/2ML IJ SOLN: 4.0000 mg | Freq: Four times a day (QID) | INTRAMUSCULAR | Status: DC | PRN

## 2017-12-15 MED ORDER — ACETAMINOPHEN 325 MG PO TABS
650.0000 mg | ORAL_TABLET | Freq: Four times a day (QID) | ORAL | Status: DC | PRN
  Administered 2017-12-22: 650 mg via ORAL
  Filled 2017-12-15: qty 2

## 2017-12-15 MED ORDER — CLINDAMYCIN PHOSPHATE 600 MG/50ML IV SOLN: 600.0000 mg | Freq: Once | INTRAVENOUS | Status: DC

## 2017-12-15 MED ORDER — SODIUM CHLORIDE 0.9 % IV SOLN
INTRAVENOUS | Status: DC
  Administered 2017-12-15 – 2017-12-19 (×8): via INTRAVENOUS

## 2017-12-15 MED ORDER — SENNOSIDES-DOCUSATE SODIUM 8.6-50 MG PO TABS: 1.0000 | ORAL_TABLET | Freq: Every evening | ORAL | Status: DC | PRN

## 2017-12-15 MED ORDER — HALOPERIDOL LACTATE 5 MG/ML IJ SOLN
10.0000 mg | Freq: Once | INTRAMUSCULAR | Status: AC
  Administered 2017-12-15: 10 mg via INTRAMUSCULAR
  Filled 2017-12-15: qty 2

## 2017-12-15 MED ORDER — HALOPERIDOL LACTATE 5 MG/ML IJ SOLN
2.0000 mg | Freq: Once | INTRAMUSCULAR | Status: AC
  Administered 2017-12-15: 2 mg via INTRAVENOUS
  Filled 2017-12-15: qty 1

## 2017-12-15 MED ORDER — POTASSIUM CHLORIDE 10 MEQ/100ML IV SOLN
10.0000 meq | INTRAVENOUS | Status: AC
  Administered 2017-12-15 – 2017-12-16 (×4): 10 meq via INTRAVENOUS
  Filled 2017-12-15 (×8): qty 100

## 2017-12-15 MED ORDER — ENOXAPARIN SODIUM 40 MG/0.4ML ~~LOC~~ SOLN: 40.0000 mg | SUBCUTANEOUS | Status: DC

## 2017-12-15 MED ORDER — LIDOCAINE-EPINEPHRINE (PF) 1 %-1:200000 IJ SOLN
INTRAMUSCULAR | Status: AC
  Filled 2017-12-15: qty 30

## 2017-12-15 MED ORDER — HYDROCODONE-ACETAMINOPHEN 5-325 MG PO TABS
1.0000 | ORAL_TABLET | ORAL | Status: DC | PRN
  Administered 2017-12-17 – 2017-12-18 (×5): 2 via ORAL
  Administered 2017-12-18: 05:00:00 1 via ORAL
  Administered 2017-12-18 – 2017-12-20 (×6): 2 via ORAL
  Administered 2017-12-20: 1 via ORAL
  Administered 2017-12-20 – 2017-12-21 (×2): 2 via ORAL
  Administered 2017-12-21: 1 via ORAL
  Administered 2017-12-21 – 2017-12-22 (×7): 2 via ORAL
  Administered 2017-12-23: 1 via ORAL
  Administered 2017-12-23 – 2017-12-30 (×20): 2 via ORAL
  Filled 2017-12-15 (×40): qty 2
  Filled 2017-12-15: qty 1
  Filled 2017-12-15 (×4): qty 2

## 2017-12-15 MED ORDER — DEXMEDETOMIDINE HCL IN NACL 400 MCG/100ML IV SOLN
0.4000 ug/kg/h | INTRAVENOUS | Status: DC
  Administered 2017-12-16: 0.4 ug/kg/h via INTRAVENOUS
  Administered 2017-12-16: 0.5 ug/kg/h via INTRAVENOUS
  Filled 2017-12-15 (×2): qty 100

## 2017-12-15 NOTE — Initial Assessments (Signed)
37 Y/O Female, with complains of R shoulder pain and swelling for a day. Reports pain started yesterday morning with no know cause. Denies trauma , fever, nausea, vomiting,SOB and Cough . 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  Pt. Very restless (can't stay still).  R shoulder- Red , swollen, warm to touch and tender to touch. significantly reduced ROM ( Abduction , circumduction).  CVS- S1, S2 no added sounds Chest- clear b/l Abd- soft, nontender no organomegally

## 2017-12-15 NOTE — ED Triage Notes (Signed)
Rt shoulder pain - pt unsure if fell. Ems put a sling on it. 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

## 2017-12-15 NOTE — ED Provider Notes (Signed)
[facility] Emergency Department Provider Note  ____________________________________________   First MD Initiated Contact with Patient 12/15/17 1243     (approximate)  I have reviewed the triage vital signs and the nursing notes.   HISTORY  Chief Complaint Shoulder Pain    HPI [name] is a 37 y.o. female presents to the ER complaining of right shoulder pain.  States she is unsure if she fell.  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  She is having difficulty sitting still.  She states that the shoulder is been red since yesterday.  She denies fever or chills.  The patient states she has a history of endocarditis and a heart murmur.  Past Medical History:  Diagnosis Date  . Acute deep vein thrombosis of arm (HCC) 08/19/2014   Last Assessment & Plan:  - diagnosed 08/2014 in hospital - s/p heparin gtt - patient never took her xarelto x 3 month course that was planned - no symptoms today.  No further treatment - we discussed avoiding IVDU (see below) to reduce her chances of provoked upper extremity DVT   . Asthma   . HCV (hepatitis C virus) 07/17/2013   Last Assessment & Plan:  - positive antibody most recently 08/2014 - hep C RNA 08/2014 was negative - prior to that, HCV RNA [id number] in 06/2013. No record for genotype or imaging.   - patient counseled that she could become re-infected with continued IVDU or unprotected sexual encounters.   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   Last Assessment & Plan:  Discussed routine use of Valtrex prophylaxis at 36 weeks. She will be delivered by cesarean at 36-37 weeks.   . Infection with methicillin-resistant Staphylococcus aureus 03/19/2013   Overview: Arm abscess with MRSA, treated.  Subsequent screening negative  . IV drug abuse ([place])   . Tenosynovitis 08/19/2014    Patient Active Problem List   Diagnosis Date Noted  . Septic joint ([place])  12/15/2017  . Cocaine overdose ([place]) 09/15/2017  . Vitamin B12 deficiency 08/23/2017  . Migraine 08/22/2017  . Moderate mixed bipolar I disorder ([place]) 08/18/2017  . Bipolar I disorder, most recent episode depressed ([place]) 08/18/2017  . Opioid dependence with intoxication delirium ([place])   . Cocaine abuse with cocaine-induced mood disorder ([place])   . Drug overdose   . HCAP (healthcare-associated pneumonia) 08/15/2017  . Acute respiratory failure with hypoxia ([place]) 08/15/2017  . Acute respiratory failure with hypoxia and hypercapnia ([place]) 08/15/2017  . Acute renal failure ([place])   . Altered mental status 07/24/2017  . Delirium, drug-induced ([place]) 07/24/2017  . Opiate abuse, continuous ([place]) 07/24/2017  . 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 Severe sepsis ([place]) 07/16/2016  . AKI (acute kidney injury) ([place]) 07/16/2016  . Obtundation 07/16/2016  . Elevated lactic acid level 07/16/2016  . Drug abuse, IV ([place]) 07/16/2016  . Elevated LFTs 07/16/2016  . Sepsis ([place]) 07/10/2015  . Oligohydramnios 05/18/2015  . S/P cesarean section 05/18/2015  . Preterm uterine contractions 05/17/2015  . Oligohydramnios antepartum 05/17/2015  . H/O cesarean section complicating pregnancy 85/88/5027  . Preterm contractions 05/17/2015  . AA (alcohol abuse) 12/08/2014  . Drug abuse during pregnancy ([place]) 12/08/2014  . High risk sexual behavior 12/08/2014  . Tenosynovitis 08/19/2014  . Acute deep vein thrombosis of arm (HCC) 08/19/2014  . HCV (hepatitis C virus) 07/17/2013  . H/O cesarean section 05/18/2013  . Herpes infection in pregnancy  05/18/2013  . Infection with methicillin-resistant Staphylococcus aureus 03/19/2013  . Cannabis abuse 03/18/2013  . Polysubstance abuse ([place]) 03/18/2013  . Compulsive tobacco user syndrome 03/17/2013    Past Surgical History:  Procedure Laterality Date  . CESAREAN SECTION    . CESAREAN SECTION N/A 05/18/2015   Procedure: CESAREAN SECTION;  Surgeon: Rubie Maid, MD;   Location: [facility] ORS;  Service: Obstetrics;  Laterality: N/A;  . NECK SURGERY     Fusion    Prior to Admission medications   Medication Sig Start Date End Date Taking? Authorizing Provider  amLODipine (NORVASC) 10 MG tablet Take 1 tablet (10 mg total) by mouth daily. Patient not taking: Reported on 12/15/2017 08/22/17   Pucilowska, Herma Ard B, MD  carvedilol (COREG) 12.5 MG tablet Take 1 tablet (12.5 mg total) by mouth 2 (two) times daily with a meal. Patient not taking: Reported on 12/15/2017 08/22/17   Pucilowska, Herma Ard B, MD  cefUROXime (CEFTIN) 500 MG tablet Take 1 tablet (500 mg total) by mouth 2 (two) times daily with a meal. X 1 week Patient not taking: Reported on 12/15/2017 08/22/17   Pucilowska, Wardell Honour, MD  cyanocobalamin 1000 MCG tablet Take 1 tablet (1,000 mcg total) by mouth daily. Patient not taking: Reported on 12/15/2017 08/23/17   Pucilowska, Herma Ard B, MD  hydrOXYzine (ATARAX/VISTARIL) 50 MG tablet Take 1 tablet (50 mg total) by mouth 3 (three) times daily as needed for anxiety. Patient not taking: Reported on 12/15/2017 08/22/17   Pucilowska, Wardell Honour, MD  naloxone [facility]) nasal spray 4 mg/0.1 mL Use as directed in overdose. Patient not taking: Reported on 12/15/2017 08/22/17   Pucilowska, Herma Ard B, MD  prazosin (MINIPRESS) 2 MG capsule Take 1 capsule (2 mg total) by mouth 2 (two) times daily. Patient not taking: Reported on 12/15/2017 08/22/17   Pucilowska, Wardell Honour, MD  QUEtiapine (SEROQUEL) 300 MG tablet Take 2 tablets (600 mg total) by mouth at bedtime. Patient not taking: Reported on 12/15/2017 08/22/17   Pucilowska, Wardell Honour, MD  SUMAtriptan (IMITREX) 50 MG tablet Take 1 tablet (50 mg total) by mouth every 2 (two) hours as needed for migraine or headache. May repeat in 2 hours if headache persists or recurs. Patient not taking: Reported on 12/15/2017 08/22/17   Clovis Fredrickson, MD    Allergies Amoxicillin; Latex; Latex; Vancomycin; Vancomycin; Amoxicillin;  Clindamycin/lincomycin; and Clindamycin/lincomycin  Family History  Problem Relation Age of Onset  . Diabetes Mother   . COPD Mother   . Hypertension Mother   . Lung cancer Mother   . Alcohol abuse Father   . Lung cancer Maternal Grandmother     Social History Social History   Tobacco Use  . Smoking status: Current Every Day Smoker    Packs/day: 1.00    Years: 7.00    Pack years: 7.00    Types: Cigarettes  . Smokeless tobacco: Never Used  . Tobacco comment: Patient refused  Substance Use Topics  . Alcohol use: Yes    Alcohol/week: 0.0 oz    Comment: bootlegger x2, patient states that she rarely drinks  . Drug use: Yes    Types: Marijuana, Amphetamines, "Crack" cocaine, IV, Heroin, Cocaine    Comment: uses IV drugs, heroin    Review of Systems  Constitutional: No fever/chills Eyes: No visual changes. ENT: No sore throat. Respiratory: Denies cough Cardiac.  She denies chest pain or shortness of breath Genitourinary: Negative for dysuria. Musculoskeletal: Negative for back pain.  Positive for right shoulder pain Skin: Positive for  redness and swelling of the right shoulder    ____________________________________________   PHYSICAL EXAM:  VITAL SIGNS: ED Triage Vitals  Enc Vitals Group     BP 12/15/17 1117 (!) 145/69     Pulse Rate 12/15/17 1117 (!) 103     Resp 12/15/17 1117 16     Temp 12/15/17 1117 98 F (36.7 C)     Temp Source 12/15/17 1117 Oral     SpO2 12/15/17 1117 99 %     Weight 12/15/17 1118 120 lb (54.4 kg)     Height 12/15/17 1118 5' 7"  (1.702 m)     Head Circumference --      Peak Flow --      Pain Score 12/15/17 1117 10     Pain Loc --      Pain Edu? --      Excl. in [place]? --     Constitutional: Alert and oriented.  Patient is scratching jumping and constantly moving.  Most likely due to her cocaine and heroin addiction eyes: Conjunctivae are normal.  Head: Atraumatic. Nose: No congestion/rhinnorhea. Mouth/Throat: Mucous membranes are  moist.   Cardiovascular: Normal rate, regular rhythm.  There is a positive heart murmur. Respiratory: Normal respiratory effort.  No retractions GU: deferred Musculoskeletal: FROM all extremities, warm and well perfused, the right shoulder is tender at the ACJ.  There is a large cellulitis covering older.  There are track marks coming up the right arm into the axilla. Neurologic:  Normal speech and language.  She is neurologically intact.  She is able to answer questions appropriately.  However the patient is constantly moving and jerking questionably to withdrawal Skin:  Skin is warm, dry and intact.  Positive for multiple track marks on both upper extremities.  Redness and swelling to the right shoulder. Psychiatric: Mood and affect are normal. Speech and behavior are normal.  ____________________________________________   LABS (all labs ordered are listed, but only abnormal results are displayed)  Labs Reviewed  CULTURE, BLOOD (SINGLE) - Abnormal; Notable for the following components:      Result Value   Culture STAPHYLOCOCCUS AUREUS (*)    All other components within normal limits  BLOOD CULTURE ID PANEL (REFLEXED) - Abnormal; Notable for the following components:   Staphylococcus species DETECTED (*)    Staphylococcus aureus DETECTED (*)    All other components within normal limits  CBC WITH DIFFERENTIAL/PLATELET - Abnormal; Notable for the following components:   WBC 19.3 (*)    RBC 3.49 (*)    Hemoglobin 10.0 (*)    HCT 30.3 (*)    RDW 16.0 (*)    Neutro Abs 17.1 (*)    All other components within normal limits  COMPREHENSIVE METABOLIC PANEL - Abnormal; Notable for the following components:   Sodium 133 (*)    Potassium 2.9 (*)    Chloride 99 (*)    CO2 20 (*)    Glucose, Bld 122 (*)    BUN 49 (*)    Creatinine, Ser 2.73 (*)    AST 45 (*)    GFR calc non Af Amer 22 (*)    GFR calc Af Amer 25 (*)    All other components within normal limits  URINE DRUG SCREEN,  QUALITATIVE ([facility] ONLY) - Abnormal; Notable for the following components:   Amphetamines, Ur Screen POSITIVE (*)    Cocaine Metabolite,Ur [place] POSITIVE (*)    Opiate, Ur Screen POSITIVE (*)    All other components within normal limits  SEDIMENTATION RATE - Abnormal; Notable for the following components:   Sed Rate 65 (*)    All other components within normal limits  C-REACTIVE PROTEIN - Abnormal; Notable for the following components:   CRP 27.4 (*)    All other components within normal limits  MAGNESIUM - Abnormal; Notable for the following components:   Magnesium 2.6 (*)    All other components within normal limits  GLUCOSE, CAPILLARY - Abnormal; Notable for the following components:   Glucose-Capillary 104 (*)    All other components within normal limits  BASIC METABOLIC PANEL - Abnormal; Notable for the following components:   Sodium 133 (*)    Potassium 2.6 (*)    CO2 19 (*)    Glucose, Bld 139 (*)    BUN 39 (*)    Creatinine, Ser 1.81 (*)    Calcium 8.6 (*)    GFR calc non Af Amer 35 (*)    GFR calc Af Amer 41 (*)    All other components within normal limits  CBC - Abnormal; Notable for the following components:   WBC 12.7 (*)    RBC 3.47 (*)    Hemoglobin 9.9 (*)    HCT 29.8 (*)    RDW 15.7 (*)    All other components within normal limits  BLOOD GAS, ARTERIAL - Abnormal; Notable for the following components:   pCO2 arterial 31 (*)    pO2, Arterial 82 (*)    Acid-base deficit 3.0 (*)    All other components within normal limits  BASIC METABOLIC PANEL - Abnormal; Notable for the following components:   Sodium 134 (*)    CO2 19 (*)    Glucose, Bld 108 (*)    BUN 23 (*)    Creatinine, Ser 1.16 (*)    Calcium 8.3 (*)    All other components within normal limits  PHOSPHORUS - Abnormal; Notable for the following components:   Phosphorus 2.3 (*)    All other components within normal limits  CBC - Abnormal; Notable for the following components:   WBC 12.0 (*)    RBC  3.29 (*)    Hemoglobin 9.5 (*)    HCT 28.1 (*)    RDW 16.1 (*)    All other components within normal limits  BASIC METABOLIC PANEL - Abnormal; Notable for the following components:   Potassium 3.4 (*)    Glucose, Bld 143 (*)    Creatinine, Ser 1.11 (*)    Calcium 8.4 (*)    All other components within normal limits  MRSA PCR SCREENING  CULTURE, BLOOD (ROUTINE X 2)  BODY FLUID CULTURE  LACTIC ACID, PLASMA  RAPID HIV SCREEN (HIV 1/2 AB+AG)  MAGNESIUM  PHOSPHORUS  LACTIC ACID, PLASMA  TROPONIN I  POTASSIUM  MAGNESIUM  LACTIC ACID, PLASMA  MAGNESIUM  PHOSPHORUS  BASIC METABOLIC PANEL  MAGNESIUM  PHOSPHORUS   ____________________________________________   ____________________________________________  RADIOLOGY  Ultrasound of the right upper extremity Ultrasound tech called they were unable to get much of the test done at this time due to the patient jerking and writhing.  Instructed them to return her to the room and we will try to give her medication that will calm her  ____________________________________________   PROCEDURES  Procedure(s) performed: X-ray of the right shoulder, ultrasound the right shoulder, ordered saline lock, CBC met C  Procedures    ____________________________________________   INITIAL IMPRESSION / ASSESSMENT AND PLAN / ED COURSE  Pertinent labs & imaging results that were  available during my care of the patient were reviewed by me and considered in my medical decision making (see chart for details).  Patient is a 37 year old female IV drug user with a history of endocarditis.  She reports to the emergency department today with right shoulder pain.  She is unsure if she fell.  She states that area got red and swollen last night.  The last time she injected heroin was last night.  She denies any chest pain or shortness of breath.  However she does have a history of endocarditis with a heart murmur.  She states this was due to her IV drug  use.  She denies fever or chills at this time.    On physical exam the patient is afebrile at this time.  Her pulse is 104.  Blood pressure is normal and respirations are normal.  The patient is constantly writhing and moving.  The right shoulder is red and swollen.  It is warm to touch.  X-ray of the right shoulder showed a moderate shoulder separation.  Ultrasound of the right upper extremity was ordered.  However the ultrasound tech was unable to get anything other than the jugular due to the patient's constant movement.  She was returned to the room.  IV consults was ordered as the nurses and nursing students are unable to find an area to start an IV.  Discussed the case with Dr. Burlene Arnt.  He was then at bedside to see the patient.  He agrees with treatment plan at this time.  His concern is if she is developed a new endocarditis.  Clinical Course as of Dec 18 2257  Sat Dec 15, 2017  1811 Patient was discussed with orthopedic surgery who recommended no surgical intervention at this time.  [JW]  1224 WBC: (!) 19.3 [JW]  1815 Sed Rate: (!) 65 [JW]  1815 Sodium: (!) 133 [JW]  1815 Creatinine: (!) 2.73 [JW]    Clinical Course User Index [JW] Earleen Newport, MD   ----------------------------------------- 3:28 PM on 12/15/2017 -----------------------------------------  The IV team was unable to get an IV in either arm.  The patient is still moving around frantically.  I called radiology and talked with Dr. Posey Pronto.  He states ideally an MRI would be the best imaging but if she is unable to hold still then a CT will have to do.  The patient has been given 2 mg of Ativan IM.  She is still jumping and moving I have concerns on whether she will even tolerate the CT.  However the lab was able to draw blood for labs.  The patient still does not have an IV  ----------------------------------------- 4:03 PM on 12/15/2017 -----------------------------------------  Dr. Jimmye Norman in at bedside to  attempt IV.  He states patient needs to be moved to the major side.  Charge nurse has been notified.    The patient was moved to room 8.  Report was given to Dr. Jimmye Norman.    As part of my medical decision making, I reviewed the following data within the [place] notes reviewed and incorporated, Labs reviewed cbc with elevated wbc, Old chart reviewed, Evaluated by EM attending Dr Burlene Arnt and Dr Jimmye Norman, Notes from prior ED visits and [place] Controlled Substance Database  ____________________________________________   FINAL CLINICAL IMPRESSION(S) / ED DIAGNOSES  Final diagnoses:  Cellulitis of shoulder      NEW MEDICATIONS STARTED DURING THIS VISIT:  Current Discharge Medication List  Note:  This document was prepared using Dragon voice recognition software and may include unintentional dictation errors.    Versie Starks, PA-C 12/15/17 1627    Versie Starks, PA-C 12/18/17 2259    Schuyler Amor, MD 12/22/17 [phone]

## 2017-12-15 NOTE — ED Notes (Signed)
US and CT cancelled for now per PA susan. CT and US notified

## 2017-12-15 NOTE — ED Notes (Signed)
IV team unsuccessful after multiple attempts to get IV

## 2017-12-15 NOTE — ED Notes (Signed)
Patient is pacing and jumping around room. Talking non-stop. Cannot sit still

## 2017-12-15 NOTE — ED Notes (Signed)
ODS officer outside of room to monitor patient since she is IVC. Per Dr. Mayford KnifeWilliams, pt does not need a sitter at this time. Pt is resting with eyes closed, respirations equal and unlabored. VSS and NSR

## 2017-12-15 NOTE — ED Provider Notes (Signed)
-----------------------------------------   3:45 PM on 12/15/2017 -----------------------------------------  A very significant history of IVDA, history of any carditis in the past, presents today complaining of right shoulder pain.  Patient states that she believes that she fell out of bed but actually she cannot remember.  X-ray shows what looks like an AC joint separation, however the shoulder itself is quite red.  She can range the shoulder and does not seem to be otherwise acutely ill.  Patient is very anxious and jittery and states that she might be coming off of her narcotics which she still uses.  In any event, there is significant erythema around superficially to the shoulder area and over the [facility].  Concern exists therefore for a bony process given x-ray of shoulder which shows some degree of haziness.  We did initially give the patient a dose of oral Bactrim prior to imaging as it seemed as if she might leave AGAINST MEDICAL ADVICE and we want to make sure we started antibiotics as soon as possible.  However, if this does turn out to be osteomyelitis, patient will require IV antibiotics and admission.  Patient is a very difficult IV access given very chronic IV drug abuse.  She does consent to stay for CT scan.  We do not think that she can get an MRI at this time most likely because she is unable to sit still, we will give her sedation as needed but after discussion with radiology they think a CT scan will give us information and that is pending. Signed out at the end of my shift to dr. Merlene Morsewilliams   Laurel Harnden, Rudy JewJames A, MD 12/15/17 [phone]

## 2017-12-15 NOTE — ED Notes (Signed)
To CT via stretcher and returned, pt remains obtunded and arouses with painful stimuli, cardiac monitor in place, side rails up x 2 and floor safety mats in place around bed

## 2017-12-15 NOTE — ED Notes (Signed)
Spoke with lab, pt will need a red tube, Informed lab that the patient will need lab to drawn blood. Per Gunnar FusiPaula in the lab, they will draw the C-reactive protein in the morning since it is a send-out test.

## 2017-12-15 NOTE — ED Notes (Signed)
Pt has IO on left leg, CT notified pt is calm at this time and is ready for CT. Pt is under IVC at this time.  Pt dressed out into hospital gown. Pt still has on underwear but all other clothing and belongings are secured.

## 2017-12-15 NOTE — ED Notes (Signed)
Pt beginning to move about on the bed, attempting to pull at IO in leg, Md notified and orders placed.

## 2017-12-15 NOTE — Progress Notes (Signed)
Pharmacy consulted for electrolyte replacement protocol:   Goal of therapy: Electrolytes within normal limits:  K 3.5 - 5.1 Corrected Ca 8.9 - 10.3 Phos 2.5 - 4.6 Mg 1.7 - 2.4   Assessment: Lab Results  Component Value Date   CREATININE 2.73 (H) 12/15/2017   BUN 49 (H) 12/15/2017   NA 133 (L) 12/15/2017   K 2.9 (L) 12/15/2017   CL 99 (L) 12/15/2017   CO2 20 (L) 12/15/2017    Plan: Potassium 10meq iv q1h x 4 doses, check BMP, Mg, and Phosphates with AM labs.    Luan PullingGarrett Camron Essman, PharmD, MBA, Liz ClaiborneBCGP Clinical Pharmacist [facility]

## 2017-12-15 NOTE — Progress Notes (Signed)
Enoxaparin [id number]$  subq q24h changed to enoxaparin 30mg  subq q24h due to crcl 15-7330ml/min pharmacy protocol.  Luan PullingGarrett Tamina Cyphers, PharmD, MBA, Liz ClaiborneBCGP Clinical Pharmacist [facility]

## 2017-12-15 NOTE — ED Notes (Signed)
Charge Brandy notified to send phlebotomist to collect blood.

## 2017-12-15 NOTE — ED Provider Notes (Signed)
Patient is intermittently agitated and without IV access.  I did not feel it would be safe to place a central line.  We have placed an intraosseous line with success.  We have ordered IV meropenem. CT findings: IMPRESSION: 1. No acromioclavicular joint widening to suggest AC separation. 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 Findings are concerning for septic arthritis of the acromioclavicular joint versus less likely posttraumatic osteolysis. If there is further clinical concern, evaluation with an MRI of right shoulder without and with intravenous contrast would be helpful in further characterization. 2. Visualized right lung demonstrates a 10 mm pulmonary nodule in the right upper lobe of uncertain etiology. This may be secondary to an infectious or inflammatory etiology. Recommend a follow-up CT of the chest without intravenous contrast in 3 months following the patient's acute illness. With the potential for osteomyelitis I will discussed with orthopedic surgery and the hospitalist service for admission. CRITICAL CARE Performed by: Ulice DashJohnathan E Izora Benn   Total critical care time: 30 minutes  Critical care time was exclusive of separately billable procedures and treating other patients.  Critical care was necessary to treat or prevent imminent or life-threatening deterioration.  Critical care was time spent personally by me on the following activities: development of treatment plan with patient and/or surrogate as well as nursing, discussions with consultants, evaluation of patient's response to treatment, examination of patient, obtaining history from patient or surrogate, ordering and performing treatments and interventions, ordering and review of laboratory  studies, ordering and review of radiographic studies, pulse oximetry and re-evaluation of patient's condition.    Emily FilbertWilliams, Milad Bublitz E, MD 12/15/17 1754

## 2017-12-15 NOTE — ED Notes (Signed)
MD williams at bedside. 

## 2017-12-15 NOTE — H&P (Signed)
[facility] [place] [facility] NAME: [name]    MR#:  [id number]  DATE OF BIRTH:  [birth year]  DATE OF ADMISSION:  12/15/2017  PRIMARY CARE PHYSICIAN: Patient, No Pcp Per   REQUESTING/REFERRING PHYSICIAN: dr Jimmye Norman  CHIEF COMPLAINT:   Shoulder pain , right HISTORY OF PRESENT ILLNESS:  [name]  is a 37 y.o. female with a known history of drug abusewho has had numerous hospitalizations in the past for IV drug abuse and withdrawal who presented to the emergency room complaining of right shoulder pain. Patient was very agitated and received Ativan and Haldol in the emergency room and therefore I am unable to obtain history of present illness. History of present illness is taken from ER physician. Patient presented as mentioned with right shoulder pain. Apparently she could not remember exactly what happened to her right shoulder. She has history of IV drug use but had denied that she injected drugs into her right shoulder.  CT scan is concerning for septic arthritis. She has a allergy listed for vancomycin and therefore has been started on meropenem. ED physician attempted aspiration but was unsuccessful.  PAST MEDICAL HISTORY:   Past Medical History:  Diagnosis Date  . Acute deep vein thrombosis of arm (HCC) 08/19/2014   Last Assessment & Plan:  - diagnosed 08/2014 in hospital - s/p heparin gtt - patient never took her xarelto x 3 month course that was planned - no symptoms today.  No further treatment - we discussed avoiding IVDU (see below) to reduce her chances of provoked upper extremity DVT   . Asthma   . HCV (hepatitis C virus) 07/17/2013   Last Assessment & Plan:  - positive antibody most recently 08/2014 - hep C RNA 08/2014 was negative - prior to that, HCV RNA [id number] in 06/2013. No record for genotype or imaging.   - patient counseled that she could become re-infected with continued IVDU or unprotected sexual encounters.   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   Last Assessment & Plan:  Discussed routine use of Valtrex prophylaxis at 36 weeks. She will be delivered by cesarean at 36-37 weeks.   . Infection with methicillin-resistant Staphylococcus aureus 03/19/2013   Overview: Arm abscess with MRSA, treated.  Subsequent screening negative  . IV drug abuse ([place])   . Tenosynovitis 08/19/2014    PAST SURGICAL HISTORY:   Past Surgical History:  Procedure Laterality Date  . CESAREAN SECTION    . CESAREAN SECTION N/A 05/18/2015   Procedure: CESAREAN SECTION;  Surgeon: Rubie Maid, MD;  Location: [facility] ORS;  Service: Obstetrics;  Laterality: N/A;  . NECK SURGERY     Fusion    SOCIAL HISTORY:   Social History   Tobacco Use  . Smoking status: Current Every Day Smoker    Packs/day: 1.00    Years: 7.00    Pack years: 7.00    Types: Cigarettes  . Smokeless tobacco: Never Used  . Tobacco comment: Patient refused  Substance Use Topics  . Alcohol use: Yes    Alcohol/week: 0.0 oz    Comment: bootlegger x2, patient states that she rarely drinks    FAMILY HISTORY:   Family History  Problem Relation Age of Onset  . Diabetes Mother   . COPD Mother   . Hypertension Mother   . Lung cancer Mother   . Alcohol abuse Father   . Lung cancer Maternal Grandmother  DRUG ALLERGIES:   Allergies  Allergen Reactions  . Amoxicillin Rash  . Latex   . Latex   . Vancomycin   . Vancomycin   . Amoxicillin Rash  . Clindamycin/Lincomycin Rash  . Clindamycin/Lincomycin Rash    REVIEW OF SYSTEMS:   Review of Systems  Unable to perform ROS: Acuity of condition    MEDICATIONS AT HOME:   Prior to Admission medications   Medication Sig Start Date End Date Taking? Authorizing Provider  amLODipine (NORVASC) 10 MG tablet Take 1 tablet (10 mg total) by mouth daily. 08/22/17   Pucilowska, Jolanta B, MD  amLODipine (NORVASC) 10 MG tablet Take 10 mg by mouth daily.     [provider]  carvedilol (COREG) 12.5 MG tablet Take 1 tablet (12.5 mg total) by mouth 2 (two) times daily with a meal. 08/22/17   Pucilowska, Jolanta B, MD  cefUROXime (CEFTIN) 500 MG tablet Take 1 tablet (500 mg total) by mouth 2 (two) times daily with a meal. X 1 week 08/22/17   Pucilowska, Jolanta B, MD  cyanocobalamin 1000 MCG tablet Take 1 tablet (1,000 mcg total) by mouth daily. 08/23/17   Pucilowska, Herma Ard B, MD  hydrOXYzine (ATARAX/VISTARIL) 50 MG tablet Take 1 tablet (50 mg total) by mouth 3 (three) times daily as needed for anxiety. 08/22/17   Pucilowska, Herma Ard B, MD  hydrOXYzine (ATARAX/VISTARIL) 50 MG tablet Take 50 mg by mouth 3 (three) times daily as needed for anxiety.    [provider]  naloxone [facility]) nasal spray 4 mg/0.1 mL Use as directed in overdose. 08/22/17   Pucilowska, Herma Ard B, MD  prazosin (MINIPRESS) 2 MG capsule Take 1 capsule (2 mg total) by mouth 2 (two) times daily. 08/22/17   Pucilowska, Jolanta B, MD  prazosin (MINIPRESS) 2 MG capsule Take 2 mg by mouth 2 (two) times daily.    [provider]  QUEtiapine (SEROQUEL) 300 MG tablet Take 2 tablets (600 mg total) by mouth at bedtime. 08/22/17   Pucilowska, Jolanta B, MD  QUEtiapine (SEROQUEL) 300 MG tablet Take 300 mg by mouth at bedtime. Medication bottle states, "take (2) tablets at bedtime"    [provider]  SUMAtriptan (IMITREX) 50 MG tablet Take 1 tablet (50 mg total) by mouth every 2 (two) hours as needed for migraine or headache. May repeat in 2 hours if headache persists or recurs. 08/22/17   Pucilowska, Stafford [place], MD      VITAL SIGNS:  Blood pressure 133/84, pulse (!) 104, temperature (!) 97.5 F (36.4 C), temperature source Oral, resp. rate 17, height 5' 7"  (1.702 m), weight 54.4 kg (120 lb), SpO2 97 %.  PHYSICAL EXAMINATION:   Physical Exam  Constitutional: She is oriented to person, place, and time and well-developed, well-nourished, and in no distress. No  distress.  Patient received Haldol and Ativan is now lethargic  HENT:  Head: Normocephalic.  Eyes: No scleral icterus.  Neck: Normal range of motion. Neck supple. No JVD present. No tracheal deviation present.  Cardiovascular: Normal rate, regular rhythm and normal heart sounds. Exam reveals no gallop and no friction rub.  No murmur heard. Pulmonary/Chest: Effort normal and breath sounds normal. No respiratory distress. She has no wheezes. She has no rales. She exhibits no tenderness.  Abdominal: Soft. Bowel sounds are normal. She exhibits no distension and no mass. There is no tenderness. There is no rebound and no guarding.  Musculoskeletal: She exhibits tenderness. She exhibits no edema.  Neurological: She is alert and oriented to  person, place, and time.  Skin: Skin is warm. No rash noted. There is erythema.  Right shoulder has some swelling and is very erythematous and warm to touch      LABORATORY PANEL:   CBC Recent Labs  Lab 12/15/17 1522  WBC 19.3*  HGB 10.0*  HCT 30.3*  PLT 197   ------------------------------------------------------------------------------------------------------------------  Chemistries  Recent Labs  Lab 12/15/17 1522  NA 133*  K 2.9*  CL 99*  CO2 20*  GLUCOSE 122*  BUN 49*  CREATININE 2.73*  CALCIUM 9.0  AST 45*  ALT 23  ALKPHOS 121  BILITOT 0.8   ------------------------------------------------------------------------------------------------------------------  Cardiac Enzymes No results for input(s): TROPONINI in the last 168 hours. ------------------------------------------------------------------------------------------------------------------  RADIOLOGY:  Dg Shoulder Right  Result Date: 12/15/2017 CLINICAL DATA:  Right shoulder pain post fall. EXAM: RIGHT SHOULDER - 2+ VIEW COMPARISON:  None. FINDINGS: There is no evidence of displaced fracture. Glenohumeral joint is located. There is 6.5 mm dissociation at the  acromioclavicular joint. The distal clavicle has somewhat indistinct appearance. Soft tissue overlies the distal clavicle. IMPRESSION: Widening of the acromioclavicular interval, which may represent ligamentous injury, or alternatively erosive changes or avulsion injury of the distal clavicle. Overlying soft tissue swelling. Electronically Signed   By: Fidela Salisbury M.D.   On: 12/15/2017 12:01   Ct Shoulder Right Wo Contrast  Result Date: 12/15/2017 CLINICAL DATA:  Questionable fall, painful shoulder overlying the AC joint. EXAM: CT OF THE UPPER RIGHT EXTREMITY WITHOUT CONTRAST TECHNIQUE: Multidetector CT imaging of the upper right extremity was performed according to the standard protocol. COMPARISON:  None. FINDINGS: Bones/Joint/Cartilage No acute fracture or dislocation. Normal alignment. No joint effusion. Glenohumeral joint is normal. No acromioclavicular joint widening. Partial osteolysis of the distal clavicle at the acromioclavicular joint. Subchondral cystic changes in the adjacent a chromium. Severe soft tissue swelling along the superior aspect of the acromioclavicular joint with soft tissue edema in the subcutaneous fat. Ligaments Ligaments are suboptimally evaluated by CT. Muscles and Tendons Muscles are normal. No muscle atrophy. No intramuscular fluid collection or hematoma. Soft tissue No fluid collection or hematoma. No soft tissue mass. Visualized right lung demonstrates a 10 mm pulmonary nodule in the right upper lobe of uncertain etiology. This may be secondary to an infectious or inflammatory etiology. IMPRESSION: 1. No acromioclavicular joint widening to suggest AC separation. 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 Findings are concerning for septic arthritis of  the acromioclavicular joint versus less likely posttraumatic osteolysis. If there is further clinical concern, evaluation with an MRI of right shoulder without and with intravenous contrast would be helpful in further characterization. 2. Visualized right lung demonstrates a 10 mm pulmonary nodule in the right upper lobe of uncertain etiology. This may be secondary to an infectious or inflammatory etiology. Recommend a follow-up CT of the chest without intravenous contrast in 3 months following the patient's acute illness. Electronically Signed   By: Kathreen Devoid   On: 12/15/2017 17:28    EKG:  none  IMPRESSION AND PLAN:   37 year old female with history of polysubstance abuse and apparently history of endocarditis 2 presented to the emergency room complaining of right shoulder pain.  1. Right shoulder pain with erythema concerning for septic joint with attempted aspiration by ED physician however unsuccessful Orthopedic surgery consulted and case was  discussed by ED physician to Dr. Harlow Mares. Continue meropenem ID consultation requested Order ESR and CRP Echocardiogram to evaluate for endocarditis given history of IV drug use Further recommendations after or throat and ID consult. Follow WBC  2. Polysubstance abuse: Patient will need to be admitted to stepdown due to withdrawal and agitation on Precedex Case discussed with intensivist Psychiatry consultation requested. Patient has IVC filter out by ER physician  Awaiting UDM  3. Hypokalemia: Replete and check an a.m. Check magnesium  4. Acute kidney injury: Start IV fluids Hold nephrotoxic medications Consider renal consultation if creatinine has not improved     All the records are reviewed and case discussed with ED provider.   CODE STATUS: FULL  Critical care TOTAL TIME TAKING CARE OF THIS PATIENT: 50 minutes.   Patient agitated and will be on Precedex drip and at increased risk for intubation.  Cheris Tweten M.D on  12/15/2017 at 6:10 PM  Between 7am to 6pm - Pager - [phone]  After 6pm go to [email or web address] - password [email or web address] [facility]  Office  [phone]  CC: Primary care physician; Patient, No Pcp Per

## 2017-12-15 NOTE — ED Notes (Signed)
Patient back in room from lobby getting snacks and outside smoking

## 2017-12-15 NOTE — ED Notes (Signed)
Patient is pacing room, mumbling words. Patient is talking to herself in room, stating she is seeing people

## 2017-12-16 ENCOUNTER — Inpatient Hospital Stay (HOSPITAL_COMMUNITY)
Admit: 2017-12-16 | Discharge: 2017-12-16 | Disposition: A | Discharge status: Home / Self Care | Payer: Self-pay | Attending: Internal Medicine | Admitting: Internal Medicine

## 2017-12-16 DIAGNOSIS — M00011 Staphylococcal arthritis, right shoulder: Secondary | ICD-10-CM

## 2017-12-16 DIAGNOSIS — F1910 Other psychoactive substance abuse, uncomplicated: Secondary | ICD-10-CM | POA: Diagnosis not present

## 2017-12-16 DIAGNOSIS — A419 Sepsis, unspecified organism: Secondary | ICD-10-CM

## 2017-12-16 DIAGNOSIS — A4101 Sepsis due to Methicillin susceptible Staphylococcus aureus: Principal | ICD-10-CM

## 2017-12-16 DIAGNOSIS — I351 Nonrheumatic aortic (valve) insufficiency: Secondary | ICD-10-CM

## 2017-12-16 LAB — BLOOD CULTURE ID PANEL (REFLEXED)
Acinetobacter baumannii: NOT DETECTED
CANDIDA GLABRATA: NOT DETECTED
CANDIDA TROPICALIS: NOT DETECTED
Candida albicans: NOT DETECTED
Candida krusei: NOT DETECTED
Candida parapsilosis: NOT DETECTED
Enterobacter cloacae complex: NOT DETECTED
Enterobacteriaceae species: NOT DETECTED
Enterococcus species: NOT DETECTED
Escherichia coli: NOT DETECTED
HAEMOPHILUS INFLUENZAE: NOT DETECTED
KLEBSIELLA PNEUMONIAE: NOT DETECTED
Klebsiella oxytoca: NOT DETECTED
Listeria monocytogenes: NOT DETECTED
METHICILLIN RESISTANCE: NOT DETECTED
NEISSERIA MENINGITIDIS: NOT DETECTED
PROTEUS SPECIES: NOT DETECTED
Pseudomonas aeruginosa: NOT DETECTED
SERRATIA MARCESCENS: NOT DETECTED
STAPHYLOCOCCUS AUREUS BCID: DETECTED — AB
STAPHYLOCOCCUS SPECIES: DETECTED — AB
STREPTOCOCCUS SPECIES: NOT DETECTED
Streptococcus agalactiae: NOT DETECTED
Streptococcus pneumoniae: NOT DETECTED
Streptococcus pyogenes: NOT DETECTED

## 2017-12-16 LAB — BLOOD GAS, ARTERIAL
ACID-BASE DEFICIT: 3 mmol/L — AB (ref 0.0–2.0)
BICARBONATE: 20.6 mmol/L (ref 20.0–28.0)
FIO2: 21
O2 Saturation: 96.3 %
PATIENT TEMPERATURE: 37
pCO2 arterial: 31 mmHg — ABNORMAL LOW (ref 32.0–48.0)
pH, Arterial: 7.43 (ref 7.350–7.450)
pO2, Arterial: 82 mmHg — ABNORMAL LOW (ref 83.0–108.0)

## 2017-12-16 LAB — CBC
HCT: 29.8 % — ABNORMAL LOW (ref 35.0–47.0)
HEMOGLOBIN: 9.9 g/dL — AB (ref 12.0–16.0)
MCH: 28.5 pg (ref 26.0–34.0)
MCHC: 33.2 g/dL (ref 32.0–36.0)
MCV: 86 fL (ref 80.0–100.0)
PLATELETS: 193 10*3/uL (ref 150–440)
RBC: 3.47 MIL/uL — AB (ref 3.80–5.20)
RDW: 15.7 % — ABNORMAL HIGH (ref 11.5–14.5)
WBC: 12.7 10*3/uL — AB (ref 3.6–11.0)

## 2017-12-16 LAB — BASIC METABOLIC PANEL
ANION GAP: 10 (ref 5–15)
BUN: 39 mg/dL — ABNORMAL HIGH (ref 6–20)
CALCIUM: 8.6 mg/dL — AB (ref 8.9–10.3)
CHLORIDE: 104 mmol/L (ref 101–111)
CO2: 19 mmol/L — ABNORMAL LOW (ref 22–32)
CREATININE: 1.81 mg/dL — AB (ref 0.44–1.00)
GFR calc Af Amer: 41 mL/min — ABNORMAL LOW (ref >60)
GFR calc non Af Amer: 35 mL/min — ABNORMAL LOW (ref >60)
Glucose, Bld: 139 mg/dL — ABNORMAL HIGH (ref 65–99)
Potassium: 2.6 mmol/L — CL (ref 3.5–5.1)
SODIUM: 133 mmol/L — AB (ref 135–145)

## 2017-12-16 LAB — C-REACTIVE PROTEIN: CRP: 27.4 mg/dL — ABNORMAL HIGH (ref <1.0)

## 2017-12-16 LAB — ECHOCARDIOGRAM COMPLETE
HEIGHTINCHES: 67 in
WEIGHTICAEL: 2010.6 [oz_av]

## 2017-12-16 LAB — MAGNESIUM: MAGNESIUM: 2.4 mg/dL (ref 1.7–2.4)

## 2017-12-16 LAB — LACTIC ACID, PLASMA: LACTIC ACID, VENOUS: 0.9 mmol/L (ref 0.5–1.9)

## 2017-12-16 LAB — PHOSPHORUS: Phosphorus: 2.8 mg/dL (ref 2.5–4.6)

## 2017-12-16 LAB — POTASSIUM: POTASSIUM: 3.6 mmol/L (ref 3.5–5.1)

## 2017-12-16 LAB — TROPONIN I

## 2017-12-16 MED ORDER — POTASSIUM CHLORIDE 10 MEQ/100ML IV SOLN
10.0000 meq | INTRAVENOUS | Status: AC
  Administered 2017-12-16 (×4): 10 meq via INTRAVENOUS
  Filled 2017-12-16 (×4): qty 100

## 2017-12-16 MED ORDER — HYDRALAZINE HCL 20 MG/ML IJ SOLN
10.0000 mg | Freq: Once | INTRAMUSCULAR | Status: AC
  Administered 2017-12-16: 10 mg via INTRAVENOUS
  Filled 2017-12-16: qty 1

## 2017-12-16 MED ORDER — DOCUSATE SODIUM 100 MG PO CAPS
100.0000 mg | ORAL_CAPSULE | Freq: Every day | ORAL | Status: DC
  Administered 2017-12-16 – 2017-12-27 (×5): 100 mg via ORAL
  Filled 2017-12-16 (×8): qty 1

## 2017-12-16 MED ORDER — POTASSIUM CHLORIDE 10 MEQ/100ML IV SOLN
10.0000 meq | INTRAVENOUS | Status: DC
  Administered 2017-12-16: 10 meq via INTRAVENOUS
  Filled 2017-12-16 (×4): qty 100

## 2017-12-16 MED ORDER — ENOXAPARIN SODIUM 40 MG/0.4ML ~~LOC~~ SOLN
40.0000 mg | SUBCUTANEOUS | Status: DC
  Administered 2017-12-16 – 2017-12-18 (×3): 40 mg via SUBCUTANEOUS
  Filled 2017-12-16 (×11): qty 0.4

## 2017-12-16 MED ORDER — MORPHINE SULFATE (PF) 2 MG/ML IV SOLN
2.0000 mg | Freq: Once | INTRAVENOUS | Status: AC
  Administered 2017-12-16: 2 mg via INTRAVENOUS

## 2017-12-16 MED ORDER — CEFAZOLIN SODIUM-DEXTROSE 2-4 GM/100ML-% IV SOLN
2.0000 g | Freq: Three times a day (TID) | INTRAVENOUS | Status: DC
  Administered 2017-12-16 – 2017-12-30 (×43): 2 g via INTRAVENOUS
  Filled 2017-12-16 (×46): qty 100

## 2017-12-16 MED ORDER — HYDRALAZINE HCL 20 MG/ML IJ SOLN
10.0000 mg | INTRAMUSCULAR | Status: DC | PRN
  Administered 2017-12-16 – 2017-12-17 (×2): 10 mg via INTRAVENOUS
  Filled 2017-12-16 (×2): qty 1

## 2017-12-16 MED ORDER — POTASSIUM CHLORIDE 20 MEQ PO PACK: 40.0000 meq | PACK | Freq: Once | ORAL | Status: DC

## 2017-12-16 MED ORDER — CLONIDINE HCL 0.2 MG/24HR TD PTWK
0.2000 mg | MEDICATED_PATCH | TRANSDERMAL | Status: DC
  Administered 2017-12-16 – 2017-12-30 (×3): 0.2 mg via TRANSDERMAL
  Filled 2017-12-16 (×4): qty 1

## 2017-12-16 MED ORDER — MORPHINE SULFATE (PF) 2 MG/ML IV SOLN
INTRAVENOUS | Status: AC
  Administered 2017-12-16: 2 mg via INTRAVENOUS
  Filled 2017-12-16: qty 1

## 2017-12-16 MED ORDER — POTASSIUM CHLORIDE 20 MEQ PO PACK
40.0000 meq | PACK | Freq: Once | ORAL | Status: AC
  Administered 2017-12-16: 40 meq via ORAL
  Filled 2017-12-16: qty 2

## 2017-12-16 NOTE — Progress Notes (Signed)
Spoke with staff RN re PICC to be done by [facility].
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
## 2017-12-16 NOTE — Care Management Note (Signed)
Case Management Note  Patient Details  Name: Samantha Houghiffany Marie Arias MRN: [id number] Date of Birth: 10/31/1981  Subjective/Objective:                 Admitted to icu stepdown due to need for Precedex for heroin and cocaine withdrawal.  No payor.  Concern for septic arthritis right shoulder.  ID consulting.  Recommending TEE. Anticipate need for PICC when blood cultures negative and long term IV antibiotics.  The need for long term IV antibiotics will be problematic.  Patient has no payor and is a current IV drug abuser.    Action/Plan:   Expected Discharge Date:                  Expected Discharge Plan:     In-House Referral:     Discharge planning Services     Post Acute Care Choice:    Choice offered to:     DME Arranged:    DME Agency:     HH Arranged:    HH Agency:     Status of Service:     If discussed at [employer] Length of Stay Meetings, dates discussed:    Additional Comments:  Eber HongGreene, Tawney Vanorman R, RN 12/16/2017, 2:35 PM

## 2017-12-16 NOTE — Progress Notes (Addendum)
PULMONARY / CRITICAL CARE MEDICINE   Name: [name] MRN: [id number] DOB: 07/30/1981    ADMISSION DATE:  12/15/2017   CONSULTATION DATE: 12/15/2017  REFERRING MD:  Dr. Juliene Pina  REASON: Septic arthritis of the right shoulder  HISTORY OF PRESENT ILLNESS:   This is a 37 year old female with a history as indicated below and current polysubstance abuse who presented with right shoulder pain.  History is obtained from ED records as patient is currently somnolent.  Per ED records, patient presented to the ED with complaints of right shoulder pain.  While in the ED patient became very agitated and was given Haldol and lorazepam.  She is currently somnolent but maintaining her airway.  ED workup was significant for septic arthritis of the right shoulder.  Toxicology was positive for amphetamines cocaine and opiates.  She is being admitted to the ICU for management of potential withdrawal from multiple substances as well as management of right shoulder septic arthritis. Patient currently has no IV access.  She has an I/O.  Awaiting PICC line placement   SUBJECTIVE: Remains somnolent but will follow basic commands. BP elevated; started on clonidine and hydralazine. Awaiting IV team to place PICC line  VITAL SIGNS: BP (!) 142/98   Pulse 92   Temp 99.4 F (37.4 C) (Axillary)   Resp 18   Ht 5\' 7"  (1.702 m)   Wt 57 kg (125 lb 10.6 oz)   SpO2 99%   BMI 19.68 kg/m   HEMODYNAMICS:    VENTILATOR SETTINGS:    INTAKE / OUTPUT: I/O last 3 completed shifts: In: 951.7 [I.V.:751.7; IV Piggyback:200] Out: 800 [Urine:800]  PHYSICAL EXAMINATION: General: Acutely ill looking Neuro: Awakens and withdraws to noxious stimulus, unable to follow commands HEENT: PERRLA, trachea midline Cardiovascular: Apical pulse regular, S1-S2, no murmur regurg or gallop, +2 pulses, no edema Lungs: Bilateral breath sounds without any wheezes or rhonchi Abdomen: Nondistended, normal bowel sounds Musculoskeletal:  Right shoulder swollen, erythematous, warm to touch, withdraws to any flexion and extension Skin: Warm and dry  LABS:  BMET Recent Labs  Lab 12/15/17 1522 12/16/17 0729  NA 133* 133*  K 2.9* 2.6*  CL 99* 104  CO2 20* 19*  BUN 49* 39*  CREATININE 2.73* 1.81*  GLUCOSE 122* 139*    Electrolytes Recent Labs  Lab 12/15/17 1522 12/16/17 0729  CALCIUM 9.0 8.6*  MG 2.6* 2.4  PHOS  --  2.8    CBC Recent Labs  Lab 12/15/17 1522 12/16/17 0729  WBC 19.3* 12.7*  HGB 10.0* 9.9*  HCT 30.3* 29.8*  PLT 197 193    Coag's No results for input(s): APTT, INR in the last 168 hours.  Sepsis Markers Recent Labs  Lab 12/15/17 1515 12/16/17 0729  LATICACIDVEN 1.5 0.9    ABG Recent Labs  Lab 12/16/17 0527  PHART 7.43  PCO2ART 31*  PO2ART 82*    Liver Enzymes Recent Labs  Lab 12/15/17 1522  AST 45*  ALT 23  ALKPHOS 121  BILITOT 0.8  ALBUMIN 3.7    Cardiac Enzymes Recent Labs  Lab 12/16/17 0729  TROPONINI <0.03    Glucose Recent Labs  Lab 12/15/17 2025  GLUCAP 104*    Imaging Dg Shoulder Right  Result Date: 12/15/2017 CLINICAL DATA:  Right shoulder pain post fall. EXAM: RIGHT SHOULDER - 2+ VIEW COMPARISON:  None. FINDINGS: There is no evidence of displaced fracture. Glenohumeral joint is located. There is 6.5 mm dissociation at the acromioclavicular joint. The distal clavicle has somewhat indistinct  appearance. Soft tissue overlies the distal clavicle. IMPRESSION: Widening of the acromioclavicular interval, which may represent ligamentous injury, or alternatively erosive changes or avulsion injury of the distal clavicle. Overlying soft tissue swelling. Electronically Signed   By: Ted Mcalpineobrinka  Dimitrova M.D.   On: 12/15/2017 12:01   Ct Shoulder Right Wo Contrast  Result Date: 12/15/2017 CLINICAL DATA:  Questionable fall, painful shoulder overlying the AC joint. EXAM: CT OF THE UPPER RIGHT EXTREMITY WITHOUT CONTRAST TECHNIQUE: Multidetector CT imaging of the  upper right extremity was performed according to the standard protocol. COMPARISON:  None. FINDINGS: Bones/Joint/Cartilage No acute fracture or dislocation. Normal alignment. No joint effusion. Glenohumeral joint is normal. No acromioclavicular joint widening. Partial osteolysis of the distal clavicle at the acromioclavicular joint. Subchondral cystic changes in the adjacent a chromium. Severe soft tissue swelling along the superior aspect of the acromioclavicular joint with soft tissue edema in the subcutaneous fat. Ligaments Ligaments are suboptimally evaluated by CT. Muscles and Tendons Muscles are normal. No muscle atrophy. No intramuscular fluid collection or hematoma. Soft tissue No fluid collection or hematoma. No soft tissue mass. Visualized right lung demonstrates a 10 mm pulmonary nodule in the right upper lobe of uncertain etiology. This may be secondary to an infectious or inflammatory etiology. IMPRESSION: 1. No acromioclavicular joint widening to suggest AC separation. 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 Findings are concerning for septic arthritis of the acromioclavicular joint versus less likely posttraumatic osteolysis. If there is further clinical concern, evaluation with an MRI of right shoulder without and with intravenous contrast would be helpful in further characterization. 2. Visualized right lung demonstrates a 10 mm pulmonary nodule in the right upper lobe of uncertain etiology. This may be secondary to an infectious or inflammatory etiology. Recommend a follow-up CT of the chest without intravenous contrast in 3 months following the patient's acute illness. Electronically Signed   By: Elige KoHetal  Patel   On: 12/15/2017 17:28   STUDIES:  None  CULTURES: Blood  cultures x2 MRSA screen negative ANTIBIOTICS: Meropenem  SIGNIFICANT EVENTS: 12/15/2017  LINES/TUBES: I/O Nedra HaiLee catheter  DISCUSSION: 37 year old female with a history of polysubstance abuse, IV drug use, now presenting with septic arthritis of the acromioclavicular joint and potential withdrawal while from multiple substances  ASSESSMENT Septic arthritis of the acromioclavicular joint MSSA bacteremia with right shoulder septic arthritis. Seen by ID Polysubstance abuse Acute metabolic encephalopathy secondary to polysubstance abuse and medications Hypokalemia Acute Kidney Injury from Sepsis- improving History of asthma h/o DVT History of HCV infection   PLAN Hemodynamics per ICU protocol Supplemental oxygen as needed ABX changed to Ancef. Will need TEE Follow-up cultures ID consulted; f/u recs Midline placed because of bacteremia.  Will need long term ABX CIWA protocol IV fluids Monitor and correct electrolytes Trend creatinine  GI and DVT prophylaxis Narcotic analgesia to avoid withdrawals Bowel regimen Diet  FAMILY  - Updates: will be updated when available   Jackson LatinoKarol Laine Fonner, MD Pulmonary and Critical Care Medicine [facility] Pager [phone] or [phone]   12/16/2017, 9:57 AM

## 2017-12-16 NOTE — Progress Notes (Signed)
Communicated potassium results, patients critical need for PICC/central line for blood draws IV use, [facility]. Orders to call Vascular Wellness team, no other orders at this time.

## 2017-12-16 NOTE — Progress Notes (Addendum)
Pharmacy consulted for electrolyte replacement protocol:   Goal of therapy: Electrolytes within normal limits:  K 3.5 - 5.1 Corrected Ca 8.9 - 10.3 Phos 2.5 - 4.6 Mg 1.7 - 2.4   Assessment: Lab Results  Component Value Date   CREATININE 1.81 (H) 12/16/2017   BUN 39 (H) 12/16/2017   NA 133 (L) 12/16/2017   K 2.6 (LL) 12/16/2017   CL 104 12/16/2017   CO2 19 (L) 12/16/2017    Plan: Ordered Potassium 10meq IV q1h x 4 doses. Was discontinued by provider. KCL 40meq PO given. Ordered another dose of KCL 40meq PO for today at noon.    Addendum:  PICC line placed. MD ordered KCL 40meq IV for today at 11:45. Discontinued 2nd PO order.  Check K level this evening at 22:00.   Will check BMP, Mg, and Phosphates with AM labs.   Stormy CardKatsoudas,Alizza Sacra K, [facility]PH Clinical Pharmacist 12/16/2017, 11:32 AM

## 2017-12-16 NOTE — Progress Notes (Signed)
Tukov NP notified of increasing blood pressures. Will look for new orders.

## 2017-12-16 NOTE — Progress Notes (Signed)
[place] [facility] [place] NAME: [name]    MR#:  [id number]  DATE OF BIRTH:  01-20-1981  SUBJECTIVE:  CHIEF COMPLAINT:   Chief Complaint  Patient presents with  . Shoulder Pain     History of drug abuse, came with right shoulder pain, noted to have septic shoulder joint.  In drug withdrawal, agitation and started on Precedex so sedated now.   REVIEW OF SYSTEMS:   Patient is sedated with Precedex.  ROS  DRUG ALLERGIES:   Allergies  Allergen Reactions  . Amoxicillin Rash  . Latex   . Latex   . Vancomycin   . Vancomycin   . Amoxicillin Rash  . Clindamycin/Lincomycin Rash  . Clindamycin/Lincomycin Rash    VITALS:  Blood pressure 129/87, pulse 83, temperature 99.4 F (37.4 C), temperature source Axillary, resp. rate (!) 22, height _0  (1.702 m), weight 57 kg (125 lb 10.6 oz), SpO2 99 %.  PHYSICAL EXAMINATION:  GENERAL:  37 y.o.-year-old patient lying in the bed with no acute distress.  EYES: Pupils equal, round, reactive to light and accommodation. No scleral icterus. Extraocular muscles intact.  HEENT: Head atraumatic, normocephalic. Oropharynx and nasopharynx clear.  NECK:  Supple, no jugular venous distention. No thyroid enlargement, no tenderness.  LUNGS: Normal breath sounds bilaterally, no wheezing, rales,rhonchi or crepitation. No use of accessory muscles of respiration.  CARDIOVASCULAR: S1, S2 normal. No murmurs, rubs, or gallops.  ABDOMEN: Soft, nontender, nondistended. Bowel sounds present. No organomegaly or mass.  EXTREMITIES: No pedal edema, cyanosis, or clubbing. Right shoulder tender. NEUROLOGIC: Cranial nerves II through XII are intact. Patient is sedated but she moves to painful stimuli, moves all 4 limbs.  PSYCHIATRIC: The patient is sedated with medication.  SKIN: No obvious rash, lesion, or ulcer.   Physical Exam LABORATORY PANEL:   CBC Recent Labs  Lab 12/16/17 0729  WBC 12.7*  HGB 9.9*  HCT  29.8*  PLT 193   ------------------------------------------------------------------------------------------------------------------  Chemistries  Recent Labs  Lab 12/15/17 1522 12/16/17 0729  NA 133* 133*  K 2.9* 2.6*  CL 99* 104  CO2 20* 19*  GLUCOSE 122* 139*  BUN 49* 39*  CREATININE 2.73* 1.81*  CALCIUM 9.0 8.6*  MG 2.6* 2.4  AST 45*  --   ALT 23  --   ALKPHOS 121  --   BILITOT 0.8  --    ------------------------------------------------------------------------------------------------------------------  Cardiac Enzymes Recent Labs  Lab 12/16/17 0729  TROPONINI <0.03   ------------------------------------------------------------------------------------------------------------------  RADIOLOGY:  Dg Shoulder Right  Result Date: 12/15/2017 CLINICAL DATA:  Right shoulder pain post fall. EXAM: RIGHT SHOULDER - 2+ VIEW COMPARISON:  None. FINDINGS: There is no evidence of displaced fracture. Glenohumeral joint is located. There is 6.5 mm dissociation at the acromioclavicular joint. The distal clavicle has somewhat indistinct appearance. Soft tissue overlies the distal clavicle. IMPRESSION: Widening of the acromioclavicular interval, which may represent ligamentous injury, or alternatively erosive changes or avulsion injury of the distal clavicle. Overlying soft tissue swelling. Electronically Signed   By: Fidela Salisbury M.D.   On: 12/15/2017 12:01   Ct Shoulder Right Wo Contrast  Result Date: 12/15/2017 CLINICAL DATA:  Questionable fall, painful shoulder overlying the AC joint. EXAM: CT OF THE UPPER RIGHT EXTREMITY WITHOUT CONTRAST TECHNIQUE: Multidetector CT imaging of the upper right extremity was performed according to the standard protocol. COMPARISON:  None. FINDINGS: Bones/Joint/Cartilage No acute fracture or dislocation. Normal alignment. No joint effusion. Glenohumeral joint is normal. No acromioclavicular joint widening.  Partial osteolysis of the distal clavicle at the  acromioclavicular joint. Subchondral cystic changes in the adjacent a chromium. Severe soft tissue swelling along the superior aspect of the acromioclavicular joint with soft tissue edema in the subcutaneous fat. Ligaments Ligaments are suboptimally evaluated by CT. Muscles and Tendons Muscles are normal. No muscle atrophy. No intramuscular fluid collection or hematoma. Soft tissue No fluid collection or hematoma. No soft tissue mass. Visualized right lung demonstrates a 10 mm pulmonary nodule in the right upper lobe of uncertain etiology. This may be secondary to an infectious or inflammatory etiology. IMPRESSION: 1. No acromioclavicular joint widening to suggest AC separation. 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 Findings are concerning for septic arthritis of the acromioclavicular joint versus less likely posttraumatic osteolysis. If there is further clinical concern, evaluation with an MRI of right shoulder without and with intravenous contrast would be helpful in further characterization. 2. Visualized right lung demonstrates a 10 mm pulmonary nodule in the right upper lobe of uncertain etiology. This may be secondary to an infectious or inflammatory etiology. Recommend a follow-up CT of the chest without intravenous contrast in 3 months following the patient's acute illness. Electronically Signed   By: Kathreen Devoid   On: 12/15/2017 17:28    ASSESSMENT AND PLAN:   Active Problems:   Septic joint ([place])  1. Right shoulder pain with erythema concerning for septic joint    Gram-positive bacteremia  attempted aspiration by ED physician however unsuccessful Orthopedic surgery consulted , currently no aspiration needed. ID consultation requested High ESR and CRP Echocardiogram  to evaluate for endocarditis given history of IV drug use Further recommendations after or throat and ID consult. Follow WBC  Cefazolin per ID consult for now.  2. Polysubstance abuse: admitted to stepdown due to withdrawal and agitation on Precedex Psychiatry consultation requested. Patient has IVC by ER physician  3. Hypokalemia: Replete and check an a.m. Check magnesium  4. Acute kidney injury: Start IV fluids Hold nephrotoxic medications Consider renal consultation if creatinine has not improved    All the records are reviewed and case discussed with Care Management/Social Workerr. Management plans discussed with the patient, family and they are in agreement.  CODE STATUS: Full.  TOTAL TIME TAKING CARE OF THIS PATIENT: 35 minutes.     POSSIBLE D/C IN 3-4 DAYS, DEPENDING ON CLINICAL CONDITION.   [name] M.D on 12/16/2017   Between 7am to 6pm - Pager - [phone]  After 6pm go to [email or web address] - password [email or web address] [facility]  Office  [phone]  CC: Primary care physician; Patient, No Pcp Per  Note: This dictation was prepared with Dragon dictation along with smaller phrase technology. Any transcriptional errors that result from this process are unintentional.

## 2017-12-16 NOTE — Progress Notes (Signed)
*  PRELIMINARY RESULTS* Echocardiogram 2D Echocardiogram has been performed.  [name] 12/16/2017, 8:38 AM

## 2017-12-16 NOTE — Progress Notes (Signed)
Pt with staph aureus bacteremia, suubs abuse, septic arthritis R shoulder.   Needs TEE (TTE pending) [facility] Will send repeat BCx.  Hold PIC line til her BCx are negative.  ID will formally see in AM       [place] Antimicrobial Management Team Staphylococcus aureus bacteremia   Staphylococcus aureus bacteremia (SAB) is associated with a high rate of complications and mortality.  Specific aspects of clinical management are critical to optimizing the outcome of patients with SAB.  Therefore, the [facility] has initiated an intervention aimed at improving the management of SAB [facility].  To do so, Infectious Diseases physicians are providing an evidence-based consult for the management of all patients with SAB.     Yes No Comments  Perform follow-up blood cultures (even if the patient is afebrile) to ensure clearance of bacteremia [x]  []    Remove vascular catheter and obtain follow-up blood cultures after the removal of the catheter []  []    Perform echocardiography to evaluate for endocarditis (transthoracic ECHO is 40-50% sensitive, TEE is > 90% sensitive) [x]  []  Please keep in mind, that neither test can definitively EXCLUDE endocarditis, and that should clinical suspicion remain high for endocarditis the patient should then still be treated with an "endocarditis" duration of therapy = 6 weeks  Consult electrophysiologist to evaluate implanted cardiac device (pacemaker, ICD) []  []    Ensure source control []  []  Have all abscesses been drained effectively? Have deep seeded infections (septic joints or osteomyelitis) had appropriate surgical debridement?  Investigate for "metastatic" sites of infection []  []  Does the patient have ANY symptom or physical exam finding that would suggest a deeper infection (back or neck pain that may be suggestive of vertebral osteomyelitis or epidural abscess, muscle pain that could be a  symptom of pyomyositis)?  Keep in mind that for deep seeded infections MRI imaging with contrast is preferred rather than other often insensitive tests such as plain x-rays, especially early in a patient's presentation.  Change antibiotic therapy to __________________ [x]  []  Beta-lactam antibiotics are preferred for MSSA due to higher cure rates.   If on Vancomycin, goal trough should be 15 - 20 mcg/mL  Estimated duration of IV antibiotic therapy:   []  []  Consult case management for probably prolonged outpatient IV antibiotic therapy

## 2017-12-16 NOTE — Consult Note (Signed)
PULMONARY / CRITICAL CARE MEDICINE   Name: [name] MRN: [id number] DOB: [birth year]    ADMISSION DATE:  12/15/2017   CONSULTATION DATE: 12/15/2017  REFERRING MD:  Dr. Juliene Pina  REASON: Septic arthritis of the right shoulder  HISTORY OF PRESENT ILLNESS:   This is a 37 year old female with a history as indicated below and current polysubstance abuse who presented with right shoulder pain.  History is obtained from ED records as patient is currently somnolent.  Per ED records, patient presented to the ED with complaints of right shoulder pain.  While in the ED patient became very agitated and was given Haldol and lorazepam.  She is currently somnolent but maintaining her airway.  ED workup was significant for septic arthritis of the right shoulder.  Toxicology was positive for amphetamines cocaine and opiates.  She is being admitted to the ICU for management of potential withdrawal from multiple substances as well as management of right shoulder septic arthritis. Patient currently has no IV access.  She has an I/O.  Awaiting PICC line placement  PAST MEDICAL HISTORY :  She  has a past medical history of Acute deep vein thrombosis of arm (HCC) (08/19/2014), Asthma, HCV (hepatitis C virus) (07/17/2013), Herpes infection in pregnancy (05/18/2013), Infection with methicillin-resistant Staphylococcus aureus (03/19/2013), IV drug abuse (HCC), and Tenosynovitis (08/19/2014).  PAST SURGICAL HISTORY: She  has a past surgical history that includes Cesarean section; Neck surgery; and Cesarean section (N/A, 05/18/2015).  Allergies  Allergen Reactions  . Amoxicillin Rash  . Latex   . Latex   . Vancomycin   . Vancomycin   . Amoxicillin Rash  . Clindamycin/Lincomycin Rash  . Clindamycin/Lincomycin Rash    No current facility-administered medications on file prior to encounter.    Current Outpatient Medications on File Prior to Encounter  Medication Sig  . amLODipine (NORVASC) 10 MG tablet Take 1 tablet  (10 mg total) by mouth daily. (Patient not taking: Reported on 12/15/2017)  . carvedilol (COREG) 12.5 MG tablet Take 1 tablet (12.5 mg total) by mouth 2 (two) times daily with a meal. (Patient not taking: Reported on 12/15/2017)  . cefUROXime (CEFTIN) 500 MG tablet Take 1 tablet (500 mg total) by mouth 2 (two) times daily with a meal. X 1 week (Patient not taking: Reported on 12/15/2017)  . cyanocobalamin 1000 MCG tablet Take 1 tablet (1,000 mcg total) by mouth daily. (Patient not taking: Reported on 12/15/2017)  . hydrOXYzine (ATARAX/VISTARIL) 50 MG tablet Take 1 tablet (50 mg total) by mouth 3 (three) times daily as needed for anxiety. (Patient not taking: Reported on 12/15/2017)  . naloxone (NARCAN) nasal spray 4 mg/0.1 mL Use as directed in overdose. (Patient not taking: Reported on 12/15/2017)  . prazosin (MINIPRESS) 2 MG capsule Take 1 capsule (2 mg total) by mouth 2 (two) times daily. (Patient not taking: Reported on 12/15/2017)  . QUEtiapine (SEROQUEL) 300 MG tablet Take 2 tablets (600 mg total) by mouth at bedtime. (Patient not taking: Reported on 12/15/2017)  . SUMAtriptan (IMITREX) 50 MG tablet Take 1 tablet (50 mg total) by mouth every 2 (two) hours as needed for migraine or headache. May repeat in 2 hours if headache persists or recurs. (Patient not taking: Reported on 12/15/2017)    FAMILY HISTORY:  Her indicated that her mother is alive. She indicated that her father is alive. She indicated that the status of her maternal grandmother is unknown.   SOCIAL HISTORY: She  reports that she has been smoking cigarettes.  She  has a 7.00 pack-year smoking history. she has never used smokeless tobacco. She reports that she drinks alcohol. She reports that she uses drugs. Drugs: Marijuana, Amphetamines, "Crack" cocaine, IV, Heroin, and Cocaine.  REVIEW OF SYSTEMS:   Unable to obtain as patient is currently somnolent  SUBJECTIVE:   VITAL SIGNS: BP 139/90 (BP Location: Left Arm)   Pulse 100   Temp 99.1 F  (37.3 C) (Axillary)   Resp 14   Ht 5\' 7"  (1.702 m)   Wt 57 kg (125 lb 10.6 oz)   SpO2 97%   BMI 19.68 kg/m   HEMODYNAMICS:    VENTILATOR SETTINGS:    INTAKE / OUTPUT: No intake/output data recorded.  PHYSICAL EXAMINATION: General: Acutely ill looking Neuro: Awakens and withdraws to noxious stimulus, unable to follow commands HEENT: PERRLA, trachea midline Cardiovascular: Apical pulse regular, S1-S2, no murmur regurg or gallop, +2 pulses, no edema Lungs: Bilateral breath sounds without any wheezes or rhonchi Abdomen: Nondistended, normal bowel sounds Musculoskeletal: Right shoulder swollen, erythematous, warm to touch, withdraws to any flexion and extension Skin: Warm and dry  LABS:  BMET Recent Labs  Lab 12/15/17 1522  NA 133*  K 2.9*  CL 99*  CO2 20*  BUN 49*  CREATININE 2.73*  GLUCOSE 122*    Electrolytes Recent Labs  Lab 12/15/17 1522  CALCIUM 9.0  MG 2.6*    CBC Recent Labs  Lab 12/15/17 1522  WBC 19.3*  HGB 10.0*  HCT 30.3*  PLT 197    Coag's No results for input(s): APTT, INR in the last 168 hours.  Sepsis Markers Recent Labs  Lab 12/15/17 1515  LATICACIDVEN 1.5    ABG No results for input(s): PHART, PCO2ART, PO2ART in the last 168 hours.  Liver Enzymes Recent Labs  Lab 12/15/17 1522  AST 45*  ALT 23  ALKPHOS 121  BILITOT 0.8  ALBUMIN 3.7    Cardiac Enzymes No results for input(s): TROPONINI, PROBNP in the last 168 hours.  Glucose Recent Labs  Lab 12/15/17 2025  GLUCAP 104*    Imaging Dg Shoulder Right  Result Date: 12/15/2017 CLINICAL DATA:  Right shoulder pain post fall. EXAM: RIGHT SHOULDER - 2+ VIEW COMPARISON:  None. FINDINGS: There is no evidence of displaced fracture. Glenohumeral joint is located. There is 6.5 mm dissociation at the acromioclavicular joint. The distal clavicle has somewhat indistinct appearance. Soft tissue overlies the distal clavicle. IMPRESSION: Widening of the acromioclavicular  interval, which may represent ligamentous injury, or alternatively erosive changes or avulsion injury of the distal clavicle. Overlying soft tissue swelling. Electronically Signed   By: Ted Mcalpineobrinka  Dimitrova M.D.   On: 12/15/2017 12:01   Ct Shoulder Right Wo Contrast  Result Date: 12/15/2017 CLINICAL DATA:  Questionable fall, painful shoulder overlying the AC joint. EXAM: CT OF THE UPPER RIGHT EXTREMITY WITHOUT CONTRAST TECHNIQUE: Multidetector CT imaging of the upper right extremity was performed according to the standard protocol. COMPARISON:  None. FINDINGS: Bones/Joint/Cartilage No acute fracture or dislocation. Normal alignment. No joint effusion. Glenohumeral joint is normal. No acromioclavicular joint widening. Partial osteolysis of the distal clavicle at the acromioclavicular joint. Subchondral cystic changes in the adjacent a chromium. Severe soft tissue swelling along the superior aspect of the acromioclavicular joint with soft tissue edema in the subcutaneous fat. Ligaments Ligaments are suboptimally evaluated by CT. Muscles and Tendons Muscles are normal. No muscle atrophy. No intramuscular fluid collection or hematoma. Soft tissue No fluid collection or hematoma. No soft tissue mass. Visualized right lung demonstrates a 10  mm pulmonary nodule in the right upper lobe of uncertain etiology. This may be secondary to an infectious or inflammatory etiology. IMPRESSION: 1. No acromioclavicular joint widening to suggest AC separation. 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 Findings are concerning for septic arthritis of the acromioclavicular joint versus less likely posttraumatic osteolysis. If there is further clinical concern, evaluation with an MRI of right shoulder without and with  intravenous contrast would be helpful in further characterization. 2. Visualized right lung demonstrates a 10 mm pulmonary nodule in the right upper lobe of uncertain etiology. This may be secondary to an infectious or inflammatory etiology. Recommend a follow-up CT of the chest without intravenous contrast in 3 months following the patient's acute illness. Electronically Signed   By: Elige Ko   On: 12/15/2017 17:28   STUDIES:  None  CULTURES: Blood cultures x2 MRSA screen negative ANTIBIOTICS: Meropenem  SIGNIFICANT EVENTS: 12/15/2017  LINES/TUBES: I/O Nedra Hai catheter  DISCUSSION: 37 year old female with a history of polysubstance abuse, IV drug use, now presenting with septic arthritis of the acromioclavicular joint and potential withdrawal while from multiple substances  ASSESSMENT Septic arthritis of the acromioclavicular joint Polysubstance abuse Acute metabolic encephalopathy secondary to polysubstance abuse and medications Hypokalemia Acute renal failure-creatinine trending up History of asthma Of DVT History of HCV infection   PLAN Hemodynamics per ICU protocol Supplemental oxygen as needed Antibiotics as above Follow-up cultures ID consulted Stat consult for PICC line placement CIWA protocol IV fluids Monitor and correct electrolytes Trend creatinine and consult nephrology if no improvement with morning labs GI and DVT prophylaxis  FAMILY  - Updates: No family at bedside will update when available  - Inter-disciplinary family meet or Palliative Care meeting due by:  day 7   Marveen Donlon S. [facility] ANP-BC Pulmonary and Critical Care Medicine [facility] Pager [phone] or [phone]  NB: This document was prepared using Dragon voice recognition software and may include unintentional dictation errors.    12/16/2017, 2:46 AM

## 2017-12-16 NOTE — Progress Notes (Signed)
Assumed care of patient from Erin RN around 18:10. Patient ate half of sandwich and fries for dinner and currently resting comfortably. Sister (listed in emergency contacts) set up password for patient and reported patient having CKD stage 3. Sister very concerned about patient's ability to finish treatment course and stated that patient "will say and do whatever she can to get out of here and then not follow up".

## 2017-12-16 NOTE — Consult Note (Signed)
ORTHOPAEDIC CONSULTATION  REQUESTING PHYSICIAN: Altamese DillingVachhani, Vaibhavkumar, *  Chief Complaint: shoulder pain  HPI: [name] is a 37 y.o. female with a history as indicated below and current polysubstance abuse who presented with right shoulder pain. The patient is currently somnolent and having an echocardiogram performed. History is obtained from ED records. Patient presented to the ED with complaints of right shoulder pain.  She is currently somnolent but maintaining her airway.  ED workup was significant for septic arthritis of the right shoulder.  Toxicology was positive for amphetamines cocaine and opiates.    Past Medical History:  Diagnosis Date  . Acute deep vein thrombosis of arm (HCC) 08/19/2014   Last Assessment & Plan:  - diagnosed 08/2014 in hospital - s/p heparin gtt - patient never took her xarelto x 3 month course that was planned - no symptoms today.  No further treatment - we discussed avoiding IVDU (see below) to reduce her chances of provoked upper extremity DVT   . Asthma   . HCV (hepatitis C virus) 07/17/2013   Last Assessment & Plan:  - positive antibody most recently 08/2014 - hep C RNA 08/2014 was negative - prior to that, HCV RNA [id number] in 06/2013. No record for genotype or imaging.   - patient counseled that she could become re-infected with continued IVDU or unprotected sexual encounters.   Marland Kitchen. 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   Last Assessment & Plan:  Discussed routine use of Valtrex prophylaxis at 36 weeks. She will be delivered by cesarean at 36-37 weeks.   . Infection with methicillin-resistant Staphylococcus aureus 03/19/2013   Overview: Arm abscess with MRSA, treated.  Subsequent screening negative  . IV drug abuse (HCC)   . Tenosynovitis 08/19/2014   Past Surgical History:  Procedure Laterality Date  . CESAREAN SECTION    . CESAREAN SECTION N/A 05/18/2015   Procedure: CESAREAN SECTION;   Surgeon: Hildred LaserAnika Cherry, MD;  Location: [facility] ORS;  Service: Obstetrics;  Laterality: N/A;  . NECK SURGERY     Fusion   Social History   Socioeconomic History  . Marital status: Unknown    Spouse name: None  . Number of children: None  . Years of education: None  . Highest education level: None  Social Needs  . Financial resource strain: None  . Food insecurity - worry: None  . Food insecurity - inability: None  . Transportation needs - medical: None  . Transportation needs - non-medical: None  Occupational History  . None  Tobacco Use  . Smoking status: Current Every Day Smoker    Packs/day: 1.00    Years: 7.00    Pack years: 7.00    Types: Cigarettes  . Smokeless tobacco: Never Used  . Tobacco comment: Patient refused  Substance and Sexual Activity  . Alcohol use: Yes    Alcohol/week: 0.0 oz    Comment: bootlegger x2, patient states that she rarely drinks  . Drug use: Yes    Types: Marijuana, Amphetamines, "Crack" cocaine, IV, Heroin, Cocaine    Comment: uses IV drugs, heroin  . Sexual activity: Yes    Birth control/protection: Injection    Comment: Patient stated that she is on birth control, injection. She could not remember the name of  it.  Other Topics Concern  . None  Social History Narrative   ** Merged History Encounter **       Lives at home with mother   Family History  Problem Relation Age of Onset  . Diabetes Mother   . COPD Mother   . Hypertension Mother   . Lung cancer Mother   . Alcohol abuse Father   . Lung cancer Maternal Grandmother    Allergies  Allergen Reactions  . Amoxicillin Rash  . Latex   . Latex   . Vancomycin   . Vancomycin   . Amoxicillin Rash  . Clindamycin/Lincomycin Rash  . Clindamycin/Lincomycin Rash   Prior to Admission medications   Medication Sig Start Date End Date Taking? Authorizing Provider  amLODipine (NORVASC) 10 MG tablet Take 1 tablet (10 mg total) by mouth daily. Patient not taking: Reported on 12/15/2017  08/22/17   Pucilowska, Braulio Conte B, MD  carvedilol (COREG) 12.5 MG tablet Take 1 tablet (12.5 mg total) by mouth 2 (two) times daily with a meal. Patient not taking: Reported on 12/15/2017 08/22/17   Pucilowska, Braulio Conte B, MD  cefUROXime (CEFTIN) 500 MG tablet Take 1 tablet (500 mg total) by mouth 2 (two) times daily with a meal. X 1 week Patient not taking: Reported on 12/15/2017 08/22/17   Pucilowska, Ellin Goodie, MD  cyanocobalamin 1000 MCG tablet Take 1 tablet (1,000 mcg total) by mouth daily. Patient not taking: Reported on 12/15/2017 08/23/17   Pucilowska, Braulio Conte B, MD  hydrOXYzine (ATARAX/VISTARIL) 50 MG tablet Take 1 tablet (50 mg total) by mouth 3 (three) times daily as needed for anxiety. Patient not taking: Reported on 12/15/2017 08/22/17   Pucilowska, Ellin Goodie, MD  naloxone [facility]) nasal spray 4 mg/0.1 mL Use as directed in overdose. Patient not taking: Reported on 12/15/2017 08/22/17   Pucilowska, Braulio Conte B, MD  prazosin (MINIPRESS) 2 MG capsule Take 1 capsule (2 mg total) by mouth 2 (two) times daily. Patient not taking: Reported on 12/15/2017 08/22/17   Pucilowska, Ellin Goodie, MD  QUEtiapine (SEROQUEL) 300 MG tablet Take 2 tablets (600 mg total) by mouth at bedtime. Patient not taking: Reported on 12/15/2017 08/22/17   Pucilowska, Ellin Goodie, MD  SUMAtriptan (IMITREX) 50 MG tablet Take 1 tablet (50 mg total) by mouth every 2 (two) hours as needed for migraine or headache. May repeat in 2 hours if headache persists or recurs. Patient not taking: Reported on 12/15/2017 08/22/17   Shari Prows, MD   Dg Shoulder Right  Result Date: 12/15/2017 CLINICAL DATA:  Right shoulder pain post fall. EXAM: RIGHT SHOULDER - 2+ VIEW COMPARISON:  None. FINDINGS: There is no evidence of displaced fracture. Glenohumeral joint is located. There is 6.5 mm dissociation at the acromioclavicular joint. The distal clavicle has somewhat indistinct appearance. Soft tissue overlies the distal clavicle. IMPRESSION:  Widening of the acromioclavicular interval, which may represent ligamentous injury, or alternatively erosive changes or avulsion injury of the distal clavicle. Overlying soft tissue swelling. Electronically Signed   By: Ted Mcalpine M.D.   On: 12/15/2017 12:01   Ct Shoulder Right Wo Contrast  Result Date: 12/15/2017 CLINICAL DATA:  Questionable fall, painful shoulder overlying the AC joint. EXAM: CT OF THE UPPER RIGHT EXTREMITY WITHOUT CONTRAST TECHNIQUE: Multidetector CT imaging of the upper right extremity was performed according to the standard protocol. COMPARISON:  None. FINDINGS: Bones/Joint/Cartilage No acute fracture or dislocation. Normal alignment. No joint effusion. Glenohumeral joint is normal. No acromioclavicular joint widening. Partial osteolysis of the distal clavicle at the acromioclavicular joint. Subchondral cystic changes in the adjacent a chromium. Severe soft tissue swelling along the superior aspect of the acromioclavicular joint with soft tissue edema in the subcutaneous fat. Ligaments Ligaments  are suboptimally evaluated by CT. Muscles and Tendons Muscles are normal. No muscle atrophy. No intramuscular fluid collection or hematoma. Soft tissue No fluid collection or hematoma. No soft tissue mass. Visualized right lung demonstrates a 10 mm pulmonary nodule in the right upper lobe of uncertain etiology. This may be secondary to an infectious or inflammatory etiology. IMPRESSION: 1. No acromioclavicular joint widening to suggest AC separation. 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 Findings are concerning for septic arthritis of the acromioclavicular joint versus less likely posttraumatic osteolysis. If there is further clinical concern, evaluation with an MRI  of right shoulder without and with intravenous contrast would be helpful in further characterization. 2. Visualized right lung demonstrates a 10 mm pulmonary nodule in the right upper lobe of uncertain etiology. This may be secondary to an infectious or inflammatory etiology. Recommend a follow-up CT of the chest without intravenous contrast in 3 months following the patient's acute illness. Electronically Signed   By: Elige Ko   On: 12/15/2017 17:28    Positive ROS: All other systems have been reviewed and were otherwise negative with the exception of those mentioned in the HPI and as above.  Physical Exam: General: somnolent, mildy distressed Cardiovascular: No pedal edema Respiratory: No cyanosis, no use of accessory musculature GI: No organomegaly, abdomen is soft and non-tender Skin: No lesions in the area of chief complaint, area of shoulder aspiration attempt is dry Neurologic: Sensation intact distally Psychiatric: Patient does not follow commands Lymphatic: No axillary or cervical lymphadenopathy  MUSCULOSKELETAL: Right shoulder with erythema along proximal shoulder girdle, moderate swelling over the [place] joint, +tenderness, +warmth  Assessment: Cellulitis of shoulder Problem List Items Addressed This Visit    None    Visit Diagnoses    Cellulitis of shoulder    -  Primary   Redness       Swelling of arm            Plan: The cellulitis appears to be somewhat improving. Likely also has MRSA septic arthritis of the acromioclavicular joint. There is currently no evidence of abscess. If there becomes evidence of a fluid collection and the patient is stable, she may benefit from irrigation and debridement of the distal clavicle. Will follow, please call with questions or concerns [phone].    Lyndle Herrlich, MD    12/16/2017 8:17 AM

## 2017-12-16 NOTE — Progress Notes (Signed)
ANTIBIOTIC CONSULT NOTE - INITIAL  Pharmacy Consult for cefazolin Indication: MSSA bacteremia  Allergies  Allergen Reactions  . Amoxicillin Rash  . Latex   . Latex   . Vancomycin   . Vancomycin   . Amoxicillin Rash  . Clindamycin/Lincomycin Rash  . Clindamycin/Lincomycin Rash    Patient Measurements: Height: 5\' 7"  (170.2 cm) Weight: 125 lb 10.6 oz (57 kg) IBW/kg (Calculated) : 61.6 Adjusted Body Weight:   Vital Signs: Temp: 99.4 F (37.4 C) (02/03 0800) Temp Source: Axillary (02/03 0800) BP: 145/96 (02/03 1000) Pulse Rate: 84 (02/03 1000) Intake/Output from previous day: 02/02 0701 - 02/03 0700 In: 951.7 [I.V.:751.7; IV Piggyback:200] Out: 800 [Urine:800] Intake/Output from this shift: Total I/O In: 555.1 [I.V.:540.1; IV Piggyback:15] Out: 75 [Urine:75]  Labs: Recent Labs    12/15/17 1522 12/16/17 0729  WBC 19.3* 12.7*  HGB 10.0* 9.9*  PLT 197 193  CREATININE 2.73* 1.81*   Estimated Creatinine Clearance: 39.4 mL/min (A) (by C-G formula based on SCr of 1.81 mg/dL (H)). No results for input(s): VANCOTROUGH, VANCOPEAK, VANCORANDOM, GENTTROUGH, GENTPEAK, GENTRANDOM, TOBRATROUGH, TOBRAPEAK, TOBRARND, AMIKACINPEAK, AMIKACINTROU, AMIKACIN in the last 72 hours.   Microbiology: Recent Results (from the past 720 hour(s))  Blood culture (single)     Status: None (Preliminary result)   Collection Time: 12/15/17  3:47 PM  Result Value Ref Range Status   Specimen Description BLOOD BLOOD LEFT FOREARM  Final   Special Requests   Final    BOTTLES DRAWN AEROBIC AND ANAEROBIC Blood Culture adequate volume   Culture  Setup Time   Final    Organism ID to follow IN BOTH AEROBIC AND ANAEROBIC BOTTLES GRAM POSITIVE COCCI CRITICAL RESULT CALLED TO, READ BACK BY AND VERIFIED WITH: HANK ZOMPA AT 0915 ON 12/16/17 BY SNJ Performed at [facility], [address] [place], [place] [address]    Culture GRAM POSITIVE COCCI  Final   Report Status PENDING  Incomplete  Blood  Culture ID Panel (Reflexed)     Status: Abnormal   Collection Time: 12/15/17  3:47 PM  Result Value Ref Range Status   Enterococcus species NOT DETECTED NOT DETECTED Final   Listeria monocytogenes NOT DETECTED NOT DETECTED Final   Staphylococcus species DETECTED (A) NOT DETECTED Final    Comment: CRITICAL RESULT CALLED TO, READ BACK BY AND VERIFIED WITH: HANK ZOMPA AT 0915 ON 12/16/17 BY SNJ    Staphylococcus aureus DETECTED (A) NOT DETECTED Final    Comment: Methicillin (oxacillin) susceptible Staphylococcus aureus (MSSA). Preferred therapy is anti staphylococcal beta lactam antibiotic (Cefazolin or Nafcillin), unless clinically contraindicated. CRITICAL RESULT CALLED TO, READ BACK BY AND VERIFIED WITH: HANK ZOMPA AT 0915 ON 12/16/17 BY SNJ    Methicillin resistance NOT DETECTED NOT DETECTED Final   Streptococcus species NOT DETECTED NOT DETECTED Final   Streptococcus agalactiae NOT DETECTED NOT DETECTED Final   Streptococcus pneumoniae NOT DETECTED NOT DETECTED Final   Streptococcus pyogenes NOT DETECTED NOT DETECTED Final   Acinetobacter baumannii NOT DETECTED NOT DETECTED Final   Enterobacteriaceae species NOT DETECTED NOT DETECTED Final   Enterobacter cloacae complex NOT DETECTED NOT DETECTED Final   Escherichia coli NOT DETECTED NOT DETECTED Final   Klebsiella oxytoca NOT DETECTED NOT DETECTED Final   Klebsiella pneumoniae NOT DETECTED NOT DETECTED Final   Proteus species NOT DETECTED NOT DETECTED Final   Serratia marcescens NOT DETECTED NOT DETECTED Final   Haemophilus influenzae NOT DETECTED NOT DETECTED Final   Neisseria meningitidis NOT DETECTED NOT DETECTED Final   Pseudomonas  aeruginosa NOT DETECTED NOT DETECTED Final   Candida albicans NOT DETECTED NOT DETECTED Final   Candida glabrata NOT DETECTED NOT DETECTED Final   Candida krusei NOT DETECTED NOT DETECTED Final   Candida parapsilosis NOT DETECTED NOT DETECTED Final   Candida tropicalis NOT DETECTED NOT DETECTED Final     Comment: Performed at [facility], [address] [place], [place] [address]  MRSA PCR Screening     Status: None   Collection Time: 12/15/17  8:27 PM  Result Value Ref Range Status   MRSA by PCR NEGATIVE NEGATIVE Final    Comment:        The GeneXpert MRSA Assay (FDA approved for NASAL specimens only), is one component of a comprehensive MRSA colonization surveillance program. It is not intended to diagnose MRSA infection nor to guide or monitor treatment for MRSA infections. Performed at [facility], [address], [place], [place] [address]     Medical History: Past Medical History:  Diagnosis Date  . Acute deep vein thrombosis of arm (HCC) 08/19/2014   Last Assessment & Plan:  - diagnosed 08/2014 in hospital - s/p heparin gtt - patient never took her xarelto x 3 month course that was planned - no symptoms today.  No further treatment - we discussed avoiding IVDU (see below) to reduce her chances of provoked upper extremity DVT   . Asthma   . HCV (hepatitis C virus) 07/17/2013   Last Assessment & Plan:  - positive antibody most recently 08/2014 - hep C RNA 08/2014 was negative - prior to that, HCV RNA [id number] in 06/2013. No record for genotype or imaging.   - patient counseled that she could become re-infected with continued IVDU or unprotected sexual encounters.   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   Last Assessment & Plan:  Discussed routine use of Valtrex prophylaxis at 36 weeks. She will be delivered by cesarean at 36-37 weeks.   . Infection with methicillin-resistant Staphylococcus aureus 03/19/2013   Overview: Arm abscess with MRSA, treated.  Subsequent screening negative  . IV drug abuse (HCC)   . Tenosynovitis 08/19/2014    Medications:  Infusions:  . sodium chloride 100 mL/hr at 12/16/17 0912  .  ceFAZolin (ANCEF) IV    . dexmedetomidine (PRECEDEX) IV infusion 0.4 mcg/kg/hr (12/16/17  0912)  . potassium chloride     Assessment: 36 yof with GPC in blood, staph species, MecA not detected. ID prescriber consults PTD Ancef.  Goal of Therapy:  Resolve infection Prevent ADE  Plan:  Cefazolin 2 gm IV Q8H. Note, patient has a history of rash to amoxicillin. Has tolerated meropenem inpatient. Please monitor for reaction.   Carola Frost, Pharm.D., BCPS Clinical Pharmacist 12/16/2017,11:49 AM

## 2017-12-16 NOTE — Progress Notes (Signed)
Anticoagulation monitoring(Lovenox):  36yo  female ordered Lovenox 30 mg Q24h  Filed Weights   12/15/17 1118 12/15/17 2033  Weight: 120 lb (54.4 kg) 125 lb 10.6 oz (57 kg)   BMI 19.7   Lab Results  Component Value Date   CREATININE 1.81 (H) 12/16/2017   CREATININE 2.73 (H) 12/15/2017   CREATININE 1.27 (H) 09/17/2017   Estimated Creatinine Clearance: 39.4 mL/min (A) (by C-G formula based on SCr of 1.81 mg/dL (H)). Hemoglobin & Hematocrit     Component Value Date/Time   HGB 9.9 (L) 12/16/2017 0729   HGB 8.0 (L) 05/13/2015 0926   HCT 29.8 (L) 12/16/2017 0729   HCT 24.6 (L) 05/13/2015 [id number]     Per Protocol for Patient with estCrcl > 30 ml/min and BMI < 40, will transition to Lovenox 40 mg Q24h.

## 2017-12-16 NOTE — Progress Notes (Signed)
PHARMACY - PHYSICIAN COMMUNICATION CRITICAL VALUE ALERT - BLOOD CULTURE IDENTIFICATION (BCID)  Mirai Marquis BuggyMarie Leventhal is an 37 y.o. female who [facility]2017 with a chief complaint of septic arthritis of the right shoulder.   Assessment:  Cellulitis of shoulder   Name of physician (or Provider) Contacted: ID consult  Current antibiotics: Meropenem 1g IV Q12H  Changes to prescribed antibiotics recommended:  Response not received from provider;  current antibiotics are likely to cover the isolated organism.  Consider de-escalation soon.  Results for orders placed or performed during the hospital encounter of 12/15/17  Blood Culture ID Panel (Reflexed) (Collected: 12/15/2017  3:47 PM)  Result Value Ref Range   Enterococcus species NOT DETECTED NOT DETECTED   Listeria monocytogenes NOT DETECTED NOT DETECTED   Staphylococcus species DETECTED (A) NOT DETECTED   Staphylococcus aureus DETECTED (A) NOT DETECTED   Methicillin resistance NOT DETECTED NOT DETECTED   Streptococcus species NOT DETECTED NOT DETECTED   Streptococcus agalactiae NOT DETECTED NOT DETECTED   Streptococcus pneumoniae NOT DETECTED NOT DETECTED   Streptococcus pyogenes NOT DETECTED NOT DETECTED   Acinetobacter baumannii NOT DETECTED NOT DETECTED   Enterobacteriaceae species NOT DETECTED NOT DETECTED   Enterobacter cloacae complex NOT DETECTED NOT DETECTED   Escherichia coli NOT DETECTED NOT DETECTED   Klebsiella oxytoca NOT DETECTED NOT DETECTED   Klebsiella pneumoniae NOT DETECTED NOT DETECTED   Proteus species NOT DETECTED NOT DETECTED   Serratia marcescens NOT DETECTED NOT DETECTED   Haemophilus influenzae NOT DETECTED NOT DETECTED   Neisseria meningitidis NOT DETECTED NOT DETECTED   Pseudomonas aeruginosa NOT DETECTED NOT DETECTED   Candida albicans NOT DETECTED NOT DETECTED   Candida glabrata NOT DETECTED NOT DETECTED   Candida krusei NOT DETECTED NOT DETECTED   Candida parapsilosis NOT  DETECTED NOT DETECTED   Candida tropicalis NOT DETECTED NOT DETECTED    Stormy CardKatsoudas,Dorman Calderwood K, [facility] 12/16/2017  11:27 AM

## 2017-12-16 NOTE — Progress Notes (Addendum)
Tukov NP informed of new swelling in bilateral hands. No new orders at this time. Will continue to monitor.

## 2017-12-17 ENCOUNTER — Other Ambulatory Visit: Payer: Self-pay

## 2017-12-17 ENCOUNTER — Inpatient Hospital Stay: Payer: Self-pay

## 2017-12-17 DIAGNOSIS — F4325 Adjustment disorder with mixed disturbance of emotions and conduct: Secondary | ICD-10-CM

## 2017-12-17 LAB — BASIC METABOLIC PANEL
Anion gap: 9 (ref 5–15)
BUN: 23 mg/dL — ABNORMAL HIGH (ref 6–20)
CALCIUM: 8.3 mg/dL — AB (ref 8.9–10.3)
CO2: 19 mmol/L — ABNORMAL LOW (ref 22–32)
CREATININE: 1.16 mg/dL — AB (ref 0.44–1.00)
Chloride: 106 mmol/L (ref 101–111)
GFR calc non Af Amer: >60 mL/min (ref >60)
Glucose, Bld: 108 mg/dL — ABNORMAL HIGH (ref 65–99)
Potassium: 3.6 mmol/L (ref 3.5–5.1)
SODIUM: 134 mmol/L — AB (ref 135–145)

## 2017-12-17 LAB — LACTIC ACID, PLASMA: Lactic Acid, Venous: 0.5 mmol/L (ref 0.5–1.9)

## 2017-12-17 LAB — CBC
HCT: 28.1 % — ABNORMAL LOW (ref 35.0–47.0)
Hemoglobin: 9.5 g/dL — ABNORMAL LOW (ref 12.0–16.0)
MCH: 28.9 pg (ref 26.0–34.0)
MCHC: 33.8 g/dL (ref 32.0–36.0)
MCV: 85.6 fL (ref 80.0–100.0)
PLATELETS: 214 10*3/uL (ref 150–440)
RBC: 3.29 MIL/uL — ABNORMAL LOW (ref 3.80–5.20)
RDW: 16.1 % — ABNORMAL HIGH (ref 11.5–14.5)
WBC: 12 10*3/uL — ABNORMAL HIGH (ref 3.6–11.0)

## 2017-12-17 LAB — MAGNESIUM: MAGNESIUM: 1.9 mg/dL (ref 1.7–2.4)

## 2017-12-17 LAB — PHOSPHORUS: PHOSPHORUS: 2.3 mg/dL — AB (ref 2.5–4.6)

## 2017-12-17 MED ORDER — POTASSIUM PHOSPHATE MONOBASIC 500 MG PO TABS
500.0000 mg | ORAL_TABLET | Freq: Three times a day (TID) | ORAL | Status: AC
  Administered 2017-12-17 (×2): 500 mg via ORAL
  Filled 2017-12-17 (×2): qty 1

## 2017-12-17 MED ORDER — HYDRALAZINE HCL 20 MG/ML IJ SOLN
10.0000 mg | INTRAMUSCULAR | Status: DC | PRN
  Administered 2017-12-17 – 2017-12-30 (×7): 10 mg via INTRAVENOUS
  Filled 2017-12-17 (×7): qty 1

## 2017-12-17 MED ORDER — LIDOCAINE HCL 1 % IJ SOLN
10.0000 mL | Freq: Once | INTRAMUSCULAR | Status: AC
  Administered 2017-12-17: 14:00:00 10 mL via INTRADERMAL
  Filled 2017-12-17: qty 10

## 2017-12-17 MED ORDER — POTASSIUM CHLORIDE 10 MEQ/100ML IV SOLN
10.0000 meq | INTRAVENOUS | Status: AC
  Administered 2017-12-17 (×4): 10 meq via INTRAVENOUS
  Filled 2017-12-17 (×4): qty 100

## 2017-12-17 NOTE — Consult Note (Signed)
[facility] Consult   Reason for Consult: Follow-up consult 37 year old woman with intravenous opiate dependence who has what could be a septic joint and is here for intravenous antibiotics. Referring Physician:  Anselm Jungling Patient Identification: [name] MRN:  [id number] Principal Diagnosis: <principal problem not specified> Diagnosis:   Patient Active Problem List   Diagnosis Date Noted  . Septic joint ([place]) [M00.9] 12/15/2017  . Cocaine overdose ([place]) [T40.5X1A] 09/15/2017  . Vitamin B12 deficiency [E53.8] 08/23/2017  . Migraine [G43.909] 08/22/2017  . Moderate mixed bipolar I disorder ([place]) [F31.62] 08/18/2017  . Bipolar I disorder, most recent episode depressed ([place]) [F31.30] 08/18/2017  . Opioid dependence with intoxication delirium ([place]) [F11.221]   . Cocaine abuse with cocaine-induced mood disorder ([place]) [F14.14]   . Drug overdose [T50.901A]   . HCAP (healthcare-associated pneumonia) [J18.9] 08/15/2017  . Acute respiratory failure with hypoxia ([place]) [J96.01] 08/15/2017  . Acute respiratory failure with hypoxia and hypercapnia (HCC) [J96.01, J96.02] 08/15/2017  . Acute renal failure ([place]) [N17.9]   . Altered mental status [R41.82] 07/24/2017  . Delirium, drug-induced ([place]) [I96.789] 07/24/2017  . Opiate abuse, continuous ([place]) [F11.10] 07/24/2017  . 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 Severe sepsis ([place]) [A41.9, R65.20] 07/16/2016  . AKI (acute kidney injury) ([place]) [N17.9] 07/16/2016  . Obtundation [R40.1] 07/16/2016  . Elevated lactic acid level [R79.89] 07/16/2016  . Drug abuse, IV ([place]) [F19.10] 07/16/2016  . Elevated LFTs [R94.5] 07/16/2016  . Sepsis ([place]) [A41.9] 07/10/2015  . Oligohydramnios [O41.00X0] 05/18/2015  . S/P cesarean section [Z98.891] 05/18/2015  . Preterm uterine contractions [O47.9] 05/17/2015  . Oligohydramnios antepartum [O41.00X0] 05/17/2015  . H/O cesarean section complicating pregnancy [F81.017] 05/17/2015   . Preterm contractions [O47.9] 05/17/2015  . AA (alcohol abuse) [F10.10] 12/08/2014  . Drug abuse during pregnancy [facility]) [P10.258, F19.10] 12/08/2014  . High risk sexual behavior [Z72.51] 12/08/2014  . Tenosynovitis [M65.9] 08/19/2014  . Acute deep vein thrombosis of arm (HCC) [I82.629] 08/19/2014  . HCV (hepatitis C virus) [B19.20] 07/17/2013  . H/O cesarean section [Z98.891] 05/18/2013  . Herpes infection in pregnancy [O98.519, B00.9] 05/18/2013  . Infection with methicillin-resistant Staphylococcus aureus [A49.02] 03/19/2013  . Cannabis abuse [F12.10] 03/18/2013  . Polysubstance abuse ([place]) [F19.10] 03/18/2013  . Compulsive tobacco user syndrome [F17.200] 03/17/2013    Total Time spent with patient: 1 hour  Subjective:   [name] is a 38 y.o. female patient admitted with "my arm was hurting".  HPI: 37 year old woman who is here in the hospital for arm pain that turns out to be a deep infection.  She is going to need intravenous antibiotics for a couple of weeks.  Patient was unresponsive in the ICU briefly but is now alert and oriented and has been moved to the floor.  Patient reports to me that she has been continuing to use intravenous drugs mostly heroin about 3 times a week.  Denies however having been depressed.  Denies any suicidal thought at all.  Denies any wish to die or to harm herself.  Denies any psychosis.  Currently says that the withdrawal symptoms she is having are minor and easily tolerated.  Patient does understand the diagnosis and the treatment plan.  Past Psychiatric History: Patient has a history of opiate dependence mood instability behavior problems.  Has been repeatedly noncompliant with recommended treatment of substance abuse.  Risk to Self: Is patient at risk for suicide?: No Risk to Others:   Prior Inpatient Therapy:   Prior Outpatient Therapy:    Past  Medical History:  Past Medical History:  Diagnosis Date  . Acute deep vein thrombosis  of arm (HCC) 08/19/2014   Last Assessment & Plan:  - diagnosed 08/2014 in hospital - s/p heparin gtt - patient never took her xarelto x 3 month course that was planned - no symptoms today.  No further treatment - we discussed avoiding IVDU (see below) to reduce her chances of provoked upper extremity DVT   . Asthma   . HCV (hepatitis C virus) 07/17/2013   Last Assessment & Plan:  - positive antibody most recently 08/2014 - hep C RNA 08/2014 was negative - prior to that, HCV RNA [id number] in 06/2013. No record for genotype or imaging.   - patient counseled that she could become re-infected with continued IVDU or unprotected sexual encounters.   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   Last Assessment & Plan:  Discussed routine use of Valtrex prophylaxis at 36 weeks. She will be delivered by cesarean at 36-37 weeks.   . Infection with methicillin-resistant Staphylococcus aureus 03/19/2013   Overview: Arm abscess with MRSA, treated.  Subsequent screening negative  . IV drug abuse ([place])   . Tenosynovitis 08/19/2014    Past Surgical History:  Procedure Laterality Date  . CESAREAN SECTION    . CESAREAN SECTION N/A 05/18/2015   Procedure: CESAREAN SECTION;  Surgeon: Rubie Maid, MD;  Location: [facility] ORS;  Service: Obstetrics;  Laterality: N/A;  . NECK SURGERY     Fusion   Family History:  Family History  Problem Relation Age of Onset  . Diabetes Mother   . COPD Mother   . Hypertension Mother   . Lung cancer Mother   . Alcohol abuse Father   . Lung cancer Maternal Grandmother    Family Psychiatric  History: None Social History:  Social History   Substance and Sexual Activity  Alcohol Use Yes  . Alcohol/week: 0.0 oz   Comment: bootlegger x2, patient states that she rarely drinks     Social History   Substance and Sexual Activity  Drug Use Yes  . Types: Marijuana, Amphetamines, "Crack" cocaine, IV, Heroin, Cocaine   Comment: uses IV  drugs, heroin    Social History   Socioeconomic History  . Marital status: Unknown    Spouse name: None  . Number of children: None  . Years of education: None  . Highest education level: None  Social Needs  . Financial resource strain: None  . Food insecurity - worry: None  . Food insecurity - inability: None  . Transportation needs - medical: None  . Transportation needs - non-medical: None  Occupational History  . None  Tobacco Use  . Smoking status: Current Every Day Smoker    Packs/day: 1.00    Years: 7.00    Pack years: 7.00    Types: Cigarettes  . Smokeless tobacco: Never Used  . Tobacco comment: Patient refused  Substance and Sexual Activity  . Alcohol use: Yes    Alcohol/week: 0.0 oz    Comment: bootlegger x2, patient states that she rarely drinks  . Drug use: Yes    Types: Marijuana, Amphetamines, "Crack" cocaine, IV, Heroin, Cocaine    Comment: uses IV drugs, heroin  . Sexual activity: Yes    Birth control/protection: Injection    Comment: Patient stated that she is on birth control, injection. She could not remember the name of  it.  Other Topics Concern  . None  Social History Narrative   ** Merged History Encounter **       Lives at home with mother   Additional Social History:    Allergies:   Allergies  Allergen Reactions  . Amoxicillin Rash  . Latex   . Latex   . Vancomycin   . Vancomycin   . Amoxicillin Rash  . Clindamycin/Lincomycin Rash  . Clindamycin/Lincomycin Rash    Labs:  Results for orders placed or performed during the hospital encounter of 12/15/17 (from the past 48 hour(s))  Blood gas, arterial     Status: Abnormal   Collection Time: 12/16/17  5:27 AM  Result Value Ref Range   FIO2 21.00    pH, Arterial 7.43 7.350 - 7.450   pCO2 arterial 31 (L) 32.0 - 48.0 mmHg   pO2, Arterial 82 (L) 83.0 - 108.0 mmHg   Bicarbonate 20.6 20.0 - 28.0 mmol/L   Acid-base deficit 3.0 (H) 0.0 - 2.0 mmol/L   O2 Saturation 96.3 %   Patient  temperature 37.0    Collection site RIGHT RADIAL    Sample type ARTERIAL DRAW    Allens test (pass/fail) PASS PASS    Comment: Performed at [facility], [address] [place], [place] [address]  Basic metabolic panel     Status: Abnormal   Collection Time: 12/16/17  7:29 AM  Result Value Ref Range   Sodium 133 (L) 135 - 145 mmol/L   Potassium 2.6 (LL) 3.5 - 5.1 mmol/L    Comment: CRITICAL RESULT CALLED TO, READ BACK BY AND VERIFIED WITH ERIN MAYNOR ON 12/16/17 AT 0805 QSD    Chloride 104 101 - 111 mmol/L   CO2 19 (L) 22 - 32 mmol/L   Glucose, Bld 139 (H) 65 - 99 mg/dL   BUN 39 (H) 6 - 20 mg/dL   Creatinine, Ser 1.81 (H) 0.44 - 1.00 mg/dL   Calcium 8.6 (L) 8.9 - 10.3 mg/dL   GFR calc non Af Amer 35 (L) >60 mL/min   GFR calc Af Amer 41 (L) >60 mL/min    Comment: (NOTE) The eGFR has been calculated using the CKD EPI equation. This calculation has not been validated in all clinical situations. eGFR's persistently <60 mL/min signify possible Chronic Kidney Disease.    Anion gap 10 5 - 15    Comment: Performed at [facility], [address] [place], [place] [address]  CBC     Status: Abnormal   Collection Time: 12/16/17  7:29 AM  Result Value Ref Range   WBC 12.7 (H) 3.6 - 11.0 K/uL   RBC 3.47 (L) 3.80 - 5.20 MIL/uL   Hemoglobin 9.9 (L) 12.0 - 16.0 g/dL   HCT 29.8 (L) 35.0 - 47.0 %   MCV 86.0 80.0 - 100.0 fL   MCH 28.5 26.0 - 34.0 pg   MCHC 33.2 32.0 - 36.0 g/dL   RDW 15.7 (H) 11.5 - 14.5 %   Platelets 193 150 - 440 K/uL    Comment: Performed at [facility], [address] [place], [place] [address]  Magnesium     Status: None   Collection Time: 12/16/17  7:29 AM  Result Value Ref Range   Magnesium 2.4 1.7 - 2.4 mg/dL    Comment: Performed at [facility], [address] [place], [place] [address]  Phosphorus     Status: None   Collection Time: 12/16/17  7:29 AM  Result Value Ref Range   Phosphorus 2.8 2.5 - 4.6 mg/dL  Comment: Performed at [facility], [address] [place], [place] [address]  Lactic acid, plasma     Status: None   Collection Time: 12/16/17  7:29 AM  Result Value Ref Range   Lactic Acid, Venous 0.9 0.5 - 1.9 mmol/L    Comment: Performed at [facility], [address] [place], [place] [address]  Troponin I     Status: None   Collection Time: 12/16/17  7:29 AM  Result Value Ref Range   Troponin I <0.03 <0.03 ng/mL    Comment: Performed at [facility], [address] [place], [place] [address]  C-reactive protein     Status: Abnormal   Collection Time: 12/16/17 12:11 PM  Result Value Ref Range   CRP 27.4 (H) <1.0 mg/dL    Comment: Performed at [facility], [address], [place], [place] [address]  Culture, blood (Routine X 2) w Reflex to ID Panel     Status: None (Preliminary result)   Collection Time: 12/16/17 12:11 PM  Result Value Ref Range   Specimen Description BLOOD RT HAND    Special Requests      BOTTLES DRAWN AEROBIC AND ANAEROBIC Blood Culture adequate volume   Culture      NO GROWTH < 24 HOURS Performed at [facility], [address], [place], [place] [address]    Report Status PENDING   Potassium     Status: None   Collection Time: 12/16/17  9:27 PM  Result Value Ref Range   Potassium 3.6 3.5 - 5.1 mmol/L    Comment: Performed at [facility], [address] [place], [place] [address]  Basic metabolic panel     Status: Abnormal   Collection Time: 12/17/17  5:28 AM  Result Value Ref Range   Sodium 134 (L) 135 - 145 mmol/L   Potassium 3.6 3.5 - 5.1 mmol/L   Chloride 106 101 - 111 mmol/L   CO2 19 (L) 22 - 32 mmol/L   Glucose, Bld 108 (H) 65 - 99 mg/dL   BUN 23 (H) 6 - 20 mg/dL   Creatinine, Ser 1.16 (H) 0.44 - 1.00 mg/dL   Calcium 8.3 (L) 8.9 - 10.3 mg/dL   GFR calc non Af Amer >60 >60 mL/min   GFR calc Af Amer >60 >60 mL/min    Comment: (NOTE) The eGFR has been calculated using the CKD EPI  equation. This calculation has not been validated in all clinical situations. eGFR's persistently <60 mL/min signify possible Chronic Kidney Disease.    Anion gap 9 5 - 15    Comment: Performed at [facility], [address] [place], [place] [address]  Phosphorus     Status: Abnormal   Collection Time: 12/17/17  5:28 AM  Result Value Ref Range   Phosphorus 2.3 (L) 2.5 - 4.6 mg/dL    Comment: Performed at [facility], [address] [place], [place] [address]  Magnesium     Status: None   Collection Time: 12/17/17  5:28 AM  Result Value Ref Range   Magnesium 1.9 1.7 - 2.4 mg/dL    Comment: Performed at [facility], [address] [place], [place] [address]  Lactic acid, plasma     Status: None   Collection Time: 12/17/17  5:28 AM  Result Value Ref Range   Lactic Acid, Venous 0.5 0.5 - 1.9 mmol/L    Comment: Performed at [facility], [address], [place],  [address]  CBC     Status: Abnormal   Collection Time: 12/17/17  5:28 AM  Result Value Ref Range   WBC 12.0 (H) 3.6 - 11.0 K/uL   RBC 3.29 (L) 3.80 - 5.20 MIL/uL   Hemoglobin 9.5 (L) 12.0 - 16.0 g/dL   HCT 28.1 (L) 35.0 - 47.0 %   MCV 85.6 80.0 - 100.0 fL   MCH 28.9 26.0 - 34.0 pg   MCHC 33.8 32.0 - 36.0 g/dL   RDW 16.1 (H) 11.5 - 14.5 %   Platelets 214 150 - 440 K/uL    Comment: Performed at [facility], [address] [place],  [address]    Current Facility-Administered Medications  Medication Dose Route Frequency Provider Last Rate Last Dose  . 0.9 %  sodium chloride infusion   Intravenous Continuous Bettey Costa, MD 100 mL/hr at 12/17/17 1125    . acetaminophen (TYLENOL) tablet 650 mg  650 mg Oral Q6H PRN Bettey Costa, MD       Or  . acetaminophen (TYLENOL) suppository 650 mg  650 mg Rectal Q6H PRN Mody, Sital, MD      . ceFAZolin (ANCEF) IVPB 2g/100 mL premix  2 g Intravenous Q8H [name], MD   Stopped at 12/17/17 1633  . cloNIDine  (CATAPRES - Dosed in mg/24 hr) patch 0.2 mg  0.2 mg Transdermal Weekly Tukov, Magadalene S, NP   0.2 mg at 12/16/17 0550  . dexmedetomidine (PRECEDEX) 400 MCG/100ML (4 mcg/mL) infusion  0.4-1.2 mcg/kg/hr Intravenous Titrated Bettey Costa, MD   Stopped at 12/16/17 1933  . docusate sodium (COLACE) capsule 100 mg  100 mg Oral Daily Lafayette Dragon, MD   100 mg at 12/17/17 1040  . enoxaparin (LOVENOX) injection 40 mg  40 mg Subcutaneous Q24H Mody, Sital, MD   40 mg at 12/16/17 2115  . hydrALAZINE (APRESOLINE) injection 10 mg  10 mg Intravenous Q2H PRN Dorene Sorrow S, NP   10 mg at 12/17/17 0757  . HYDROcodone-acetaminophen (NORCO/VICODIN) 5-325 MG per tablet 1-2 tablet  1-2 tablet Oral Q4H PRN Bettey Costa, MD   2 tablet at 12/17/17 1849  . HYDROmorphone (DILAUDID) injection 1 mg  1 mg Intravenous Q3H PRN Bettey Costa, MD   1 mg at 12/17/17 1423  . senna-docusate (Senokot-S) tablet 1 tablet  1 tablet Oral QHS PRN Bettey Costa, MD        Musculoskeletal: Strength & Muscle Tone: decreased Gait & Station: unsteady Patient leans: N/A  Psychiatric Specialty Exam: Physical Exam  Nursing note and vitals reviewed. Constitutional: She appears well-developed and well-nourished.  HENT:  Head: Normocephalic and atraumatic.  Eyes: Conjunctivae are normal. Pupils are equal, round, and reactive to light.  Neck: Normal range of motion.  Cardiovascular: Normal heart sounds.  Respiratory: Effort normal.  GI: Soft.  Musculoskeletal: Normal range of motion.       Arms: Neurological: She is alert.  Skin: Skin is warm and dry.  Psychiatric: Her affect is blunt. Her speech is delayed. She is slowed and withdrawn. Thought content is not paranoid. She expresses impulsivity. She expresses no homicidal and no suicidal ideation. She exhibits abnormal recent memory.    Review of Systems  Constitutional: Negative.   HENT: Negative.   Eyes: Negative.   Respiratory: Negative.   Cardiovascular: Negative.     Gastrointestinal: Negative.   Musculoskeletal: Positive for joint pain.  Skin: Negative.   Neurological: Negative.   Psychiatric/Behavioral: Positive for substance abuse. Negative for depression, hallucinations, memory loss and suicidal ideas.  The patient is not nervous/anxious and does not have insomnia.     Blood pressure (!) 165/94, pulse 95, temperature 99.4 F (37.4 C), temperature source Oral, resp. rate 18, height 5' 7"  (1.702 m), weight 57 kg (125 lb 10.6 oz), SpO2 97 %.Body mass index is 19.68 kg/m.  General Appearance: Disheveled  Eye Contact:  Fair  Speech:  Slow  Volume:  Decreased  Mood:  Dysphoric  Affect:  Constricted  Thought Process:  Goal Directed  Orientation:  Full (Time, Place, and Person)  Thought Content:  Logical  Suicidal Thoughts:  No  Homicidal Thoughts:  No  Memory:  Immediate;   Fair Recent;   Fair Remote;   Fair  Judgement:  Fair  Insight:  Fair  Psychomotor Activity:  Decreased  Concentration:  Concentration: Fair  Recall:  [employer] of Knowledge:  Fair  Language:  Fair  Akathisia:  No  Handed:  Right  AIMS (if indicated):     Assets:  Desire for Improvement Housing  ADL's:  Impaired  Cognition:  Impaired,  Mild  Sleep:        Treatment Plan Summary: Plan 37 year old woman with opiate dependence.  She is on IVC because of concerns about possible suicidality but the patient insists that she is not trying to kill herself and there is no evidence that she was suicidal.  Now that she is awake and alert is evident that she is lucid and is not delirious.  I spoke with the patient for a while about her opiate abuse.  She understands my concern that she may be tempted to walk out [place] if the commitment is discontinued.  Nevertheless she is not acutely suicidal.  I think it is reasonable to continue the commitment for now and to reassess for a day or so before making a decision about whether to stop it.  No clear indication for any  psychiatric medicine.  Patient does not feel its been helpful previously.  No change to current orders.  Disposition: No evidence of imminent risk to self or others at present.   Patient does not meet criteria for psychiatric inpatient admission. Supportive therapy provided about ongoing stressors.  Alethia Berthold, MD 12/17/2017 8:55 PM

## 2017-12-17 NOTE — Progress Notes (Signed)
Pharmacy Electrolyte Monitoring Consult:  Pharmacy consulted to assist in monitoring and replacing electrolytes in this 37 y.o. female admitted on 12/15/2017 with septic arthritis and MSSA bacteremia.   Labs:  Sodium (mmol/L)  Date Value  12/17/2017 134 (L)  12/14/2014 134 (L)   Potassium (mmol/L)  Date Value  12/17/2017 3.6  12/14/2014 3.6   Magnesium (mg/dL)  Date Value  40/98/119102/02/2018 1.9   Phosphorus (mg/dL)  Date Value  47/82/956202/02/2018 2.3 (L)   Calcium (mg/dL)  Date Value  13/08/657802/02/2018 8.3 (L)   Calcium, Total (mg/dL)  Date Value  46/96/295202/11/2014 8.6   Albumin (g/dL)  Date Value  84/13/244002/12/2017 3.7  12/14/2014 3.6    Plan: Will replace phos with Kphos neutral 500 mg orally x 2 doses and f/u AM labs.   Luisa HartChristy, Elvenia Godden D 12/17/2017 12:18 PM

## 2017-12-17 NOTE — Progress Notes (Signed)
Patient to be moved to room 114. [facility] Patient and sister (emergency contact) aware of transfer. Completed admission questions with patient but patient has some intermittent confusion so cannot be 100% certain of accuracy. Reviewed last admission's chart (Novembe 2018) where flu and pneumonia vaccine administered. Patient's foley removed without incident and patient voided and had stool into bedside commode this morning post foley removal. Patient still lethargic and sleeping between care, but cooperative when awakened. Dr. Odis LusterBowers to perform shoulder aspiration and consent signed both by patient and patient's sister via telephone. Patient appeared alert and oriented at the time of signing consent but with the intermittent confusion this RN made sure sister also gave consent for the procedure.

## 2017-12-17 NOTE — Progress Notes (Signed)
PULMONARY / CRITICAL CARE MEDICINE   Name: Samantha Houghiffany Marie Debow MRN: [id number] DOB: 02/23/1981    ADMISSION DATE:  12/15/2017   Synopsis: Patient with polysubstance abuse, septic arthritis, withdrawal symptoms, presently in the intensive care unit, on Precedex with as needed pain medication control  SUBJECTIVE: Patient presently on Precedex, awake and communicating   VITAL SIGNS: BP (!) 146/86   Pulse 91   Temp 99.5 F (37.5 C) (Oral)   Resp (!) 25   Ht 5\' 7"  (1.702 m)   Wt 125 lb 10.6 oz (57 kg)   SpO2 96%   BMI 19.68 kg/m   INTAKE / OUTPUT: I/O last 3 completed shifts: In: 4252.6 [P.O.:360; I.V.:3327.6; IV Piggyback:565] Out: 3525 [Urine:3525]  PHYSICAL EXAMINATION: General: Awake and alert, communicating, appears chronically ill complaining of right shoulder pain Neuro: He easily awakens, no focal deficits appreciated HEENT: PERRLA, trachea midline Cardiovascular: Apical pulse regular, S1-S2, no murmur regurg or gallop, +2 pulses, no edema Lungs: Bilateral breath sounds without any wheezes or rhonchi Abdomen: Nondistended, normal bowel sounds Musculoskeletal: Right shoulder swollen, erythematous, warm to touch, withdraws to any flexion and extension Skin: Warm and dry  LABS:  BMET Recent Labs  Lab 12/15/17 1522 12/16/17 0729 12/16/17 2127 12/17/17 0528  NA 133* 133*  --  134*  K 2.9* 2.6* 3.6 3.6  CL 99* 104  --  106  CO2 20* 19*  --  19*  BUN 49* 39*  --  23*  CREATININE 2.73* 1.81*  --  1.16*  GLUCOSE 122* 139*  --  108*    Electrolytes Recent Labs  Lab 12/15/17 1522 12/16/17 0729 12/17/17 0528  CALCIUM 9.0 8.6* 8.3*  MG 2.6* 2.4 1.9  PHOS  --  2.8 2.3*    CBC Recent Labs  Lab 12/15/17 1522 12/16/17 0729 12/17/17 0528  WBC 19.3* 12.7* 12.0*  HGB 10.0* 9.9* 9.5*  HCT 30.3* 29.8* 28.1*  PLT 197 193 214    Coag's No results for input(s): APTT, INR in the last 168 hours.  Sepsis Markers Recent Labs  Lab 12/15/17 1515 12/16/17 0729  12/17/17 0528  LATICACIDVEN 1.5 0.9 0.5    ABG Recent Labs  Lab 12/16/17 0527  PHART 7.43  PCO2ART 31*  PO2ART 82*    Liver Enzymes Recent Labs  Lab 12/15/17 1522  AST 45*  ALT 23  ALKPHOS 121  BILITOT 0.8  ALBUMIN 3.7    Cardiac Enzymes Recent Labs  Lab 12/16/17 0729  TROPONINI <0.03    Glucose Recent Labs  Lab 12/15/17 2025  GLUCAP 104*    Imaging Dg Chest Port 1 View  Result Date: 12/17/2017 CLINICAL DATA:  Cough EXAM: PORTABLE CHEST 1 VIEW COMPARISON:  08/17/2017 FINDINGS: Cardiac shadow is mildly prominent accentuated by the portable technique. Postsurgical changes in the lower cervical spine are noted. The lungs are well aerated bilaterally. No focal infiltrate or sizable effusion is seen. No acute bony abnormality is noted. IMPRESSION: No acute abnormality noted. Electronically Signed   By: Alcide CleverMark  Lukens M.D.   On: 12/17/2017 07:11    DISCUSSION: 37 year old female with a history of polysubstance abuse, IV drug use, now presenting with septic arthritis of the acromioclavicular joint and potential withdrawal while from multiple substances    ASSESSMENT Septic arthritis of the acromioclavicular joint MSSA bacteremia with right shoulder septic arthritis. Presently on cefazolin. And followed by infectious disease  Polysubstance abuse. Urine drug screen was positive for amphetamines, cocaine and opiates  Acute metabolic encephalopathy secondary to polysubstance abuse  and medications. Presently requiring Precedex  Hypokalemia. Potassium 3.6 this morning  Acute Kidney Injury from Sepsis- improving. BUN creatinine this morning was 23/1.6  History of HCV infection  Tora Kindred, DO  12/17/2017, 7:38 AM Patient ID: [name], female   DOB: 08-Sep-1981, 37 y.o.   MRN: [id number]

## 2017-12-17 NOTE — Progress Notes (Signed)
[facility] [place] NAME: [name]    MR#:  [id number]  DATE OF BIRTH:  03-Nov-1981  SUBJECTIVE:  CHIEF COMPLAINT:   Chief Complaint  Patient presents with  . Shoulder Pain     History of drug abuse, came with right shoulder pain, noted to have septic shoulder joint.  In drug withdrawal, agitation and started on Precedex , improved and off Precedex now, alert and oriented.   REVIEW OF SYSTEMS:     Review of Systems  Constitutional: Negative for fever, malaise/fatigue and weight loss.  HENT: Negative for congestion, ear discharge and hearing loss.   Eyes: Negative for blurred vision and redness.  Respiratory: Negative for cough, sputum production, shortness of breath, wheezing and stridor.   Cardiovascular: Negative for chest pain, palpitations and orthopnea.  Gastrointestinal: Negative for abdominal pain, diarrhea, heartburn and vomiting.  Genitourinary: Negative for dysuria and frequency.  Musculoskeletal: Positive for joint pain (right shoulder pain). Negative for falls.  Skin: Negative for rash.  Neurological: Negative for dizziness, tremors and focal weakness.  Psychiatric/Behavioral: Positive for substance abuse. Negative for depression and suicidal ideas.    DRUG ALLERGIES:   Allergies  Allergen Reactions  . Amoxicillin Rash  . Latex   . Latex   . Vancomycin   . Vancomycin   . Amoxicillin Rash  . Clindamycin/Lincomycin Rash  . Clindamycin/Lincomycin Rash    VITALS:  Blood pressure (!) 141/77, pulse 93, temperature 98.3 F (36.8 C), resp. rate 16, height 5' 7"  (1.702 m), weight 57 kg (125 lb 10.6 oz), SpO2 97 %.  PHYSICAL EXAMINATION:  GENERAL:  37 y.o.-year-old patient lying in the bed with no acute distress.  EYES: Pupils equal, round, reactive to light and accommodation. No scleral icterus. Extraocular muscles intact.  HEENT: Head atraumatic, normocephalic. Oropharynx and nasopharynx clear.  NECK:  Supple,  no jugular venous distention. No thyroid enlargement, no tenderness.  LUNGS: Normal breath sounds bilaterally, no wheezing, rales,rhonchi or crepitation. No use of accessory muscles of respiration.  CARDIOVASCULAR: S1, S2 normal. No murmurs, rubs, or gallops.  ABDOMEN: Soft, nontender, nondistended. Bowel sounds present. No organomegaly or mass.  EXTREMITIES: No pedal edema, cyanosis, or clubbing. Right shoulder tender. NEUROLOGIC: Cranial nerves II through XII are intact. No focal weakness, moves all 4 limbs, sensation intact. PSYCHIATRIC: The patient is alert and oriented 3.  SKIN: No obvious rash, lesion, or ulcer.   Physical Exam LABORATORY PANEL:   CBC Recent Labs  Lab 12/17/17 0528  WBC 12.0*  HGB 9.5*  HCT 28.1*  PLT 214   ------------------------------------------------------------------------------------------------------------------  Chemistries  Recent Labs  Lab 12/15/17 1522  12/17/17 0528  NA 133*   < > 134*  K 2.9*   < > 3.6  CL 99*   < > 106  CO2 20*   < > 19*  GLUCOSE 122*   < > 108*  BUN 49*   < > 23*  CREATININE 2.73*   < > 1.16*  CALCIUM 9.0   < > 8.3*  MG 2.6*   < > 1.9  AST 45*  --   --   ALT 23  --   --   ALKPHOS 121  --   --   BILITOT 0.8  --   --    < > = values in this interval not displayed.   ------------------------------------------------------------------------------------------------------------------  Cardiac Enzymes Recent Labs  Lab 12/16/17 0729  TROPONINI <0.03   ------------------------------------------------------------------------------------------------------------------  RADIOLOGY:  Ct Shoulder Right  Wo Contrast  Result Date: 12/15/2017 CLINICAL DATA:  Questionable fall, painful shoulder overlying the AC joint. EXAM: CT OF THE UPPER RIGHT EXTREMITY WITHOUT CONTRAST TECHNIQUE: Multidetector CT imaging of the upper right extremity was performed according to the standard protocol. COMPARISON:  None. FINDINGS:  Bones/Joint/Cartilage No acute fracture or dislocation. Normal alignment. No joint effusion. Glenohumeral joint is normal. No acromioclavicular joint widening. Partial osteolysis of the distal clavicle at the acromioclavicular joint. Subchondral cystic changes in the adjacent a chromium. Severe soft tissue swelling along the superior aspect of the acromioclavicular joint with soft tissue edema in the subcutaneous fat. Ligaments Ligaments are suboptimally evaluated by CT. Muscles and Tendons Muscles are normal. No muscle atrophy. No intramuscular fluid collection or hematoma. Soft tissue No fluid collection or hematoma. No soft tissue mass. Visualized right lung demonstrates a 10 mm pulmonary nodule in the right upper lobe of uncertain etiology. This may be secondary to an infectious or inflammatory etiology. IMPRESSION: 1. No acromioclavicular joint widening to suggest AC separation. 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 Findings are concerning for septic arthritis of the acromioclavicular joint versus less likely posttraumatic osteolysis. If there is further clinical concern, evaluation with an MRI of right shoulder without and with intravenous contrast would be helpful in further characterization. 2. Visualized right lung demonstrates a 10 mm pulmonary nodule in the right upper lobe of uncertain etiology. This may be secondary to an infectious or inflammatory etiology. Recommend a follow-up CT of the chest without intravenous contrast in 3 months following the patient's acute illness. Electronically Signed   By: Kathreen Devoid   On: 12/15/2017 17:28   Dg Chest Port 1 View  Result Date: 12/17/2017 CLINICAL DATA:  Cough EXAM: PORTABLE CHEST 1 VIEW COMPARISON:  08/17/2017 FINDINGS: Cardiac  shadow is mildly prominent accentuated by the portable technique. Postsurgical changes in the lower cervical spine are noted. The lungs are well aerated bilaterally. No focal infiltrate or sizable effusion is seen. No acute bony abnormality is noted. IMPRESSION: No acute abnormality noted. Electronically Signed   By: Inez Catalina M.D.   On: 12/17/2017 07:11    ASSESSMENT AND PLAN:   Active Problems:   Septic joint ([place])  1. Right shoulder pain with erythema concerning for septic joint    Gram-positive bacteremia- MSSA  attempted aspiration by ED physician however unsuccessful Orthopedic surgery consulted , plan for aspiration later today. ID consultation requested High ESR and CRP Echocardiogram to evaluate for endocarditis given history of IV drug use Further recommendations after ENT and ID consult. Follow WBC  Cefazolin per ID consult for now.  2. Polysubstance abuse: admitted to stepdown due to withdrawal and agitation on Precedex Psychiatry consultation requested. Patient has IVC by ER physician Off Precedex drip,Calm and comfortable now.  3. Hypokalemia: Replete and check an a.m. Check magnesium- normal.  4. Acute kidney injury: Start IV fluids Hold nephrotoxic medications Improved.   All the records are reviewed and case discussed with Care Management/Social Workerr. Management plans discussed with the patient, family and they are in agreement.  CODE STATUS: Full.  TOTAL TIME TAKING CARE OF THIS PATIENT: 35 minutes.    POSSIBLE D/C IN 3-4 DAYS, DEPENDING ON CLINICAL CONDITION.   [name] M.D on 12/17/2017   Between 7am to 6pm - Pager - [phone]  After  6pm go to [email or web address] - password [email or web address] [facility]  Office  [phone]  CC: Primary care physician; Patient, No Pcp Per  Note: This dictation was prepared with Dragon dictation along with smaller phrase technology. Any transcriptional errors that result from this  process are unintentional.

## 2017-12-17 NOTE — Progress Notes (Signed)
Pharmacy consulted for electrolyte replacement protocol:   Goal of therapy: Electrolytes within normal limits:  K 3.5 - 5.1; goal > 4.0 Corrected Ca 8.9 - 10.3 Phos 2.5 - 4.6 Mg 1.7 - 2.4   Assessment: Lab Results  Component Value Date   CREATININE 1.81 (H) 12/16/2017   BUN 39 (H) 12/16/2017   NA 133 (L) 12/16/2017   K 3.6 12/16/2017   CL 104 12/16/2017   CO2 19 (L) 12/16/2017    Plan: Ordered Potassium 10meq IV q1h x 4 doses. Was discontinued by provider. KCL 40meq PO given. Ordered another dose of KCL 40meq PO for today at noon.    Addendum:  PICC line placed. MD ordered KCL 40meq IV for today at 11:45. Discontinued 2nd PO order.  Check K level this evening at 22:00.   02/03 @ 2200 K 3.6 WNL, but will supplement w/ KCI 10 mEq IV x 4 to target a K of > 4.0 since patient is in ICU.  Will check BMP, Mg, and Phosphates with AM labs.   Thomasene Rippleavid  Nykeria Mealing, [facility] Orthopedic HospitalRPH Clinical Pharmacist 12/17/2017, 11:32 AM

## 2017-12-17 NOTE — Progress Notes (Signed)
Subjective:  Patient reports pain as moderate.  She is alert and oriented today. Right shoulder pain.   Objective:   VITALS:   Vitals:   12/17/17 0900 12/17/17 1000 12/17/17 1100 12/17/17 1224  BP: (!) 157/78  124/83 (!) 141/77  Pulse: (!) 122 (!) 104 98 93  Resp: (!) 27 (!) 21 (!) 24 16  Temp:    98.3 F (36.8 C)  TempSrc:      SpO2: 96% 96% 97%   Weight:      Height:        PHYSICAL EXAM:  Sensation intact distally decreased erythema and warmth, +swelling over [facility] joint and tenderness  LABS  Results for orders placed or performed during the hospital encounter of 12/15/17 (from the past 24 hour(s))  Potassium     Status: None   Collection Time: 12/16/17  9:27 PM  Result Value Ref Range   Potassium 3.6 3.5 - 5.1 mmol/L  Basic metabolic panel     Status: Abnormal   Collection Time: 12/17/17  5:28 AM  Result Value Ref Range   Sodium 134 (L) 135 - 145 mmol/L   Potassium 3.6 3.5 - 5.1 mmol/L   Chloride 106 101 - 111 mmol/L   CO2 19 (L) 22 - 32 mmol/L   Glucose, Bld 108 (H) 65 - 99 mg/dL   BUN 23 (H) 6 - 20 mg/dL   Creatinine, Ser 6.96 (H) 0.44 - 1.00 mg/dL   Calcium 8.3 (L) 8.9 - 10.3 mg/dL   GFR calc non Af Amer >60 >60 mL/min   GFR calc Af Amer >60 >60 mL/min   Anion gap 9 5 - 15  Phosphorus     Status: Abnormal   Collection Time: 12/17/17  5:28 AM  Result Value Ref Range   Phosphorus 2.3 (L) 2.5 - 4.6 mg/dL  Magnesium     Status: None   Collection Time: 12/17/17  5:28 AM  Result Value Ref Range   Magnesium 1.9 1.7 - 2.4 mg/dL  Lactic acid, plasma     Status: None   Collection Time: 12/17/17  5:28 AM  Result Value Ref Range   Lactic Acid, Venous 0.5 0.5 - 1.9 mmol/L  CBC     Status: Abnormal   Collection Time: 12/17/17  5:28 AM  Result Value Ref Range   WBC 12.0 (H) 3.6 - 11.0 K/uL   RBC 3.29 (L) 3.80 - 5.20 MIL/uL   Hemoglobin 9.5 (L) 12.0 - 16.0 g/dL   HCT 29.5 (L) 28.4 - 13.2 %   MCV 85.6 80.0 - 100.0 fL   MCH 28.9 26.0 - 34.0 pg   MCHC 33.8 32.0 -  36.0 g/dL   RDW 44.0 (H) 10.2 - 72.5 %   Platelets 214 150 - 440 K/uL    Ct Shoulder Right Wo Contrast  Result Date: 12/15/2017 CLINICAL DATA:  Questionable fall, painful shoulder overlying the AC joint. EXAM: CT OF THE UPPER RIGHT EXTREMITY WITHOUT CONTRAST TECHNIQUE: Multidetector CT imaging of the upper right extremity was performed according to the standard protocol. COMPARISON:  None. FINDINGS: Bones/Joint/Cartilage No acute fracture or dislocation. Normal alignment. No joint effusion. Glenohumeral joint is normal. No acromioclavicular joint widening. Partial osteolysis of the distal clavicle at the acromioclavicular joint. Subchondral cystic changes in the adjacent a chromium. Severe soft tissue swelling along the superior aspect of the acromioclavicular joint with soft tissue edema in the subcutaneous fat. Ligaments Ligaments are suboptimally evaluated by CT. Muscles and Tendons Muscles are normal. No muscle atrophy.  No intramuscular fluid collection or hematoma. Soft tissue No fluid collection or hematoma. No soft tissue mass. Visualized right lung demonstrates a 10 mm pulmonary nodule in the right upper lobe of uncertain etiology. This may be secondary to an infectious or inflammatory etiology. IMPRESSION: 1. No acromioclavicular joint widening to suggest AC separation. 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 Findings are concerning for septic arthritis of the acromioclavicular joint versus less likely posttraumatic osteolysis. If there is further clinical concern, evaluation with an MRI of right shoulder without and with intravenous contrast would be helpful in further characterization. 2. Visualized right lung demonstrates a 10 mm pulmonary nodule in the right upper lobe of  uncertain etiology. This may be secondary to an infectious or inflammatory etiology. Recommend a follow-up CT of the chest without intravenous contrast in 3 months following the patient's acute illness. Electronically Signed   By: Elige KoHetal  Patel   On: 12/15/2017 17:28   Dg Chest Port 1 View  Result Date: 12/17/2017 CLINICAL DATA:  Cough EXAM: PORTABLE CHEST 1 VIEW COMPARISON:  08/17/2017 FINDINGS: Cardiac shadow is mildly prominent accentuated by the portable technique. Postsurgical changes in the lower cervical spine are noted. The lungs are well aerated bilaterally. No focal infiltrate or sizable effusion is seen. No acute bony abnormality is noted. IMPRESSION: No acute abnormality noted. Electronically Signed   By: Alcide CleverMark  Lukens M.D.   On: 12/17/2017 07:11    Assessment/Plan:     Active Problems:   Septic joint (HCC)   Discussed treatment option regarding AC joint infection. She would like to proceed with an aspiration. Under aseptic conditions the right AC joint is aspirated using an 18 gauge needle.  1-2 mL of purulent material is obtained and sent for gram stain, culture and sensitivity. Will follow closely.   Lyndle HerrlichJames R Kimberli Winne , MD 12/17/2017, 2:27 PM

## 2017-12-17 NOTE — Consult Note (Signed)
[facility] Infectious Disease     Reason for Consult: Endocarditis    Referring Physician: Dolores Frame Date of Admission:  12/15/2017   Active Problems:   Septic joint [facility])   HPI: [name] is a 37 y.o. female admitted with R shoulder pain and agitation. She was found to have cellulitis of the shoulder and CT showed likely septic arthritis of AC joint. BCX + MSSA.  On admit had ARF with cr 2.7, CRp 27, ESR 65, wbc 19.  HIV neg, tox Screen + amphetamines, cocaine and opiates.  She has a history of HCV, possible previous endocarditis as well as IV drug use. She has had multiple admissions over the last year.  Today she had aspiration of purulent material from her R shoulder. She denies pain in other joints, back pain or fevers. Reports she has recently been injecting under her L arm but denies any boils or infection there.    Past Medical History:  Diagnosis Date  . Acute deep vein thrombosis of arm (HCC) 08/19/2014   Last Assessment & Plan:  - diagnosed 08/2014 in hospital - s/p heparin gtt - patient never took her xarelto x 3 month course that was planned - no symptoms today.  No further treatment - we discussed avoiding IVDU (see below) to reduce her chances of provoked upper extremity DVT   . Asthma   . HCV (hepatitis C virus) 07/17/2013   Last Assessment & Plan:  - positive antibody most recently 08/2014 - hep C RNA 08/2014 was negative - prior to that, HCV RNA [id number] in 06/2013. No record for genotype or imaging.   - patient counseled that she could become re-infected with continued IVDU or unprotected sexual encounters.   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   Last Assessment & Plan:  Discussed routine use of Valtrex prophylaxis at 36 weeks. She will be delivered by cesarean at 36-37 weeks.   . Infection with methicillin-resistant Staphylococcus aureus 03/19/2013   Overview: Arm abscess with MRSA, treated.  Subsequent  screening negative  . IV drug abuse ([place])   . Tenosynovitis 08/19/2014   Past Surgical History:  Procedure Laterality Date  . CESAREAN SECTION    . CESAREAN SECTION N/A 05/18/2015   Procedure: CESAREAN SECTION;  Surgeon: Rubie Maid, MD;  Location: [facility] ORS;  Service: Obstetrics;  Laterality: N/A;  . NECK SURGERY     Fusion   Social History   Tobacco Use  . Smoking status: Current Every Day Smoker    Packs/day: 1.00    Years: 7.00    Pack years: 7.00    Types: Cigarettes  . Smokeless tobacco: Never Used  . Tobacco comment: Patient refused  Substance Use Topics  . Alcohol use: Yes    Alcohol/week: 0.0 oz    Comment: bootlegger x2, patient states that she rarely drinks  . Drug use: Yes    Types: Marijuana, Amphetamines, "Crack" cocaine, IV, Heroin, Cocaine    Comment: uses IV drugs, heroin   Family History  Problem Relation Age of Onset  . Diabetes Mother   . COPD Mother   . Hypertension Mother   . Lung cancer Mother   . Alcohol abuse Father   . Lung cancer Maternal Grandmother     Allergies:  Allergies  Allergen Reactions  . Amoxicillin Rash  . Latex   . Latex   . Vancomycin   . Vancomycin   . Amoxicillin Rash  .  Clindamycin/Lincomycin Rash  . Clindamycin/Lincomycin Rash    Current antibiotics: Antibiotics Given (last 72 hours)    Date/Time Action Medication Dose Rate   12/15/17 1723 New Bag/Given   meropenem (MERREM) 1 g in sodium chloride 0.9 % 100 mL IVPB 1 g 200 mL/hr   12/16/17 0543 New Bag/Given   meropenem (MERREM) 1 g in sodium chloride 0.9 % 100 mL IVPB 1 g 200 mL/hr   12/16/17 1352 New Bag/Given   ceFAZolin (ANCEF) IVPB 2g/100 mL premix 2 g 200 mL/hr   12/16/17 2114 New Bag/Given   ceFAZolin (ANCEF) IVPB 2g/100 mL premix 2 g 200 mL/hr   12/17/17 0536 New Bag/Given   ceFAZolin (ANCEF) IVPB 2g/100 mL premix 2 g 200 mL/hr      MEDICATIONS: . cloNIDine  0.2 mg Transdermal Weekly  . docusate sodium  100 mg Oral Daily  . enoxaparin (LOVENOX)  injection  40 mg Subcutaneous Q24H  . lidocaine  10 mL Intradermal Once  . potassium phosphate (monobasic)  500 mg Oral TID WC    Review of Systems - 11 neg except per hpu  OBJECTIVE: Temp:  [98.3 F (36.8 C)-99.8 F (37.7 C)] 98.3 F (36.8 C) (02/04 1224) Pulse Rate:  [75-122] 93 (02/04 1224) Resp:  [16-30] 16 (02/04 1224) BP: (117-187)/(77-122) 141/77 (02/04 1224) SpO2:  [92 %-100 %] 97 % (02/04 1100) Physical Exam  Constitutional: disheveled, in some pain  HENT: [place]/AT, PERRLA, no scleral icterus periorbital ecchymoses  Mouth/Throat: Oropharynx is clear and dry.   Cardiovascular: tachy, 2/6 sm  Pulmonary/Chest: Effort normal and breath sounds normal. No respiratory distress.  has no wheezes.  Neck = supple, no nuchal rigidity Abdominal: Soft. Bowel sounds are normal.  exhibits no distension. There is no tenderness.  Lymphadenopathy: no cervical adenopathy. No axillary adenopathy Neurological: alert interactive.  Ext - R shoulder with limited rom due to pain, mod erythema surrounding it, receding from line draw previously Skin: no rash. Psychiatric: alert, flat affect  LABS: Results for orders placed or performed during the hospital encounter of 12/15/17 (from the past 48 hour(s))  Lactic acid, plasma     Status: None   Collection Time: 12/15/17  3:15 PM  Result Value Ref Range   Lactic Acid, Venous 1.5 0.5 - 1.9 mmol/L    Comment: Performed at [facility], [address] [place], [place] [address]  CBC with Differential     Status: Abnormal   Collection Time: 12/15/17  3:22 PM  Result Value Ref Range   WBC 19.3 (H) 3.6 - 11.0 K/uL   RBC 3.49 (L) 3.80 - 5.20 MIL/uL   Hemoglobin 10.0 (L) 12.0 - 16.0 g/dL   HCT 30.3 (L) 35.0 - 47.0 %   MCV 86.7 80.0 - 100.0 fL   MCH 28.5 26.0 - 34.0 pg   MCHC 32.9 32.0 - 36.0 g/dL   RDW 16.0 (H) 11.5 - 14.5 %   Platelets 197 150 - 440 K/uL   Neutrophils Relative % 89 %   Neutro Abs 17.1 (H) 1.4 - 6.5 K/uL   Lymphocytes  Relative 6 %   Lymphs Abs 1.2 1.0 - 3.6 K/uL   Monocytes Relative 5 %   Monocytes Absolute 0.9 0.2 - 0.9 K/uL   Eosinophils Relative 0 %   Eosinophils Absolute 0.0 0 - 0.7 K/uL   Basophils Relative 0 %   Basophils Absolute 0.1 0 - 0.1 K/uL    Comment: Performed at [facility], [address] [place], [place] [address]  Comprehensive metabolic panel     Status: Abnormal   Collection Time: 12/15/17  3:22 PM  Result Value Ref Range   Sodium 133 (L) 135 - 145 mmol/L   Potassium 2.9 (L) 3.5 - 5.1 mmol/L   Chloride 99 (L) 101 - 111 mmol/L   CO2 20 (L) 22 - 32 mmol/L   Glucose, Bld 122 (H) 65 - 99 mg/dL   BUN 49 (H) 6 - 20 mg/dL   Creatinine, Ser 2.73 (H) 0.44 - 1.00 mg/dL   Calcium 9.0 8.9 - 10.3 mg/dL   Total Protein 7.9 6.5 - 8.1 g/dL   Albumin 3.7 3.5 - 5.0 g/dL   AST 45 (H) 15 - 41 U/L   ALT 23 14 - 54 U/L   Alkaline Phosphatase 121 38 - 126 U/L   Total Bilirubin 0.8 0.3 - 1.2 mg/dL   GFR calc non Af Amer 22 (L) >60 mL/min   GFR calc Af Amer 25 (L) >60 mL/min    Comment: (NOTE) The eGFR has been calculated using the CKD EPI equation. This calculation has not been validated in all clinical situations. eGFR's persistently <60 mL/min signify possible Chronic Kidney Disease.    Anion gap 14 5 - 15    Comment: Performed at [facility], [address] [place], [place] [address]  Sedimentation rate     Status: Abnormal   Collection Time: 12/15/17  3:22 PM  Result Value Ref Range   Sed Rate 65 (H) 0 - 20 mm/hr    Comment: Performed at [facility], [place], [place] [address]  Rapid HIV screen (HIV 1/2 Ab+Ag)     Status: None   Collection Time: 12/15/17  3:22 PM  Result Value Ref Range   HIV-1 P24 Antigen - HIV24 NON REACTIVE NON REACTIVE   HIV 1/2 Antibodies NON REACTIVE NON REACTIVE   Interpretation (HIV Ag Ab)      A non reactive test result means that HIV 1 or HIV 2 antibodies and HIV 1 p24 antigen were not detected in the  specimen.    Comment: Performed at [facility], [address] [place], [place] [address]  Magnesium     Status: Abnormal   Collection Time: 12/15/17  3:22 PM  Result Value Ref Range   Magnesium 2.6 (H) 1.7 - 2.4 mg/dL    Comment: Performed at [facility], [address] [place], [place] [address]  Blood culture (single)     Status: Abnormal (Preliminary result)   Collection Time: 12/15/17  3:47 PM  Result Value Ref Range   Specimen Description      BLOOD LEFT FOREARM Performed at [facility], [place] [address], [place], [place] [address]    Special Requests      BOTTLES DRAWN AEROBIC AND ANAEROBIC Blood Culture adequate volume Performed at [place], [place] [address]    Culture  Setup Time      IN BOTH AEROBIC AND ANAEROBIC BOTTLES GRAM POSITIVE COCCI CRITICAL RESULT CALLED TO, READ BACK BY AND VERIFIED WITH: HANK ZOMPA AT 0915 ON 12/16/17 BY SNJ    Culture (A)     STAPHYLOCOCCUS AUREUS SUSCEPTIBILITIES TO FOLLOW Performed at [facility], [place] [address], [place], [place] [address]    Report Status PENDING   Blood Culture ID Panel (Reflexed)     Status: Abnormal   Collection Time: 12/15/17  3:47 PM  Result Value Ref Range  Enterococcus species NOT DETECTED NOT DETECTED   Listeria monocytogenes NOT DETECTED NOT DETECTED   Staphylococcus species DETECTED (A) NOT DETECTED    Comment: CRITICAL RESULT CALLED TO, READ BACK BY AND VERIFIED WITH: HANK ZOMPA AT 0915 ON 12/16/17 BY SNJ    Staphylococcus aureus DETECTED (A) NOT DETECTED    Comment: Methicillin (oxacillin) susceptible Staphylococcus aureus (MSSA). Preferred therapy is anti staphylococcal beta lactam antibiotic (Cefazolin or Nafcillin), unless clinically contraindicated. CRITICAL RESULT CALLED TO, READ BACK BY AND VERIFIED WITH: HANK ZOMPA AT 0915 ON 12/16/17 BY SNJ    Methicillin resistance NOT DETECTED NOT DETECTED   Streptococcus species NOT  DETECTED NOT DETECTED   Streptococcus agalactiae NOT DETECTED NOT DETECTED   Streptococcus pneumoniae NOT DETECTED NOT DETECTED   Streptococcus pyogenes NOT DETECTED NOT DETECTED   Acinetobacter baumannii NOT DETECTED NOT DETECTED   Enterobacteriaceae species NOT DETECTED NOT DETECTED   Enterobacter cloacae complex NOT DETECTED NOT DETECTED   Escherichia coli NOT DETECTED NOT DETECTED   Klebsiella oxytoca NOT DETECTED NOT DETECTED   Klebsiella pneumoniae NOT DETECTED NOT DETECTED   Proteus species NOT DETECTED NOT DETECTED   Serratia marcescens NOT DETECTED NOT DETECTED   Haemophilus influenzae NOT DETECTED NOT DETECTED   Neisseria meningitidis NOT DETECTED NOT DETECTED   Pseudomonas aeruginosa NOT DETECTED NOT DETECTED   Candida albicans NOT DETECTED NOT DETECTED   Candida glabrata NOT DETECTED NOT DETECTED   Candida krusei NOT DETECTED NOT DETECTED   Candida parapsilosis NOT DETECTED NOT DETECTED   Candida tropicalis NOT DETECTED NOT DETECTED    Comment: Performed at [facility], [address] [place], [place] [address]  Urine Drug Screen, Qualitative     Status: Abnormal   Collection Time: 12/15/17  6:04 PM  Result Value Ref Range   Tricyclic, Ur Screen NONE DETECTED NONE DETECTED   Amphetamines, Ur Screen POSITIVE (A) NONE DETECTED   MDMA (Ecstasy)Ur Screen NONE DETECTED NONE DETECTED   Cocaine Metabolite,Ur [place] POSITIVE (A) NONE DETECTED   Opiate, Ur Screen POSITIVE (A) NONE DETECTED   Phencyclidine (PCP) Ur S NONE DETECTED NONE DETECTED   Cannabinoid 50 Ng, Ur [place] NONE DETECTED NONE DETECTED   Barbiturates, Ur Screen NONE DETECTED NONE DETECTED   Benzodiazepine, Ur Scrn NONE DETECTED NONE DETECTED   Methadone Scn, Ur NONE DETECTED NONE DETECTED    Comment: (NOTE) Tricyclics + metabolites, urine    Cutoff 1000 ng/mL Amphetamines + metabolites, urine  Cutoff 1000 ng/mL MDMA (Ecstasy), urine              Cutoff 500 ng/mL Cocaine Metabolite, urine          Cutoff  300 ng/mL Opiate + metabolites, urine        Cutoff 300 ng/mL Phencyclidine (PCP), urine         Cutoff 25 ng/mL Cannabinoid, urine                 Cutoff 50 ng/mL Barbiturates + metabolites, urine  Cutoff 200 ng/mL Benzodiazepine, urine              Cutoff 200 ng/mL Methadone, urine                   Cutoff 300 ng/mL The urine drug screen provides only a preliminary, unconfirmed analytical test result and should not be used for non-medical purposes. Clinical consideration and professional judgment should be applied to any positive drug screen result due to possible interfering substances. A more specific alternate chemical method  must be used in order to obtain a confirmed analytical result. Gas chromatography / mass spectrometry (GC/MS) is the preferred confirmat ory method. Performed at [facility], [address] [place], [place] [address]   Glucose, capillary     Status: Abnormal   Collection Time: 12/15/17  8:25 PM  Result Value Ref Range   Glucose-Capillary 104 (H) 65 - 99 mg/dL  MRSA PCR Screening     Status: None   Collection Time: 12/15/17  8:27 PM  Result Value Ref Range   MRSA by PCR NEGATIVE NEGATIVE    Comment:        The GeneXpert MRSA Assay (FDA approved for NASAL specimens only), is one component of a comprehensive MRSA colonization surveillance program. It is not intended to diagnose MRSA infection nor to guide or monitor treatment for MRSA infections. Performed at [facility], [address] [place], [place] [address]   Blood gas, arterial     Status: Abnormal   Collection Time: 12/16/17  5:27 AM  Result Value Ref Range   FIO2 21.00    pH, Arterial 7.43 7.350 - 7.450   pCO2 arterial 31 (L) 32.0 - 48.0 mmHg   pO2, Arterial 82 (L) 83.0 - 108.0 mmHg   Bicarbonate 20.6 20.0 - 28.0 mmol/L   Acid-base deficit 3.0 (H) 0.0 - 2.0 mmol/L   O2 Saturation 96.3 %   Patient temperature 37.0    Collection site RIGHT RADIAL    Sample type  ARTERIAL DRAW    Allens test (pass/fail) PASS PASS    Comment: Performed at [facility], [address] [place], [place] [address]  Basic metabolic panel     Status: Abnormal   Collection Time: 12/16/17  7:29 AM  Result Value Ref Range   Sodium 133 (L) 135 - 145 mmol/L   Potassium 2.6 (LL) 3.5 - 5.1 mmol/L    Comment: CRITICAL RESULT CALLED TO, READ BACK BY AND VERIFIED WITH ERIN MAYNOR ON 12/16/17 AT 0805 QSD    Chloride 104 101 - 111 mmol/L   CO2 19 (L) 22 - 32 mmol/L   Glucose, Bld 139 (H) 65 - 99 mg/dL   BUN 39 (H) 6 - 20 mg/dL   Creatinine, Ser 1.81 (H) 0.44 - 1.00 mg/dL   Calcium 8.6 (L) 8.9 - 10.3 mg/dL   GFR calc non Af Amer 35 (L) >60 mL/min   GFR calc Af Amer 41 (L) >60 mL/min    Comment: (NOTE) The eGFR has been calculated using the CKD EPI equation. This calculation has not been validated in all clinical situations. eGFR's persistently <60 mL/min signify possible Chronic Kidney Disease.    Anion gap 10 5 - 15    Comment: Performed at [facility], [address] [place], [place] [address]  CBC     Status: Abnormal   Collection Time: 12/16/17  7:29 AM  Result Value Ref Range   WBC 12.7 (H) 3.6 - 11.0 K/uL   RBC 3.47 (L) 3.80 - 5.20 MIL/uL   Hemoglobin 9.9 (L) 12.0 - 16.0 g/dL   HCT 29.8 (L) 35.0 - 47.0 %   MCV 86.0 80.0 - 100.0 fL   MCH 28.5 26.0 - 34.0 pg   MCHC 33.2 32.0 - 36.0 g/dL   RDW 15.7 (H) 11.5 - 14.5 %   Platelets 193 150 - 440 K/uL    Comment: Performed at [facility], [address], [place],  [address]  Magnesium  Status: None   Collection Time: 12/16/17  7:29 AM  Result Value Ref Range   Magnesium 2.4 1.7 - 2.4 mg/dL    Comment: Performed at [facility], [address]  [place], [place] [address]  Phosphorus     Status: None   Collection Time: 12/16/17  7:29 AM  Result Value Ref Range   Phosphorus 2.8 2.5 - 4.6 mg/dL    Comment: Performed at [facility], [address]  [place], [place] [address]  Lactic acid, plasma     Status: None   Collection Time: 12/16/17  7:29 AM  Result Value Ref Range   Lactic Acid, Venous 0.9 0.5 - 1.9 mmol/L    Comment: Performed at [facility], [address] [place], [place] [address]  Troponin I     Status: None   Collection Time: 12/16/17  7:29 AM  Result Value Ref Range   Troponin I <0.03 <0.03 ng/mL    Comment: Performed at [facility], [address] [place], [place] [address]  C-reactive protein     Status: Abnormal   Collection Time: 12/16/17 12:11 PM  Result Value Ref Range   CRP 27.4 (H) <1.0 mg/dL    Comment: Performed at [facility], [place] [address] [place], [place] [address]  Culture, blood (Routine X 2) w Reflex to ID Panel     Status: None (Preliminary result)   Collection Time: 12/16/17 12:11 PM  Result Value Ref Range   Specimen Description BLOOD RT HAND    Special Requests      BOTTLES DRAWN AEROBIC AND ANAEROBIC Blood Culture adequate volume   Culture      NO GROWTH < 24 HOURS Performed at [facility], [address], [place], [place] [address]    Report Status PENDING   Potassium     Status: None   Collection Time: 12/16/17  9:27 PM  Result Value Ref Range   Potassium 3.6 3.5 - 5.1 mmol/L    Comment: Performed at [facility], [address] [place], [place] [address]  Basic metabolic panel     Status: Abnormal   Collection Time: 12/17/17  5:28 AM  Result Value Ref Range   Sodium 134 (L) 135 - 145 mmol/L   Potassium 3.6 3.5 - 5.1 mmol/L   Chloride 106 101 - 111 mmol/L   CO2 19 (L) 22 - 32 mmol/L   Glucose, Bld 108 (H) 65 - 99 mg/dL   BUN 23 (H) 6 - 20 mg/dL   Creatinine, Ser 1.16 (H) 0.44 - 1.00 mg/dL   Calcium 8.3 (L) 8.9 - 10.3 mg/dL   GFR calc non Af Amer >60 >60 mL/min   GFR calc Af Amer >60 >60 mL/min    Comment: (NOTE) The eGFR has been calculated using the CKD EPI equation. This calculation has not been validated in all clinical  situations. eGFR's persistently <60 mL/min signify possible Chronic Kidney Disease.    Anion gap 9 5 - 15    Comment: Performed at [facility], [address] [place], [place] [address]  Phosphorus     Status: Abnormal   Collection Time: 12/17/17  5:28 AM  Result Value Ref Range   Phosphorus 2.3 (L) 2.5 - 4.6 mg/dL    Comment: Performed at [facility], [address], [place], [place] [address]  Magnesium     Status: None   Collection Time: 12/17/17  5:28 AM  Result Value Ref Range  Magnesium 1.9 1.7 - 2.4 mg/dL    Comment: Performed at [facility], [address] [place], [place] [address]  Lactic acid, plasma     Status: None   Collection Time: 12/17/17  5:28 AM  Result Value Ref Range   Lactic Acid, Venous 0.5 0.5 - 1.9 mmol/L    Comment: Performed at [facility], [address] [place], [place] [address]  CBC     Status: Abnormal   Collection Time: 12/17/17  5:28 AM  Result Value Ref Range   WBC 12.0 (H) 3.6 - 11.0 K/uL   RBC 3.29 (L) 3.80 - 5.20 MIL/uL   Hemoglobin 9.5 (L) 12.0 - 16.0 g/dL   HCT 28.1 (L) 35.0 - 47.0 %   MCV 85.6 80.0 - 100.0 fL   MCH 28.9 26.0 - 34.0 pg   MCHC 33.8 32.0 - 36.0 g/dL   RDW 16.1 (H) 11.5 - 14.5 %   Platelets 214 150 - 440 K/uL    Comment: Performed at [facility], [address] [place], [place] [address]   No components found for: ESR, C REACTIVE PROTEIN MICRO: Recent Results (from the past 720 hour(s))  Blood culture (single)     Status: Abnormal (Preliminary result)   Collection Time: 12/15/17  3:47 PM  Result Value Ref Range Status   Specimen Description   Final    BLOOD LEFT FOREARM Performed at [facility], [place] [address] [place], [place] [address]    Special Requests   Final    BOTTLES DRAWN AEROBIC AND ANAEROBIC Blood Culture adequate volume Performed at [address] [place], [place] [address]    Culture  Setup Time   Final    IN  BOTH AEROBIC AND ANAEROBIC BOTTLES GRAM POSITIVE COCCI CRITICAL RESULT CALLED TO, READ BACK BY AND VERIFIED WITH: HANK ZOMPA AT 0915 ON 12/16/17 BY SNJ    Culture (A)  Final    STAPHYLOCOCCUS AUREUS SUSCEPTIBILITIES TO FOLLOW Performed at [facility], [place] [address] [place],  [address]    Report Status PENDING  Incomplete  Blood Culture ID Panel (Reflexed)     Status: Abnormal   Collection Time: 12/15/17  3:47 PM  Result Value Ref Range Status   Enterococcus species NOT DETECTED NOT DETECTED Final   Listeria monocytogenes NOT DETECTED NOT DETECTED Final   Staphylococcus species DETECTED (A) NOT DETECTED Final    Comment: CRITICAL RESULT CALLED TO, READ BACK BY AND VERIFIED WITH: HANK ZOMPA AT 0915 ON 12/16/17 BY SNJ    Staphylococcus aureus DETECTED (A) NOT DETECTED Final    Comment: Methicillin (oxacillin) susceptible Staphylococcus aureus (MSSA). Preferred therapy is anti staphylococcal beta lactam antibiotic (Cefazolin or Nafcillin), unless clinically contraindicated. CRITICAL RESULT CALLED TO, READ BACK BY AND VERIFIED WITH: HANK ZOMPA AT 0915 ON 12/16/17 BY SNJ    Methicillin resistance NOT DETECTED NOT DETECTED Final   Streptococcus species NOT DETECTED NOT DETECTED Final   Streptococcus agalactiae NOT DETECTED NOT DETECTED Final   Streptococcus pneumoniae NOT DETECTED NOT DETECTED Final   Streptococcus pyogenes NOT DETECTED NOT DETECTED Final   Acinetobacter baumannii NOT DETECTED NOT DETECTED Final   Enterobacteriaceae species NOT DETECTED NOT DETECTED Final   Enterobacter cloacae complex NOT DETECTED NOT DETECTED Final   Escherichia coli NOT DETECTED NOT DETECTED Final   Klebsiella oxytoca NOT DETECTED NOT DETECTED Final   Klebsiella pneumoniae NOT DETECTED NOT DETECTED Final   Proteus species NOT DETECTED NOT DETECTED  Final   Serratia marcescens NOT DETECTED NOT DETECTED Final   Haemophilus influenzae NOT DETECTED NOT DETECTED Final   Neisseria meningitidis  NOT DETECTED NOT DETECTED Final   Pseudomonas aeruginosa NOT DETECTED NOT DETECTED Final   Candida albicans NOT DETECTED NOT DETECTED Final   Candida glabrata NOT DETECTED NOT DETECTED Final   Candida krusei NOT DETECTED NOT DETECTED Final   Candida parapsilosis NOT DETECTED NOT DETECTED Final   Candida tropicalis NOT DETECTED NOT DETECTED Final    Comment: Performed at [facility], [address] [place], [place] [address]  MRSA PCR Screening     Status: None   Collection Time: 12/15/17  8:27 PM  Result Value Ref Range Status   MRSA by PCR NEGATIVE NEGATIVE Final    Comment:        The GeneXpert MRSA Assay (FDA approved for NASAL specimens only), is one component of a comprehensive MRSA colonization surveillance program. It is not intended to diagnose MRSA infection nor to guide or monitor treatment for MRSA infections. Performed at [facility], [address] [place], [place] [address]   Culture, blood (Routine X 2) w Reflex to ID Panel     Status: None (Preliminary result)   Collection Time: 12/16/17 12:11 PM  Result Value Ref Range Status   Specimen Description BLOOD RT HAND  Final   Special Requests   Final    BOTTLES DRAWN AEROBIC AND ANAEROBIC Blood Culture adequate volume   Culture   Final    NO GROWTH < 24 HOURS Performed at [facility], [address] [place], [place] [address]    Report Status PENDING  Incomplete    IMAGING: Dg Shoulder Right  Result Date: 12/15/2017 CLINICAL DATA:  Right shoulder pain post fall. EXAM: RIGHT SHOULDER - 2+ VIEW COMPARISON:  None. FINDINGS: There is no evidence of displaced fracture. Glenohumeral joint is located. There is 6.5 mm dissociation at the acromioclavicular joint. The distal clavicle has somewhat indistinct appearance. Soft tissue overlies the distal clavicle. IMPRESSION: Widening of the acromioclavicular interval, which may represent ligamentous injury, or alternatively erosive changes or  avulsion injury of the distal clavicle. Overlying soft tissue swelling. Electronically Signed   By: Fidela Salisbury M.D.   On: 12/15/2017 12:01   Ct Shoulder Right Wo Contrast  Result Date: 12/15/2017 CLINICAL DATA:  Questionable fall, painful shoulder overlying the AC joint. EXAM: CT OF THE UPPER RIGHT EXTREMITY WITHOUT CONTRAST TECHNIQUE: Multidetector CT imaging of the upper right extremity was performed according to the standard protocol. COMPARISON:  None. FINDINGS: Bones/Joint/Cartilage No acute fracture or dislocation. Normal alignment. No joint effusion. Glenohumeral joint is normal. No acromioclavicular joint widening. Partial osteolysis of the distal clavicle at the acromioclavicular joint. Subchondral cystic changes in the adjacent a chromium. Severe soft tissue swelling along the superior aspect of the acromioclavicular joint with soft tissue edema in the subcutaneous fat. Ligaments Ligaments are suboptimally evaluated by CT. Muscles and Tendons Muscles are normal. No muscle atrophy. No intramuscular fluid collection or hematoma. Soft tissue No fluid collection or hematoma. No soft tissue mass. Visualized right lung demonstrates a 10 mm pulmonary nodule in the right upper lobe of uncertain etiology. This may be secondary to an infectious or inflammatory etiology. IMPRESSION: 1. No acromioclavicular joint widening to suggest AC separation. Partial osteolysis of the distal clavicle at the acromioclavicular joint and subchondral cystic changes in the adjacent acromion with severe soft tissue swelling along the superior aspect of the acromioclavicular joint with soft  tissue edema in the subcutaneous fat. Findings are concerning for septic arthritis of the acromioclavicular joint versus less likely posttraumatic osteolysis. If there is further clinical concern, evaluation with an MRI of right shoulder without and with intravenous contrast would be helpful in further characterization. 2. Visualized  right lung demonstrates a 10 mm pulmonary nodule in the right upper lobe of uncertain etiology. This may be secondary to an infectious or inflammatory etiology. Recommend a follow-up CT of the chest without intravenous contrast in 3 months following the patient's acute illness. Electronically Signed   By: Kathreen Devoid   On: 12/15/2017 17:28   Dg Chest Port 1 View  Result Date: 12/17/2017 CLINICAL DATA:  Cough EXAM: PORTABLE CHEST 1 VIEW COMPARISON:  08/17/2017 FINDINGS: Cardiac shadow is mildly prominent accentuated by the portable technique. Postsurgical changes in the lower cervical spine are noted. The lungs are well aerated bilaterally. No focal infiltrate or sizable effusion is seen. No acute bony abnormality is noted. IMPRESSION: No acute abnormality noted. Electronically Signed   By: Inez Catalina M.D.   On: 12/17/2017 07:11    ECHO 12/16/17 Study Conclusions  - Left ventricle: The cavity size was normal. Systolic function was   normal. The estimated ejection fraction was in the range of 55%   to 60%. Wall motion was normal; there were no regional wall   motion abnormalities. Left ventricular diastolic function   parameters were normal. - Aortic valve: There was mild regurgitation. - Left atrium: The atrium was normal in size. - Right ventricle: Systolic function was normal. - Pulmonary arteries: Systolic pressure was within the normal   range. - Inferior vena cava: The vessel was dilated. The respirophasic   diameter changes were blunted (< 50%), consistent with elevated   central venous pressure.  Impressions:  - There was no evidence of a vegetation.  Assessment:   [name] is a 37 y.o. female admitted with R shoulder pain and agitation. She was found to have cellulitis of the shoulder and CT showed likely septic arthritis of AC joint. BCX + MSSA.  On admit had ARF with cr 2.7, CRp 27, ESR 65, wbc 19.  HIV neg, tox Screen + amphetamines, cocaine and opiates.  She  has a history of HCV, possible previous endocarditis as well as IV drug use. She has had multiple admissions over the last year.  Echo neg for vegetations.  FU BCX 2/3 is pending. S/p aspiration R shoukler 2/4 with purulent material obtained- cx pending  Recommendations Cont ancef at dosing for MSSA bacteremia She will need a min 2 week course of IV ancef and given her hx she is not a candidate for home IV therapy.  If possible could go to a SNF but that is usually not a possibility so will likely need to remain here for the full 2 weeks. Stop date 12/30/17 Will then need another 4 weeks of oral keflex 500 mg TID.  Agree with ortho that the aspiration and IV abx may be help avoid open wash out but will watch for now.  Thank you very much for allowing me to participate in the care of this patient. Please call with questions.   Cheral Marker. Ola Spurr, MD

## 2017-12-18 DIAGNOSIS — F4325 Adjustment disorder with mixed disturbance of emotions and conduct: Secondary | ICD-10-CM | POA: Diagnosis not present

## 2017-12-18 LAB — CULTURE, BLOOD (SINGLE): Special Requests: ADEQUATE

## 2017-12-18 LAB — BASIC METABOLIC PANEL
Anion gap: 10 (ref 5–15)
BUN: 14 mg/dL (ref 6–20)
CHLORIDE: 106 mmol/L (ref 101–111)
CO2: 22 mmol/L (ref 22–32)
CREATININE: 1.11 mg/dL — AB (ref 0.44–1.00)
Calcium: 8.4 mg/dL — ABNORMAL LOW (ref 8.9–10.3)
GFR calc non Af Amer: >60 mL/min (ref >60)
GLUCOSE: 143 mg/dL — AB (ref 65–99)
Potassium: 3.4 mmol/L — ABNORMAL LOW (ref 3.5–5.1)
Sodium: 138 mmol/L (ref 135–145)

## 2017-12-18 LAB — MAGNESIUM: Magnesium: 1.7 mg/dL (ref 1.7–2.4)

## 2017-12-18 LAB — PHOSPHORUS: PHOSPHORUS: 2.8 mg/dL (ref 2.5–4.6)

## 2017-12-18 MED ORDER — NICOTINE 21 MG/24HR TD PT24
21.0000 mg | MEDICATED_PATCH | Freq: Every day | TRANSDERMAL | Status: DC
  Administered 2017-12-18 – 2017-12-29 (×12): 21 mg via TRANSDERMAL
  Filled 2017-12-18 (×13): qty 1

## 2017-12-18 MED ORDER — POTASSIUM CHLORIDE CRYS ER 20 MEQ PO TBCR
40.0000 meq | EXTENDED_RELEASE_TABLET | Freq: Once | ORAL | Status: AC
  Administered 2017-12-18: 11:00:00 40 meq via ORAL
  Filled 2017-12-18: qty 2

## 2017-12-18 NOTE — Progress Notes (Signed)
Pharmacy Electrolyte Monitoring Consult:  Pharmacy consulted to assist in monitoring and replacing electrolytes in this 37 y.o. female admitted on 12/15/2017 with septic arthritis and MSSA bacteremia.   Labs:  Sodium (mmol/L)  Date Value  12/18/2017 138  12/14/2014 134 (L)   Potassium (mmol/L)  Date Value  12/18/2017 3.4 (L)  12/14/2014 3.6   Magnesium (mg/dL)  Date Value  16/10/960402/03/2018 1.7   Phosphorus (mg/dL)  Date Value  54/09/811902/03/2018 2.8   Calcium (mg/dL)  Date Value  14/78/295602/03/2018 8.4 (L)   Calcium, Total (mg/dL)  Date Value  21/30/865702/11/2014 8.6   Albumin (g/dL)  Date Value  84/69/629502/12/2017 3.7  12/14/2014 3.6    Plan: K 3.4, Mg and phos WNL. Give KIor-Con 40 mEq po x 1 and recheck electrolytes with AM labs.  Carola FrostNathan A Kashae Carstens, Pharm.D., BCPS Clinical Pharmacist 12/18/2017 7:11 AM

## 2017-12-18 NOTE — Progress Notes (Signed)
[facility] [place] NAME: [name]    MR#:  [id number]  DATE OF BIRTH:  05-Oct-1981  SUBJECTIVE:  CHIEF COMPLAINT:   Chief Complaint  Patient presents with  . Shoulder Pain     History of drug abuse, came with right shoulder pain, noted to have septic shoulder joint.  In drug withdrawal, agitation and started on Precedex , improved and off Precedex now, alert and oriented. C/o shoulder pain still, s/p aspiration of the joint by ortho.  REVIEW OF SYSTEMS:     Review of Systems  Constitutional: Negative for fever, malaise/fatigue and weight loss.  HENT: Negative for congestion, ear discharge and hearing loss.   Eyes: Negative for blurred vision and redness.  Respiratory: Negative for cough, sputum production, shortness of breath, wheezing and stridor.   Cardiovascular: Negative for chest pain, palpitations and orthopnea.  Gastrointestinal: Negative for abdominal pain, diarrhea, heartburn and vomiting.  Genitourinary: Negative for dysuria and frequency.  Musculoskeletal: Positive for joint pain (right shoulder pain). Negative for falls.  Skin: Negative for rash.  Neurological: Negative for dizziness, tremors and focal weakness.  Psychiatric/Behavioral: Positive for substance abuse. Negative for depression and suicidal ideas.    DRUG ALLERGIES:   Allergies  Allergen Reactions  . Amoxicillin Rash  . Latex   . Latex   . Vancomycin   . Vancomycin   . Amoxicillin Rash  . Clindamycin/Lincomycin Rash  . Clindamycin/Lincomycin Rash    VITALS:  Blood pressure (!) 146/85, pulse 87, temperature 98.9 F (37.2 C), temperature source Oral, resp. rate 16, height 5' 7"  (1.702 m), weight 57 kg (125 lb 10.6 oz), SpO2 97 %.  PHYSICAL EXAMINATION:  GENERAL:  37 y.o.-year-old patient lying in the bed with no acute distress.  EYES: Pupils equal, round, reactive to light and accommodation. No scleral icterus. Extraocular muscles intact.   HEENT: Head atraumatic, normocephalic. Oropharynx and nasopharynx clear.  NECK:  Supple, no jugular venous distention. No thyroid enlargement, no tenderness.  LUNGS: Normal breath sounds bilaterally, no wheezing, rales,rhonchi or crepitation. No use of accessory muscles of respiration.  CARDIOVASCULAR: S1, S2 normal. No murmurs, rubs, or gallops.  ABDOMEN: Soft, nontender, nondistended. Bowel sounds present. No organomegaly or mass.  EXTREMITIES: No pedal edema, cyanosis, or clubbing. Right shoulder tender. NEUROLOGIC: Cranial nerves II through XII are intact. No focal weakness, moves all 4 limbs, sensation intact. PSYCHIATRIC: The patient is alert and oriented 3.  SKIN: No obvious rash, lesion, or ulcer.   Physical Exam LABORATORY PANEL:   CBC Recent Labs  Lab 12/17/17 0528  WBC 12.0*  HGB 9.5*  HCT 28.1*  PLT 214   ------------------------------------------------------------------------------------------------------------------  Chemistries  Recent Labs  Lab 12/15/17 1522  12/18/17 0500  NA 133*   < > 138  K 2.9*   < > 3.4*  CL 99*   < > 106  CO2 20*   < > 22  GLUCOSE 122*   < > 143*  BUN 49*   < > 14  CREATININE 2.73*   < > 1.11*  CALCIUM 9.0   < > 8.4*  MG 2.6*   < > 1.7  AST 45*  --   --   ALT 23  --   --   ALKPHOS 121  --   --   BILITOT 0.8  --   --    < > = values in this interval not displayed.   ------------------------------------------------------------------------------------------------------------------  Cardiac Enzymes Recent Labs  Lab 12/16/17  [place] <0.03   ------------------------------------------------------------------------------------------------------------------  RADIOLOGY:  Dg Chest Port 1 View  Result Date: 12/17/2017 CLINICAL DATA:  Cough EXAM: PORTABLE CHEST 1 VIEW COMPARISON:  08/17/2017 FINDINGS: Cardiac shadow is mildly prominent accentuated by the portable technique. Postsurgical changes in the lower cervical spine  are noted. The lungs are well aerated bilaterally. No focal infiltrate or sizable effusion is seen. No acute bony abnormality is noted. IMPRESSION: No acute abnormality noted. Electronically Signed   By: Inez Catalina M.D.   On: 12/17/2017 07:11    ASSESSMENT AND PLAN:   Active Problems:   Septic joint ([place])  1. Right shoulder pain with erythema concerning for septic joint    Gram-positive bacteremia- MSSA  attempted aspiration by ED physician however unsuccessful Orthopedic surgery consulted , s/p aspiration of Rt shoulder, awaited cultures. ID consultation requested High ESR and CRP Echocardiogram to evaluate for endocarditis given history of IV drug use Further recommendations after ENT and ID consult. Follow WBC  Cefazolin per ID consult for now.  2. Polysubstance abuse: admitted to stepdown due to withdrawal and agitation on Precedex Psychiatry consultation requested. Patient has IVC by ER physician Off Precedex drip,Calm and comfortable now.  3. Hypokalemia: Replete and check an a.m. Checked magnesium- normal.  4. Acute kidney injury: Start IV fluids Hold nephrotoxic medications Improved.  All the records are reviewed and case discussed with Care Management/Social Workerr. Management plans discussed with the patient, family and they are in agreement.  CODE STATUS: Full.  TOTAL TIME TAKING CARE OF THIS PATIENT: 35 minutes.    POSSIBLE D/C IN 3-4 DAYS, DEPENDING ON CLINICAL CONDITION.   [name] M.D on 12/18/2017   Between 7am to 6pm - Pager - [phone]  After 6pm go to [email or web address] - password [email or web address] [facility]  Office  [phone]  CC: Primary care physician; Patient, No Pcp Per  Note: This dictation was prepared with Dragon dictation along with smaller phrase technology. Any transcriptional errors that result from this process are unintentional.

## 2017-12-18 NOTE — Consult Note (Signed)
[facility] Consult   Reason for Consult: Follow-up consult 37 year old woman with intravenous drug dependence and a septic joint. Referring Physician:  Marthann Schiller Patient Identification: [name] MRN:  [id number] Principal Diagnosis: <principal problem not specified> Diagnosis:   Patient Active Problem List   Diagnosis Date Noted  . Septic joint ([place]) [M00.9] 12/15/2017  . Cocaine overdose ([place]) [T40.5X1A] 09/15/2017  . Vitamin B12 deficiency [E53.8] 08/23/2017  . Migraine [G43.909] 08/22/2017  . Moderate mixed bipolar I disorder ([place]) [F31.62] 08/18/2017  . Bipolar I disorder, most recent episode depressed ([place]) [F31.30] 08/18/2017  . Opioid dependence with intoxication delirium ([place]) [F11.221]   . Cocaine abuse with cocaine-induced mood disorder ([place]) [F14.14]   . Drug overdose [T50.901A]   . HCAP (healthcare-associated pneumonia) [J18.9] 08/15/2017  . Acute respiratory failure with hypoxia ([place]) [J96.01] 08/15/2017  . Acute respiratory failure with hypoxia and hypercapnia (HCC) [J96.01, J96.02] 08/15/2017  . Acute renal failure ([place]) [N17.9]   . Altered mental status [R41.82] 07/24/2017  . Delirium, drug-induced ([place]) [K16.010] 07/24/2017  . Opiate abuse, continuous ([place]) [F11.10] 07/24/2017  . 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 Severe sepsis ([place]) [A41.9, R65.20] 07/16/2016  . AKI (acute kidney injury) ([place]) [N17.9] 07/16/2016  . Obtundation [R40.1] 07/16/2016  . Elevated lactic acid level [R79.89] 07/16/2016  . Drug abuse, IV ([place]) [F19.10] 07/16/2016  . Elevated LFTs [R94.5] 07/16/2016  . Sepsis ([place]) [A41.9] 07/10/2015  . Oligohydramnios [O41.00X0] 05/18/2015  . S/P cesarean section [Z98.891] 05/18/2015  . Preterm uterine contractions [O47.9] 05/17/2015  . Oligohydramnios antepartum [O41.00X0] 05/17/2015  . H/O cesarean section complicating pregnancy [X32.355] 05/17/2015  . Preterm contractions [O47.9] 05/17/2015  . AA (alcohol  abuse) [F10.10] 12/08/2014  . Drug abuse during pregnancy [facility]) [D32.202, F19.10] 12/08/2014  . High risk sexual behavior [Z72.51] 12/08/2014  . Tenosynovitis [M65.9] 08/19/2014  . Acute deep vein thrombosis of arm (HCC) [I82.629] 08/19/2014  . HCV (hepatitis C virus) [B19.20] 07/17/2013  . H/O cesarean section [Z98.891] 05/18/2013  . Herpes infection in pregnancy [O98.519, B00.9] 05/18/2013  . Infection with methicillin-resistant Staphylococcus aureus [A49.02] 03/19/2013  . Cannabis abuse [F12.10] 03/18/2013  . Polysubstance abuse ([place]) [F19.10] 03/18/2013  . Compulsive tobacco user syndrome [F17.200] 03/17/2013    Total Time spent with patient: 30 minutes  Subjective:   [name] is a 37 y.o. female patient admitted with "I am just eating and sleeping".  HPI: Patient seen.  She was asleep when I came in but woke up easily and engaged appropriately in conversation.  Patient says the pain is a little bit better.  She is not having significant withdrawal symptoms.  Patient is able to articulate an understanding of her infection.  She understands the recommendation for a few weeks of antibiotics.  She was able to understand that this is a potentially fatal illness and that getting it treated his very important.  She is able to understand that terminating intravenous antibiotics early [place] puts her at higher risk of death.  Otherwise she denies depression denies psychosis denies suicidal ideation.  Past Psychiatric History: Patient has a history of long-standing substance abuse without much history of attempts at sobriety.  Risk to Self: Is patient at risk for suicide?: No Risk to Others:   Prior Inpatient Therapy:   Prior Outpatient Therapy:    Past Medical History:  Past Medical History:  Diagnosis Date  . Acute deep vein thrombosis of arm ([place]) 08/19/2014   Last Assessment & Plan:  - diagnosed 08/2014 in  hospital - s/p heparin gtt - patient never took her  xarelto x 3 month course that was planned - no symptoms today.  No further treatment - we discussed avoiding IVDU (see below) to reduce her chances of provoked upper extremity DVT   . Asthma   . HCV (hepatitis C virus) 07/17/2013   Last Assessment & Plan:  - positive antibody most recently 08/2014 - hep C RNA 08/2014 was negative - prior to that, HCV RNA [id number] in 06/2013. No record for genotype or imaging.   - patient counseled that she could become re-infected with continued IVDU or unprotected sexual encounters.   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   Last Assessment & Plan:  Discussed routine use of Valtrex prophylaxis at 36 weeks. She will be delivered by cesarean at 36-37 weeks.   . Infection with methicillin-resistant Staphylococcus aureus 03/19/2013   Overview: Arm abscess with MRSA, treated.  Subsequent screening negative  . IV drug abuse ([place])   . Tenosynovitis 08/19/2014    Past Surgical History:  Procedure Laterality Date  . CESAREAN SECTION    . CESAREAN SECTION N/A 05/18/2015   Procedure: CESAREAN SECTION;  Surgeon: Rubie Maid, MD;  Location: [facility] ORS;  Service: Obstetrics;  Laterality: N/A;  . NECK SURGERY     Fusion   Family History:  Family History  Problem Relation Age of Onset  . Diabetes Mother   . COPD Mother   . Hypertension Mother   . Lung cancer Mother   . Alcohol abuse Father   . Lung cancer Maternal Grandmother    Family Psychiatric  History: None Social History:  Social History   Substance and Sexual Activity  Alcohol Use Yes  . Alcohol/week: 0.0 oz   Comment: bootlegger x2, patient states that she rarely drinks     Social History   Substance and Sexual Activity  Drug Use Yes  . Types: Marijuana, Amphetamines, "Crack" cocaine, IV, Heroin, Cocaine   Comment: uses IV drugs, heroin    Social History   Socioeconomic History  . Marital status: Unknown    Spouse name: None  . Number of  children: None  . Years of education: None  . Highest education level: None  Social Needs  . Financial resource strain: None  . Food insecurity - worry: None  . Food insecurity - inability: None  . Transportation needs - medical: None  . Transportation needs - non-medical: None  Occupational History  . None  Tobacco Use  . Smoking status: Current Every Day Smoker    Packs/day: 1.00    Years: 7.00    Pack years: 7.00    Types: Cigarettes  . Smokeless tobacco: Never Used  . Tobacco comment: Patient refused  Substance and Sexual Activity  . Alcohol use: Yes    Alcohol/week: 0.0 oz    Comment: bootlegger x2, patient states that she rarely drinks  . Drug use: Yes    Types: Marijuana, Amphetamines, "Crack" cocaine, IV, Heroin, Cocaine    Comment: uses IV drugs, heroin  . Sexual activity: Yes    Birth control/protection: Injection    Comment: Patient stated that she is on birth control, injection. She could not remember the name of  it.  Other Topics Concern  . None  Social History Narrative   ** Merged History Encounter **       Lives at home with mother   Additional Social History:    Allergies:  Allergies  Allergen Reactions  . Amoxicillin Rash  . Latex   . Latex   . Vancomycin   . Vancomycin   . Amoxicillin Rash  . Clindamycin/Lincomycin Rash  . Clindamycin/Lincomycin Rash    Labs:  Results for orders placed or performed during the hospital encounter of 12/15/17 (from the past 48 hour(s))  Potassium     Status: None   Collection Time: 12/16/17  9:27 PM  Result Value Ref [place]   Potassium 3.6 3.5 - 5.1 mmol/L    Comment: Performed at [facility], [address] [place], [place] [address]  Basic metabolic panel     Status: Abnormal   Collection Time: 12/17/17  5:28 AM  Result Value Ref [place]   Sodium 134 (L) 135 - 145 mmol/L   Potassium 3.6 3.5 - 5.1 mmol/L   Chloride 106 101 - 111 mmol/L   CO2 19 (L) 22 - 32 mmol/L   Glucose, Bld 108 (H) 65 -  99 mg/dL   BUN 23 (H) 6 - 20 mg/dL   Creatinine, Ser 1.16 (H) 0.44 - 1.00 mg/dL   Calcium 8.3 (L) 8.9 - 10.3 mg/dL   GFR calc non Af Amer >60 >60 mL/min   GFR calc Af Amer >60 >60 mL/min    Comment: (NOTE) The eGFR has been calculated using the CKD EPI equation. This calculation has not been validated in all clinical situations. eGFR's persistently <60 mL/min signify possible Chronic Kidney Disease.    Anion gap 9 5 - 15    Comment: Performed at [facility], [address] [place], [place] [address]  Phosphorus     Status: Abnormal   Collection Time: 12/17/17  5:28 AM  Result Value Ref [place]   Phosphorus 2.3 (L) 2.5 - 4.6 mg/dL    Comment: Performed at [facility], [address] [place], [place] [address]  Magnesium     Status: None   Collection Time: 12/17/17  5:28 AM  Result Value Ref [place]   Magnesium 1.9 1.7 - 2.4 mg/dL    Comment: Performed at [facility], [address] [place], [place] [address]  Lactic acid, plasma     Status: None   Collection Time: 12/17/17  5:28 AM  Result Value Ref [place]   Lactic Acid, Venous 0.5 0.5 - 1.9 mmol/L    Comment: Performed at [facility], [address] [place], [place] [address]  CBC     Status: Abnormal   Collection Time: 12/17/17  5:28 AM  Result Value Ref [place]   WBC 12.0 (H) 3.6 - 11.0 K/uL   RBC 3.29 (L) 3.80 - 5.20 MIL/uL   Hemoglobin 9.5 (L) 12.0 - 16.0 g/dL   HCT 28.1 (L) 35.0 - 47.0 %   MCV 85.6 80.0 - 100.0 fL   MCH 28.9 26.0 - 34.0 pg   MCHC 33.8 32.0 - 36.0 g/dL   RDW 16.1 (H) 11.5 - 14.5 %   Platelets 214 150 - 440 K/uL    Comment: Performed at [facility], [address] [place], [place] [address]  Body fluid culture     Status: None (Preliminary result)   Collection Time: 12/17/17  2:06 PM  Result Value Ref [place]   Specimen Description      SHOULDER Performed at [facility], [address] [place], [place] [address]    Special Requests       RIGHT SHOULDER FLD Performed at [facility], [address] [place], [place] [address]    Gram Stain      ABUNDANT WBC PRESENT,BOTH PMN AND MONONUCLEAR MODERATE GRAM POSITIVE COCCI Performed at [facility], [place] [address], [place], [place] [address]    Culture PENDING    Report Status PENDING   Basic metabolic panel     Status: Abnormal   Collection Time: 12/18/17  5:00 AM  Result Value Ref [place]   Sodium 138 135 - 145 mmol/L   Potassium 3.4 (L) 3.5 - 5.1 mmol/L   Chloride 106 101 - 111 mmol/L   CO2 22 22 - 32 mmol/L   Glucose, Bld 143 (H) 65 - 99 mg/dL   BUN 14 6 - 20 mg/dL   Creatinine, Ser 1.11 (H) 0.44 - 1.00 mg/dL   Calcium 8.4 (L) 8.9 - 10.3 mg/dL   GFR calc non Af Amer >60 >60 mL/min   GFR calc Af Amer >60 >60 mL/min    Comment: (NOTE) The eGFR has been calculated using the CKD EPI equation. This calculation has not been validated in all clinical situations. eGFR's persistently <60 mL/min signify possible Chronic Kidney Disease.    Anion gap 10 5 - 15    Comment: Performed at [facility], [address] [place], [place] [address]  Magnesium     Status: None   Collection Time: 12/18/17  5:00 AM  Result Value Ref [place]   Magnesium 1.7 1.7 - 2.4 mg/dL    Comment: Performed at [facility], [address] [place], [place] [address]  Phosphorus     Status: None   Collection Time: 12/18/17  5:00 AM  Result Value Ref [place]   Phosphorus 2.8 2.5 - 4.6 mg/dL    Comment: Performed at [facility], [address] [place],  [address]    Current Facility-Administered Medications  Medication Dose Route Frequency Provider Last Rate Last Dose  . 0.9 %  sodium chloride infusion   Intravenous Continuous Bettey Costa, MD 100 mL/hr at 12/17/17 2144    . acetaminophen (TYLENOL) tablet 650 mg  650 mg Oral Q6H PRN Bettey Costa, MD       Or  . acetaminophen (TYLENOL) suppository 650 mg  650 mg Rectal Q6H PRN Mody, Sital, MD       . ceFAZolin (ANCEF) IVPB 2g/100 mL premix  2 g Intravenous Q8H [name], MD   Stopped at 12/18/17 1430  . cloNIDine (CATAPRES - Dosed in mg/24 hr) patch 0.2 mg  0.2 mg Transdermal Weekly Tukov, Magadalene S, NP   0.2 mg at 12/16/17 0550  . docusate sodium (COLACE) capsule 100 mg  100 mg Oral Daily [place] Dragon, MD   100 mg at 12/18/17 1122  . enoxaparin (LOVENOX) injection 40 mg  40 mg Subcutaneous Q24H Bettey Costa, MD   40 mg at 12/17/17 2144  . hydrALAZINE (APRESOLINE) injection 10 mg  10 mg Intravenous Q2H PRN Dorene Sorrow S, NP   10 mg at 12/17/17 0757  . HYDROcodone-acetaminophen (NORCO/VICODIN) 5-325 MG per tablet 1-2 tablet  1-2 tablet Oral Q4H PRN Bettey Costa, MD   2 tablet at 12/18/17 1627  . HYDROmorphone (DILAUDID) injection 1 mg  1 mg Intravenous Q3H PRN Bettey Costa, MD   1 mg at 12/17/17 1423  . nicotine (NICODERM CQ - dosed in mg/24 hours) patch 21 mg  21 mg Transdermal Daily Shahiem Bedwell T, MD      . senna-docusate (Senokot-S) tablet 1 tablet  1 tablet Oral QHS PRN  Bettey Costa, MD        Musculoskeletal: Strength & Muscle Tone: decreased Gait & Station: unsteady Patient leans: N/A  Psychiatric Specialty Exam: Physical Exam  Nursing note and vitals reviewed. Constitutional: She appears well-developed and well-nourished.  HENT:  Head: Normocephalic and atraumatic.  Eyes: Conjunctivae are normal. Pupils are equal, round, and reactive to light.  Neck: Normal range of motion.  Cardiovascular: Regular rhythm and normal heart sounds.  Respiratory: Effort normal. No respiratory distress.  GI: Soft.  Musculoskeletal: Normal range of motion.  Neurological: She is alert.  Skin: Skin is warm and dry.  Psychiatric: Her speech is normal and behavior is normal. Judgment and thought content normal. Her mood appears anxious. Cognition and memory are normal.    Review of Systems  Constitutional: Negative.   HENT: Negative.   Eyes: Negative.   Respiratory:  Negative.   Cardiovascular: Negative.   Gastrointestinal: Negative.   Musculoskeletal: Positive for joint pain.  Skin: Negative.   Neurological: Negative.   Psychiatric/Behavioral: Negative.     Blood pressure (!) 146/85, pulse 87, temperature 98.9 F (37.2 C), temperature source Oral, resp. rate 16, height 5' 7"  (1.702 m), weight 57 kg (125 lb 10.6 oz), SpO2 97 %.Body mass index is 19.68 kg/m.  General Appearance: Disheveled  Eye Contact:  Good  Speech:  Clear and Coherent  Volume:  Normal  Mood:  Euthymic  Affect:  Congruent  Thought Process:  Goal Directed  Orientation:  Full (Time, Place, and Person)  Thought Content:  Logical  Suicidal Thoughts:  No  Homicidal Thoughts:  No  Memory:  Immediate;   Fair Recent;   Fair Remote;   Fair  Judgement:  Fair  Insight:  Fair  Psychomotor Activity:  Normal  Concentration:  Concentration: Fair  Recall:  [employer] of Knowledge:  Fair  Language:  Fair  Akathisia:  No  Handed:  Right  AIMS (if indicated):     Assets:  Desire for Improvement  ADL's:  Intact  Cognition:  WNL  Sleep:        Treatment Plan Summary: Plan 37 year old woman with intravenous drug dependence.  She is denying any suicidal thoughts.  She states that she plans to stay for the full course of antibiotics and is able to articulate an understanding of the risks of not doing so.  I was also very straightforward with her in explaining that in these situations there is a real concern about people bringing drugs into her while she is in the hospital and explained that nursing will be watching this and if there is any sign of it that something will need to be done about it because of the dangerousness.  She expresses an understanding of that as well.  I do not think she meets commitment criteria any longer.  I have discontinued the IV C.  I spoke with nursing and explained the situation.  Recommended that they might consider putting 1 of the electronic surveillance  devices in the room if they are concerned about any kind of behavior problems.  No need for any medication other than starting her on a nicotine patch at her request.  Disposition: No evidence of imminent risk to self or others at present.   Patient does not meet criteria for psychiatric inpatient admission. Supportive therapy provided about ongoing stressors.  Alethia Berthold, MD 12/18/2017 7:09 PM

## 2017-12-19 LAB — BASIC METABOLIC PANEL
Anion gap: 7 (ref 5–15)
BUN: 14 mg/dL (ref 6–20)
CHLORIDE: 109 mmol/L (ref 101–111)
CO2: 25 mmol/L (ref 22–32)
CREATININE: 1.03 mg/dL — AB (ref 0.44–1.00)
Calcium: 8.7 mg/dL — ABNORMAL LOW (ref 8.9–10.3)
GFR calc Af Amer: >60 mL/min (ref >60)
GFR calc non Af Amer: >60 mL/min (ref >60)
Glucose, Bld: 101 mg/dL — ABNORMAL HIGH (ref 65–99)
Potassium: 3.8 mmol/L (ref 3.5–5.1)
SODIUM: 141 mmol/L (ref 135–145)

## 2017-12-19 LAB — MAGNESIUM: MAGNESIUM: 1.5 mg/dL — AB (ref 1.7–2.4)

## 2017-12-19 LAB — PHOSPHORUS: PHOSPHORUS: 3.3 mg/dL (ref 2.5–4.6)

## 2017-12-19 MED ORDER — MAGNESIUM SULFATE 2 GM/50ML IV SOLN
2.0000 g | Freq: Once | INTRAVENOUS | Status: AC
  Administered 2017-12-19: 09:00:00 2 g via INTRAVENOUS
  Filled 2017-12-19: qty 50

## 2017-12-19 MED ORDER — POTASSIUM CHLORIDE CRYS ER 20 MEQ PO TBCR
20.0000 meq | EXTENDED_RELEASE_TABLET | Freq: Once | ORAL | Status: AC
  Administered 2017-12-19: 20 meq via ORAL
  Filled 2017-12-19: qty 1

## 2017-12-19 NOTE — Progress Notes (Signed)
[place] INFECTIOUS DISEASE PROGRESS NOTE Date of Admission:  12/15/2017     ID: [name] is a 37 y.o. female with  MSSA bacteremia and Shoulder septic joint Active Problems:   Septic joint ([place])   Subjective: No fevers, still with pain. More alert, friend at bedside  ROS  Eleven systems are reviewed and negative except per hpi  Medications:  Antibiotics Given (last 72 hours)    Date/Time Action Medication Dose Rate   12/16/17 2114 New Bag/Given   ceFAZolin (ANCEF) IVPB 2g/100 mL premix 2 g 200 mL/hr   12/17/17 0536 New Bag/Given   ceFAZolin (ANCEF) IVPB 2g/100 mL premix 2 g 200 mL/hr   12/17/17 1516 New Bag/Given   ceFAZolin (ANCEF) IVPB 2g/100 mL premix 2 g 200 mL/hr   12/17/17 2144 New Bag/Given   ceFAZolin (ANCEF) IVPB 2g/100 mL premix 2 g 200 mL/hr   12/18/17 0503 New Bag/Given   ceFAZolin (ANCEF) IVPB 2g/100 mL premix 2 g 200 mL/hr   12/18/17 1400 New Bag/Given   ceFAZolin (ANCEF) IVPB 2g/100 mL premix 2 g 200 mL/hr   12/18/17 2146 New Bag/Given   ceFAZolin (ANCEF) IVPB 2g/100 mL premix 2 g 200 mL/hr   12/19/17 3244 New Bag/Given   ceFAZolin (ANCEF) IVPB 2g/100 mL premix 2 g 200 mL/hr   12/19/17 1503 New Bag/Given   ceFAZolin (ANCEF) IVPB 2g/100 mL premix 2 g 200 mL/hr     . cloNIDine  0.2 mg Transdermal Weekly  . docusate sodium  100 mg Oral Daily  . enoxaparin (LOVENOX) injection  40 mg Subcutaneous Q24H  . nicotine  21 mg Transdermal Daily    Objective: Vital signs in last 24 hours: Temp:  [98.9 F (37.2 C)-99.1 F (37.3 C)] 98.9 F (37.2 C) (02/06 1326) Pulse Rate:  [84-95] 89 (02/06 1326) Resp:  [12-18] 18 (02/06 1326) BP: (137-147)/(66-91) 137/66 (02/06 1326) SpO2:  [97 %-99 %] 99 % (02/06 1326) Constitutional: disheveled, in some pain  HENT: [place]/AT, PERRLA, no scleral icterus periorbital ecchymoses  Mouth/Throat: Oropharynx is clear and dry.   Cardiovascular: tachy, 2/6 sm  Pulmonary/Chest: Effort normal and breath sounds normal.  No respiratory distress.  has no wheezes.  Neck = supple, no nuchal rigidity Abdominal: Soft. Bowel sounds are normal.  exhibits no distension. There is no tenderness.  Lymphadenopathy: no cervical adenopathy. No axillary adenopathy Neurological: alert interactive.  Ext - R shoulder with limited rom due to pain, mod erythema surrounding it, receding from line draw previously Skin: no rash. Psychiatric: alert, flat affect    Lab Results Recent Labs    12/17/17 0528 12/18/17 0500 12/19/17 0334  WBC 12.0*  --   --   HGB 9.5*  --   --   HCT 28.1*  --   --   NA 134* 138 141  K 3.6 3.4* 3.8  CL 106 106 109  CO2 19* 22 25  BUN 23* 14 14  CREATININE 1.16* 1.11* 1.03*    Microbiology: Results for orders placed or performed during the hospital encounter of 12/15/17  Blood culture (single)     Status: Abnormal   Collection Time: 12/15/17  3:47 PM  Result Value Ref Range Status   Specimen Description   Final    BLOOD LEFT FOREARM Performed at [facility], [place] [address] [place], [place] [address]    Special Requests   Final    BOTTLES DRAWN AEROBIC AND ANAEROBIC Blood Culture adequate volume Performed at [facility], [place]  [address] [place], [place] [address]    Culture  Setup Time   Final    IN BOTH AEROBIC AND ANAEROBIC BOTTLES GRAM POSITIVE COCCI CRITICAL RESULT CALLED TO, READ BACK BY AND VERIFIED WITH: HANK ZOMPA AT 0915 ON 12/16/17 BY SNJ Performed at [facility], [place] [address] [place], [place] [address]    Culture STAPHYLOCOCCUS AUREUS (A)  Final   Report Status 12/18/2017 FINAL  Final   Organism ID, Bacteria STAPHYLOCOCCUS AUREUS  Final      Susceptibility   Staphylococcus aureus - MIC*    CIPROFLOXACIN <=0.5 SENSITIVE Sensitive     ERYTHROMYCIN <=0.25 SENSITIVE Sensitive     GENTAMICIN <=0.5 SENSITIVE Sensitive     OXACILLIN <=0.25 SENSITIVE Sensitive     TETRACYCLINE <=1 SENSITIVE Sensitive     VANCOMYCIN 1 SENSITIVE Sensitive      TRIMETH/SULFA <=10 SENSITIVE Sensitive     CLINDAMYCIN <=0.25 SENSITIVE Sensitive     RIFAMPIN <=0.5 SENSITIVE Sensitive     Inducible Clindamycin NEGATIVE Sensitive     * STAPHYLOCOCCUS AUREUS  Blood Culture ID Panel (Reflexed)     Status: Abnormal   Collection Time: 12/15/17  3:47 PM  Result Value Ref Range Status   Enterococcus species NOT DETECTED NOT DETECTED Final   Listeria monocytogenes NOT DETECTED NOT DETECTED Final   Staphylococcus species DETECTED (A) NOT DETECTED Final    Comment: CRITICAL RESULT CALLED TO, READ BACK BY AND VERIFIED WITH: HANK ZOMPA AT 0915 ON 12/16/17 BY SNJ    Staphylococcus aureus DETECTED (A) NOT DETECTED Final    Comment: Methicillin (oxacillin) susceptible Staphylococcus aureus (MSSA). Preferred therapy is anti staphylococcal beta lactam antibiotic (Cefazolin or Nafcillin), unless clinically contraindicated. CRITICAL RESULT CALLED TO, READ BACK BY AND VERIFIED WITH: HANK ZOMPA AT 0915 ON 12/16/17 BY SNJ    Methicillin resistance NOT DETECTED NOT DETECTED Final   Streptococcus species NOT DETECTED NOT DETECTED Final   Streptococcus agalactiae NOT DETECTED NOT DETECTED Final   Streptococcus pneumoniae NOT DETECTED NOT DETECTED Final   Streptococcus pyogenes NOT DETECTED NOT DETECTED Final   Acinetobacter baumannii NOT DETECTED NOT DETECTED Final   Enterobacteriaceae species NOT DETECTED NOT DETECTED Final   Enterobacter cloacae complex NOT DETECTED NOT DETECTED Final   Escherichia coli NOT DETECTED NOT DETECTED Final   Klebsiella oxytoca NOT DETECTED NOT DETECTED Final   Klebsiella pneumoniae NOT DETECTED NOT DETECTED Final   Proteus species NOT DETECTED NOT DETECTED Final   Serratia marcescens NOT DETECTED NOT DETECTED Final   Haemophilus influenzae NOT DETECTED NOT DETECTED Final   Neisseria meningitidis NOT DETECTED NOT DETECTED Final   Pseudomonas aeruginosa NOT DETECTED NOT DETECTED Final   Candida albicans NOT DETECTED NOT DETECTED Final    Candida glabrata NOT DETECTED NOT DETECTED Final   Candida krusei NOT DETECTED NOT DETECTED Final   Candida parapsilosis NOT DETECTED NOT DETECTED Final   Candida tropicalis NOT DETECTED NOT DETECTED Final    Comment: Performed at [facility], [place] [place], [place] [address]  MRSA PCR Screening     Status: None   Collection Time: 12/15/17  8:27 PM  Result Value Ref Range Status   MRSA by PCR NEGATIVE NEGATIVE Final    Comment:        The GeneXpert MRSA Assay (FDA approved for NASAL specimens only), is one component of a comprehensive MRSA colonization surveillance program. It is not intended to diagnose MRSA infection nor to guide or monitor treatment for MRSA infections. Performed at  [facility], [address], [place], [place] [address]   Culture, blood (Routine X 2) w Reflex to ID Panel     Status: None (Preliminary result)   Collection Time: 12/16/17 12:11 PM  Result Value Ref Range Status   Specimen Description BLOOD RT HAND  Final   Special Requests   Final    BOTTLES DRAWN AEROBIC AND ANAEROBIC Blood Culture adequate volume   Culture   Final    NO GROWTH 3 DAYS Performed at [facility], [address] [place], [place] [address]    Report Status PENDING  Incomplete  Body fluid culture     Status: None (Preliminary result)   Collection Time: 12/17/17  2:06 PM  Result Value Ref Range Status   Specimen Description   Final    SHOULDER Performed at [facility], [address], [place], [place] [address]    Special Requests   Final    RIGHT SHOULDER FLD Performed at [facility], [address] [place], [place] [address]    Gram Stain   Final    ABUNDANT WBC PRESENT,BOTH PMN AND MONONUCLEAR MODERATE GRAM POSITIVE COCCI    Culture   Final    ABUNDANT STAPHYLOCOCCUS AUREUS SUSCEPTIBILITIES TO FOLLOW Performed at [facility], [place] [address] [place], [place] [address]    Report Status PENDING   Incomplete    Studies/Results: No results found.  Assessment/Plan: [name] is a 37 y.o. female admitted with R shoulder pain and agitation. She was found to have cellulitis of the shoulder and CT showed likely septic arthritis of AC joint. BCX + MSSA.  On admit had ARF with cr 2.7, CRp 27, ESR 65, wbc 19.  HIV neg, tox Screen + amphetamines, cocaine and opiates.  She has a history of HCV, possible previous endocarditis as well as IV drug use. She has had multiple admissions over the last year.  Echo neg for vegetations.  FU BCX 2/3 is Neg S/p aspiration R shoukler 2/4 with purulent material obtained- cx with abundant staph.  Renal function improving.    Recommendations Cont ancef at dosing for MSSA bacteremia She will need a min 2 week course of IV ancef and given her hx she is not a candidate for home IV therapy.  If possible could go to a SNF but that is usually not a possibility so will likely need to remain here for the full 2 weeks. Stop date 12/30/17 Will then need another 4 weeks of oral keflex 500 mg TID.  Agree with ortho that the aspiration and IV abx may be help avoid open wash out but will watch for now.    Thank you very much for the consult. Will follow with you.  Leonel Ramsay   12/19/2017, 3:45 PM

## 2017-12-19 NOTE — Progress Notes (Signed)
Pharmacy Electrolyte Monitoring Consult:  Pharmacy consulted to assist in monitoring and replacing electrolytes in this 37 y.o. female admitted on 12/15/2017 with septic arthritis and MSSA bacteremia.   Labs:  Sodium (mmol/L)  Date Value  12/19/2017 141  12/14/2014 134 (L)   Potassium (mmol/L)  Date Value  12/19/2017 3.8  12/14/2014 3.6   Magnesium (mg/dL)  Date Value  75/64/332902/04/2018 1.5 (L)   Phosphorus (mg/dL)  Date Value  51/88/416602/04/2018 3.3   Calcium (mg/dL)  Date Value  06/30/160102/04/2018 8.7 (L)   Calcium, Total (mg/dL)  Date Value  09/32/355702/11/2014 8.6   Albumin (g/dL)  Date Value  32/20/254202/12/2017 3.7  12/14/2014 3.6    Plan: K 3.8, Mg 1.5, phos 3.3. Give KIor-Con 20 mEq po x 1, magnesium sulfate 2 gm IV x 1 and recheck electrolytes with AM labs.  Carola FrostNathan A Ollivander See, Pharm.D., BCPS Clinical Pharmacist 12/19/2017 7:16 AM

## 2017-12-19 NOTE — Progress Notes (Signed)
[place] at [place] NAME: [name]    MR#:  [id number]  DATE OF BIRTH:  07/01/1981  SUBJECTIVE:  CHIEF COMPLAINT:   Chief Complaint  Patient presents with  . Shoulder Pain     History of drug abuse, came with right shoulder pain, noted to have septic shoulder joint.  In drug withdrawal, agitation and started on Precedex , improved and off Precedex now, alert and oriented. C/o shoulder pain still, s/p aspiration of the joint by ortho.  REVIEW OF SYSTEMS:     Review of Systems  Constitutional: Negative for fever, malaise/fatigue and weight loss.  HENT: Negative for congestion, ear discharge and hearing loss.   Eyes: Negative for blurred vision and redness.  Respiratory: Negative for cough, sputum production, shortness of breath, wheezing and stridor.   Cardiovascular: Negative for chest pain, palpitations and orthopnea.  Gastrointestinal: Negative for abdominal pain, diarrhea, heartburn and vomiting.  Genitourinary: Negative for dysuria and frequency.  Musculoskeletal: Positive for joint pain (right shoulder pain). Negative for falls.  Skin: Negative for rash.  Neurological: Negative for dizziness, tremors and focal weakness.  Psychiatric/Behavioral: Positive for substance abuse. Negative for depression and suicidal ideas.    DRUG ALLERGIES:   Allergies  Allergen Reactions  . Amoxicillin Rash  . Latex   . Latex   . Vancomycin   . Vancomycin   . Amoxicillin Rash  . Clindamycin/Lincomycin Rash  . Clindamycin/Lincomycin Rash    VITALS:  Blood pressure (!) 147/85, pulse 95, temperature 99.1 F (37.3 C), temperature source Oral, resp. rate 12, height 5' 7" (1.702 m), weight 57 kg (125 lb 10.6 oz), SpO2 98 %.  PHYSICAL EXAMINATION:  GENERAL:  37 y.o.-year-old patient lying in the bed with no acute distress.  EYES: Pupils equal, round, reactive to light and accommodation. No scleral icterus. Extraocular muscles intact.   HEENT: Head atraumatic, normocephalic. Oropharynx and nasopharynx clear.  NECK:  Supple, no jugular venous distention. No thyroid enlargement, no tenderness.  LUNGS: Normal breath sounds bilaterally, no wheezing, rales,rhonchi or crepitation. No use of accessory muscles of respiration.  CARDIOVASCULAR: S1, S2 normal. No murmurs, rubs, or gallops.  ABDOMEN: Soft, nontender, nondistended. Bowel sounds present. No organomegaly or mass.  EXTREMITIES: No pedal edema, cyanosis, or clubbing. Right shoulder tender. NEUROLOGIC: Cranial nerves II through XII are intact. No focal weakness, moves all 4 limbs- limited on RUL due to shoulder pain, sensation intact. PSYCHIATRIC: The patient is alert and oriented 3.  SKIN: No obvious rash, lesion, or ulcer.   Physical Exam LABORATORY PANEL:   CBC Recent Labs  Lab 12/17/17 0528  WBC 12.0*  HGB 9.5*  HCT 28.1*  PLT 214   ------------------------------------------------------------------------------------------------------------------  Chemistries  Recent Labs  Lab 12/15/17 1522  12/19/17 0334  NA 133*   < > 141  K 2.9*   < > 3.8  CL 99*   < > 109  CO2 20*   < > 25  GLUCOSE 122*   < > 101*  BUN 49*   < > 14  CREATININE 2.73*   < > 1.03*  CALCIUM 9.0   < > 8.7*  MG 2.6*   < > 1.5*  AST 45*  --   --   ALT 23  --   --   ALKPHOS 121  --   --   BILITOT 0.8  --   --    < > = values in this interval not displayed.   ------------------------------------------------------------------------------------------------------------------  Cardiac Enzymes Recent Labs  Lab 12/16/17 0729  TROPONINI <0.03   ------------------------------------------------------------------------------------------------------------------  RADIOLOGY:  No results found.  ASSESSMENT AND PLAN:   Active Problems:   Septic joint (HCC)  1. Right shoulder pain with erythema concerning for septic joint    Gram-positive bacteremia- MSSA  attempted aspiration by ED  physician however unsuccessful Orthopedic surgery consulted , s/p aspiration of Rt shoulder, awaited cultures. ID consultation requested High ESR and CRP Echocardiogram to evaluate for endocarditis given history of IV drug use Further recommendations after ENT and ID consult. Follow WBC  Cefazolin per ID consult for now.   This patient is IV drug abuser, she is not a candidate for outpatient IV therapy so we will have to keep in the hospital to finish her IV treatment.  2. Polysubstance abuse: admitted to stepdown due to withdrawal and agitation on Precedex Psychiatry consultation requested. Patient has IVC by ER physician Off Precedex drip,Calm and comfortable now.  3. Hypokalemia: Replete and check an a.m. Checked magnesium- normal.  4. Acute kidney injury: Start IV fluids Hold nephrotoxic medications Improved.  All the records are reviewed and case discussed with Care Management/Social Workerr. Management plans discussed with the patient, family and they are in agreement.  CODE STATUS: Full.  TOTAL TIME TAKING CARE OF THIS PATIENT: 35 minutes.    POSSIBLE D/C IN 3-4 DAYS, DEPENDING ON CLINICAL CONDITION.     M.D on 12/19/2017   Between 7am to 6pm - Pager - [phone]  After 6pm go to [email or web address] - password [email or web address]  [facility]  Office  [phone]  CC: Primary care physician; Patient, No Pcp Per  Note: This dictation was prepared with Dragon dictation along with smaller phrase technology. Any transcriptional errors that result from this process are unintentional. 

## 2017-12-20 DIAGNOSIS — F4325 Adjustment disorder with mixed disturbance of emotions and conduct: Secondary | ICD-10-CM | POA: Diagnosis not present

## 2017-12-20 LAB — BASIC METABOLIC PANEL
Anion gap: 11 (ref 5–15)
BUN: 10 mg/dL (ref 6–20)
CO2: 24 mmol/L (ref 22–32)
CREATININE: 0.9 mg/dL (ref 0.44–1.00)
Calcium: 8.9 mg/dL (ref 8.9–10.3)
Chloride: 101 mmol/L (ref 101–111)
GFR calc Af Amer: >60 mL/min (ref >60)
GFR calc non Af Amer: >60 mL/min (ref >60)
GLUCOSE: 107 mg/dL — AB (ref 65–99)
Potassium: 4.2 mmol/L (ref 3.5–5.1)
Sodium: 136 mmol/L (ref 135–145)

## 2017-12-20 LAB — BODY FLUID CULTURE

## 2017-12-20 LAB — MAGNESIUM: Magnesium: 1.8 mg/dL (ref 1.7–2.4)

## 2017-12-20 LAB — PHOSPHORUS: PHOSPHORUS: 3.9 mg/dL (ref 2.5–4.6)

## 2017-12-20 MED ORDER — LORAZEPAM 1 MG PO TABS
1.0000 mg | ORAL_TABLET | Freq: Two times a day (BID) | ORAL | Status: DC | PRN
  Administered 2017-12-29 – 2017-12-30 (×2): 1 mg via ORAL
  Filled 2017-12-20 (×3): qty 1

## 2017-12-20 MED ORDER — KETOROLAC TROMETHAMINE 30 MG/ML IJ SOLN
15.0000 mg | Freq: Four times a day (QID) | INTRAMUSCULAR | Status: DC
  Administered 2017-12-20 – 2017-12-25 (×17): 15 mg via INTRAVENOUS
  Filled 2017-12-20 (×18): qty 1

## 2017-12-20 MED ORDER — SODIUM CHLORIDE 0.9% FLUSH: 10.0000 mL | INTRAVENOUS | Status: DC | PRN

## 2017-12-20 MED ORDER — IBUPROFEN 400 MG PO TABS: 400.0000 mg | ORAL_TABLET | Freq: Four times a day (QID) | ORAL | Status: DC

## 2017-12-20 MED ORDER — SODIUM CHLORIDE 0.9% FLUSH
10.0000 mL | Freq: Two times a day (BID) | INTRAVENOUS | Status: DC
  Administered 2017-12-20 – 2017-12-28 (×14): 10 mL
  Administered 2017-12-29: 20 mL
  Administered 2017-12-29 – 2017-12-30 (×2): 10 mL

## 2017-12-20 NOTE — Progress Notes (Signed)
[place] at [place] NAME: [name]    MR#:  [id number]  DATE OF BIRTH:  1981/07/26  SUBJECTIVE:  CHIEF COMPLAINT:   Chief Complaint  Patient presents with  . Shoulder Pain     History of drug abuse, came with right shoulder pain, noted to have septic shoulder joint.  In drug withdrawal, agitation and started on Precedex , improved and off Precedex now, alert and oriented. C/o shoulder pain still, s/p aspiration of the joint by ortho.  REVIEW OF SYSTEMS:     Review of Systems  Constitutional: Negative for fever, malaise/fatigue and weight loss.  HENT: Negative for congestion, ear discharge and hearing loss.   Eyes: Negative for blurred vision and redness.  Respiratory: Negative for cough, sputum production, shortness of breath, wheezing and stridor.   Cardiovascular: Negative for chest pain, palpitations and orthopnea.  Gastrointestinal: Negative for abdominal pain, diarrhea, heartburn and vomiting.  Genitourinary: Negative for dysuria and frequency.  Musculoskeletal: Positive for joint pain (right shoulder pain). Negative for falls.  Skin: Negative for rash.  Neurological: Negative for dizziness, tremors and focal weakness.  Psychiatric/Behavioral: Positive for substance abuse. Negative for depression and suicidal ideas.    DRUG ALLERGIES:   Allergies  Allergen Reactions  . Amoxicillin Rash  . Latex   . Latex   . Vancomycin   . Vancomycin   . Amoxicillin Rash  . Clindamycin/Lincomycin Rash  . Clindamycin/Lincomycin Rash    VITALS:  Blood pressure (!) 154/70, pulse 93, temperature 98.9 F (37.2 C), temperature source Oral, resp. rate 18, height 5' 7"  (1.702 m), weight 57 kg (125 lb 10.6 oz), SpO2 95 %.  PHYSICAL EXAMINATION:  GENERAL:  37 y.o.-year-old patient lying in the bed with no acute distress.  EYES: Pupils equal, round, reactive to light and accommodation. No scleral icterus. Extraocular muscles intact.   HEENT: Head atraumatic, normocephalic. Oropharynx and nasopharynx clear.  NECK:  Supple, no jugular venous distention. No thyroid enlargement, no tenderness.  LUNGS: Normal breath sounds bilaterally, no wheezing, rales,rhonchi or crepitation. No use of accessory muscles of respiration.  CARDIOVASCULAR: S1, S2 normal. No murmurs, rubs, or gallops.  ABDOMEN: Soft, nontender, nondistended. Bowel sounds present. No organomegaly or mass.  EXTREMITIES: No pedal edema, cyanosis, or clubbing. Right shoulder tender. NEUROLOGIC: Cranial nerves II through XII are intact. No focal weakness, moves all 4 limbs- limited on RUL due to shoulder pain, sensation intact. PSYCHIATRIC: The patient is alert and oriented 3.  SKIN: No obvious rash, lesion, or ulcer.   Physical Exam LABORATORY PANEL:   CBC Recent Labs  Lab 12/17/17 0528  WBC 12.0*  HGB 9.5*  HCT 28.1*  PLT 214   ------------------------------------------------------------------------------------------------------------------  Chemistries  Recent Labs  Lab 12/15/17 1522  12/20/17 0741  NA 133*   < > 136  K 2.9*   < > 4.2  CL 99*   < > 101  CO2 20*   < > 24  GLUCOSE 122*   < > 107*  BUN 49*   < > 10  CREATININE 2.73*   < > 0.90  CALCIUM 9.0   < > 8.9  MG 2.6*   < > 1.8  AST 45*  --   --   ALT 23  --   --   ALKPHOS 121  --   --   BILITOT 0.8  --   --    < > = values in this interval not displayed.   ------------------------------------------------------------------------------------------------------------------  Cardiac Enzymes Recent Labs  Lab 12/16/17 0729  TROPONINI <0.03   ------------------------------------------------------------------------------------------------------------------  RADIOLOGY:  No results found.  ASSESSMENT AND PLAN:   Active Problems:   Septic joint ([place])  1. Right shoulder pain with erythema concerning for septic joint    Gram-positive bacteremia- MSSA  attempted aspiration by ED  physician however unsuccessful Orthopedic surgery consulted , s/p aspiration of Rt shoulder, reviewed cultures. ID consultation appreciated High ESR and CRP Echocardiogram to evaluate for endocarditis given history of IV drug use- no vegetations. Further recommendations after ortho and ID consult. Follow WBC  Cefazolin per ID consult for now.   This patient is IV drug abuser, she is not a candidate for outpatient IV therapy so we will have to keep in the hospital to finish her IV treatment.   As per orthopedic, if does not improve in next 2-3 days then may require incision and drainage of the joint.   Added IV Toradol to help with the joint pain as that have some effect on her inflammation.  2. Polysubstance abuse: admitted to stepdown due to withdrawal and agitation on Precedex Psychiatry consultation requested. Patient has IVC by ER physician Off Precedex drip,Calm and comfortable now.  3. Hypokalemia: Replete and check an a.m. Checked magnesium- normal.  4. Acute kidney injury: Start IV fluids Hold nephrotoxic medications Improved.  All the records are reviewed and case discussed with Care Management/Social Workerr. Management plans discussed with the patient, family and they are in agreement.  CODE STATUS: Full.  TOTAL TIME TAKING CARE OF THIS PATIENT: 35 minutes.    POSSIBLE D/C IN 3-4 DAYS, DEPENDING ON CLINICAL CONDITION.   [name] M.D on 12/20/2017   Between 7am to 6pm - Pager - [phone]  After 6pm go to [email or web address] - password [email or web address] [facility]  Office  [phone]  CC: Primary care physician; Patient, No Pcp Per  Note: This dictation was prepared with Dragon dictation along with smaller phrase technology. Any transcriptional errors that result from this process are unintentional.

## 2017-12-20 NOTE — Progress Notes (Signed)
Pharmacy Electrolyte Monitoring Consult:  Pharmacy consulted to assist in monitoring and replacing electrolytes in this 37 y.o. female admitted on 12/15/2017 with septic arthritis and MSSA bacteremia.   Labs:  Sodium (mmol/L)  Date Value  12/20/2017 136  12/14/2014 134 (L)   Potassium (mmol/L)  Date Value  12/20/2017 4.2  12/14/2014 3.6   Magnesium (mg/dL)  Date Value  30/86/578402/05/2018 1.8   Phosphorus (mg/dL)  Date Value  69/62/952802/05/2018 3.9   Calcium (mg/dL)  Date Value  41/32/440102/05/2018 8.9   Calcium, Total (mg/dL)  Date Value  02/72/536602/11/2014 8.6   Albumin (g/dL)  Date Value  44/03/474202/12/2017 3.7  12/14/2014 3.6    Plan: K 4.2, Mg 1.8, phos 3.9. No further supplement warranted at this time. Will recheck electrolytes with AM labs in 2 days.   Carola FrostNathan A Aidee Latimore, Pharm.D., BCPS Clinical Pharmacist 12/20/2017 8:38 AM

## 2017-12-20 NOTE — Progress Notes (Signed)
Left midline removed per order due to "zinging" and numbness in left hand.  Assessment with ultrasound for new midline limited by dressing.  Patient is due medication at 2200 and [facility] RN okay to wait until closer to that time to reassess with dressing off.  Gasper LloydKerry Alahna Dunne, RN

## 2017-12-20 NOTE — Progress Notes (Signed)
  Subjective:  Patient reports pain as moderate in the right shoulder  Objective:   VITALS:   Vitals:   12/19/17 0459 12/19/17 1326 12/19/17 2041 12/20/17 0514  BP: (!) 147/85 137/66 133/83 (!) 154/70  Pulse: 95 89 96 93  Resp: 12 18 18 18   Temp: 99.1 F (37.3 C) 98.9 F (37.2 C) 99.5 F (37.5 C) 98.9 F (37.2 C)  TempSrc: Oral Oral Oral Oral  SpO2: 98% 99% 97% 95%  Weight:      Height:        PHYSICAL EXAM:  Sensation intact distally erythema and warmth over AC joint, +tenderness  LABS  Results for orders placed or performed during the hospital encounter of 12/15/17 (from the past 24 hour(s))  Basic metabolic panel     Status: Abnormal   Collection Time: 12/20/17  7:41 AM  Result Value Ref Range   Sodium 136 135 - 145 mmol/L   Potassium 4.2 3.5 - 5.1 mmol/L   Chloride 101 101 - 111 mmol/L   CO2 24 22 - 32 mmol/L   Glucose, Bld 107 (H) 65 - 99 mg/dL   BUN 10 6 - 20 mg/dL   Creatinine, Ser 0.450.90 0.44 - 1.00 mg/dL   Calcium 8.9 8.9 - 40.910.3 mg/dL   GFR calc non Af Amer >60 >60 mL/min   GFR calc Af Amer >60 >60 mL/min   Anion gap 11 5 - 15  Magnesium     Status: None   Collection Time: 12/20/17  7:41 AM  Result Value Ref Range   Magnesium 1.8 1.7 - 2.4 mg/dL  Phosphorus     Status: None   Collection Time: 12/20/17  7:41 AM  Result Value Ref Range   Phosphorus 3.9 2.5 - 4.6 mg/dL    No results found.  Assessment/Plan:     Active Problems:   Septic joint (HCC)   Patient continues to have significant pain and swelling. Will continue to monitor closely. She may benefit from open irrigation and debridement on Monday if her symptoms do not improve.    Lyndle HerrlichJames R Bowers , MD 12/20/2017, 10:38 AM

## 2017-12-20 NOTE — Consult Note (Signed)
Psychiatry: Brief follow-up.  Stopped by to see how she was doing.  Patient had no complaints.  She said she was feeling better and her arm was not hurting as much.  Denied feeling depressed.  Denied feeling hopeless.  Denied suicidal thoughts.  Patient appeared to be lucid and appropriate at least as much as she probably ever is.  She was neatly dressed and appeared to be cooperative and basically understand her condition.  Supportive counseling briefly.  No change to any medicine.  No need to resume suicide precautions.

## 2017-12-21 LAB — BASIC METABOLIC PANEL
Anion gap: 5 (ref 5–15)
BUN: 22 mg/dL — ABNORMAL HIGH (ref 6–20)
CALCIUM: 9.2 mg/dL (ref 8.9–10.3)
CHLORIDE: 103 mmol/L (ref 101–111)
CO2: 31 mmol/L (ref 22–32)
Creatinine, Ser: 1.01 mg/dL — ABNORMAL HIGH (ref 0.44–1.00)
Glucose, Bld: 102 mg/dL — ABNORMAL HIGH (ref 65–99)
Potassium: 4.3 mmol/L (ref 3.5–5.1)
SODIUM: 139 mmol/L (ref 135–145)

## 2017-12-21 LAB — CBC
HCT: 29.5 % — ABNORMAL LOW (ref 35.0–47.0)
HEMOGLOBIN: 9.6 g/dL — AB (ref 12.0–16.0)
MCH: 28.4 pg (ref 26.0–34.0)
MCHC: 32.5 g/dL (ref 32.0–36.0)
MCV: 87.3 fL (ref 80.0–100.0)
PLATELETS: 379 10*3/uL (ref 150–440)
RBC: 3.38 MIL/uL — ABNORMAL LOW (ref 3.80–5.20)
RDW: 16.1 % — ABNORMAL HIGH (ref 11.5–14.5)
WBC: 7.8 10*3/uL (ref 3.6–11.0)

## 2017-12-21 LAB — CULTURE, BLOOD (ROUTINE X 2)
CULTURE: NO GROWTH
SPECIAL REQUESTS: ADEQUATE

## 2017-12-21 NOTE — Plan of Care (Signed)
  Progressing Education: Knowledge of General Education information will improve 12/21/2017 1608 - Progressing by Burnett KanarisPerez, Sukhdeep Wieting N, RN Health Behavior/Discharge Planning: Ability to manage health-related needs will improve 12/21/2017 1608 - Progressing by Burnett KanarisPerez, Evea Sheek N, RN Clinical Measurements: Ability to maintain clinical measurements within normal limits will improve 12/21/2017 1608 - Progressing by Burnett KanarisPerez, Nafis Farnan N, RN Will remain free from infection 12/21/2017 1608 - Progressing by Burnett KanarisPerez, Tiney Zipper N, RN Diagnostic test results will improve 12/21/2017 1608 - Progressing by Burnett KanarisPerez, Soniyah Mcglory N, RN Respiratory complications will improve 12/21/2017 1608 - Progressing by Burnett KanarisPerez, Nabiha Planck N, RN Cardiovascular complication will be avoided 12/21/2017 1608 - Progressing by Burnett KanarisPerez, Bronislaus Verdell N, RN Activity: Risk for activity intolerance will decrease 12/21/2017 1608 - Progressing by Burnett KanarisPerez, Crystallee Werden N, RN Nutrition: Adequate nutrition will be maintained 12/21/2017 1608 - Progressing by Burnett KanarisPerez, Sheketa Ende N, RN Coping: Level of anxiety will decrease 12/21/2017 1608 - Progressing by Burnett KanarisPerez, Kiyaan Haq N, RN Elimination: Will not experience complications related to bowel motility 12/21/2017 1608 - Progressing by Burnett KanarisPerez, Patryce Depriest N, RN Will not experience complications related to urinary retention 12/21/2017 1608 - Progressing by Burnett KanarisPerez, Johnothan Bascomb N, RN Pain Managment: General experience of comfort will improve 12/21/2017 1608 - Progressing by Burnett KanarisPerez, Detavious Rinn N, RN Safety: Ability to remain free from injury will improve 12/21/2017 1608 - Progressing by Burnett KanarisPerez, Roemello Speyer N, RN Skin Integrity: Risk for impaired skin integrity will decrease 12/21/2017 1608 - Progressing by Burnett KanarisPerez, Demeisha Geraghty N, RN

## 2017-12-21 NOTE — Progress Notes (Signed)
Pharmacy Electrolyte Monitoring Consult:  Pharmacy consulted to assist in monitoring and replacing electrolytes in this 37 y.o. female admitted on 12/15/2017 with septic arthritis and MSSA bacteremia.   Labs:  Sodium (mmol/L)  Date Value  12/21/2017 139  12/14/2014 134 (L)   Potassium (mmol/L)  Date Value  12/21/2017 4.3  12/14/2014 3.6   Magnesium (mg/dL)  Date Value  16/10/960402/05/2018 1.8   Phosphorus (mg/dL)  Date Value  54/09/811902/05/2018 3.9   Calcium (mg/dL)  Date Value  14/78/295602/06/2018 9.2   Calcium, Total (mg/dL)  Date Value  21/30/865702/11/2014 8.6   Albumin (g/dL)  Date Value  84/69/629502/12/2017 3.7  12/14/2014 3.6    Plan: 12/21/17 no new labs. Will plan to check electrolytes tomorrow with AM labs.   Carola FrostNathan A Tassie Pollett, Pharm.D., BCPS Clinical Pharmacist 12/21/2017 3:03 PM

## 2017-12-21 NOTE — Progress Notes (Signed)
[facility] NAME: [name]    MR#:  [id number]  DATE OF BIRTH:  Aug 19, 1981  SUBJECTIVE:  CHIEF COMPLAINT:   Chief Complaint  Patient presents with  . Shoulder Pain     History of drug abuse, came with right shoulder pain, noted to have septic shoulder joint.  In drug withdrawal, agitation and started on Precedex , improved and off Precedex now, alert and oriented. C/o shoulder pain still, s/p aspiration of the joint by ortho.  REVIEW OF SYSTEMS:     Review of Systems  Constitutional: Negative for fever, malaise/fatigue and weight loss.  HENT: Negative for congestion, ear discharge and hearing loss.   Eyes: Negative for blurred vision and redness.  Respiratory: Negative for cough, sputum production, shortness of breath, wheezing and stridor.   Cardiovascular: Negative for chest pain, palpitations and orthopnea.  Gastrointestinal: Negative for abdominal pain, diarrhea, heartburn and vomiting.  Genitourinary: Negative for dysuria and frequency.  Musculoskeletal: Positive for joint pain (right shoulder pain). Negative for falls.  Skin: Negative for rash.  Neurological: Negative for dizziness, tremors and focal weakness.  Psychiatric/Behavioral: Positive for substance abuse. Negative for depression and suicidal ideas.    DRUG ALLERGIES:   Allergies  Allergen Reactions  . Amoxicillin Rash  . Latex   . Latex   . Vancomycin   . Vancomycin   . Amoxicillin Rash  . Clindamycin/Lincomycin Rash  . Clindamycin/Lincomycin Rash    VITALS:  Blood pressure 136/78, pulse 94, temperature 98.2 F (36.8 C), temperature source Oral, resp. rate 12, height 5' 7"  (1.702 m), weight 57 kg (125 lb 10.6 oz), SpO2 94 %.  PHYSICAL EXAMINATION:  GENERAL:  37 y.o.-year-old patient lying in the bed with no acute distress.  EYES: Pupils equal, round, reactive to light and accommodation. No scleral icterus. Extraocular muscles intact.  HEENT:  Head atraumatic, normocephalic. Oropharynx and nasopharynx clear.  NECK:  Supple, no jugular venous distention. No thyroid enlargement, no tenderness.  LUNGS: Normal breath sounds bilaterally, no wheezing, rales,rhonchi or crepitation. No use of accessory muscles of respiration.  CARDIOVASCULAR: S1, S2 normal. No murmurs, rubs, or gallops.  ABDOMEN: Soft, nontender, nondistended. Bowel sounds present. No organomegaly or mass.  EXTREMITIES: No pedal edema, cyanosis, or clubbing. Right shoulder tender. NEUROLOGIC: Cranial nerves II through XII are intact. No focal weakness, moves all 4 limbs- limited on RUL due to shoulder pain, sensation intact. PSYCHIATRIC: The patient is alert and oriented 3.  SKIN: No obvious rash, lesion, or ulcer.   Physical Exam LABORATORY PANEL:   CBC Recent Labs  Lab 12/21/17 0618  WBC 7.8  HGB 9.6*  HCT 29.5*  PLT 379   ------------------------------------------------------------------------------------------------------------------  Chemistries  Recent Labs  Lab 12/15/17 1522  12/20/17 0741 12/21/17 0618  NA 133*   < > 136 139  K 2.9*   < > 4.2 4.3  CL 99*   < > 101 103  CO2 20*   < > 24 31  GLUCOSE 122*   < > 107* 102*  BUN 49*   < > 10 22*  CREATININE 2.73*   < > 0.90 1.01*  CALCIUM 9.0   < > 8.9 9.2  MG 2.6*   < > 1.8  --   AST 45*  --   --   --   ALT 23  --   --   --   ALKPHOS 121  --   --   --   BILITOT 0.8  --   --   --    < > =  values in this interval not displayed.   ------------------------------------------------------------------------------------------------------------------  Cardiac Enzymes Recent Labs  Lab 12/16/17 0729  TROPONINI <0.03   ------------------------------------------------------------------------------------------------------------------  RADIOLOGY:  No results found.  ASSESSMENT AND PLAN:   Active Problems:   Septic joint ([place])  1. Right shoulder pain with erythema concerning for septic joint     Gram-positive bacteremia- MSSA Orthopedic surgery consulted , s/p aspiration of Rt shoulder, reviewed cultures. ID consultation appreciated High ESR and CRP Echocardiogram to evaluate for endocarditis given history of IV drug use- no vegetations. Further recommendations after ortho and ID consult. Follow WBC  Cefazolin per ID consult for now.  This patient is IV drug abuser, she is not a candidate for outpatient IV therapy so we will have to keep in the hospital to finish her IV treatment till 12/30/17. Then can switch to oral.   As per orthopedic, if does not improve in next 2-3 days then may require incision and drainage of the joint.   Added IV Toradol to help with the joint pain as that have some effect on her inflammation.  2. Polysubstance abuse: admitted to stepdown due to withdrawal and agitation on Precedex. Psychiatry consultation appreciated, she was IVC by ER physician but psych physician discontinued that.  Off Precedex drip,Calm and comfortable now.  Because of her history of drug abuse and having PICC line and friends visiting her in the room, I would like to check urine for toxicology before she goes for surgery on Monday.  3. Hypokalemia: Replete and check an a.m. Checked magnesium- normal.  4. Acute kidney injury: Start IV fluids Hold nephrotoxic medications Improved.  All the records are reviewed and case discussed with Care Management/Social Workerr. Management plans discussed with the patient, family and they are in agreement.  CODE STATUS: Full.  TOTAL TIME TAKING CARE OF THIS PATIENT: 35 minutes.    POSSIBLE D/C IN 3-4 DAYS, DEPENDING ON CLINICAL CONDITION.   [name] M.D on 12/21/2017   Between 7am to 6pm - Pager - [phone]  After 6pm go to [email or web address] - password [email or web address] [facility]  Office  [phone]  CC: Primary care physician; Patient, No Pcp Per  Note: This dictation was prepared with Dragon dictation  along with smaller phrase technology. Any transcriptional errors that result from this process are unintentional.

## 2017-12-21 NOTE — Progress Notes (Signed)
[facility] INFECTIOUS DISEASE PROGRESS NOTE Date of Admission:  12/15/2017     ID: [name] is a 37 y.o. female with  MSSA bacteremia and Shoulder septic joint Active Problems:   Septic joint ([place])   Subjective: No fevers, still with pain. More alert, friend at bedside  ROS  Eleven systems are reviewed and negative except per hpi  Medications:  Antibiotics Given (last 72 hours)    Date/Time Action Medication Dose Rate   12/18/17 2146 New Bag/Given   ceFAZolin (ANCEF) IVPB 2g/100 mL premix 2 g 200 mL/hr   12/19/17 0649 New Bag/Given   ceFAZolin (ANCEF) IVPB 2g/100 mL premix 2 g 200 mL/hr   12/19/17 1503 New Bag/Given   ceFAZolin (ANCEF) IVPB 2g/100 mL premix 2 g 200 mL/hr   12/19/17 2322 New Bag/Given   ceFAZolin (ANCEF) IVPB 2g/100 mL premix 2 g 200 mL/hr   12/20/17 0645 New Bag/Given   ceFAZolin (ANCEF) IVPB 2g/100 mL premix 2 g 200 mL/hr   12/20/17 1341 New Bag/Given   ceFAZolin (ANCEF) IVPB 2g/100 mL premix 2 g 200 mL/hr   12/20/17 2223 New Bag/Given   ceFAZolin (ANCEF) IVPB 2g/100 mL premix 2 g 200 mL/hr   12/21/17 3825 New Bag/Given   ceFAZolin (ANCEF) IVPB 2g/100 mL premix 2 g 200 mL/hr   12/21/17 1403 New Bag/Given   ceFAZolin (ANCEF) IVPB 2g/100 mL premix 2 g 200 mL/hr     . cloNIDine  0.2 mg Transdermal Weekly  . docusate sodium  100 mg Oral Daily  . enoxaparin (LOVENOX) injection  40 mg Subcutaneous Q24H  . ketorolac  15 mg Intravenous Q6H  . nicotine  21 mg Transdermal Daily  . sodium chloride flush  10-40 mL Intracatheter Q12H    Objective: Vital signs in last 24 hours: Temp:  [98.2 F (36.8 C)-98.7 F (37.1 C)] 98.7 F (37.1 C) (02/08 1428) Pulse Rate:  [91-106] 91 (02/08 1428) Resp:  [12-18] 18 (02/08 1428) BP: (136-154)/(78-86) 148/85 (02/08 1428) SpO2:  [94 %-100 %] 100 % (02/08 1428) Constitutional: disheveled, in some pain  HENT: [place]/AT, PERRLA, no scleral icterus periorbital ecchymoses  Mouth/Throat: Oropharynx is clear and  dry.   Cardiovascular: tachy, 2/6 sm  Pulmonary/Chest: Effort normal and breath sounds normal. No respiratory distress.  has no wheezes.  Neck = supple, no nuchal rigidity Abdominal: Soft. Bowel sounds are normal.  exhibits no distension. There is no tenderness.  Lymphadenopathy: no cervical adenopathy. No axillary adenopathy Neurological: alert interactive.  Ext - R shoulder with limited rom due to pain, mod erythema surrounding it, receding from line draw previously Skin: no rash. Psychiatric: alert, flat affect    Lab Results Recent Labs    12/20/17 0741 12/21/17 0618  WBC  --  7.8  HGB  --  9.6*  HCT  --  29.5*  NA 136 139  K 4.2 4.3  CL 101 103  CO2 24 31  BUN 10 22*  CREATININE 0.90 1.01*    Microbiology: Results for orders placed or performed during the hospital encounter of 12/15/17  Blood culture (single)     Status: Abnormal   Collection Time: 12/15/17  3:47 PM  Result Value Ref Range Status   Specimen Description   Final    BLOOD LEFT FOREARM Performed at [facility], [place] [address], [place], [place] [address]    Special Requests   Final    BOTTLES DRAWN AEROBIC AND ANAEROBIC Blood Culture adequate volume Performed at [facility], [address] [place], [place] [address]    Culture  Setup Time   Final    IN BOTH AEROBIC AND ANAEROBIC BOTTLES GRAM POSITIVE COCCI CRITICAL RESULT CALLED TO, READ BACK BY AND VERIFIED WITH: HANK ZOMPA AT 0915 ON 12/16/17 BY SNJ Performed at [facility], [place] [address], [place], [place] [address]    Culture STAPHYLOCOCCUS AUREUS (A)  Final   Report Status 12/18/2017 FINAL  Final   Organism ID, Bacteria STAPHYLOCOCCUS AUREUS  Final      Susceptibility   Staphylococcus aureus - MIC*    CIPROFLOXACIN <=0.5 SENSITIVE Sensitive     ERYTHROMYCIN <=0.25 SENSITIVE Sensitive     GENTAMICIN <=0.5 SENSITIVE Sensitive     OXACILLIN <=0.25 SENSITIVE Sensitive     TETRACYCLINE <=1 SENSITIVE Sensitive      VANCOMYCIN 1 SENSITIVE Sensitive     TRIMETH/SULFA <=10 SENSITIVE Sensitive     CLINDAMYCIN <=0.25 SENSITIVE Sensitive     RIFAMPIN <=0.5 SENSITIVE Sensitive     Inducible Clindamycin NEGATIVE Sensitive     * STAPHYLOCOCCUS AUREUS  Blood Culture ID Panel (Reflexed)     Status: Abnormal   Collection Time: 12/15/17  3:47 PM  Result Value Ref Range Status   Enterococcus species NOT DETECTED NOT DETECTED Final   Listeria monocytogenes NOT DETECTED NOT DETECTED Final   Staphylococcus species DETECTED (A) NOT DETECTED Final    Comment: CRITICAL RESULT CALLED TO, READ BACK BY AND VERIFIED WITH: HANK ZOMPA AT 0915 ON 12/16/17 BY SNJ    Staphylococcus aureus DETECTED (A) NOT DETECTED Final    Comment: Methicillin (oxacillin) susceptible Staphylococcus aureus (MSSA). Preferred therapy is anti staphylococcal beta lactam antibiotic (Cefazolin or Nafcillin), unless clinically contraindicated. CRITICAL RESULT CALLED TO, READ BACK BY AND VERIFIED WITH: HANK ZOMPA AT 0915 ON 12/16/17 BY SNJ    Methicillin resistance NOT DETECTED NOT DETECTED Final   Streptococcus species NOT DETECTED NOT DETECTED Final   Streptococcus agalactiae NOT DETECTED NOT DETECTED Final   Streptococcus pneumoniae NOT DETECTED NOT DETECTED Final   Streptococcus pyogenes NOT DETECTED NOT DETECTED Final   Acinetobacter baumannii NOT DETECTED NOT DETECTED Final   Enterobacteriaceae species NOT DETECTED NOT DETECTED Final   Enterobacter cloacae complex NOT DETECTED NOT DETECTED Final   Escherichia coli NOT DETECTED NOT DETECTED Final   Klebsiella oxytoca NOT DETECTED NOT DETECTED Final   Klebsiella pneumoniae NOT DETECTED NOT DETECTED Final   Proteus species NOT DETECTED NOT DETECTED Final   Serratia marcescens NOT DETECTED NOT DETECTED Final   Haemophilus influenzae NOT DETECTED NOT DETECTED Final   Neisseria meningitidis NOT DETECTED NOT DETECTED Final   Pseudomonas aeruginosa NOT DETECTED NOT DETECTED Final   Candida albicans  NOT DETECTED NOT DETECTED Final   Candida glabrata NOT DETECTED NOT DETECTED Final   Candida krusei NOT DETECTED NOT DETECTED Final   Candida parapsilosis NOT DETECTED NOT DETECTED Final   Candida tropicalis NOT DETECTED NOT DETECTED Final    Comment: Performed at [facility], [address] [place], [place] [address]  MRSA PCR Screening     Status: None   Collection Time: 12/15/17  8:27 PM  Result Value Ref Range Status   MRSA by PCR NEGATIVE NEGATIVE Final    Comment:        The GeneXpert MRSA Assay (FDA approved for NASAL specimens only), is one component of a comprehensive MRSA colonization surveillance program. It is not intended to diagnose MRSA infection nor to guide or monitor treatment for MRSA infections. Performed  at [facility], [address] [place], [place] [address]   Culture, blood (Routine X 2) w Reflex to ID Panel     Status: None   Collection Time: 12/16/17 12:11 PM  Result Value Ref Range Status   Specimen Description BLOOD RT HAND  Final   Special Requests   Final    BOTTLES DRAWN AEROBIC AND ANAEROBIC Blood Culture adequate volume   Culture   Final    NO GROWTH 5 DAYS Performed at [facility], [address], [place], [place] [address]    Report Status 12/21/2017 FINAL  Final  Body fluid culture     Status: None   Collection Time: 12/17/17  2:06 PM  Result Value Ref Range Status   Specimen Description   Final    SHOULDER Performed at [facility], [address] [place], [place] [address]    Special Requests   Final    RIGHT SHOULDER FLD Performed at [facility], [address], [place], [place] [address]    Gram Stain   Final    ABUNDANT WBC PRESENT,BOTH PMN AND MONONUCLEAR MODERATE GRAM POSITIVE COCCI Performed at [facility], [place] [address], [place], [place] [address]    Culture ABUNDANT STAPHYLOCOCCUS AUREUS  Final   Report Status 12/20/2017 FINAL  Final   Organism ID,  Bacteria STAPHYLOCOCCUS AUREUS  Final      Susceptibility   Staphylococcus aureus - MIC*    CIPROFLOXACIN <=0.5 SENSITIVE Sensitive     ERYTHROMYCIN <=0.25 SENSITIVE Sensitive     GENTAMICIN <=0.5 SENSITIVE Sensitive     OXACILLIN <=0.25 SENSITIVE Sensitive     TETRACYCLINE <=1 SENSITIVE Sensitive     VANCOMYCIN 1 SENSITIVE Sensitive     TRIMETH/SULFA <=10 SENSITIVE Sensitive     CLINDAMYCIN <=0.25 SENSITIVE Sensitive     RIFAMPIN <=0.5 SENSITIVE Sensitive     Inducible Clindamycin NEGATIVE Sensitive     * ABUNDANT STAPHYLOCOCCUS AUREUS    Studies/Results: No results found.  Assessment/Plan: [name] is a 37 y.o. female admitted with R shoulder pain and agitation. She was found to have cellulitis of the shoulder and CT showed likely septic arthritis of AC joint. BCX + MSSA.  On admit had ARF with cr 2.7, CRp 27, ESR 65, wbc 19.  HIV neg, tox Screen + amphetamines, cocaine and opiates.  She has a history of HCV, possible previous endocarditis as well as IV drug use. She has had multiple admissions over the last year.  Echo neg for vegetations.  FU BCX 2/3 is Neg S/p aspiration R shoukler 2/4 with purulent material obtained- cx with abundant staph.  Renal function improving.    Recommendations Cont ancef at dosing for MSSA bacteremia She will need a min 2 week course of IV ancef and given her hx she is not a candidate for home IV therapy.  If possible could go to a SNF but that is usually not a possibility so will likely need to remain here for the full 2 weeks. Stop date 12/30/17 Will then need another 4 weeks of oral keflex 500 mg TID.  Agree with ortho that the aspiration and IV abx may be help avoid open wash out but will watch for now.    Thank you very much for the consult. Will follow with you.  Leonel Ramsay   12/21/2017, 3:01 PM

## 2017-12-22 LAB — BASIC METABOLIC PANEL
Anion gap: 10 (ref 5–15)
BUN: 24 mg/dL — ABNORMAL HIGH (ref 6–20)
CHLORIDE: 100 mmol/L — AB (ref 101–111)
CO2: 25 mmol/L (ref 22–32)
Calcium: 9 mg/dL (ref 8.9–10.3)
Creatinine, Ser: 1.2 mg/dL — ABNORMAL HIGH (ref 0.44–1.00)
GFR, EST NON AFRICAN AMERICAN: 58 mL/min — AB (ref >60)
Glucose, Bld: 104 mg/dL — ABNORMAL HIGH (ref 65–99)
Potassium: 4.7 mmol/L (ref 3.5–5.1)
SODIUM: 135 mmol/L (ref 135–145)

## 2017-12-22 LAB — URINE DRUG SCREEN, QUALITATIVE (ARMC ONLY)
AMPHETAMINES, UR SCREEN: NOT DETECTED
Barbiturates, Ur Screen: NOT DETECTED
Benzodiazepine, Ur Scrn: NOT DETECTED
COCAINE METABOLITE, UR ~~LOC~~: POSITIVE — AB
Cannabinoid 50 Ng, Ur ~~LOC~~: NOT DETECTED
MDMA (ECSTASY) UR SCREEN: NOT DETECTED
METHADONE SCREEN, URINE: NOT DETECTED
OPIATE, UR SCREEN: POSITIVE — AB
Phencyclidine (PCP) Ur S: NOT DETECTED
TRICYCLIC, UR SCREEN: NOT DETECTED

## 2017-12-22 LAB — MAGNESIUM: MAGNESIUM: 1.9 mg/dL (ref 1.7–2.4)

## 2017-12-22 LAB — URINALYSIS, COMPLETE (UACMP) WITH MICROSCOPIC
BILIRUBIN URINE: NEGATIVE
Glucose, UA: NEGATIVE mg/dL
Hgb urine dipstick: NEGATIVE
Ketones, ur: NEGATIVE mg/dL
LEUKOCYTES UA: NEGATIVE
Nitrite: NEGATIVE
PH: 5 (ref 5.0–8.0)
Protein, ur: NEGATIVE mg/dL
RBC / HPF: NONE SEEN RBC/hpf (ref 0–5)
SPECIFIC GRAVITY, URINE: 1.003 — AB (ref 1.005–1.030)
WBC, UA: NONE SEEN WBC/hpf (ref 0–5)

## 2017-12-22 LAB — PHOSPHORUS: PHOSPHORUS: 4.5 mg/dL (ref 2.5–4.6)

## 2017-12-22 MED ORDER — SUMATRIPTAN SUCCINATE 50 MG PO TABS
50.0000 mg | ORAL_TABLET | ORAL | Status: DC | PRN
  Administered 2017-12-22: 50 mg via ORAL
  Filled 2017-12-22 (×2): qty 1

## 2017-12-22 MED ORDER — AMLODIPINE BESYLATE 5 MG PO TABS
5.0000 mg | ORAL_TABLET | Freq: Every day | ORAL | Status: DC
  Administered 2017-12-22 – 2017-12-30 (×9): 5 mg via ORAL
  Filled 2017-12-22 (×9): qty 1

## 2017-12-22 MED ORDER — ALUM & MAG HYDROXIDE-SIMETH 200-200-20 MG/5ML PO SUSP
30.0000 mL | ORAL | Status: DC | PRN
  Administered 2017-12-22 – 2017-12-28 (×2): 30 mL via ORAL
  Filled 2017-12-22 (×2): qty 30

## 2017-12-22 NOTE — Progress Notes (Signed)
[facility] - [place] at [facility]   PATIENT NAME: Samantha Semeniffany Arias    MR#:  [id number]  DATE OF BIRTH:  03/12/1981  SUBJECTIVE:  CHIEF COMPLAINT:   Chief Complaint  Patient presents with  . Shoulder Pain   - h/o drug abuse has MSSA bacteremia and right septic shoulder -Complains of some pain but improving. Blood pressure is elevated today  REVIEW OF SYSTEMS:  Review of Systems  Constitutional: Negative for chills, fever and malaise/fatigue.  HENT: Negative for ear discharge, hearing loss and nosebleeds.   Eyes: Negative for blurred vision and double vision.  Respiratory: Negative for cough, shortness of breath and wheezing.   Cardiovascular: Negative for chest pain, palpitations and leg swelling.  Gastrointestinal: Negative for abdominal pain, constipation, diarrhea, nausea and vomiting.  Genitourinary: Negative for dysuria.  Musculoskeletal: Positive for joint pain and myalgias.  Neurological: Negative for dizziness, speech change, focal weakness, seizures and headaches.  Psychiatric/Behavioral: Negative for depression.    DRUG ALLERGIES:   Allergies  Allergen Reactions  . Amoxicillin Rash  . Latex   . Latex   . Vancomycin   . Vancomycin   . Amoxicillin Rash  . Clindamycin/Lincomycin Rash  . Clindamycin/Lincomycin Rash    VITALS:  Blood pressure (!) 192/105, pulse (!) 135, temperature 98.4 F (36.9 C), temperature source Oral, resp. rate 12, height 5\' 7"  (1.702 m), weight 57 kg (125 lb 10.6 oz), SpO2 94 %.  PHYSICAL EXAMINATION:  Physical Exam  GENERAL:  37 y.o.-year-old patient lying in the bed with no acute distress.  EYES: Pupils equal, round, reactive to light and accommodation. No scleral icterus. Extraocular muscles intact.  HEENT: Head atraumatic, normocephalic. Oropharynx and nasopharynx clear.  NECK:  Supple, no jugular venous distention. No thyroid enlargement, no tenderness.  LUNGS: Normal breath sounds bilaterally, no wheezing,  rales,rhonchi or crepitation. No use of accessory muscles of respiration.  CARDIOVASCULAR: S1, S2 normal. No murmurs, rubs, or gallops.  ABDOMEN: Soft, nontender, nondistended. Bowel sounds present. No organomegaly or mass.  EXTREMITIES: Swelling anteriorly of the right shoulder with some redness and tenderness to touch. No pedal edema, cyanosis, or clubbing.  NEUROLOGIC: Cranial nerves II through XII are intact. Muscle strength 5/5 in all extremities. Sensation intact. Gait not checked.  PSYCHIATRIC: The patient is alert and oriented x 3.  SKIN: No obvious rash, lesion, or ulcer.    LABORATORY PANEL:   CBC Recent Labs  Lab 12/21/17 0618  WBC 7.8  HGB 9.6*  HCT 29.5*  PLT 379   ------------------------------------------------------------------------------------------------------------------  Chemistries  Recent Labs  Lab 12/15/17 1522  12/22/17 0506  NA 133*   < > 135  K 2.9*   < > 4.7  CL 99*   < > 100*  CO2 20*   < > 25  GLUCOSE 122*   < > 104*  BUN 49*   < > 24*  CREATININE 2.73*   < > 1.20*  CALCIUM 9.0   < > 9.0  MG 2.6*   < > 1.9  AST 45*  --   --   ALT 23  --   --   ALKPHOS 121  --   --   BILITOT 0.8  --   --    < > = values in this interval not displayed.   ------------------------------------------------------------------------------------------------------------------  Cardiac Enzymes Recent Labs  Lab 12/16/17 0729  TROPONINI <0.03   ------------------------------------------------------------------------------------------------------------------  RADIOLOGY:  No results found.  EKG:   Orders placed or performed during the hospital  encounter of 09/15/17  . ED EKG  . ED EKG  . EKG 12-Lead  . EKG 12-Lead    ASSESSMENT AND PLAN:   37 year old female with past medical history significant for polysubstance abuse including IV drug abuse presents to hospital secondary to right shoulder pain  1. Sepsis-secondary to MSSA bacteremia and also right  shoulder septic joint -appreciate Ortho and ID consults - Patient had aspiration of the right shoulder. Echocardiogram was negative for any vegetations. -Plan was to see if she needed (intervention and cleaning of the right shoulder, however her pain seems to be improving at this point. -2 weeks of IV antibiotics with Ancef and then oral antibiotics after that. So she will receive IV antibiotics until 12/30/2017. -IV Toradol to help with joint pain which definitely is helping at this point  2 hypertension-check urine drug screen again as patient has visitors all through the day. -On clonidine patch and hydralazine when necessary -Added Norvasc  3. Polysubstance abuse-, and comfortable now. Received IV Precedex drip when she initially presented.  4. DVT prophylaxis-Lovenox   Patient is ambulatory    All the records are reviewed and case discussed with Care Management/Social Workerr. Management plans discussed with the patient, family and they are in agreement.  CODE STATUS: Full code  TOTAL TIME TAKING CARE OF THIS PATIENT: 38 minutes.   POSSIBLE D/C IN 7-8 DAYS, DEPENDING ON CLINICAL CONDITION.   Gabbi Whetstone M.D on 12/22/2017 at 2:11 PM  Between 7am to 6pm - Pager - [phone]  After 6pm go to [email or web address] - password [email or web address]  [facility]  Office  [phone]  CC: Primary care physician; Patient, No Pcp Per

## 2017-12-22 NOTE — Progress Notes (Signed)
ANTIBIOTIC CONSULT NOTE -follow up  Pharmacy Consult for cefazolin Indication: MSSA bacteremia  Allergies  Allergen Reactions  . Amoxicillin Rash  . Latex   . Latex   . Vancomycin   . Vancomycin   . Amoxicillin Rash  . Clindamycin/Lincomycin Rash  . Clindamycin/Lincomycin Rash    Patient Measurements: Height: 5\' 7"  (170.2 cm) Weight: 125 lb 10.6 oz (57 kg) IBW/kg (Calculated) : 61.6 Adjusted Body Weight:   Vital Signs: Temp: 98.4 F (36.9 C) (02/09 0341) Temp Source: Oral (02/09 0341) BP: 172/77 (02/09 0341) Pulse Rate: 101 (02/09 0341) Intake/Output from previous day: 02/08 0701 - 02/09 0700 In: 480 [P.O.:480] Out: -  Intake/Output from this shift: No intake/output data recorded.  Labs: Recent Labs    12/20/17 0741 12/21/17 0618 12/22/17 0506  WBC  --  7.8  --   HGB  --  9.6*  --   PLT  --  379  --   CREATININE 0.90 1.01* 1.20*   Estimated Creatinine Clearance: 58.9 mL/min (A) (by C-G formula based on SCr of 1.2 mg/dL (H)). No results for input(s): VANCOTROUGH, VANCOPEAK, VANCORANDOM, GENTTROUGH, GENTPEAK, GENTRANDOM, TOBRATROUGH, TOBRAPEAK, TOBRARND, AMIKACINPEAK, AMIKACINTROU, AMIKACIN in the last 72 hours.   Microbiology: Recent Results (from the past 720 hour(s))  Blood culture (single)     Status: Abnormal   Collection Time: 12/15/17  3:47 PM  Result Value Ref Range Status   Specimen Description   Final    BLOOD LEFT FOREARM Performed at [facility], [address] [place], [place] [address]    Special Requests   Final    BOTTLES DRAWN AEROBIC AND ANAEROBIC Blood Culture adequate volume Performed at [facility], [address] [place], [place] [address]    Culture  Setup Time   Final    IN BOTH AEROBIC AND ANAEROBIC BOTTLES GRAM POSITIVE COCCI CRITICAL RESULT CALLED TO, READ BACK BY AND VERIFIED WITH: HANK ZOMPA AT 0915 ON 12/16/17 BY SNJ Performed at [facility], [address] [place], [place] [address]    Culture  STAPHYLOCOCCUS AUREUS (A)  Final   Report Status 12/18/2017 FINAL  Final   Organism ID, Bacteria STAPHYLOCOCCUS AUREUS  Final      Susceptibility   Staphylococcus aureus - MIC*    CIPROFLOXACIN <=0.5 SENSITIVE Sensitive     ERYTHROMYCIN <=0.25 SENSITIVE Sensitive     GENTAMICIN <=0.5 SENSITIVE Sensitive     OXACILLIN <=0.25 SENSITIVE Sensitive     TETRACYCLINE <=1 SENSITIVE Sensitive     VANCOMYCIN 1 SENSITIVE Sensitive     TRIMETH/SULFA <=10 SENSITIVE Sensitive     CLINDAMYCIN <=0.25 SENSITIVE Sensitive     RIFAMPIN <=0.5 SENSITIVE Sensitive     Inducible Clindamycin NEGATIVE Sensitive     * STAPHYLOCOCCUS AUREUS  Blood Culture ID Panel (Reflexed)     Status: Abnormal   Collection Time: 12/15/17  3:47 PM  Result Value Ref Range Status   Enterococcus species NOT DETECTED NOT DETECTED Final   Listeria monocytogenes NOT DETECTED NOT DETECTED Final   Staphylococcus species DETECTED (A) NOT DETECTED Final    Comment: CRITICAL RESULT CALLED TO, READ BACK BY AND VERIFIED WITH: HANK ZOMPA AT 0915 ON 12/16/17 BY SNJ    Staphylococcus aureus DETECTED (A) NOT DETECTED Final    Comment: Methicillin (oxacillin) susceptible Staphylococcus aureus (MSSA). Preferred therapy is anti staphylococcal beta lactam antibiotic (Cefazolin or Nafcillin), unless clinically contraindicated. CRITICAL RESULT CALLED TO, READ BACK BY AND VERIFIED WITH: HANK ZOMPA AT 0915 ON  12/16/17 BY SNJ    Methicillin resistance NOT DETECTED NOT DETECTED Final   Streptococcus species NOT DETECTED NOT DETECTED Final   Streptococcus agalactiae NOT DETECTED NOT DETECTED Final   Streptococcus pneumoniae NOT DETECTED NOT DETECTED Final   Streptococcus pyogenes NOT DETECTED NOT DETECTED Final   Acinetobacter baumannii NOT DETECTED NOT DETECTED Final   Enterobacteriaceae species NOT DETECTED NOT DETECTED Final   Enterobacter cloacae complex NOT DETECTED NOT DETECTED Final   Escherichia coli NOT DETECTED NOT DETECTED Final    Klebsiella oxytoca NOT DETECTED NOT DETECTED Final   Klebsiella pneumoniae NOT DETECTED NOT DETECTED Final   Proteus species NOT DETECTED NOT DETECTED Final   Serratia marcescens NOT DETECTED NOT DETECTED Final   Haemophilus influenzae NOT DETECTED NOT DETECTED Final   Neisseria meningitidis NOT DETECTED NOT DETECTED Final   Pseudomonas aeruginosa NOT DETECTED NOT DETECTED Final   Candida albicans NOT DETECTED NOT DETECTED Final   Candida glabrata NOT DETECTED NOT DETECTED Final   Candida krusei NOT DETECTED NOT DETECTED Final   Candida parapsilosis NOT DETECTED NOT DETECTED Final   Candida tropicalis NOT DETECTED NOT DETECTED Final    Comment: Performed at [facility], [address] [place], [place] [address]  MRSA PCR Screening     Status: None   Collection Time: 12/15/17  8:27 PM  Result Value Ref Range Status   MRSA by PCR NEGATIVE NEGATIVE Final    Comment:        The GeneXpert MRSA Assay (FDA approved for NASAL specimens only), is one component of a comprehensive MRSA colonization surveillance program. It is not intended to diagnose MRSA infection nor to guide or monitor treatment for MRSA infections. Performed at [facility], [address] [place], [place] [address]   Culture, blood (Routine X 2) w Reflex to ID Panel     Status: None   Collection Time: 12/16/17 12:11 PM  Result Value Ref Range Status   Specimen Description BLOOD RT HAND  Final   Special Requests   Final    BOTTLES DRAWN AEROBIC AND ANAEROBIC Blood Culture adequate volume   Culture   Final    NO GROWTH 5 DAYS Performed at [facility], [address], [place], [place] [address]    Report Status 12/21/2017 FINAL  Final  Body fluid culture     Status: None   Collection Time: 12/17/17  2:06 PM  Result Value Ref Range Status   Specimen Description   Final    SHOULDER Performed at [facility], [address] [place], [place] [address]    Special Requests    Final    RIGHT SHOULDER FLD Performed at [facility], [address], [place], [place] [address]    Gram Stain   Final    ABUNDANT WBC PRESENT,BOTH PMN AND MONONUCLEAR MODERATE GRAM POSITIVE COCCI Performed at [facility], [address] [place], [place] [address]    Culture ABUNDANT STAPHYLOCOCCUS AUREUS  Final   Report Status 12/20/2017 FINAL  Final   Organism ID, Bacteria STAPHYLOCOCCUS AUREUS  Final      Susceptibility   Staphylococcus aureus - MIC*    CIPROFLOXACIN <=0.5 SENSITIVE Sensitive     ERYTHROMYCIN <=0.25 SENSITIVE Sensitive     GENTAMICIN <=0.5 SENSITIVE Sensitive     OXACILLIN <=0.25 SENSITIVE Sensitive     TETRACYCLINE <=1 SENSITIVE Sensitive     VANCOMYCIN 1 SENSITIVE Sensitive     TRIMETH/SULFA <=10 SENSITIVE Sensitive  CLINDAMYCIN <=0.25 SENSITIVE Sensitive     RIFAMPIN <=0.5 SENSITIVE Sensitive     Inducible Clindamycin NEGATIVE Sensitive     * ABUNDANT STAPHYLOCOCCUS AUREUS    Medical History: Past Medical History:  Diagnosis Date  . Acute deep vein thrombosis of arm (HCC) 08/19/2014   Last Assessment & Plan:  - diagnosed 08/2014 in hospital - s/p heparin gtt - patient never took her xarelto x 3 month course that was planned - no symptoms today.  No further treatment - we discussed avoiding IVDU (see below) to reduce her chances of provoked upper extremity DVT   . Asthma   . HCV (hepatitis C virus) 07/17/2013   Last Assessment & Plan:  - positive antibody most recently 08/2014 - hep C RNA 08/2014 was negative - prior to that, HCV RNA 161096 in 06/2013. No record for genotype or imaging.   - patient counseled that she could become re-infected with continued IVDU or unprotected sexual encounters.   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   Last Assessment & Plan:  Discussed routine use of Valtrex prophylaxis at 36 weeks. She will be delivered by cesarean at 36-37 weeks.   . Infection  with methicillin-resistant Staphylococcus aureus 03/19/2013   Overview: Arm abscess with MRSA, treated.  Subsequent screening negative  . IV drug abuse (HCC)   . Tenosynovitis 08/19/2014    Medications:  Infusions:  .  ceFAZolin (ANCEF) IV Stopped (12/22/17 0601)   Assessment: 36 yof with MSSA bacteremia. Per ID: min 2 week course of IV ancef and given her hx she is not a candidate for home IV therapy d/t drug use. Stop date 12/30/17 She has a history of HCV, possible previous endocarditis as well as IV drug use.She has had multiple admissions over the last year  Goal of Therapy:  Resolve infection Prevent ADE  Plan:  Cefazolin 2 gm IV Q8H. Note, patient has a history of rash to amoxicillin. Has tolerated meropenem inpatient. Please monitor for reaction. Stop date 12/30/17   Ceasar Decandia A, Pharm.D., BCPS Clinical Pharmacist 12/22/2017,9:11 AM

## 2017-12-22 NOTE — Progress Notes (Signed)
Dr Nemiah CommanderKalisetti made aware that pt has increase BP and HR, PRN hydralazine IV to be given, new order for urine drug screen

## 2017-12-22 NOTE — Progress Notes (Signed)
Pharmacy Electrolyte Monitoring Consult:  Pharmacy consulted to assist in monitoring and replacing electrolytes in this 37 y.o. female admitted on 12/15/2017 with septic arthritis and MSSA bacteremia.   Labs:  Sodium (mmol/L)  Date Value  12/22/2017 135  12/14/2014 134 (L)   Potassium (mmol/L)  Date Value  12/22/2017 4.7  12/14/2014 3.6   Magnesium (mg/dL)  Date Value  32/44/010202/07/2018 1.9   Phosphorus (mg/dL)  Date Value  72/53/664402/07/2018 4.5   Calcium (mg/dL)  Date Value  03/47/425902/07/2018 9.0   Calcium, Total (mg/dL)  Date Value  56/38/756402/11/2014 8.6   Albumin (g/dL)  Date Value  33/29/518802/12/2017 3.7  12/14/2014 3.6    Plan: 12/22/17 electrolytes WNL. Will plan to check electrolytes in 2 days with AM labs.   Tawonna Esquer A, Pharm.D., BCPS Clinical Pharmacist 12/22/2017 9:07 AM

## 2017-12-22 NOTE — Plan of Care (Signed)
  Progressing Education: Knowledge of General Education information will improve 12/22/2017 1722 - Progressing by Burnett KanarisPerez, Harriet Bollen N, RN Health Behavior/Discharge Planning: Ability to manage health-related needs will improve 12/22/2017 1722 - Progressing by Burnett KanarisPerez, Heran Campau N, RN Clinical Measurements: Ability to maintain clinical measurements within normal limits will improve 12/22/2017 1722 - Progressing by Burnett KanarisPerez, Avaleigh Decuir N, RN Will remain free from infection 12/22/2017 1722 - Progressing by Burnett KanarisPerez, [place] Souter N, RN Diagnostic test results will improve 12/22/2017 1722 - Progressing by Burnett KanarisPerez, Cloe Sockwell N, RN Respiratory complications will improve 12/22/2017 1722 - Progressing by Burnett KanarisPerez, Libi Corso N, RN Cardiovascular complication will be avoided 12/22/2017 1722 - Progressing by Burnett KanarisPerez, Parlee Amescua N, RN Activity: Risk for activity intolerance will decrease 12/22/2017 1722 - Progressing by Burnett KanarisPerez, Lysandra Loughmiller N, RN Nutrition: Adequate nutrition will be maintained 12/22/2017 1722 - Progressing by Burnett KanarisPerez, Nyaira Hodgens N, RN Coping: Level of anxiety will decrease 12/22/2017 1722 - Progressing by Burnett KanarisPerez, Jaloni Davoli N, RN Elimination: Will not experience complications related to bowel motility 12/22/2017 1722 - Progressing by Burnett KanarisPerez, Angenette Daily N, RN Will not experience complications related to urinary retention 12/22/2017 1722 - Progressing by Burnett KanarisPerez, Brendia Dampier N, RN Pain Managment: General experience of comfort will improve 12/22/2017 1722 - Progressing by Burnett KanarisPerez, Jaelyn Bourgoin N, RN Safety: Ability to remain free from injury will improve 12/22/2017 1722 - Progressing by Burnett KanarisPerez, Jahquez Steffler N, RN Skin Integrity: Risk for impaired skin integrity will decrease 12/22/2017 1722 - Progressing by Burnett KanarisPerez, Deyton Ellenbecker N, RN

## 2017-12-23 MED ORDER — QUETIAPINE FUMARATE 300 MG PO TABS
300.0000 mg | ORAL_TABLET | Freq: Every day | ORAL | Status: DC
  Administered 2017-12-23: 21:00:00 300 mg via ORAL
  Filled 2017-12-23: qty 1

## 2017-12-23 MED ORDER — PRAZOSIN HCL 2 MG PO CAPS
2.0000 mg | ORAL_CAPSULE | Freq: Two times a day (BID) | ORAL | Status: DC
  Administered 2017-12-23 – 2017-12-30 (×14): 2 mg via ORAL
  Filled 2017-12-23 (×17): qty 1

## 2017-12-23 NOTE — Plan of Care (Signed)
  Progressing Education: Knowledge of General Education information will improve 12/23/2017 1446 - Progressing by Burnett KanarisPerez, Aubrie Lucien N, RN Health Behavior/Discharge Planning: Ability to manage health-related needs will improve 12/23/2017 1446 - Progressing by Burnett KanarisPerez, Aujanae Mccullum N, RN Clinical Measurements: Ability to maintain clinical measurements within normal limits will improve 12/23/2017 1446 - Progressing by Burnett KanarisPerez, Anielle Headrick N, RN Will remain free from infection 12/23/2017 1446 - Progressing by Burnett KanarisPerez, Harrol Novello N, RN Diagnostic test results will improve 12/23/2017 1446 - Progressing by Burnett KanarisPerez, Lirio Bach N, RN Respiratory complications will improve 12/23/2017 1446 - Progressing by Burnett KanarisPerez, Aleicia Kenagy N, RN Cardiovascular complication will be avoided 12/23/2017 1446 - Progressing by Burnett KanarisPerez, Shamra Bradeen N, RN Activity: Risk for activity intolerance will decrease 12/23/2017 1446 - Progressing by Burnett KanarisPerez, Lamoyne Palencia N, RN Nutrition: Adequate nutrition will be maintained 12/23/2017 1446 - Progressing by Burnett KanarisPerez, Cindy Brindisi N, RN Coping: Level of anxiety will decrease 12/23/2017 1446 - Progressing by Burnett KanarisPerez, Divine Imber N, RN Elimination: Will not experience complications related to bowel motility 12/23/2017 1446 - Progressing by Burnett KanarisPerez, Kathelyn Gombos N, RN Will not experience complications related to urinary retention 12/23/2017 1446 - Progressing by Burnett KanarisPerez, Jude Linck N, RN Pain Managment: General experience of comfort will improve 12/23/2017 1446 - Progressing by Burnett KanarisPerez, Jena Tegeler N, RN Safety: Ability to remain free from injury will improve 12/23/2017 1446 - Progressing by Burnett KanarisPerez, Kyrra Prada N, RN Skin Integrity: Risk for impaired skin integrity will decrease 12/23/2017 1446 - Progressing by Burnett KanarisPerez, Geoffery Aultman N, RN

## 2017-12-23 NOTE — Progress Notes (Signed)
[facility] - [place] [facility]   PATIENT NAME: [name]    MR#:  [id number]  DATE OF BIRTH:  05/07/1981  SUBJECTIVE:  CHIEF COMPLAINT:   Chief Complaint  Patient presents with  . Shoulder Pain   - h/o drug abuse has MSSA bacteremia and right septic shoulder -Right shoulder pain and swelling are improving. Blood pressure was elevated and patient's urine tox tested positive again for cocaine while in the hospital and she admits to doing it now  REVIEW OF SYSTEMS:  Review of Systems  Constitutional: Negative for chills, fever and malaise/fatigue.  HENT: Negative for ear discharge, hearing loss and nosebleeds.   Eyes: Negative for blurred vision and double vision.  Respiratory: Negative for cough, shortness of breath and wheezing.   Cardiovascular: Negative for chest pain, palpitations and leg swelling.  Gastrointestinal: Negative for abdominal pain, constipation, diarrhea, nausea and vomiting.  Genitourinary: Negative for dysuria.  Musculoskeletal: Positive for joint pain and myalgias.  Neurological: Negative for dizziness, speech change, focal weakness, seizures and headaches.  Psychiatric/Behavioral: Negative for depression.    DRUG ALLERGIES:   Allergies  Allergen Reactions  . Amoxicillin Rash  . Latex   . Latex   . Vancomycin   . Vancomycin   . Amoxicillin Rash  . Clindamycin/Lincomycin Rash  . Clindamycin/Lincomycin Rash    VITALS:  Blood pressure (!) 163/88, pulse 97, temperature 98.5 F (36.9 C), temperature source Oral, resp. rate 18, height 5\' 7"  (1.702 m), weight 57 kg (125 lb 10.6 oz), SpO2 94 %.  PHYSICAL EXAMINATION:  Physical Exam  GENERAL:  37 y.o.-year-old patient lying in the bed with no acute distress.  EYES: Pupils equal, round, reactive to light and accommodation. No scleral icterus. Extraocular muscles intact.  HEENT: Head atraumatic, normocephalic. Oropharynx and nasopharynx clear.  NECK:  Supple, no jugular  venous distention. No thyroid enlargement, no tenderness.  LUNGS: Normal breath sounds bilaterally, no wheezing, rales,rhonchi or crepitation. No use of accessory muscles of respiration.  CARDIOVASCULAR: S1, S2 normal. No murmurs, rubs, or gallops.  ABDOMEN: Soft, nontender, nondistended. Bowel sounds present. No organomegaly or mass.  EXTREMITIES: Swelling anteriorly of the right shoulder with some redness and tenderness to touch. Improving since yesterday. No pedal edema, cyanosis, or clubbing.  NEUROLOGIC: Cranial nerves II through XII are intact. Muscle strength 5/5 in all extremities. Sensation intact. Gait not checked.  PSYCHIATRIC: The patient is alert and oriented x 3.  SKIN: No obvious rash, lesion, or ulcer.    LABORATORY PANEL:   CBC Recent Labs  Lab 12/21/17 0618  WBC 7.8  HGB 9.6*  HCT 29.5*  PLT 379   ------------------------------------------------------------------------------------------------------------------  Chemistries  Recent Labs  Lab 12/22/17 0506  NA 135  K 4.7  CL 100*  CO2 25  GLUCOSE 104*  BUN 24*  CREATININE 1.20*  CALCIUM 9.0  MG 1.9   ------------------------------------------------------------------------------------------------------------------  Cardiac Enzymes No results for input(s): TROPONINI in the last 168 hours. ------------------------------------------------------------------------------------------------------------------  RADIOLOGY:  No results found.  EKG:   Orders placed or performed during the hospital encounter of 09/15/17  . ED EKG  . ED EKG  . EKG 12-Lead  . EKG 12-Lead    ASSESSMENT AND PLAN:   37 year old female with past medical history significant for polysubstance abuse including IV drug abuse presents to hospital secondary to right shoulder pain  1. Sepsis-secondary to MSSA bacteremia and also right shoulder septic joint -appreciate Ortho and ID consults - Patient had aspiration of the right  shoulder. Echocardiogram was negative for any vegetations. -Plan was to see if she needed (intervention and cleaning of the right shoulder, however her pain seems to be improving at this point. - however appears much improving clinically -2 weeks of IV antibiotics with Ancef and then oral antibiotics after that. So she will receive IV antibiotics until 12/30/2017. -IV Toradol to help with joint pain which definitely is helping at this point  2. Polysubstance abuse-, urine tox tested positive again for cocaine while she is in the hospital. Counseled. .  3. HTN- on clonidine patch, norvasc added Also restarted her prazosin  4. Depression, night mares- restarted half dose of seroquel at bedtime  5. DVT prophylaxis-Lovenox   Patient is ambulatory    All the records are reviewed and case discussed with Care Management/Social Workerr. Management plans discussed with the patient, family and they are in agreement.  CODE STATUS: Full code  TOTAL TIME TAKING CARE OF THIS PATIENT: 36 minutes.   POSSIBLE D/C IN 7  DAYS, DEPENDING ON CLINICAL CONDITION.   Braylei Totino M.D on 12/23/2017 at 11:39 AM  Between 7am to 6pm - Pager - [phone]  After 6pm go to [email or web address] - password [email or web address]  [facility]  Office  [phone]  CC: Primary care physician; Patient, No Pcp Per

## 2017-12-24 DIAGNOSIS — E440 Moderate protein-calorie malnutrition: Secondary | ICD-10-CM

## 2017-12-24 LAB — BASIC METABOLIC PANEL
Anion gap: 12 (ref 5–15)
BUN: 35 mg/dL — AB (ref 6–20)
CALCIUM: 9.4 mg/dL (ref 8.9–10.3)
CO2: 25 mmol/L (ref 22–32)
Chloride: 98 mmol/L — ABNORMAL LOW (ref 101–111)
Creatinine, Ser: 1.58 mg/dL — ABNORMAL HIGH (ref 0.44–1.00)
GFR calc Af Amer: 48 mL/min — ABNORMAL LOW (ref >60)
GFR, EST NON AFRICAN AMERICAN: 41 mL/min — AB (ref >60)
Glucose, Bld: 105 mg/dL — ABNORMAL HIGH (ref 65–99)
POTASSIUM: 4.5 mmol/L (ref 3.5–5.1)
SODIUM: 135 mmol/L (ref 135–145)

## 2017-12-24 LAB — URINE DRUG SCREEN, QUALITATIVE (ARMC ONLY)
AMPHETAMINES, UR SCREEN: NOT DETECTED
BARBITURATES, UR SCREEN: NOT DETECTED
BENZODIAZEPINE, UR SCRN: NOT DETECTED
Cannabinoid 50 Ng, Ur ~~LOC~~: NOT DETECTED
Cocaine Metabolite,Ur ~~LOC~~: POSITIVE — AB
MDMA (Ecstasy)Ur Screen: NOT DETECTED
Methadone Scn, Ur: NOT DETECTED
Opiate, Ur Screen: POSITIVE — AB
PHENCYCLIDINE (PCP) UR S: NOT DETECTED
Tricyclic, Ur Screen: POSITIVE — AB

## 2017-12-24 LAB — CBC
HCT: 26.7 % — ABNORMAL LOW (ref 35.0–47.0)
Hemoglobin: 8.8 g/dL — ABNORMAL LOW (ref 12.0–16.0)
MCH: 28.1 pg (ref 26.0–34.0)
MCHC: 32.9 g/dL (ref 32.0–36.0)
MCV: 85.3 fL (ref 80.0–100.0)
PLATELETS: 359 10*3/uL (ref 150–440)
RBC: 3.12 MIL/uL — AB (ref 3.80–5.20)
RDW: 16.2 % — ABNORMAL HIGH (ref 11.5–14.5)
WBC: 5.3 10*3/uL (ref 3.6–11.0)

## 2017-12-24 LAB — MAGNESIUM: MAGNESIUM: 2.2 mg/dL (ref 1.7–2.4)

## 2017-12-24 MED ORDER — SODIUM CHLORIDE 0.9 % IV SOLN
INTRAVENOUS | Status: DC
  Administered 2017-12-24 – 2017-12-25 (×2): via INTRAVENOUS

## 2017-12-24 MED ORDER — SODIUM CHLORIDE 0.9% FLUSH
10.0000 mL | INTRAVENOUS | Status: DC | PRN
  Administered 2017-12-24 – 2017-12-26 (×3): 10 mL
  Filled 2017-12-24 (×3): qty 40

## 2017-12-24 MED ORDER — SODIUM CHLORIDE 0.9% FLUSH
10.0000 mL | Freq: Two times a day (BID) | INTRAVENOUS | Status: DC
  Administered 2017-12-24 – 2017-12-26 (×3): 10 mL

## 2017-12-24 MED ORDER — KETOROLAC TROMETHAMINE 10 MG PO TABS
10.0000 mg | ORAL_TABLET | Freq: Once | ORAL | Status: AC
  Administered 2017-12-24: 10 mg via ORAL
  Filled 2017-12-24: qty 1

## 2017-12-24 MED ORDER — QUETIAPINE FUMARATE 25 MG PO TABS
100.0000 mg | ORAL_TABLET | Freq: Every day | ORAL | Status: DC
  Administered 2017-12-24 – 2017-12-29 (×5): 100 mg via ORAL
  Filled 2017-12-24 (×5): qty 4

## 2017-12-24 NOTE — Progress Notes (Signed)
Initial Nutrition Assessment  DOCUMENTATION CODES:   Non-severe (moderate) malnutrition in context of social or environmental circumstances  INTERVENTION:  Encouraged ongoing adequate intake of calories and protein from meals and snacks. Patient is able to meet her needs at this time without an oral nutrition supplement.  NUTRITION DIAGNOSIS:   Moderate Malnutrition related to social / environmental circumstances(IV drug abuse, skipping meals PTA) as evidenced by mild fat depletion, mild muscle depletion.  GOAL:   Patient will meet greater than or equal to 90% of their needs  MONITOR:   PO intake, Labs, Weight trends, I & O's  REASON FOR ASSESSMENT:   LOS    ASSESSMENT:   37 year old female with PMHx of asthma, IV drug abuse, hepatitis C who presented with right shoulder pain found to have sepsis from MSSA bacteremia and septic joint of right shoulder receiving IV antibiotics.   Met with patient at bedside. She reports she has had a very good appetite this admission and has been eating well. Endorses finishing 100% of meals. She denies any N/V, abdominal pain, constipation/diarrhea, or difficulty chewing/swallowing. She reports that PTA she did not always have a good appetite. She reports frequently skipping meals PTA. She does not want any oral nutrition supplements at this time since she is eating so well. RD will continue to monitor. Patient has snacks in room, as well.  UBW was 145 lbs. She reports she has been losing weigth for a few years now. Per chart patient was 145.3 lbs on 04/06/2017 and has lost 19.6 lbs (13.5% body weight) over 9 months, which is not significant for time frame.  Meal Completion: 95-100% In the past 24 hours patient has had approximately 1856 kcal (100% estimated needs) and 73 grams of protein (97% minimum estimated needs).  Medications reviewed and include: Colace, Seroquel, NS @ 100 mL/hr, Ancef.  Labs reviewed: Chloride 98, BUN 35 (trending up),  Creatinine 1.58 (trending up), eGFR 41 (trending down).  NUTRITION - FOCUSED PHYSICAL EXAM:    Most Recent Value  Orbital Region  Mild depletion  Upper Arm Region  Moderate depletion  Thoracic and Lumbar Region  Mild depletion  Buccal Region  Mild depletion  Temple Region  Moderate depletion  Clavicle Bone Region  Mild depletion  Clavicle and Acromion Bone Region  Mild depletion  Scapular Bone Region  Mild depletion  Dorsal Hand  No depletion  Patellar Region  No depletion  Anterior Thigh Region  No depletion  Posterior Calf Region  No depletion  Edema (RD Assessment)  -- [non-pitting edema to lower extremities]  Hair  Reviewed  Eyes  Reviewed  Mouth  Reviewed  Skin  Reviewed  Nails  Reviewed     Diet Order:  Diet regular Room service appropriate? Yes; Fluid consistency: Thin  EDUCATION NEEDS:   Education needs have been addressed  Skin:  Skin Assessment: Skin Integrity Issues:(cellulitis right shoulder; scattered ecchymosis)  Last BM:  12/23/2017 - medium type 6  Height:   Ht Readings from Last 1 Encounters:  12/15/17 '5\' 7"'$  (1.702 m)    Weight:   Wt Readings from Last 1 Encounters:  12/15/17 125 lb 10.6 oz (57 kg)    Ideal Body Weight:  61.4 kg  BMI:  Body mass index is 19.68 kg/m.  Estimated Nutritional Needs:   Kcal:  1690-1950 (MSJ x 1.3-1.5)  Protein:  75-85 grams (1.3-1.5 grams/kg)  Fluid:  1.7-2 L/day (30-35 mL/kg)  Willey Blade, MS, RD, LDN Office: [phone] Pager: [phone] After Hours/Weekend  Pager: [phone]

## 2017-12-24 NOTE — Progress Notes (Signed)
  Subjective:  Patient reports pain as mild.  She is able to move her shoulder with minimal pain.  Objective:   VITALS:   Vitals:   12/24/17 0606 12/24/17 0640 12/24/17 0934 12/24/17 1400  BP: (!) 193/107 (!) 155/80 136/71 132/67  Pulse: (!) 103 95 95 (!) 112  Resp:    14  Temp:    98.8 F (37.1 C)  TempSrc:    Oral  SpO2: 100%  94% 100%  Weight:      Height:        PHYSICAL EXAM:  Neurovascular intact Intact pulses distally decreased erythema, swelling and warmth  LABS  Results for orders placed or performed during the hospital encounter of 12/15/17 (from the past 24 hour(s))  CBC     Status: Abnormal   Collection Time: 12/24/17 12:42 AM  Result Value Ref Range   WBC 5.3 3.6 - 11.0 K/uL   RBC 3.12 (L) 3.80 - 5.20 MIL/uL   Hemoglobin 8.8 (L) 12.0 - 16.0 g/dL   HCT 81.126.7 (L) 91.435.0 - 78.247.0 %   MCV 85.3 80.0 - 100.0 fL   MCH 28.1 26.0 - 34.0 pg   MCHC 32.9 32.0 - 36.0 g/dL   RDW 95.616.2 (H) 21.311.5 - 08.614.5 %   Platelets 359 150 - 440 K/uL  Basic metabolic panel     Status: Abnormal   Collection Time: 12/24/17 12:42 AM  Result Value Ref Range   Sodium 135 135 - 145 mmol/L   Potassium 4.5 3.5 - 5.1 mmol/L   Chloride 98 (L) 101 - 111 mmol/L   CO2 25 22 - 32 mmol/L   Glucose, Bld 105 (H) 65 - 99 mg/dL   BUN 35 (H) 6 - 20 mg/dL   Creatinine, Ser 5.781.58 (H) 0.44 - 1.00 mg/dL   Calcium 9.4 8.9 - 46.910.3 mg/dL   GFR calc non Af Amer 41 (L) >60 mL/min   GFR calc Af Amer 48 (L) >60 mL/min   Anion gap 12 5 - 15  Magnesium     Status: None   Collection Time: 12/24/17 12:42 AM  Result Value Ref Range   Magnesium 2.2 1.7 - 2.4 mg/dL    No results found.  Assessment/Plan:     Active Problems:   Septic joint (HCC)   Malnutrition of moderate degree   Continue ABX therapy No orthopedic follow-up is necessary Please call if condition worsens  Lyndle HerrlichJames R Jessicalynn Deshong , MD 12/24/2017, 2:33 PM

## 2017-12-24 NOTE — Plan of Care (Signed)
In am pt was drowsy and somnolent. VSS. Pt had Seroquel the night before. Dr Nemiah CommanderKalisetti made aware.  Around noon, pt a&ox4. Pt with no IV access. Awaiting PICC placement.

## 2017-12-24 NOTE — Progress Notes (Signed)
Peripherally Inserted Central Catheter/Midline Placement  The IV Nurse has discussed with the patient and/or persons authorized to consent for the patient, the purpose of this procedure and the potential benefits and risks involved with this procedure.  The benefits include less needle sticks, lab draws from the catheter, and the patient may be discharged home with the catheter. Risks include, but not limited to, infection, bleeding, blood clot (thrombus formation), and puncture of an artery; nerve damage and irregular heartbeat and possibility to perform a PICC exchange if needed/ordered by physician.  Alternatives to this procedure were also discussed.  Bard Power PICC patient education guide, fact sheet on infection prevention and patient information card has been provided to patient /or left at bedside.    PICC/Midline Placement Documentation  PICC Single Lumen 12/24/17 PICC Left Basilic 39 cm 3 cm (Active)  Indication for Insertion or Continuance of Line Prolonged intravenous therapies;Poor Vasculature-patient has had multiple peripheral attempts or PIVs lasting less than 24 hours 12/24/2017  5:54 PM  Exposed Catheter (cm) 3 cm 12/24/2017  5:54 PM  Site Assessment Dry;Clean;Intact 12/24/2017  5:54 PM  Line Status Flushed;Saline locked;Blood return noted 12/24/2017  5:54 PM  Dressing Type Transparent 12/24/2017  5:54 PM  Dressing Status Clean;Dry;Intact;Antimicrobial disc in place 12/24/2017  5:54 PM  Dressing Change Due 12/31/17 12/24/2017  5:54 PM       Milia Warth, Lajean ManesKerry Loraine 12/24/2017, 5:55 PM

## 2017-12-24 NOTE — Progress Notes (Signed)
Pharmacy Electrolyte Monitoring Consult:  Pharmacy consulted to assist in monitoring and replacing electrolytes in this 37 y.o. female admitted on 12/15/2017 with septic arthritis and MSSA bacteremia.   Labs:  Sodium (mmol/L)  Date Value  12/24/2017 135  12/14/2014 134 (L)   Potassium (mmol/L)  Date Value  12/24/2017 4.5  12/14/2014 3.6   Magnesium (mg/dL)  Date Value  82/95/621302/09/2018 2.2   Phosphorus (mg/dL)  Date Value  08/65/784602/07/2018 4.5   Calcium (mg/dL)  Date Value  96/29/528402/09/2018 9.4   Calcium, Total (mg/dL)  Date Value  13/24/401002/11/2014 8.6   Albumin (g/dL)  Date Value  27/25/366402/12/2017 3.7  12/14/2014 3.6    Plan: Electrolytes have been WNL for several days. Pharmacy will sign off.  [name], Pharm.D., BCPS Clinical Pharmacist 12/24/2017 7:21 AM

## 2017-12-24 NOTE — Progress Notes (Signed)
Notified Dr Nemiah CommanderKalisetti that pt c/o last night of burning at the MID line site and there is no blood return. Mid line removed. Pt requested a PICC line. Per MD to order PICC.

## 2017-12-24 NOTE — Progress Notes (Addendum)
[place] INFECTIOUS DISEASE PROGRESS NOTE Date of Admission:  12/15/2017     ID: [name] is a 37 y.o. female with  MSSA bacteremia and Shoulder septic joint Active Problems:   Septic joint ([place])   Malnutrition of moderate degree   Subjective: Less pain in R shoulder, no fevers.   ROS  Eleven systems are reviewed and negative except per hpi  Medications:  Antibiotics Given (last 72 hours)    Date/Time Action Medication Dose Rate   12/21/17 2232 New Bag/Given   ceFAZolin (ANCEF) IVPB 2g/100 mL premix 2 g 200 mL/hr   12/22/17 0511 New Bag/Given   ceFAZolin (ANCEF) IVPB 2g/100 mL premix 2 g 200 mL/hr   12/22/17 1256 New Bag/Given   ceFAZolin (ANCEF) IVPB 2g/100 mL premix 2 g 200 mL/hr   12/22/17 2139 New Bag/Given   ceFAZolin (ANCEF) IVPB 2g/100 mL premix 2 g 200 mL/hr   12/23/17 0549 New Bag/Given   ceFAZolin (ANCEF) IVPB 2g/100 mL premix 2 g 200 mL/hr   12/23/17 1247 New Bag/Given   ceFAZolin (ANCEF) IVPB 2g/100 mL premix 2 g 200 mL/hr   12/23/17 2123 New Bag/Given   ceFAZolin (ANCEF) IVPB 2g/100 mL premix 2 g 200 mL/hr   12/24/17 0602 New Bag/Given   ceFAZolin (ANCEF) IVPB 2g/100 mL premix 2 g 200 mL/hr     . amLODipine  5 mg Oral Daily  . cloNIDine  0.2 mg Transdermal Weekly  . docusate sodium  100 mg Oral Daily  . enoxaparin (LOVENOX) injection  40 mg Subcutaneous Q24H  . ketorolac  15 mg Intravenous Q6H  . nicotine  21 mg Transdermal Daily  . prazosin  2 mg Oral BID  . QUEtiapine  100 mg Oral QHS  . sodium chloride flush  10-40 mL Intracatheter Q12H    Objective: Vital signs in last 24 hours: Temp:  [97.9 F (36.6 C)-98.8 F (37.1 C)] 98.8 F (37.1 C) (02/11 1400) Pulse Rate:  [88-112] 112 (02/11 1400) Resp:  [14-19] 14 (02/11 1400) BP: (132-193)/(67-107) 132/67 (02/11 1400) SpO2:  [94 %-100 %] 100 % (02/11 1400) Constitutional: disheveled, in some pain  HENT: [place]/AT, PERRLA, no scleral icterus periorbital ecchymoses  Mouth/Throat:  Oropharynx is clear and dry.   Cardiovascular: tachy, 2/6 sm  Pulmonary/Chest: Effort normal and breath sounds normal. No respiratory distress.  has no wheezes.  Neck = supple, no nuchal rigidity Abdominal: Soft. Bowel sounds are normal.  exhibits no distension. There is no tenderness.  Lymphadenopathy: no cervical adenopathy. No axillary adenopathy Neurological: alert interactive.  Ext - R shoulder with  Improved ROM, mild erythema, mild ttp Skin: no rash. Psychiatric: alert, flat affect    Lab Results Recent Labs    12/22/17 0506 12/24/17 0042  WBC  --  5.3  HGB  --  8.8*  HCT  --  26.7*  NA 135 135  K 4.7 4.5  CL 100* 98*  CO2 25 25  BUN 24* 35*  CREATININE 1.20* 1.58*    Microbiology: Results for orders placed or performed during the hospital encounter of 12/15/17  Blood culture (single)     Status: Abnormal   Collection Time: 12/15/17  3:47 PM  Result Value Ref Range Status   Specimen Description   Final    BLOOD LEFT FOREARM Performed at [facility], [place] [address], [place], [place] [address]    Special Requests   Final    BOTTLES DRAWN AEROBIC AND ANAEROBIC Blood Culture adequate volume Performed at [facility], [address], [place], [place] [address]    Culture  Setup Time   Final    IN BOTH AEROBIC AND ANAEROBIC BOTTLES GRAM POSITIVE COCCI CRITICAL RESULT CALLED TO, READ BACK BY AND VERIFIED WITH: HANK ZOMPA AT 0915 ON 12/16/17 BY SNJ Performed at [facility], [place] [address], [place], [place] [address]    Culture STAPHYLOCOCCUS AUREUS (A)  Final   Report Status 12/18/2017 FINAL  Final   Organism ID, Bacteria STAPHYLOCOCCUS AUREUS  Final      Susceptibility   Staphylococcus aureus - MIC*    CIPROFLOXACIN <=0.5 SENSITIVE Sensitive     ERYTHROMYCIN <=0.25 SENSITIVE Sensitive     GENTAMICIN <=0.5 SENSITIVE Sensitive     OXACILLIN <=0.25 SENSITIVE Sensitive     TETRACYCLINE <=1 SENSITIVE Sensitive     VANCOMYCIN 1 SENSITIVE  Sensitive     TRIMETH/SULFA <=10 SENSITIVE Sensitive     CLINDAMYCIN <=0.25 SENSITIVE Sensitive     RIFAMPIN <=0.5 SENSITIVE Sensitive     Inducible Clindamycin NEGATIVE Sensitive     * STAPHYLOCOCCUS AUREUS  Blood Culture ID Panel (Reflexed)     Status: Abnormal   Collection Time: 12/15/17  3:47 PM  Result Value Ref Range Status   Enterococcus species NOT DETECTED NOT DETECTED Final   Listeria monocytogenes NOT DETECTED NOT DETECTED Final   Staphylococcus species DETECTED (A) NOT DETECTED Final    Comment: CRITICAL RESULT CALLED TO, READ BACK BY AND VERIFIED WITH: HANK ZOMPA AT 0915 ON 12/16/17 BY SNJ    Staphylococcus aureus DETECTED (A) NOT DETECTED Final    Comment: Methicillin (oxacillin) susceptible Staphylococcus aureus (MSSA). Preferred therapy is anti staphylococcal beta lactam antibiotic (Cefazolin or Nafcillin), unless clinically contraindicated. CRITICAL RESULT CALLED TO, READ BACK BY AND VERIFIED WITH: HANK ZOMPA AT 0915 ON 12/16/17 BY SNJ    Methicillin resistance NOT DETECTED NOT DETECTED Final   Streptococcus species NOT DETECTED NOT DETECTED Final   Streptococcus agalactiae NOT DETECTED NOT DETECTED Final   Streptococcus pneumoniae NOT DETECTED NOT DETECTED Final   Streptococcus pyogenes NOT DETECTED NOT DETECTED Final   Acinetobacter baumannii NOT DETECTED NOT DETECTED Final   Enterobacteriaceae species NOT DETECTED NOT DETECTED Final   Enterobacter cloacae complex NOT DETECTED NOT DETECTED Final   Escherichia coli NOT DETECTED NOT DETECTED Final   Klebsiella oxytoca NOT DETECTED NOT DETECTED Final   Klebsiella pneumoniae NOT DETECTED NOT DETECTED Final   Proteus species NOT DETECTED NOT DETECTED Final   Serratia marcescens NOT DETECTED NOT DETECTED Final   Haemophilus influenzae NOT DETECTED NOT DETECTED Final   Neisseria meningitidis NOT DETECTED NOT DETECTED Final   Pseudomonas aeruginosa NOT DETECTED NOT DETECTED Final   Candida albicans NOT DETECTED NOT DETECTED  Final   Candida glabrata NOT DETECTED NOT DETECTED Final   Candida krusei NOT DETECTED NOT DETECTED Final   Candida parapsilosis NOT DETECTED NOT DETECTED Final   Candida tropicalis NOT DETECTED NOT DETECTED Final    Comment: Performed at [facility], [address] [place], [place] [address]  MRSA PCR Screening     Status: None   Collection Time: 12/15/17  8:27 PM  Result Value Ref Range Status   MRSA by PCR NEGATIVE NEGATIVE Final    Comment:        The GeneXpert MRSA Assay (FDA approved for NASAL specimens only), is one component of a comprehensive MRSA colonization surveillance program. It is not intended to diagnose MRSA infection nor to guide or monitor treatment for  MRSA infections. Performed at [facility], [address] [place], [place] [address]   Culture, blood (Routine X 2) w Reflex to ID Panel     Status: None   Collection Time: 12/16/17 12:11 PM  Result Value Ref Range Status   Specimen Description BLOOD RT HAND  Final   Special Requests   Final    BOTTLES DRAWN AEROBIC AND ANAEROBIC Blood Culture adequate volume   Culture   Final    NO GROWTH 5 DAYS Performed at [facility], [address] [place], [place] [address]    Report Status 12/21/2017 FINAL  Final  Body fluid culture     Status: None   Collection Time: 12/17/17  2:06 PM  Result Value Ref Range Status   Specimen Description   Final    SHOULDER Performed at [facility], [address], [place], [place] [address]    Special Requests   Final    RIGHT SHOULDER FLD Performed at [facility], [address] [place], [place] [address]    Gram Stain   Final    ABUNDANT WBC PRESENT,BOTH PMN AND MONONUCLEAR MODERATE GRAM POSITIVE COCCI Performed at [facility], [place] [address] [place], [place] [address]    Culture ABUNDANT STAPHYLOCOCCUS AUREUS  Final   Report Status 12/20/2017 FINAL  Final   Organism ID, Bacteria STAPHYLOCOCCUS AUREUS   Final      Susceptibility   Staphylococcus aureus - MIC*    CIPROFLOXACIN <=0.5 SENSITIVE Sensitive     ERYTHROMYCIN <=0.25 SENSITIVE Sensitive     GENTAMICIN <=0.5 SENSITIVE Sensitive     OXACILLIN <=0.25 SENSITIVE Sensitive     TETRACYCLINE <=1 SENSITIVE Sensitive     VANCOMYCIN 1 SENSITIVE Sensitive     TRIMETH/SULFA <=10 SENSITIVE Sensitive     CLINDAMYCIN <=0.25 SENSITIVE Sensitive     RIFAMPIN <=0.5 SENSITIVE Sensitive     Inducible Clindamycin NEGATIVE Sensitive     * ABUNDANT STAPHYLOCOCCUS AUREUS    Studies/Results: No results found.  Assessment/Plan: Surina Storts is a 37 y.o. female admitted with R shoulder pain and agitation. She was found to have cellulitis of the shoulder and CT showed likely septic arthritis of AC joint. BCX + MSSA.  On admit had ARF with cr 2.7, CRp 27, ESR 65, wbc 19.  HIV neg, tox Screen + amphetamines, cocaine and opiates.  She has a history of HCV, possible previous endocarditis as well as IV drug use. She has had multiple admissions over the last year.  Echo neg for vegetations.  FU BCX 2/3 is Neg S/p aspiration R shoukler 2/4 with purulent material obtained- cx with abundant staph.  Renal function improving.   2/11- no fevers, R shoulder more mobile, less pain.   Recommendations Cont ancef at dosing for MSSA bacteremia She will need a min 2 week course of IV ancef and given her hx she is not a candidate for home IV therapy.  Stop date 12/30/17 Will then need another 4 weeks of oral keflex 500 mg TID.  Shoulder seems improved so likely can avoid washout.   Thank you very much for the consult. Will follow with you.  Leonel Ramsay   12/24/2017, 3:15 PM

## 2017-12-24 NOTE — Progress Notes (Addendum)
The IV team notified me that Infection disease needs to approve PICC line placement. Notified Dr Sampson GoonFitzgerald of the above. Per MD OK for PICC line placement.

## 2017-12-24 NOTE — Progress Notes (Signed)
[facility] - [place] at [facility]   PATIENT NAME: [name]    MR#:  [id number]  DATE OF BIRTH:  11/28/1980  SUBJECTIVE:  CHIEF COMPLAINT:   Chief Complaint  Patient presents with  . Shoulder Pain   - h/o drug abuse has MSSA bacteremia and right septic shoulder - Very sleepy this morning. Started on Seroquel last night  REVIEW OF SYSTEMS:  Review of Systems  Constitutional: Negative for chills, fever and malaise/fatigue.  HENT: Negative for ear discharge, hearing loss and nosebleeds.   Eyes: Negative for blurred vision and double vision.  Respiratory: Negative for cough, shortness of breath and wheezing.   Cardiovascular: Negative for chest pain, palpitations and leg swelling.  Gastrointestinal: Negative for abdominal pain, constipation, diarrhea, nausea and vomiting.  Genitourinary: Negative for dysuria.  Musculoskeletal: Positive for joint pain and myalgias.  Neurological: Negative for dizziness, speech change, focal weakness, seizures and headaches.  Psychiatric/Behavioral: Negative for depression.    DRUG ALLERGIES:   Allergies  Allergen Reactions  . Amoxicillin Rash  . Latex   . Latex   . Vancomycin   . Vancomycin   . Amoxicillin Rash  . Clindamycin/Lincomycin Rash  . Clindamycin/Lincomycin Rash    VITALS:  Blood pressure 136/71, pulse 95, temperature 97.9 F (36.6 C), temperature source Oral, resp. rate 18, height 5\' 7"  (1.702 m), weight 57 kg (125 lb 10.6 oz), SpO2 94 %.  PHYSICAL EXAMINATION:  Physical Exam  GENERAL:  37 y.o.-year-old patient lying in the bed with no acute distress.  EYES: Pupils equal, round, reactive to light and accommodation. No scleral icterus. Extraocular muscles intact.  HEENT: Head atraumatic, normocephalic. Oropharynx and nasopharynx clear.  NECK:  Supple, no jugular venous distention. No thyroid enlargement, no tenderness.  LUNGS: Normal breath sounds bilaterally, no wheezing, rales,rhonchi or  crepitation. No use of accessory muscles of respiration.  CARDIOVASCULAR: S1, S2 normal. No murmurs, rubs, or gallops.  ABDOMEN: Soft, nontender, nondistended. Bowel sounds present. No organomegaly or mass.  EXTREMITIES: Swelling anteriorly of the right shoulder with some redness and tenderness to touch. Improving since yesterday. No pedal edema, cyanosis, or clubbing.  NEUROLOGIC: very sleepy as morning. Arousable to name. Not following commands, some myoclonic jerks noted. But able to move all four extremities  PSYCHIATRIC: The patient is drowsy SKIN: No obvious rash, lesion, or ulcer.    LABORATORY PANEL:   CBC Recent Labs  Lab 12/24/17 0042  WBC 5.3  HGB 8.8*  HCT 26.7*  PLT 359   ------------------------------------------------------------------------------------------------------------------  Chemistries  Recent Labs  Lab 12/24/17 0042  NA 135  K 4.5  CL 98*  CO2 25  GLUCOSE 105*  BUN 35*  CREATININE 1.58*  CALCIUM 9.4  MG 2.2   ------------------------------------------------------------------------------------------------------------------  Cardiac Enzymes No results for input(s): TROPONINI in the last 168 hours. ------------------------------------------------------------------------------------------------------------------  RADIOLOGY:  No results found.  EKG:   Orders placed or performed during the hospital encounter of 09/15/17  . ED EKG  . ED EKG  . EKG 12-Lead  . EKG 12-Lead    ASSESSMENT AND PLAN:   [age] year old female with past medical history significant for polysubstance abuse including IV drug abuse presents to hospital secondary to right shoulder pain  1. Sepsis-secondary to MSSA bacteremia and also right shoulder septic joint -appreciate Ortho and ID consults - Patient had aspiration of the right shoulder. Echocardiogram was negative for any vegetations. -Plan was to see if she needed (intervention and cleaning of the right shoulder,  however her pain  and erythema seems to be improving at this point. - however appears much improving clinically -2 weeks of IV antibiotics with Ancef and then oral antibiotics after that. So she will receive IV antibiotics until 12/30/2017. -IV Toradol to help with joint pain which definitely is helping at this point  2. Polysubstance abuse-, urine tox tested positive again for cocaine while she is in the hospital. Counseled. .  3. HTN- on clonidine patch, norvasc added Also restarted her prazosin  4. Acute encephalopathy- likely from seroquel- IV fluids Decrease the dose tonight Patient needs seroquel for her nightmares  5. DVT prophylaxis-Lovenox   Patient is ambulatory at baseline    All the records are reviewed and case discussed with Care Management/Social Workerr. Management plans discussed with the patient, family and they are in agreement.  CODE STATUS: Full code  TOTAL TIME TAKING CARE OF THIS PATIENT: 35 minutes.   POSSIBLE D/C IN 6  DAYS, DEPENDING ON CLINICAL CONDITION.   Enid Baas M.D on 12/24/2017 at 11:38 AM  Between 7am to 6pm - Pager - [phone]  After 6pm go to [email or web address] - password [email or web address]  [facility] [place] [facility]  Office  [phone]  CC: Primary care physician; Patient, No Pcp Per

## 2017-12-25 LAB — BASIC METABOLIC PANEL
ANION GAP: 7 (ref 5–15)
BUN: 29 mg/dL — AB (ref 6–20)
CALCIUM: 8.9 mg/dL (ref 8.9–10.3)
CO2: 28 mmol/L (ref 22–32)
CREATININE: 1.15 mg/dL — AB (ref 0.44–1.00)
Chloride: 100 mmol/L — ABNORMAL LOW (ref 101–111)
GFR calc Af Amer: >60 mL/min (ref >60)
GLUCOSE: 125 mg/dL — AB (ref 65–99)
Potassium: 4.4 mmol/L (ref 3.5–5.1)
Sodium: 135 mmol/L (ref 135–145)

## 2017-12-25 MED ORDER — KETOROLAC TROMETHAMINE 10 MG PO TABS
10.0000 mg | ORAL_TABLET | Freq: Three times a day (TID) | ORAL | Status: AC
  Administered 2017-12-25 – 2017-12-30 (×15): 10 mg via ORAL
  Filled 2017-12-25 (×15): qty 1

## 2017-12-25 NOTE — Progress Notes (Signed)
[facility] - [place] at [facility]   PATIENT NAME: [name]    MR#:  [id number]  DATE OF BIRTH:  05-05-1981  SUBJECTIVE:  CHIEF COMPLAINT:   Chief Complaint  Patient presents with  . Shoulder Pain   - h/o drug abuse has MSSA bacteremia and right septic shoulder - Doing well, denies any pain. Right shoulder infection is improving  REVIEW OF SYSTEMS:  Review of Systems  Constitutional: Negative for chills, fever and malaise/fatigue.  HENT: Negative for ear discharge, hearing loss and nosebleeds.   Eyes: Negative for blurred vision and double vision.  Respiratory: Negative for cough, shortness of breath and wheezing.   Cardiovascular: Negative for chest pain, palpitations and leg swelling.  Gastrointestinal: Negative for abdominal pain, constipation, diarrhea, nausea and vomiting.  Genitourinary: Negative for dysuria.  Musculoskeletal: Positive for joint pain and myalgias.  Neurological: Negative for dizziness, speech change, focal weakness, seizures and headaches.  Psychiatric/Behavioral: Negative for depression.    DRUG ALLERGIES:   Allergies  Allergen Reactions  . Amoxicillin Rash  . Latex   . Latex   . Vancomycin   . Vancomycin   . Amoxicillin Rash  . Clindamycin/Lincomycin Rash  . Clindamycin/Lincomycin Rash    VITALS:  Blood pressure (!) 121/59, pulse 90, temperature 99.3 F (37.4 C), temperature source Oral, resp. rate 18, height 5\' 7"  (1.702 m), weight 57 kg (125 lb 10.6 oz), SpO2 97 %.  PHYSICAL EXAMINATION:  Physical Exam  GENERAL:  37 y.o.-year-old patient lying in the bed with no acute distress.  EYES: Pupils equal, round, reactive to light and accommodation. No scleral icterus. Extraocular muscles intact.  HEENT: Head atraumatic, normocephalic. Oropharynx and nasopharynx clear.  NECK:  Supple, no jugular venous distention. No thyroid enlargement, no tenderness.  LUNGS: Normal breath sounds bilaterally, no wheezing,  rales,rhonchi or crepitation. No use of accessory muscles of respiration.  CARDIOVASCULAR: S1, S2 normal. No murmurs, rubs, or gallops.  ABDOMEN: Soft, nontender, nondistended. Bowel sounds present. No organomegaly or mass.  EXTREMITIES: Much improved swelling of the right shoulder with minimal redness and tenderness to touch.   No pedal edema, cyanosis, or clubbing.  NEUROLOGIC: Alert and awake, cranial nerves intact. No motor F6. Sensation is intact.  PSYCHIATRIC: The patient is alert and oriented 3 SKIN: No obvious rash, lesion, or ulcer.    LABORATORY PANEL:   CBC Recent Labs  Lab 12/24/17 0042  WBC 5.3  HGB 8.8*  HCT 26.7*  PLT 359   ------------------------------------------------------------------------------------------------------------------  Chemistries  Recent Labs  Lab 12/24/17 0042 12/25/17 0315  NA 135 135  K 4.5 4.4  CL 98* 100*  CO2 25 28  GLUCOSE 105* 125*  BUN 35* 29*  CREATININE 1.58* 1.15*  CALCIUM 9.4 8.9  MG 2.2  --    ------------------------------------------------------------------------------------------------------------------  Cardiac Enzymes No results for input(s): TROPONINI in the last 168 hours. ------------------------------------------------------------------------------------------------------------------  RADIOLOGY:  No results found.  EKG:   Orders placed or performed during the hospital encounter of 09/15/17  . ED EKG  . ED EKG  . EKG 12-Lead  . EKG 12-Lead    ASSESSMENT AND PLAN:   37 year old female with past medical history significant for polysubstance abuse including IV drug abuse presents to hospital secondary to right shoulder pain  1. Sepsis-secondary to MSSA bacteremia and also right shoulder septic joint -appreciate Ortho and ID consults - Patient had aspiration of the right shoulder on admission. Echocardiogram was negative for any vegetations. -Plan was to see if she needed (  intervention and cleaning of  the right shoulder, however her pain and erythema seems to be improving at this point. - appears much improving clinically-so no indication for any shoulder washout at this time. -2 weeks of IV antibiotics with Ancef and then oral antibiotics after that. So she will receive IV antibiotics until 12/30/2017. After that, discharged on 4 weeks of oral Keflex 500 mg 3 times a day -IV Toradol to help with joint pain which definitely is helping at this point  2. Polysubstance abuse-, urine tox tested positive again for cocaine while she is in the hospital. Counseled. .  3. HTN- on clonidine patch, norvasc added Also restarted her prazosin  4. Acute encephalopathy- likely from high-dose Seroquel, so changed 200 mg only at bedtime. It has helped with her sleep and also mentation  5. DVT prophylaxis-Lovenox   Patient is ambulatory at baseline    All the records are reviewed and case discussed with Care Management/Social Workerr. Management plans discussed with the patient, family and they are in agreement.  CODE STATUS: Full code  TOTAL TIME TAKING CARE OF THIS PATIENT: 36 minutes.   POSSIBLE D/C IN 5  DAYS, DEPENDING ON CLINICAL CONDITION.   Enid BaasKALISETTI,Gracelee Stemmler M.D on 12/25/2017 at 8:43 AM  Between 7am to 6pm - Pager - [phone]  After 6pm go to [email or web address] - password [email or web address]  [facility] [place] [facility]  Office  [phone]  CC: Primary care physician; Patient, No Pcp Per

## 2017-12-26 MED ORDER — BUPRENORPHINE HCL-NALOXONE HCL 2-0.5 MG SL SUBL
2.0000 | SUBLINGUAL_TABLET | Freq: Once | SUBLINGUAL | Status: DC
  Filled 2017-12-26: qty 2

## 2017-12-26 MED ORDER — LORAZEPAM 2 MG/ML IJ SOLN
2.0000 mg | Freq: Once | INTRAMUSCULAR | Status: AC
  Administered 2017-12-26: 14:00:00 2 mg via INTRAVENOUS
  Filled 2017-12-26: qty 1

## 2017-12-26 MED ORDER — BUPRENORPHINE HCL-NALOXONE HCL 8-2 MG SL SUBL
1.0000 | SUBLINGUAL_TABLET | Freq: Once | SUBLINGUAL | Status: DC
  Filled 2017-12-26: qty 1

## 2017-12-26 MED ORDER — NALOXONE HCL 0.4 MG/ML IJ SOLN
0.4000 mg | INTRAMUSCULAR | Status: DC | PRN
  Administered 2017-12-26: 0.4 mg via INTRAVENOUS
  Filled 2017-12-26: qty 1

## 2017-12-26 NOTE — Progress Notes (Signed)
Pt is drowsy,somnolent respond to voice.  VSS. Suboxone due. Dr Juliene PinaMody notified of the above. Suboxone held per order.

## 2017-12-26 NOTE — Progress Notes (Signed)
Patient was noted to be drowsy, drooling during consultant visit. Woke up with narcan. Denies using any drugs. Appears to be going through withdrawals. Likely opioid use. Diaphoretic later. Discussed with on call psychiatrist- recommended subutex 8 mg today one dose and 4mg  tomorrow one time dose to help with opioid withdrawals.

## 2017-12-26 NOTE — Progress Notes (Signed)
During med pass noted [name] very diaphoretic. VSS. [name] currently in the bathroom. Dr Nemiah CommanderKalisetti notified of the above. No new orders at this time.

## 2017-12-26 NOTE — Progress Notes (Signed)
[place] INFECTIOUS DISE[facility] PROGRESS NOTE Date of Admission:  12/15/2017     ID: [name] is a 37 y.o. female with  MSSA bacteremia and Shoulder septic joint Active Problems:   Septic joint ([place])   Malnutrition of moderate degree   Subjective: Completely altered, drooling and with rhinohea  ROS  Unable to obtain   Medications:  Antibiotics Given (last 72 hours)    Date/Time Action Medication Dose Rate   12/23/17 2123 New Bag/Given   ceFAZolin (ANCEF) IVPB 2g/100 mL premix 2 g 200 mL/hr   12/24/17 0602 New Bag/Given   ceFAZolin (ANCEF) IVPB 2g/100 mL premix 2 g 200 mL/hr   12/24/17 1830 New Bag/Given   ceFAZolin (ANCEF) IVPB 2g/100 mL premix 2 g 200 mL/hr   12/25/17 0327 New Bag/Given   ceFAZolin (ANCEF) IVPB 2g/100 mL premix 2 g 200 mL/hr   12/25/17 0946 New Bag/Given   ceFAZolin (ANCEF) IVPB 2g/100 mL premix 2 g 200 mL/hr   12/25/17 1705 New Bag/Given   ceFAZolin (ANCEF) IVPB 2g/100 mL premix 2 g 200 mL/hr   12/26/17 0244 New Bag/Given   ceFAZolin (ANCEF) IVPB 2g/100 mL premix 2 g 200 mL/hr   12/26/17 1044 New Bag/Given   ceFAZolin (ANCEF) IVPB 2g/100 mL premix 2 g 200 mL/hr     . amLODipine  5 mg Oral Daily  . cloNIDine  0.2 mg Transdermal Weekly  . docusate sodium  100 mg Oral Daily  . enoxaparin (LOVENOX) injection  40 mg Subcutaneous Q24H  . ketorolac  10 mg Oral TID  . nicotine  21 mg Transdermal Daily  . prazosin  2 mg Oral BID  . QUEtiapine  100 mg Oral QHS  . sodium chloride flush  10-40 mL Intracatheter Q12H  . sodium chloride flush  10-40 mL Intracatheter Q12H    Objective: Vital signs in last 24 hours: Temp:  [98.3 F (36.8 C)-98.7 F (37.1 C)] 98.5 F (36.9 C) (02/13 1054) Pulse Rate:  [97-124] 98 (02/13 1054) Resp:  [18-22] 18 (02/13 1054) BP: (104-167)/(71-97) 132/81 (02/13 1054) SpO2:  [98 %-100 %] 99 % (02/13 1054) Constitutional: altered, agitated,  HENT: [place]/AT, pupils pinpoint , no scleral icterus periorbital ecchymoses   Mouth/Throat: Oropharynx is clear and dry  Cardiovascular: regular , 2/6 sm  Pulmonary/Chest: Effort normal and breath sounds normal. No respiratory distress.  has no wheezes.  Neck = supple, no nuchal rigidity Abdominal: Soft. Bowel sounds are normal.  exhibits no distension. There is no tenderness.  Lymphadenopathy: no cervical adenopathy. No axillary adenopathy Neurological: alternating agitation and sedation  Ext - R shoulder with  Improved ROM, mild erythema, mild ttp Skin: no rash. Psychiatric: altered    Lab Results Recent Labs    12/24/17 0042 12/25/17 0315  WBC 5.3  --   HGB 8.8*  --   HCT 26.7*  --   NA 135 135  K 4.5 4.4  CL 98* 100*  CO2 25 28  BUN 35* 29*  CREATININE 1.58* 1.15*    Microbiology: Results for orders placed or performed during the hospital encounter of 12/15/17  Blood culture (single)     Status: Abnormal   Collection Time: 12/15/17  3:47 PM  Result Value Ref Range Status   Specimen Description   Final    BLOOD LEFT FOREARM Performed at [facility], [place] [address] [place], [place] [address]    Special Requests   Final    BOTTLES DRAWN AEROBIC AND ANAEROBIC Blood Culture adequate volume  Performed at [facility], [address] [place], [place] [address]    Culture  Setup Time   Final    IN BOTH AEROBIC AND ANAEROBIC BOTTLES GRAM POSITIVE COCCI CRITICAL RESULT CALLED TO, READ BACK BY AND VERIFIED WITH: HANK ZOMPA AT 0915 ON 12/16/17 BY SNJ Performed at [facility], [place] [address] [place], [place] [address]    Culture STAPHYLOCOCCUS AUREUS (A)  Final   Report Status 12/18/2017 FINAL  Final   Organism ID, Bacteria STAPHYLOCOCCUS AUREUS  Final      Susceptibility   Staphylococcus aureus - MIC*    CIPROFLOXACIN <=0.5 SENSITIVE Sensitive     ERYTHROMYCIN <=0.25 SENSITIVE Sensitive     GENTAMICIN <=0.5 SENSITIVE Sensitive     OXACILLIN <=0.25 SENSITIVE Sensitive     TETRACYCLINE <=1 SENSITIVE Sensitive      VANCOMYCIN 1 SENSITIVE Sensitive     TRIMETH/SULFA <=10 SENSITIVE Sensitive     CLINDAMYCIN <=0.25 SENSITIVE Sensitive     RIFAMPIN <=0.5 SENSITIVE Sensitive     Inducible Clindamycin NEGATIVE Sensitive     * STAPHYLOCOCCUS AUREUS  Blood Culture ID Panel (Reflexed)     Status: Abnormal   Collection Time: 12/15/17  3:47 PM  Result Value Ref Range Status   Enterococcus species NOT DETECTED NOT DETECTED Final   Listeria monocytogenes NOT DETECTED NOT DETECTED Final   Staphylococcus species DETECTED (A) NOT DETECTED Final    Comment: CRITICAL RESULT CALLED TO, READ BACK BY AND VERIFIED WITH: HANK ZOMPA AT 0915 ON 12/16/17 BY SNJ    Staphylococcus aureus DETECTED (A) NOT DETECTED Final    Comment: Methicillin (oxacillin) susceptible Staphylococcus aureus (MSSA). Preferred therapy is anti staphylococcal beta lactam antibiotic (Cefazolin or Nafcillin), unless clinically contraindicated. CRITICAL RESULT CALLED TO, READ BACK BY AND VERIFIED WITH: HANK ZOMPA AT 0915 ON 12/16/17 BY SNJ    Methicillin resistance NOT DETECTED NOT DETECTED Final   Streptococcus species NOT DETECTED NOT DETECTED Final   Streptococcus agalactiae NOT DETECTED NOT DETECTED Final   Streptococcus pneumoniae NOT DETECTED NOT DETECTED Final   Streptococcus pyogenes NOT DETECTED NOT DETECTED Final   Acinetobacter baumannii NOT DETECTED NOT DETECTED Final   Enterobacteriaceae species NOT DETECTED NOT DETECTED Final   Enterobacter cloacae complex NOT DETECTED NOT DETECTED Final   Escherichia coli NOT DETECTED NOT DETECTED Final   Klebsiella oxytoca NOT DETECTED NOT DETECTED Final   Klebsiella pneumoniae NOT DETECTED NOT DETECTED Final   Proteus species NOT DETECTED NOT DETECTED Final   Serratia marcescens NOT DETECTED NOT DETECTED Final   Haemophilus influenzae NOT DETECTED NOT DETECTED Final   Neisseria meningitidis NOT DETECTED NOT DETECTED Final   Pseudomonas aeruginosa NOT DETECTED NOT DETECTED Final   Candida albicans  NOT DETECTED NOT DETECTED Final   Candida glabrata NOT DETECTED NOT DETECTED Final   Candida krusei NOT DETECTED NOT DETECTED Final   Candida parapsilosis NOT DETECTED NOT DETECTED Final   Candida tropicalis NOT DETECTED NOT DETECTED Final    Comment: Performed at [facility], [address] [place], [place] [address]  MRSA PCR Screening     Status: None   Collection Time: 12/15/17  8:27 PM  Result Value Ref Range Status   MRSA by PCR NEGATIVE NEGATIVE Final    Comment:        The GeneXpert MRSA Assay (FDA approved for NASAL specimens only), is one component of a comprehensive MRSA colonization surveillance program. It is not intended to diagnose MRSA infection nor to guide or  monitor treatment for MRSA infections. Performed at [facility], [address] [place], [place] [address]   Culture, blood (Routine X 2) w Reflex to ID Panel     Status: None   Collection Time: 12/16/17 12:11 PM  Result Value Ref Range Status   Specimen Description BLOOD RT HAND  Final   Special Requests   Final    BOTTLES DRAWN AEROBIC AND ANAEROBIC Blood Culture adequate volume   Culture   Final    NO GROWTH 5 DAYS Performed at [facility], [address] [place], [place] [address]    Report Status 12/21/2017 FINAL  Final  Body fluid culture     Status: None   Collection Time: 12/17/17  2:06 PM  Result Value Ref Range Status   Specimen Description   Final    SHOULDER Performed at [facility], [address], [place], [place] [address]    Special Requests   Final    RIGHT SHOULDER FLD Performed at [facility], [address], [place], [place] [address]    Gram Stain   Final    ABUNDANT WBC PRESENT,BOTH PMN AND MONONUCLEAR MODERATE GRAM POSITIVE COCCI Performed at [facility], [place] [address] [place], [place] [address]    Culture ABUNDANT STAPHYLOCOCCUS AUREUS  Final   Report Status 12/20/2017 FINAL  Final   Organism ID,  Bacteria STAPHYLOCOCCUS AUREUS  Final      Susceptibility   Staphylococcus aureus - MIC*    CIPROFLOXACIN <=0.5 SENSITIVE Sensitive     ERYTHROMYCIN <=0.25 SENSITIVE Sensitive     GENTAMICIN <=0.5 SENSITIVE Sensitive     OXACILLIN <=0.25 SENSITIVE Sensitive     TETRACYCLINE <=1 SENSITIVE Sensitive     VANCOMYCIN 1 SENSITIVE Sensitive     TRIMETH/SULFA <=10 SENSITIVE Sensitive     CLINDAMYCIN <=0.25 SENSITIVE Sensitive     RIFAMPIN <=0.5 SENSITIVE Sensitive     Inducible Clindamycin NEGATIVE Sensitive     * ABUNDANT STAPHYLOCOCCUS AUREUS    Studies/Results: No results found.  Assessment/Plan: Wiktoria Hemrick is a 37 y.o. female admitted with R shoulder pain and agitation. She was found to have cellulitis of the shoulder and CT showed likely septic arthritis of AC joint. BCX + MSSA.  On admit had ARF with cr 2.7, CRp 27, ESR 65, wbc 19.  HIV neg, tox Screen + amphetamines, cocaine and opiates.  She has a history of HCV, possible previous endocarditis as well as IV drug use. She has had multiple admissions over the last year.  Echo neg for vegetations.  FU BCX 2/3 is Neg S/p aspiration R shoukler 2/4 with purulent material obtained- cx with abundant staph.  Renal function improving.   2/11- no fevers, R shoulder more mobile, less pain.  2/13 when I came into room she was hunched over tray table, clear rhinorrhea. Obviously high and altered. Boyfriend in room. Called security.   Recommendations   Spoke with nurse manager- police alerted. Was given narcan - concerned she has ODed.  She became alert and interactive. Denied any IV drug use.   Cont ancef at dosing for MSSA bacteremia She will need a min 2 week course of IV ancef and given her hx she is not a candidate for home IV therapy.  Stop date 12/30/17 Will then need another 4 weeks of oral keflex 500 mg TID.  Shoulder seems improved so likely can avoid washout.   Thank you very much for the consult. Will follow with  you.  Leonel Ramsay   12/26/2017, 2:17 PM

## 2017-12-26 NOTE — Progress Notes (Signed)
In patient room after giving patient her the scheduled po toradol.  Patient asking questions about what happened earlier in day and about whether her boyfriend was arrested.  Patient stated that she was "glad he was gone and that she hoped we got the drugs he had hidden in the bedside table."  Upon checking bedside table, this nurse found a large metal spoon with a large amount of tan power sitting in it, what appeared to be some type of scale, a cut straw, rolled up dollar bill, small empty zip lock baggies with white powder residue and more syringes. Nurse administrator Dewayne HatchAnn called and in room to remove items.  Items then given to security guard Moses. Orson Apeanielle Hassan [place], RN

## 2017-12-26 NOTE — Progress Notes (Addendum)
[employer] [facility] -  at [facility]   PATIENT NAME: [name]    MR#:  [id number]  DATE OF BIRTH:  07-Oct-1981  SUBJECTIVE:  CHIEF COMPLAINT:   Chief Complaint  Patient presents with  . Shoulder Pain   - h/o drug abuse has MSSA bacteremia and right septic shoulder - agitated today, upset that her pain medicine was not given in time, also very anxious  REVIEW OF SYSTEMS:  Review of Systems  Constitutional: Negative for chills, fever and malaise/fatigue.  HENT: Negative for ear discharge, hearing loss and nosebleeds.   Eyes: Negative for blurred vision and double vision.  Respiratory: Negative for cough, shortness of breath and wheezing.   Cardiovascular: Negative for chest pain, palpitations and leg swelling.  Gastrointestinal: Negative for abdominal pain, constipation, diarrhea, nausea and vomiting.  Genitourinary: Negative for dysuria.  Musculoskeletal: Positive for joint pain and myalgias.  Neurological: Negative for dizziness, speech change, focal weakness, seizures and headaches.  Psychiatric/Behavioral: Negative for depression.    DRUG ALLERGIES:   Allergies  Allergen Reactions  . Amoxicillin Rash  . Latex   . Latex   . Vancomycin   . Vancomycin   . Amoxicillin Rash  . Clindamycin/Lincomycin Rash  . Clindamycin/Lincomycin Rash    VITALS:  Blood pressure 132/81, pulse 98, temperature 98.5 F (36.9 C), temperature source Oral, resp. rate 18, height 5\' 7"  (1.702 m), weight 57 kg (125 lb 10.6 oz), SpO2 99 %.  PHYSICAL EXAMINATION:  Physical Exam  GENERAL:  37 y.o.-year-old patient lying in the bed with no acute distress.  EYES: Pupils equal, round, reactive to light and accommodation. No scleral icterus. Extraocular muscles intact.  HEENT: Head atraumatic, normocephalic. Oropharynx and nasopharynx clear.  NECK:  Supple, no jugular venous distention. No thyroid enlargement, no tenderness.  LUNGS: Normal breath sounds bilaterally, no  wheezing, rales,rhonchi or crepitation. No use of accessory muscles of respiration.  CARDIOVASCULAR: S1, S2 normal. No murmurs, rubs, or gallops.  ABDOMEN: Soft, nontender, nondistended. Bowel sounds present. No organomegaly or mass.  EXTREMITIES: Swelling anteriorly of the right shoulder with minimal redness and tenderness to touch. Improving greatly. No pedal edema, cyanosis, or clubbing.  NEUROLOGIC: cranial nerves intact, motor strength is 5/5 all four extremities and sensation is intact  gait normal.   PSYCHIATRIC: The patient is alert, oriented x 3, very anxious today SKIN: No obvious rash, lesion, or ulcer.    LABORATORY PANEL:   CBC Recent Labs  Lab 12/24/17 0042  WBC 5.3  HGB 8.8*  HCT 26.7*  PLT 359   ------------------------------------------------------------------------------------------------------------------  Chemistries  Recent Labs  Lab 12/24/17 0042 12/25/17 0315  NA 135 135  K 4.5 4.4  CL 98* 100*  CO2 25 28  GLUCOSE 105* 125*  BUN 35* 29*  CREATININE 1.58* 1.15*  CALCIUM 9.4 8.9  MG 2.2  --    ------------------------------------------------------------------------------------------------------------------  Cardiac Enzymes No results for input(s): TROPONINI in the last 168 hours. ------------------------------------------------------------------------------------------------------------------  RADIOLOGY:  No results found.  EKG:   Orders placed or performed during the hospital encounter of 09/15/17  . ED EKG  . ED EKG  . EKG 12-Lead  . EKG 12-Lead    ASSESSMENT AND PLAN:   37 year old female with past medical history significant for polysubstance abuse including IV drug abuse presents to hospital secondary to right shoulder pain  1. Sepsis-secondary to MSSA bacteremia and also right shoulder septic joint -appreciate Ortho and ID consults - Patient had aspiration of the right shoulder. Echocardiogram was negative  for any  vegetations. -Sensitive right shoulder pain, swelling have improved, improved range of motion-she will not need any surgery to washout at this point. Appreciate orthopedic input. -2 weeks of IV antibiotics with Ancef and then oral antibiotics after that. So she will receive IV antibiotics until 12/30/2017. Last dose scheduled at 6:30PM on 12/30/17 -after that discharge on 4 weeks of oral Keflex 500 mg 3 times a day -on Toradol to help with joint pain which definitely is helping at this point  2. Polysubstance abuse-, urine tox tested positive again for cocaine while she is in the hospital. Counseled. .  3. HTN- on clonidine patch, norvasc added Also restarted her prazosin  4. Acute encephalopathy- resolved -Secondary to higher doses of seroquel. Decreased now. -  very anxious today, will give 1 extra dose of Ativan.  5. DVT prophylaxis-Lovenox   Patient is ambulatory at baseline Updated sister over the phone today    All the records are reviewed and case discussed with Care Management/Social Workerr. Management plans discussed with the patient, family and they are in agreement.  CODE STATUS: Full code  TOTAL TIME TAKING CARE OF THIS PATIENT: 35 minutes.   POSSIBLE D/C IN 5 DAYS, DEPENDING ON CLINICAL CONDITION.   Enid BaasKALISETTI,Karisha Marlin M.D on 12/26/2017 at 1:26 PM  Between 7am to 6pm - Pager - [phone]  After 6pm go to [email or web address] - password [email or web address]  [facility] [place] [facility]  Office  [phone]  CC: Primary care physician; Patient, No Pcp Per

## 2017-12-26 NOTE — Progress Notes (Addendum)
Called to patient's room by Dr. Sampson GoonFitzgerald, patient was altered and drooling, unable to be aroused.  Patient's nurse in another room.  Dr. Dorena BodoKalisetti, Ann, nursing administer, and security notified of possible drug overdose.  This charge nurse administered 0.4mg  narcan IV.  Patient responded to the narcan and denied using any drugs.  When moving patient's bed, needle seen laying in boyfriend's shoes. Security made aware.  [place] [employer] called for patient's boyfriend.  Numerous needles found in patient's draws and cabinets in room.  Patient resting in bed at this time.  Tele-sitter placed in patient's room to monitor for safety and no more visitor per Dr. Nemiah CommanderKalisetti.  This nurse called patient's sister Herbert SetaHeather.  Orson Apeanielle Nessie Nong, RN

## 2017-12-27 NOTE — Plan of Care (Signed)
  Progressing Education: Knowledge of General Education information will improve 12/27/2017 1058 - Progressing by Kathreen CosierMalcolm, Keiden Deskin A, RN 12/27/2017 1057 - Progressing by Kathreen CosierMalcolm, Jakira Mcfadden A, RN Health Behavior/Discharge Planning: Ability to manage health-related needs will improve 12/27/2017 1058 - Progressing by Kathreen CosierMalcolm, Jemery Stacey A, RN 12/27/2017 1057 - Progressing by Kathreen CosierMalcolm, Baillie Mohammad A, RN Clinical Measurements: Ability to maintain clinical measurements within normal limits will improve 12/27/2017 1058 - Progressing by Kathreen CosierMalcolm, Quantina Dershem A, RN 12/27/2017 1057 - Progressing by Kathreen CosierMalcolm, Odette Watanabe A, RN Will remain free from infection 12/27/2017 1058 - Progressing by Kathreen CosierMalcolm, Mescal Flinchbaugh A, RN 12/27/2017 1057 - Progressing by Kathreen CosierMalcolm, Bronislaw Switzer A, RN Diagnostic test results will improve 12/27/2017 1058 - Progressing by Kathreen CosierMalcolm, Madora Barletta A, RN 12/27/2017 1057 - Progressing by Kathreen CosierMalcolm, Jaimya Feliciano A, RN Respiratory complications will improve 12/27/2017 1058 - Progressing by Kathreen CosierMalcolm, Yarlin Breisch A, RN 12/27/2017 1057 - Progressing by Kathreen CosierMalcolm, Bliss Tsang A, RN Cardiovascular complication will be avoided 12/27/2017 1058 - Progressing by Kathreen CosierMalcolm, Akansha Wyche A, RN 12/27/2017 1057 - Progressing by Kathreen CosierMalcolm, Joscelyne Renville A, RN Activity: Risk for activity intolerance will decrease 12/27/2017 1058 - Progressing by Kathreen CosierMalcolm, Meria Crilly A, RN 12/27/2017 1057 - Progressing by Kathreen CosierMalcolm, Eric Nees A, RN Nutrition: Adequate nutrition will be maintained 12/27/2017 1058 - Progressing by Kathreen CosierMalcolm, Audrie Kuri A, RN 12/27/2017 1057 - Progressing by Kathreen CosierMalcolm, Enijah Furr A, RN Coping: Level of anxiety will decrease 12/27/2017 1058 - Progressing by Kathreen CosierMalcolm, Zniya Cottone A, RN 12/27/2017 1057 - Progressing by Kathreen CosierMalcolm, Cimberly Stoffel A, RN Elimination: Will not experience complications related to bowel motility 12/27/2017 1058 - Progressing by Kathreen CosierMalcolm, Anel Creighton A, RN 12/27/2017 1057 - Progressing by Kathreen CosierMalcolm, Infant Doane A, RN Will not experience complications related to urinary retention 12/27/2017 1058 - Progressing by Kathreen CosierMalcolm,  Anae Hams A, RN 12/27/2017 1057 - Progressing by Kathreen CosierMalcolm, Aryanna Shaver A, RN Pain Managment: General experience of comfort will improve 12/27/2017 1058 - Progressing by Kathreen CosierMalcolm, Krisalyn Yankowski A, RN 12/27/2017 1057 - Progressing by Kathreen CosierMalcolm, Lasonya Hubner A, RN Safety: Ability to remain free from injury will improve 12/27/2017 1058 - Progressing by Kathreen CosierMalcolm, Celeste Candelas A, RN 12/27/2017 1057 - Progressing by Kathreen CosierMalcolm, Sharona Rovner A, RN Skin Integrity: Risk for impaired skin integrity will decrease 12/27/2017 1058 - Progressing by Kathreen CosierMalcolm, Coburn Knaus A, RN 12/27/2017 1057 - Progressing by Kathreen CosierMalcolm, Betzalel Umbarger A, RN

## 2017-12-27 NOTE — Progress Notes (Signed)
[employer] [facility] - [place] at [facility]   PATIENT NAME: [name]    MR#:  [id number]  DATE OF BIRTH:  19-Feb-1981  SUBJECTIVE:  CHIEF COMPLAINT:   Chief Complaint  Patient presents with  . Shoulder Pain   - h/o drug abuse has MSSA bacteremia and right septic shoulder - concern for overdose yesterday, no visitors allowed and has a telesitter  REVIEW OF SYSTEMS:  Review of Systems  Constitutional: Negative for chills, fever and malaise/fatigue.  HENT: Negative for ear discharge, hearing loss and nosebleeds.   Eyes: Negative for blurred vision and double vision.  Respiratory: Negative for cough, shortness of breath and wheezing.   Cardiovascular: Negative for chest pain, palpitations and leg swelling.  Gastrointestinal: Negative for abdominal pain, constipation, diarrhea, nausea and vomiting.  Genitourinary: Negative for dysuria.  Musculoskeletal: Positive for joint pain and myalgias.  Neurological: Negative for dizziness, speech change, focal weakness, seizures and headaches.  Psychiatric/Behavioral: Negative for depression.    DRUG ALLERGIES:   Allergies  Allergen Reactions  . Amoxicillin Rash  . Latex   . Latex   . Vancomycin   . Vancomycin   . Amoxicillin Rash  . Clindamycin/Lincomycin Rash  . Clindamycin/Lincomycin Rash    VITALS:  Blood pressure 134/81, pulse 90, temperature 98.8 F (37.1 C), temperature source Oral, resp. rate 18, height 5\' 7"  (1.702 m), weight 57 kg (125 lb 10.6 oz), SpO2 93 %.  PHYSICAL EXAMINATION:  Physical Exam  GENERAL:  37 y.o.-year-old patient lying in the bed with no acute distress.  EYES: Pupils equal, round, reactive to light and accommodation. No scleral icterus. Extraocular muscles intact.  HEENT: Head atraumatic, normocephalic. Oropharynx and nasopharynx clear.  NECK:  Supple, no jugular venous distention. No thyroid enlargement, no tenderness.  LUNGS: Normal breath sounds bilaterally, no wheezing,  rales,rhonchi or crepitation. No use of accessory muscles of respiration.  CARDIOVASCULAR: S1, S2 normal. No murmurs, rubs, or gallops.  ABDOMEN: Soft, nontender, nondistended. Bowel sounds present. No organomegaly or mass.  EXTREMITIES: Swelling anteriorly of the right shoulder with minimal redness and tenderness to touch. Improving greatly. No pedal edema, cyanosis, or clubbing.  NEUROLOGIC: cranial nerves intact, motor strength is 5/5 all four extremities and sensation is intact  gait normal.   PSYCHIATRIC: The patient is alert, oriented x 3,   SKIN: No obvious rash, lesion, or ulcer.    LABORATORY PANEL:   CBC Recent Labs  Lab 12/24/17 0042  WBC 5.3  HGB 8.8*  HCT 26.7*  PLT 359   ------------------------------------------------------------------------------------------------------------------  Chemistries  Recent Labs  Lab 12/24/17 0042 12/25/17 0315  NA 135 135  K 4.5 4.4  CL 98* 100*  CO2 25 28  GLUCOSE 105* 125*  BUN 35* 29*  CREATININE 1.58* 1.15*  CALCIUM 9.4 8.9  MG 2.2  --    ------------------------------------------------------------------------------------------------------------------  Cardiac Enzymes No results for input(s): TROPONINI in the last 168 hours. ------------------------------------------------------------------------------------------------------------------  RADIOLOGY:  No results found.  EKG:   Orders placed or performed during the hospital encounter of 09/15/17  . ED EKG  . ED EKG  . EKG 12-Lead  . EKG 12-Lead    ASSESSMENT AND PLAN:   37 year old female with past medical history significant for polysubstance abuse including IV drug abuse presents to hospital secondary to right shoulder pain  1. Sepsis-secondary to MSSA bacteremia and also right shoulder septic joint -appreciate Ortho and ID consults - Patient had aspiration of the right shoulder. Echocardiogram was negative for any vegetations. -Sensitive right  shoulder  pain, swelling have improved, improved range of motion-she will not need any surgery to washout at this point. Appreciate orthopedic input. -2 weeks of IV antibiotics with Ancef and then oral antibiotics after that. So she will receive IV antibiotics until 12/30/2017. Last dose scheduled at 6:30PM on 12/30/17 -after that discharge on 4 weeks of oral Keflex 500 mg 3 times a day -on Toradol to help with joint pain which definitely is helping at this point  2. Polysubstance abuse-, urine tox tested positive again for cocaine while she is in the hospital.  - concern for overdose yesterday- u tox positive for cocaine and opioids- suboxone ordered to help with withdrawals - No visitors allowed now, tele sitter in the room  3. HTN- on clonidine patch, norvasc added Also restarted her prazosin  4. DVT prophylaxis-Lovenox   Patient is ambulatory at baseline    All the records are reviewed and case discussed with Care Management/Social Workerr. Management plans discussed with the patient, family and they are in agreement.  CODE STATUS: Full code  TOTAL TIME TAKING CARE OF THIS PATIENT: 35 minutes.   POSSIBLE D/C IN 4 DAYS, DEPENDING ON CLINICAL CONDITION.   Enid BaasKALISETTI,Manessa Buley M.D on 12/27/2017 at 7:36 AM  Between 7am to 6pm - Pager - [phone]  After 6pm go to [email or web address] - password [facility]  [facility] [place] [facility]  Office  [phone]  CC: Primary care physician; Patient, No Pcp Per

## 2017-12-27 NOTE — Plan of Care (Signed)
Care reviewed with pt, Prn norco given x1, slept all night. Pt had uneventful night.

## 2017-12-28 NOTE — Progress Notes (Signed)
ANTIBIOTIC CONSULT NOTE -follow up  Pharmacy Consult for cefazolin Indication: MSSA bacteremia  Allergies  Allergen Reactions  . Amoxicillin Rash  . Latex   . Latex   . Vancomycin   . Vancomycin   . Amoxicillin Rash  . Clindamycin/Lincomycin Rash  . Clindamycin/Lincomycin Rash    Patient Measurements: Height: 5\' 7"  (170.2 cm) Weight: 125 lb 10.6 oz (57 kg) IBW/kg (Calculated) : 61.6 Adjusted Body Weight:   Vital Signs: Temp: 98.8 F (37.1 C) (02/15 0452) Temp Source: Oral (02/15 0452) BP: 136/79 (02/15 0452) Pulse Rate: 88 (02/15 0452) Intake/Output from previous day: 02/14 0701 - 02/15 0700 In: 300 [IV Piggyback:300] Out: -  Intake/Output from this shift: No intake/output data recorded.  Labs: No results for input(s): WBC, HGB, PLT, LABCREA, CREATININE in the last 72 hours. Estimated Creatinine Clearance: 61.4 mL/min (A) (by C-G formula based on SCr of 1.15 mg/dL (H)). No results for input(s): VANCOTROUGH, VANCOPEAK, VANCORANDOM, GENTTROUGH, GENTPEAK, GENTRANDOM, TOBRATROUGH, TOBRAPEAK, TOBRARND, AMIKACINPEAK, AMIKACINTROU, AMIKACIN in the last 72 hours.   Microbiology: Recent Results (from the past 720 hour(s))  Blood culture (single)     Status: Abnormal   Collection Time: 12/15/17  3:47 PM  Result Value Ref Range Status   Specimen Description   Final    BLOOD LEFT FOREARM Performed at [facility], [address], [place], [place] [address]    Special Requests   Final    BOTTLES DRAWN AEROBIC AND ANAEROBIC Blood Culture adequate volume Performed at [facility], [address] [place], [place] [address]    Culture  Setup Time   Final    IN BOTH AEROBIC AND ANAEROBIC BOTTLES GRAM POSITIVE COCCI CRITICAL RESULT CALLED TO, READ BACK BY AND VERIFIED WITH: HANK ZOMPA AT 0915 ON 12/16/17 BY SNJ Performed at [facility], [address], [place], [place] [address]    Culture STAPHYLOCOCCUS AUREUS (A)  Final   Report Status 12/18/2017  FINAL  Final   Organism ID, Bacteria STAPHYLOCOCCUS AUREUS  Final      Susceptibility   Staphylococcus aureus - MIC*    CIPROFLOXACIN <=0.5 SENSITIVE Sensitive     ERYTHROMYCIN <=0.25 SENSITIVE Sensitive     GENTAMICIN <=0.5 SENSITIVE Sensitive     OXACILLIN <=0.25 SENSITIVE Sensitive     TETRACYCLINE <=1 SENSITIVE Sensitive     VANCOMYCIN 1 SENSITIVE Sensitive     TRIMETH/SULFA <=10 SENSITIVE Sensitive     CLINDAMYCIN <=0.25 SENSITIVE Sensitive     RIFAMPIN <=0.5 SENSITIVE Sensitive     Inducible Clindamycin NEGATIVE Sensitive     * STAPHYLOCOCCUS AUREUS  Blood Culture ID Panel (Reflexed)     Status: Abnormal   Collection Time: 12/15/17  3:47 PM  Result Value Ref Range Status   Enterococcus species NOT DETECTED NOT DETECTED Final   Listeria monocytogenes NOT DETECTED NOT DETECTED Final   Staphylococcus species DETECTED (A) NOT DETECTED Final    Comment: CRITICAL RESULT CALLED TO, READ BACK BY AND VERIFIED WITH: HANK ZOMPA AT 0915 ON 12/16/17 BY SNJ    Staphylococcus aureus DETECTED (A) NOT DETECTED Final    Comment: Methicillin (oxacillin) susceptible Staphylococcus aureus (MSSA). Preferred therapy is anti staphylococcal beta lactam antibiotic (Cefazolin or Nafcillin), unless clinically contraindicated. CRITICAL RESULT CALLED TO, READ BACK BY AND VERIFIED WITH: HANK ZOMPA AT 0915 ON 12/16/17 BY SNJ    Methicillin resistance NOT DETECTED NOT DETECTED Final   Streptococcus species NOT DETECTED NOT DETECTED Final   Streptococcus agalactiae NOT DETECTED NOT DETECTED  Final   Streptococcus pneumoniae NOT DETECTED NOT DETECTED Final   Streptococcus pyogenes NOT DETECTED NOT DETECTED Final   Acinetobacter baumannii NOT DETECTED NOT DETECTED Final   Enterobacteriaceae species NOT DETECTED NOT DETECTED Final   Enterobacter cloacae complex NOT DETECTED NOT DETECTED Final   Escherichia coli NOT DETECTED NOT DETECTED Final   Klebsiella oxytoca NOT DETECTED NOT DETECTED Final   Klebsiella  pneumoniae NOT DETECTED NOT DETECTED Final   Proteus species NOT DETECTED NOT DETECTED Final   Serratia marcescens NOT DETECTED NOT DETECTED Final   Haemophilus influenzae NOT DETECTED NOT DETECTED Final   Neisseria meningitidis NOT DETECTED NOT DETECTED Final   Pseudomonas aeruginosa NOT DETECTED NOT DETECTED Final   Candida albicans NOT DETECTED NOT DETECTED Final   Candida glabrata NOT DETECTED NOT DETECTED Final   Candida krusei NOT DETECTED NOT DETECTED Final   Candida parapsilosis NOT DETECTED NOT DETECTED Final   Candida tropicalis NOT DETECTED NOT DETECTED Final    Comment: Performed at [facility], [address] [place], [place] [address]  MRSA PCR Screening     Status: None   Collection Time: 12/15/17  8:27 PM  Result Value Ref Range Status   MRSA by PCR NEGATIVE NEGATIVE Final    Comment:        The GeneXpert MRSA Assay (FDA approved for NASAL specimens only), is one component of a comprehensive MRSA colonization surveillance program. It is not intended to diagnose MRSA infection nor to guide or monitor treatment for MRSA infections. Performed at [facility], [address] [place], [place] [address]   Culture, blood (Routine X 2) w Reflex to ID Panel     Status: None   Collection Time: 12/16/17 12:11 PM  Result Value Ref Range Status   Specimen Description BLOOD RT HAND  Final   Special Requests   Final    BOTTLES DRAWN AEROBIC AND ANAEROBIC Blood Culture adequate volume   Culture   Final    NO GROWTH 5 DAYS Performed at [facility], [address] [place], [place] [address]    Report Status 12/21/2017 FINAL  Final  Body fluid culture     Status: None   Collection Time: 12/17/17  2:06 PM  Result Value Ref Range Status   Specimen Description   Final    SHOULDER Performed at [facility], [address] [place], [place] [address]    Special Requests   Final    RIGHT SHOULDER FLD Performed at [facility], [address] [place], [place] [address]    Gram Stain   Final    ABUNDANT WBC PRESENT,BOTH PMN AND MONONUCLEAR MODERATE GRAM POSITIVE COCCI Performed at [facility], [address] [place], [place] [address]    Culture ABUNDANT STAPHYLOCOCCUS AUREUS  Final   Report Status 12/20/2017 FINAL  Final   Organism ID, Bacteria STAPHYLOCOCCUS AUREUS  Final      Susceptibility   Staphylococcus aureus - MIC*    CIPROFLOXACIN <=0.5 SENSITIVE Sensitive     ERYTHROMYCIN <=0.25 SENSITIVE Sensitive     GENTAMICIN <=0.5 SENSITIVE Sensitive     OXACILLIN <=0.25 SENSITIVE Sensitive     TETRACYCLINE <=1 SENSITIVE Sensitive     VANCOMYCIN 1 SENSITIVE Sensitive     TRIMETH/SULFA <=10 SENSITIVE Sensitive     CLINDAMYCIN <=0.25 SENSITIVE Sensitive     RIFAMPIN <=0.5 SENSITIVE Sensitive     Inducible Clindamycin NEGATIVE Sensitive     * ABUNDANT STAPHYLOCOCCUS AUREUS  Medical History: Past Medical History:  Diagnosis Date  . Acute deep vein thrombosis of arm (HCC) 08/19/2014   Last Assessment & Plan:  - diagnosed 08/2014 in hospital - s/p heparin gtt - patient never took her xarelto x 3 month course that was planned - no symptoms today.  No further treatment - we discussed avoiding IVDU (see below) to reduce her chances of provoked upper extremity DVT   . Asthma   . HCV (hepatitis C virus) 07/17/2013   Last Assessment & Plan:  - positive antibody most recently 08/2014 - hep C RNA 08/2014 was negative - prior to that, HCV RNA [id number] in 06/2013. No record for genotype or imaging.   - patient counseled that she could become re-infected with continued IVDU or unprotected sexual encounters.   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   Last Assessment & Plan:  Discussed routine use of Valtrex prophylaxis at 36 weeks. She will be delivered by cesarean at 36-37 weeks.   . Infection with methicillin-resistant Staphylococcus aureus 03/19/2013    Overview: Arm abscess with MRSA, treated.  Subsequent screening negative  . IV drug abuse (HCC)   . Tenosynovitis 08/19/2014    Medications:  Infusions:  .  ceFAZolin (ANCEF) IV 2 g (12/28/17 1003)   Assessment: 36 yof with MSSA bacteremia. Per ID: min 2 week course of IV ancef and given her hx she is not a candidate for home IV therapy d/t drug use. Stop date 12/30/17 She has a history of HCV, possible previous endocarditis as well as IV drug use.She has had multiple admissions over the last year  Goal of Therapy:  Resolve infection Prevent ADE  Plan:  Continue cefazolin 2g q 8 hr. Stop date 2/17 end of day (last dose at 1800)   Melissa D Roselind Messier, Pharm.D., BCPS Clinical Pharmacist 12/28/2017,10:14 AM

## 2017-12-28 NOTE — Plan of Care (Signed)
  Progressing Education: Knowledge of General Education information will improve 12/28/2017 1644 - Progressing by Kathreen CosierMalcolm, Beadie Matsunaga A, RN Health Behavior/Discharge Planning: Ability to manage health-related needs will improve 12/28/2017 1644 - Progressing by Kathreen CosierMalcolm, Monti Villers A, RN Clinical Measurements: Ability to maintain clinical measurements within normal limits will improve 12/28/2017 1644 - Progressing by Kathreen CosierMalcolm, Maylea Soria A, RN Will remain free from infection 12/28/2017 1644 - Progressing by Kathreen CosierMalcolm, Barbara Keng A, RN Diagnostic test results will improve 12/28/2017 1644 - Progressing by Kathreen CosierMalcolm, Faysal Fenoglio A, RN Respiratory complications will improve 12/28/2017 1644 - Progressing by Kathreen CosierMalcolm, [place] Dodgen A, RN Cardiovascular complication will be avoided 12/28/2017 1644 - Progressing by Kathreen CosierMalcolm, [place] Mccuen A, RN Activity: Risk for activity intolerance will decrease 12/28/2017 1644 - Progressing by Kathreen CosierMalcolm, Robertta Halfhill A, RN Nutrition: Adequate nutrition will be maintained 12/28/2017 1644 - Progressing by Kathreen CosierMalcolm, Janisha Bueso A, RN Coping: Level of anxiety will decrease 12/28/2017 1644 - Progressing by Kathreen CosierMalcolm, Kuba Shepherd A, RN Elimination: Will not experience complications related to bowel motility 12/28/2017 1644 - Progressing by Kathreen CosierMalcolm, Kelliann Pendergraph A, RN Will not experience complications related to urinary retention 12/28/2017 1644 - Progressing by Kathreen CosierMalcolm, Holle Sprick A, RN Pain Managment: General experience of comfort will improve 12/28/2017 1644 - Progressing by Kathreen CosierMalcolm, Jerelyn Trimarco A, RN Safety: Ability to remain free from injury will improve 12/28/2017 1644 - Progressing by Kathreen CosierMalcolm, Annalese Stiner A, RN Skin Integrity: Risk for impaired skin integrity will decrease 12/28/2017 1644 - Progressing by Kathreen CosierMalcolm, Hind Chesler A, RN

## 2017-12-28 NOTE — Progress Notes (Signed)
[facility]   PATIENT NAME: [name]    MR#:  [id number]  DATE OF BIRTH:  03/22/1981  SUBJECTIVE:  CHIEF COMPLAINT:   Chief Complaint  Patient presents with  . Shoulder Pain   - h/o drug abuse has MSSA bacteremia and right septic shoulder - concern for drug use while in the hospital, no visitors allowed and has a telesitter -No complaints this morning  REVIEW OF SYSTEMS:  Review of Systems  Constitutional: Negative for chills, fever and malaise/fatigue.  HENT: Negative for ear discharge, hearing loss and nosebleeds.   Eyes: Negative for blurred vision and double vision.  Respiratory: Negative for cough, shortness of breath and wheezing.   Cardiovascular: Negative for chest pain, palpitations and leg swelling.  Gastrointestinal: Negative for abdominal pain, constipation, diarrhea, nausea and vomiting.  Genitourinary: Negative for dysuria.  Musculoskeletal: Positive for joint pain and myalgias.  Neurological: Negative for dizziness, speech change, focal weakness, seizures and headaches.  Psychiatric/Behavioral: Negative for depression.    DRUG ALLERGIES:   Allergies  Allergen Reactions  . Amoxicillin Rash  . Latex   . Latex   . Vancomycin   . Vancomycin   . Amoxicillin Rash  . Clindamycin/Lincomycin Rash  . Clindamycin/Lincomycin Rash    VITALS:  Blood pressure 136/79, pulse 88, temperature 98.8 F (37.1 C), temperature source Oral, resp. rate (!) 22, height 5\' 7"  (1.702 m), weight 57 kg (125 lb 10.6 oz), SpO2 99 %.  PHYSICAL EXAMINATION:  Physical Exam  GENERAL:  37 y.o.-year-old patient lying in the bed with no acute distress.  EYES: Pupils equal, round, reactive to light and accommodation. No scleral icterus. Extraocular muscles intact.  HEENT: Head atraumatic, normocephalic. Oropharynx and nasopharynx clear.  NECK:  Supple, no jugular venous distention. No thyroid enlargement, no tenderness.  LUNGS: Normal  breath sounds bilaterally, no wheezing, rales,rhonchi or crepitation. No use of accessory muscles of respiration.  CARDIOVASCULAR: S1, S2 normal. No murmurs, rubs, or gallops.  ABDOMEN: Soft, nontender, nondistended. Bowel sounds present. No organomegaly or mass.  EXTREMITIES: . Improving greatly. Normal-appearing right shoulder with only minimal tenderness on movement now. No swelling noted. No pedal edema, cyanosis, or clubbing.  NEUROLOGIC: cranial nerves intact, motor strength is 5/5 all four extremities and sensation is intact  gait normal.   PSYCHIATRIC: The patient is alert, oriented x 3,   SKIN: No obvious rash, lesion, or ulcer.    LABORATORY PANEL:   CBC Recent Labs  Lab 12/24/17 0042  WBC 5.3  HGB 8.8*  HCT 26.7*  PLT 359   ------------------------------------------------------------------------------------------------------------------  Chemistries  Recent Labs  Lab 12/24/17 0042 12/25/17 0315  NA 135 135  K 4.5 4.4  CL 98* 100*  CO2 25 28  GLUCOSE 105* 125*  BUN 35* 29*  CREATININE 1.58* 1.15*  CALCIUM 9.4 8.9  MG 2.2  --    ------------------------------------------------------------------------------------------------------------------  Cardiac Enzymes No results for input(s): TROPONINI in the last 168 hours. ------------------------------------------------------------------------------------------------------------------  RADIOLOGY:  No results found.  EKG:   Orders placed or performed during the hospital encounter of 09/15/17  . ED EKG  . ED EKG  . EKG 12-Lead  . EKG 12-Lead    ASSESSMENT AND PLAN:   37 year old female with past medical history significant for polysubstance abuse including IV drug abuse presents to hospital secondary to right shoulder pain  1. Sepsis-secondary to MSSA bacteremia and also right shoulder septic joint -appreciate Ortho and ID consults - Patient had aspiration of the  right shoulder. Echocardiogram was  negative for any vegetations. -Sensitive right shoulder pain, swelling have improved, improved range of motion-she will not need any surgery to washout at this point. Appreciate orthopedic input. -2 weeks of IV antibiotics with Ancef and then oral antibiotics after that. So she will receive IV antibiotics until 12/30/2017. Last dose scheduled at 6:30PM on 12/30/17 -after that discharge on 4 weeks of oral Keflex 500 mg 3 times a day -on Toradol to help with joint pain which definitely is helping at this point  2. Polysubstance abuse-, urine tox tested positive again for cocaine while she is in the hospital.  - concern for overdose- u tox positive for cocaine and opioids- was monitored for withdrawals. - No visitors allowed now, tele sitter in the room  3. HTN- on clonidine patch, norvasc Also restarted her prazosin  4. DVT prophylaxis-Lovenox   Patient is ambulatory at baseline    All the records are reviewed and case discussed with Care Management/Social Workerr. Management plans discussed with the patient, family and they are in agreement.  CODE STATUS: Full code  TOTAL TIME TAKING CARE OF THIS PATIENT: 28 minutes.   POSSIBLE D/C IN 2 DAYS, DEPENDING ON CLINICAL CONDITION.   Enid Baas M.D on 12/28/2017 at 12:44 PM  Between 7am to 6pm - Pager - [phone]  After 6pm go to [email or web address] - password [employer]  [facility]  Office  [phone]  CC: Primary care physician; Patient, No Pcp Per

## 2017-12-28 NOTE — Progress Notes (Signed)
[facility] [place] INFECTIOUS DISEASE PROGRESS NOTE Date of Admission:  12/15/2017     ID: [name] is a 37 y.o. female with  MSSA bacteremia and Shoulder septic joint Active Problems:   Septic joint ([place])   Malnutrition of moderate degree   Subjective: No fevers, on monitor now  ROS  Unable to obtain   Medications:  Antibiotics Given (last 72 hours)    Date/Time Action Medication Dose Rate   12/25/17 1705 New Bag/Given   ceFAZolin (ANCEF) IVPB 2g/100 mL premix 2 g 200 mL/hr   12/26/17 0244 New Bag/Given   ceFAZolin (ANCEF) IVPB 2g/100 mL premix 2 g 200 mL/hr   12/26/17 1044 New Bag/Given   ceFAZolin (ANCEF) IVPB 2g/100 mL premix 2 g 200 mL/hr   12/26/17 1851 New Bag/Given   ceFAZolin (ANCEF) IVPB 2g/100 mL premix 2 g 200 mL/hr   12/27/17 0121 New Bag/Given   ceFAZolin (ANCEF) IVPB 2g/100 mL premix 2 g 200 mL/hr   12/27/17 1010 New Bag/Given   ceFAZolin (ANCEF) IVPB 2g/100 mL premix 2 g 200 mL/hr   12/27/17 1808 New Bag/Given   ceFAZolin (ANCEF) IVPB 2g/100 mL premix 2 g 200 mL/hr   12/28/17 0255 New Bag/Given   ceFAZolin (ANCEF) IVPB 2g/100 mL premix 2 g 200 mL/hr   12/28/17 1003 New Bag/Given   ceFAZolin (ANCEF) IVPB 2g/100 mL premix 2 g 200 mL/hr     . amLODipine  5 mg Oral Daily  . buprenorphine-naloxone  2 tablet Sublingual Once  . buprenorphine-naloxone  1 tablet Sublingual Once  . cloNIDine  0.2 mg Transdermal Weekly  . docusate sodium  100 mg Oral Daily  . enoxaparin (LOVENOX) injection  40 mg Subcutaneous Q24H  . ketorolac  10 mg Oral TID  . nicotine  21 mg Transdermal Daily  . prazosin  2 mg Oral BID  . QUEtiapine  100 mg Oral QHS  . sodium chloride flush  10-40 mL Intracatheter Q12H    Objective: Vital signs in last 24 hours: Temp:  [98.8 F (37.1 C)-98.9 F (37.2 C)] 98.8 F (37.1 C) (02/15 0452) Pulse Rate:  [86-88] 88 (02/15 0452) Resp:  [16-22] 22 (02/15 0452) BP: (125-136)/(75-79) 136/79 (02/15 0452) SpO2:  [99 %-100 %] 99 % (02/15  0452) Constitutional:disheveled, in some pain HENT: [place]/AT, PERRLA, no scleral icterus periorbital ecchymoses  Mouth/Throat: Oropharynx is clear and dry.  Cardiovascular: tachy, 2/6 sm  Pulmonary/Chest: Effort normal and breath sounds normal. No respiratory distress. has no wheezes.  Neck = supple, no nuchal rigidity Abdominal: Soft. Bowel sounds are normal. exhibits no distension. There is no tenderness.  Lymphadenopathy: no cervical adenopathy. No axillary adenopathy Neurological: alert interactive. Ext -R shoulder with  Improved ROM, mild erythema, mild ttp Skin:no rash. Psychiatric:alert, flat affect    Lab Results No results for input(s): WBC, HGB, HCT, NA, K, CL, CO2, BUN, CREATININE, GLU in the last 72 hours.  Invalid input(s): PLATELETS  Microbiology: Results for orders placed or performed during the hospital encounter of 12/15/17  Blood culture (single)     Status: Abnormal   Collection Time: 12/15/17  3:47 PM  Result Value Ref Range Status   Specimen Description   Final    BLOOD LEFT FOREARM Performed at [facility], [address], [place], [place] [address]    Special Requests   Final    BOTTLES DRAWN AEROBIC AND ANAEROBIC Blood Culture adequate volume Performed at [facility], [address], [place], [place] [address]    Culture  Setup Time   Final    IN BOTH AEROBIC AND ANAEROBIC BOTTLES GRAM POSITIVE COCCI CRITICAL RESULT CALLED TO, READ BACK BY AND VERIFIED WITH: HANK ZOMPA AT 0915 ON 12/16/17 BY SNJ Performed at [facility], [place] [address] [place], [place] [address]    Culture STAPHYLOCOCCUS AUREUS (A)  Final   Report Status 12/18/2017 FINAL  Final   Organism ID, Bacteria STAPHYLOCOCCUS AUREUS  Final      Susceptibility   Staphylococcus aureus - MIC*    CIPROFLOXACIN <=0.5 SENSITIVE Sensitive     ERYTHROMYCIN <=0.25 SENSITIVE Sensitive     GENTAMICIN <=0.5 SENSITIVE Sensitive     OXACILLIN <=0.25 SENSITIVE Sensitive      TETRACYCLINE <=1 SENSITIVE Sensitive     VANCOMYCIN 1 SENSITIVE Sensitive     TRIMETH/SULFA <=10 SENSITIVE Sensitive     CLINDAMYCIN <=0.25 SENSITIVE Sensitive     RIFAMPIN <=0.5 SENSITIVE Sensitive     Inducible Clindamycin NEGATIVE Sensitive     * STAPHYLOCOCCUS AUREUS  Blood Culture ID Panel (Reflexed)     Status: Abnormal   Collection Time: 12/15/17  3:47 PM  Result Value Ref Range Status   Enterococcus species NOT DETECTED NOT DETECTED Final   Listeria monocytogenes NOT DETECTED NOT DETECTED Final   Staphylococcus species DETECTED (A) NOT DETECTED Final    Comment: CRITICAL RESULT CALLED TO, READ BACK BY AND VERIFIED WITH: HANK ZOMPA AT 0915 ON 12/16/17 BY SNJ    Staphylococcus aureus DETECTED (A) NOT DETECTED Final    Comment: Methicillin (oxacillin) susceptible Staphylococcus aureus (MSSA). Preferred therapy is anti staphylococcal beta lactam antibiotic (Cefazolin or Nafcillin), unless clinically contraindicated. CRITICAL RESULT CALLED TO, READ BACK BY AND VERIFIED WITH: HANK ZOMPA AT 0915 ON 12/16/17 BY SNJ    Methicillin resistance NOT DETECTED NOT DETECTED Final   Streptococcus species NOT DETECTED NOT DETECTED Final   Streptococcus agalactiae NOT DETECTED NOT DETECTED Final   Streptococcus pneumoniae NOT DETECTED NOT DETECTED Final   Streptococcus pyogenes NOT DETECTED NOT DETECTED Final   Acinetobacter baumannii NOT DETECTED NOT DETECTED Final   Enterobacteriaceae species NOT DETECTED NOT DETECTED Final   Enterobacter cloacae complex NOT DETECTED NOT DETECTED Final   Escherichia coli NOT DETECTED NOT DETECTED Final   Klebsiella oxytoca NOT DETECTED NOT DETECTED Final   Klebsiella pneumoniae NOT DETECTED NOT DETECTED Final   Proteus species NOT DETECTED NOT DETECTED Final   Serratia marcescens NOT DETECTED NOT DETECTED Final   Haemophilus influenzae NOT DETECTED NOT DETECTED Final   Neisseria meningitidis NOT DETECTED NOT DETECTED Final   Pseudomonas aeruginosa NOT  DETECTED NOT DETECTED Final   Candida albicans NOT DETECTED NOT DETECTED Final   Candida glabrata NOT DETECTED NOT DETECTED Final   Candida krusei NOT DETECTED NOT DETECTED Final   Candida parapsilosis NOT DETECTED NOT DETECTED Final   Candida tropicalis NOT DETECTED NOT DETECTED Final    Comment: Performed at [facility], [address] [place], [place] [address]  MRSA PCR Screening     Status: None   Collection Time: 12/15/17  8:27 PM  Result Value Ref Range Status   MRSA by PCR NEGATIVE NEGATIVE Final    Comment:        The GeneXpert MRSA Assay (FDA approved for NASAL specimens only), is one component of a comprehensive MRSA colonization surveillance program. It is not intended to diagnose MRSA infection nor to guide or monitor treatment for MRSA infections. Performed at [facility], [address], [place], [place] [address]  Culture, blood (Routine X 2) w Reflex to ID Panel     Status: None   Collection Time: 12/16/17 12:11 PM  Result Value Ref Range Status   Specimen Description BLOOD RT HAND  Final   Special Requests   Final    BOTTLES DRAWN AEROBIC AND ANAEROBIC Blood Culture adequate volume   Culture   Final    NO GROWTH 5 DAYS Performed at [facility], [address], [place], [place] [address]    Report Status 12/21/2017 FINAL  Final  Body fluid culture     Status: None   Collection Time: 12/17/17  2:06 PM  Result Value Ref Range Status   Specimen Description   Final    SHOULDER Performed at [facility], [address], [place], [place] [address]    Special Requests   Final    RIGHT SHOULDER FLD Performed at [facility], [address], [place],  [address]    Gram Stain   Final    ABUNDANT WBC PRESENT,BOTH PMN AND MONONUCLEAR MODERATE GRAM POSITIVE COCCI Performed at [facility], [place] [address], [place],  [address]    Culture ABUNDANT STAPHYLOCOCCUS AUREUS  Final   Report Status  12/20/2017 FINAL  Final   Organism ID, Bacteria STAPHYLOCOCCUS AUREUS  Final      Susceptibility   Staphylococcus aureus - MIC*    CIPROFLOXACIN <=0.5 SENSITIVE Sensitive     ERYTHROMYCIN <=0.25 SENSITIVE Sensitive     GENTAMICIN <=0.5 SENSITIVE Sensitive     OXACILLIN <=0.25 SENSITIVE Sensitive     TETRACYCLINE <=1 SENSITIVE Sensitive     VANCOMYCIN 1 SENSITIVE Sensitive     TRIMETH/SULFA <=10 SENSITIVE Sensitive     CLINDAMYCIN <=0.25 SENSITIVE Sensitive     RIFAMPIN <=0.5 SENSITIVE Sensitive     Inducible Clindamycin NEGATIVE Sensitive     * ABUNDANT STAPHYLOCOCCUS AUREUS    Studies/Results: No results found.  Assessment/Plan: [name] is a 37 y.o. female admitted with R shoulder pain and agitation. She was found to have cellulitis of the shoulder and CT showed likely septic arthritis of AC joint. BCX + MSSA.  On admit had ARF with cr 2.7, CRp 27, ESR 65, wbc 19.  HIV neg, tox Screen + amphetamines, cocaine and opiates.  She has a history of HCV, possible previous endocarditis as well as IV drug use. She has had multiple admissions over the last year.  Echo neg for vegetations.  FU BCX 2/3 is Neg S/p aspiration R shoukler 2/4 with purulent material obtained- cx with abundant staph.  Renal function improving.   2/11- no fevers, R shoulder more mobile, less pain.  2/13 when I came into room she was hunched over tray table, clear rhinorrhea. Obviously high and altered. Boyfriend in room. Called security.  2/15 now on monitor and no visitors. No fevers   Recommendations    Cont ancef at dosing for MSSA bacteremia She will need a min 2 week course of IV ancef and given her hx she is not a candidate for home IV therapy.  Stop date 12/30/17 Will then need another 4 weeks of oral keflex 500 mg TID.   If she remains afebrile can dc on 2/20 on oral keflex 500 tid for 4 weeks  Thank you very much for the consult. Will follow with you.  Leonel Ramsay   12/28/2017,  4:38 PM

## 2017-12-29 LAB — CREATININE, SERUM
Creatinine, Ser: 0.96 mg/dL (ref 0.44–1.00)
GFR calc non Af Amer: >60 mL/min (ref >60)

## 2017-12-29 MED ORDER — ONDANSETRON HCL 4 MG/2ML IJ SOLN
4.0000 mg | Freq: Four times a day (QID) | INTRAMUSCULAR | Status: DC | PRN
  Administered 2017-12-29 – 2017-12-30 (×5): 4 mg via INTRAVENOUS
  Filled 2017-12-29 (×5): qty 2

## 2017-12-29 MED ORDER — HYDROMORPHONE HCL 1 MG/ML IJ SOLN
0.5000 mg | INTRAMUSCULAR | Status: DC | PRN
  Administered 2017-12-29 – 2017-12-30 (×8): 0.5 mg via INTRAVENOUS
  Filled 2017-12-29 (×8): qty 1

## 2017-12-29 NOTE — Progress Notes (Signed)
Dr Esaw GrandchildVachanni made aware that pt states that she is "hurting really bad" requesting IV dilaudid states that it is the only thing that helps her, also made aware that pt has been having episodes of vomiting since early morning, new order placed for dilaudid

## 2017-12-29 NOTE — Progress Notes (Signed)
Dr Esaw GrandchildVachanni made aware that pt BP 201/101, PRN hydralazine given, and that she continues to have episodes vomiting

## 2017-12-29 NOTE — Progress Notes (Signed)
[facility]  [facility]   PATIENT NAME: [name]    MR#:  [id number]  DATE OF BIRTH:  04/24/1981  SUBJECTIVE:  CHIEF COMPLAINT:   Chief Complaint  Patient presents with  . Shoulder Pain   - h/o drug abuse has MSSA bacteremia and right septic shoulder - concern for drug use while in the hospital, no visitors allowed and has a telesitter - having nausea and anxiety- likely withdrawal symptoms.  REVIEW OF SYSTEMS:  Review of Systems  Constitutional: Negative for chills, fever and malaise/fatigue.  HENT: Negative for ear discharge, hearing loss and nosebleeds.   Eyes: Negative for blurred vision and double vision.  Respiratory: Negative for cough, shortness of breath and wheezing.   Cardiovascular: Negative for chest pain, palpitations and leg swelling.  Gastrointestinal: Negative for abdominal pain, constipation, diarrhea, nausea and vomiting.  Genitourinary: Negative for dysuria.  Musculoskeletal: Positive for joint pain and myalgias.  Neurological: Negative for dizziness, speech change, focal weakness, seizures and headaches.  Psychiatric/Behavioral: Negative for depression.    DRUG ALLERGIES:   Allergies  Allergen Reactions  . Amoxicillin Rash  . Latex   . Latex   . Vancomycin   . Vancomycin   . Amoxicillin Rash  . Clindamycin/Lincomycin Rash  . Clindamycin/Lincomycin Rash    VITALS:  Blood pressure (!) 201/101, pulse 84, temperature 98.9 F (37.2 C), temperature source Oral, resp. rate 20, height 5\' 7"  (1.702 m), weight 57 kg (125 lb 10.6 oz), SpO2 100 %.  PHYSICAL EXAMINATION:  Physical Exam  GENERAL:  37 y.o.-year-old patient lying in the bed with no acute distress.  EYES: Pupils equal, round, reactive to light and accommodation. No scleral icterus. Extraocular muscles intact.  HEENT: Head atraumatic, normocephalic. Oropharynx and nasopharynx clear.  NECK:  Supple, no jugular venous distention. No thyroid enlargement, no  tenderness.  LUNGS: Normal breath sounds bilaterally, no wheezing, rales,rhonchi or crepitation. No use of accessory muscles of respiration.  CARDIOVASCULAR: S1, S2 normal. No murmurs, rubs, or gallops.  ABDOMEN: Soft, nontender, nondistended. Bowel sounds present. No organomegaly or mass.  EXTREMITIES: . Improving greatly. Normal-appearing right shoulder with only minimal tenderness on movement now. No swelling noted. No pedal edema, cyanosis, or clubbing.  NEUROLOGIC: cranial nerves intact, motor strength is 5/5 all four extremities and sensation is intact  gait normal.   PSYCHIATRIC: The patient is alert, oriented x 3,   SKIN: No obvious rash, lesion, or ulcer.    LABORATORY PANEL:   CBC Recent Labs  Lab 12/24/17 0042  WBC 5.3  HGB 8.8*  HCT 26.7*  PLT 359   ------------------------------------------------------------------------------------------------------------------  Chemistries  Recent Labs  Lab 12/24/17 0042 12/25/17 0315 12/29/17 0425  NA 135 135  --   K 4.5 4.4  --   CL 98* 100*  --   CO2 25 28  --   GLUCOSE 105* 125*  --   BUN 35* 29*  --   CREATININE 1.58* 1.15* 0.96  CALCIUM 9.4 8.9  --   MG 2.2  --   --    ------------------------------------------------------------------------------------------------------------------  Cardiac Enzymes No results for input(s): TROPONINI in the last 168 hours. ------------------------------------------------------------------------------------------------------------------  RADIOLOGY:  No results found.  EKG:   Orders placed or performed during the hospital encounter of 09/15/17  . ED EKG  . ED EKG  . EKG 12-Lead  . EKG 12-Lead    ASSESSMENT AND PLAN:   37 year old female with past medical history significant for polysubstance abuse including IV drug abuse  presents to hospital secondary to right shoulder pain  1. Sepsis-secondary to MSSA bacteremia and also right shoulder septic joint -appreciate Ortho and  ID consults - Patient had aspiration of the right shoulder. Echocardiogram was negative for any vegetations. -Sensitive right shoulder pain, swelling have improved, improved range of motion-she will not need any surgery to washout at this point. Appreciate orthopedic input. -2 weeks of IV antibiotics with Ancef and then oral antibiotics after that. So she will receive IV antibiotics until 12/30/2017. Last dose scheduled at 6:30PM on 12/30/17 -after that discharge on 4 weeks of oral Keflex 500 mg 3 times a day -on Toradol to help with joint pain which definitely is helping at this point  2. Polysubstance abuse-, urine tox tested positive again for cocaine while she is in the hospital.  - concern for overdose- u tox positive for cocaine and opioids- was monitored for withdrawals. - No visitors allowed now, tele sitter in the room - Having some withdrawal symptoms, I will give Dilaudid small dose when necessary.  3. HTN- on clonidine patch, norvasc Also restarted her prazosin  4. DVT prophylaxis-Lovenox   Patient is ambulatory at baseline  All the records are reviewed and case discussed with Care Management/Social Workerr. Management plans discussed with the patient, family and they are in agreement.  CODE STATUS: Full code  TOTAL TIME TAKING CARE OF THIS PATIENT: 28 minutes.   POSSIBLE D/C IN 2 DAYS, DEPENDING ON CLINICAL CONDITION.   Altamese Dilling M.D on 12/29/2017 at 2:12 PM  Between 7am to 6pm - Pager - [phone]  After 6pm go to [email or web address] - password [employer]  [facility]  Office  [phone]  CC: Primary care physician; Patient, No Pcp Per

## 2017-12-30 MED ORDER — CLONIDINE 0.2 MG/24HR TD PTWK: 0.2000 mg | MEDICATED_PATCH | TRANSDERMAL | 12 refills | Status: AC

## 2017-12-30 MED ORDER — AMLODIPINE BESYLATE 5 MG PO TABS: 5.0000 mg | ORAL_TABLET | Freq: Every day | ORAL | 0 refills | Status: AC

## 2017-12-30 MED ORDER — ONDANSETRON 4 MG PO TBDP: 4.0000 mg | ORAL_TABLET | Freq: Three times a day (TID) | ORAL | 0 refills | Status: AC | PRN

## 2017-12-30 MED ORDER — HYDRALAZINE HCL 10 MG PO TABS: 10.0000 mg | ORAL_TABLET | Freq: Three times a day (TID) | ORAL | 0 refills | Status: AC

## 2017-12-30 MED ORDER — CEPHALEXIN 500 MG PO CAPS: 500.0000 mg | ORAL_CAPSULE | Freq: Three times a day (TID) | ORAL | 0 refills | Status: AC

## 2017-12-30 NOTE — Progress Notes (Signed)
Pt being discharged at this time, discharge instructions and prescriptions reviewed with pt, pt educated on refraining from substance use, states understanding, pt with no complaints at discharge

## 2017-12-30 NOTE — Care Management Note (Signed)
Case Management Note  Patient Details  Name: Wyonia Houghiffany Marie Hollan MRN: [id number] Date of Birth: 02/26/1981  Subjective/Objective:     Provided Ms Laural BenesJohnson with a MATCH Medication program coupon and discussed how to use it.                Action/Plan:   Expected Discharge Date:  12/30/17               Expected Discharge Plan:     In-House Referral:     Discharge planning Services     Post Acute Care Choice:    Choice offered to:     DME Arranged:    DME Agency:     HH Arranged:    HH Agency:     Status of Service:     If discussed at [employer] Meetings, dates discussed:    Additional Comments:  Liam Bossman A, RN 12/30/2017, 9:55 AM

## 2017-12-30 NOTE — Discharge Summary (Signed)
[facility] NAME: [name]    MR#:  [id number]  DATE OF BIRTH:  28-Apr-1981  DATE OF ADMISSION:  12/15/2017 ADMITTING PHYSICIAN: Bettey Costa, MD  DATE OF DISCHARGE: 12/30/2017  PRIMARY CARE PHYSICIAN: Patient, No Pcp Per    ADMISSION DIAGNOSIS:  Cellulitis of shoulder [U27.253] Swelling of arm [M79.89] Redness [L53.9]  DISCHARGE DIAGNOSIS:  Active Problems:   Septic joint (HCC)   Malnutrition of moderate degree   SECONDARY DIAGNOSIS:   Past Medical History:  Diagnosis Date  . Acute deep vein thrombosis of arm (HCC) 08/19/2014   Last Assessment & Plan:  - diagnosed 08/2014 in hospital - s/p heparin gtt - patient never took her xarelto x 3 month course that was planned - no symptoms today.  No further treatment - we discussed avoiding IVDU (see below) to reduce her chances of provoked upper extremity DVT   . Asthma   . HCV (hepatitis C virus) 07/17/2013   Last Assessment & Plan:  - positive antibody most recently 08/2014 - hep C RNA 08/2014 was negative - prior to that, HCV RNA [id number] in 06/2013. No record for genotype or imaging.   - patient counseled that she could become re-infected with continued IVDU or unprotected sexual encounters.   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   Last Assessment & Plan:  Discussed routine use of Valtrex prophylaxis at 36 weeks. She will be delivered by cesarean at 36-37 weeks.   . Infection with methicillin-resistant Staphylococcus aureus 03/19/2013   Overview: Arm abscess with MRSA, treated.  Subsequent screening negative  . IV drug abuse ([place])   . Tenosynovitis 08/19/2014    HOSPITAL COURSE:   37 year old female with past medical history significant for polysubstance abuse including IV drug abuse presents to hospital secondary to right shoulder pain  1. Sepsis-secondary to MSSA bacteremia and also right shoulder septic  joint -appreciate Ortho and ID consults - Patient had aspiration of the right shoulder. Echocardiogram was negative for any vegetations. -Sensitive right shoulder pain, swelling have improved, improved range of motion-she will not need any surgery to washout at this point. Appreciate orthopedic input. -2 weeks of IV antibiotics with Ancef and then oral antibiotics after that. So she will receive IV antibiotics until 12/30/2017. Can be discharged today after last dose IV. -after that discharge on 4 weeks of oral Keflex 500 mg 3 times a day -on Toradol to help with joint pain which definitely is helping at this point  2. Polysubstance abuse-, urine tox tested positive again for cocaine while she is in the hospital.  - concern for overdose- u tox positive for cocaine and opioids- was monitored for withdrawals. - No visitors allowed now, tele sitter in the room- as she and her boyfriend were found in drug overdose in her hospital room. - Having some withdrawal symptoms, I will give Dilaudid small dose when necessary. - PRN meds for nausea.  3. HTN- on clonidine patch, norvasc Also restarted her prazosin  4. DVT prophylaxis-Lovenox   DISCHARGE CONDITIONS:   Stable.  CONSULTS OBTAINED:  Treatment Team:  Jolene Schimke, MD Leonel Ramsay, MD Clapacs, Madie Reno, MD  DRUG ALLERGIES:   Allergies  Allergen Reactions  . Amoxicillin Rash  . Latex   . Latex   . Vancomycin   . Vancomycin   . Amoxicillin Rash  . Clindamycin/Lincomycin Rash  . Clindamycin/Lincomycin Rash  DISCHARGE MEDICATIONS:   Allergies as of 12/30/2017      Reactions   Amoxicillin Rash   Latex    Latex    Vancomycin    Vancomycin    Amoxicillin Rash   Clindamycin/lincomycin Rash   Clindamycin/lincomycin Rash      Medication List    STOP taking these medications   carvedilol 12.5 MG tablet Commonly known as:  COREG   cefUROXime 500 MG tablet Commonly known as:  CEFTIN   cyanocobalamin 1000  MCG tablet   hydrOXYzine 50 MG tablet Commonly known as:  ATARAX/VISTARIL     TAKE these medications   amLODipine 5 MG tablet Commonly known as:  NORVASC Take 1 tablet (5 mg total) by mouth daily. Start taking on:  12/31/2017 What changed:    medication strength  how much to take   cephALEXin 500 MG capsule Commonly known as:  KEFLEX Take 1 capsule (500 mg total) by mouth 3 (three) times daily for 28 days.   cloNIDine 0.2 mg/24hr patch Commonly known as:  CATAPRES - Dosed in mg/24 hr Place 1 patch (0.2 mg total) onto the skin once a week. Start taking on:  01/06/2018   hydrALAZINE 10 MG tablet Commonly known as:  APRESOLINE Take 1 tablet (10 mg total) by mouth 3 (three) times daily.   naloxone 4 MG/0.1ML Liqd nasal spray kit Commonly known as:  NARCAN Use as directed in overdose.   ondansetron 4 MG disintegrating tablet Commonly known as:  ZOFRAN ODT Take 1 tablet (4 mg total) by mouth every 8 (eight) hours as needed for nausea or vomiting.   prazosin 2 MG capsule Commonly known as:  MINIPRESS Take 1 capsule (2 mg total) by mouth 2 (two) times daily.   QUEtiapine 300 MG tablet Commonly known as:  SEROQUEL Take 2 tablets (600 mg total) by mouth at bedtime.   SUMAtriptan 50 MG tablet Commonly known as:  IMITREX Take 1 tablet (50 mg total) by mouth every 2 (two) hours as needed for migraine or headache. May repeat in 2 hours if headache persists or recurs.        DISCHARGE INSTRUCTIONS:    Finish oral Abx and follow with PMD in 2-3 weeks.  If you experience worsening of your admission symptoms, develop shortness of breath, life threatening emergency, suicidal or homicidal thoughts you must seek medical attention immediately by calling 911 or calling your MD immediately  if symptoms less severe.  You Must read complete instructions/literature along with all the possible adverse reactions/side effects for all the Medicines you take and that have been prescribed  to you. Take any new Medicines after you have completely understood and accept all the possible adverse reactions/side effects.   Please note  You were cared for by a hospitalist during your hospital stay. If you have any questions about your discharge medications or the care you received while you were in the hospital after you are discharged, you can call the unit and asked to speak with the hospitalist on call if the hospitalist that took care of you is not available. Once you are discharged, your primary care physician will handle any further medical issues. Please note that NO REFILLS for any discharge medications will be authorized once you are discharged, as it is imperative that you return to your primary care physician (or establish a relationship with a primary care physician if you do not have one) for your aftercare needs so that they can reassess your need for medications  and monitor your lab values.    Today   CHIEF COMPLAINT:   Chief Complaint  Patient presents with  . Shoulder Pain    HISTORY OF PRESENT ILLNESS:  [name]  is a 36 y.o. female with a known history of drug abusewho has had numerous hospitalizations in the past for IV drug abuse and withdrawal who presented to the emergency room complaining of right shoulder pain. Patient was very agitated and received Ativan and Haldol in the emergency room and therefore I am unable to obtain history of present illness. History of present illness is taken from ER physician. Patient presented as mentioned with right shoulder pain. Apparently she could not remember exactly what happened to her right shoulder. She has history of IV drug use but had denied that she injected drugs into her right shoulder.  CT scan is concerning for septic arthritis. She has a allergy listed for vancomycin and therefore has been started on meropenem. ED physician attempted aspiration but was unsuccessful.   VITAL SIGNS:  Blood pressure (!)  157/86, pulse (!) 116, temperature 99 F (37.2 C), temperature source Oral, resp. rate (!) 24, height 5' 7"  (1.702 m), weight 57 kg (125 lb 10.6 oz), SpO2 97 %.  I/O:  No intake or output data in the 24 hours ending 12/30/17 1317  PHYSICAL EXAMINATION:  GENERAL:  37 y.o.-year-old patient lying in the bed with no acute distress.  EYES: Pupils equal, round, reactive to light and accommodation. No scleral icterus. Extraocular muscles intact.  HEENT: Head atraumatic, normocephalic. Oropharynx and nasopharynx clear.  NECK:  Supple, no jugular venous distention. No thyroid enlargement, no tenderness.  LUNGS: Normal breath sounds bilaterally, no wheezing, rales,rhonchi or crepitation. No use of accessory muscles of respiration.  CARDIOVASCULAR: S1, S2 normal. No murmurs, rubs, or gallops.  ABDOMEN: Soft, non-tender, non-distended. Bowel sounds present. No organomegaly or mass.  EXTREMITIES: No pedal edema, cyanosis, or clubbing.  NEUROLOGIC: Cranial nerves II through XII are intact. Muscle strength 5/5 in all extremities. Sensation intact. Gait not checked.  PSYCHIATRIC: The patient is alert and oriented x 3.  SKIN: No obvious rash, lesion, or ulcer.   DATA REVIEW:   CBC Recent Labs  Lab 12/24/17 0042  WBC 5.3  HGB 8.8*  HCT 26.7*  PLT 359    Chemistries  Recent Labs  Lab 12/24/17 0042 12/25/17 0315 12/29/17 0425  NA 135 135  --   K 4.5 4.4  --   CL 98* 100*  --   CO2 25 28  --   GLUCOSE 105* 125*  --   BUN 35* 29*  --   CREATININE 1.58* 1.15* 0.96  CALCIUM 9.4 8.9  --   MG 2.2  --   --     Cardiac Enzymes No results for input(s): TROPONINI in the last 168 hours.  Microbiology Results  Results for orders placed or performed during the hospital encounter of 12/15/17  Blood culture (single)     Status: Abnormal   Collection Time: 12/15/17  3:47 PM  Result Value Ref Range Status   Specimen Description   Final    BLOOD LEFT FOREARM Performed at [facility],  [address], [place], [place] [address]    Special Requests   Final    BOTTLES DRAWN AEROBIC AND ANAEROBIC Blood Culture adequate volume Performed at [facility], [address] [place], [place] [address]    Culture  Setup Time   Final    IN BOTH  AEROBIC AND ANAEROBIC BOTTLES GRAM POSITIVE COCCI CRITICAL RESULT CALLED TO, READ BACK BY AND VERIFIED WITH: HANK ZOMPA AT [place] ON 12/16/17 BY SNJ Performed at [facility], [place] [address], [place], [place] [address]    Culture STAPHYLOCOCCUS AUREUS (A)  Final   Report Status 12/18/2017 FINAL  Final   Organism ID, Bacteria STAPHYLOCOCCUS AUREUS  Final      Susceptibility   Staphylococcus aureus - MIC*    CIPROFLOXACIN <=0.5 SENSITIVE Sensitive     ERYTHROMYCIN <=0.25 SENSITIVE Sensitive     GENTAMICIN <=0.5 SENSITIVE Sensitive     OXACILLIN <=0.25 SENSITIVE Sensitive     TETRACYCLINE <=1 SENSITIVE Sensitive     VANCOMYCIN 1 SENSITIVE Sensitive     TRIMETH/SULFA <=10 SENSITIVE Sensitive     CLINDAMYCIN <=0.25 SENSITIVE Sensitive     RIFAMPIN <=0.5 SENSITIVE Sensitive     Inducible Clindamycin NEGATIVE Sensitive     * STAPHYLOCOCCUS AUREUS  Blood Culture ID Panel (Reflexed)     Status: Abnormal   Collection Time: 12/15/17  3:47 PM  Result Value Ref Range Status   Enterococcus species NOT DETECTED NOT DETECTED Final   Listeria monocytogenes NOT DETECTED NOT DETECTED Final   Staphylococcus species DETECTED (A) NOT DETECTED Final    Comment: CRITICAL RESULT CALLED TO, READ BACK BY AND VERIFIED WITH: HANK ZOMPA AT 0915 ON 12/16/17 BY SNJ    Staphylococcus aureus DETECTED (A) NOT DETECTED Final    Comment: Methicillin (oxacillin) susceptible Staphylococcus aureus (MSSA). Preferred therapy is anti staphylococcal beta lactam antibiotic (Cefazolin or Nafcillin), unless clinically contraindicated. CRITICAL RESULT CALLED TO, READ BACK BY AND VERIFIED WITH: HANK ZOMPA AT 0915 ON 12/16/17 BY SNJ    Methicillin resistance NOT DETECTED  NOT DETECTED Final   Streptococcus species NOT DETECTED NOT DETECTED Final   Streptococcus agalactiae NOT DETECTED NOT DETECTED Final   Streptococcus pneumoniae NOT DETECTED NOT DETECTED Final   Streptococcus pyogenes NOT DETECTED NOT DETECTED Final   Acinetobacter baumannii NOT DETECTED NOT DETECTED Final   Enterobacteriaceae species NOT DETECTED NOT DETECTED Final   Enterobacter cloacae complex NOT DETECTED NOT DETECTED Final   Escherichia coli NOT DETECTED NOT DETECTED Final   Klebsiella oxytoca NOT DETECTED NOT DETECTED Final   Klebsiella pneumoniae NOT DETECTED NOT DETECTED Final   Proteus species NOT DETECTED NOT DETECTED Final   Serratia marcescens NOT DETECTED NOT DETECTED Final   Haemophilus influenzae NOT DETECTED NOT DETECTED Final   Neisseria meningitidis NOT DETECTED NOT DETECTED Final   Pseudomonas aeruginosa NOT DETECTED NOT DETECTED Final   Candida albicans NOT DETECTED NOT DETECTED Final   Candida glabrata NOT DETECTED NOT DETECTED Final   Candida krusei NOT DETECTED NOT DETECTED Final   Candida parapsilosis NOT DETECTED NOT DETECTED Final   Candida tropicalis NOT DETECTED NOT DETECTED Final    Comment: Performed at [facility], [address] [place], [place] [address]  MRSA PCR Screening     Status: None   Collection Time: 12/15/17  8:27 PM  Result Value Ref Range Status   MRSA by PCR NEGATIVE NEGATIVE Final    Comment:        The GeneXpert MRSA Assay (FDA approved for NASAL specimens only), is one component of a comprehensive MRSA colonization surveillance program. It is not intended to diagnose MRSA infection nor to guide or monitor treatment for MRSA infections. Performed at [facility], [address] [place], [place] [address]   Culture, blood (Routine X 2) w Reflex to ID  Panel     Status: None   Collection Time: 12/16/17 12:11 PM  Result Value Ref Range Status   Specimen Description BLOOD RT HAND  Final   Special Requests    Final    BOTTLES DRAWN AEROBIC AND ANAEROBIC Blood Culture adequate volume   Culture   Final    NO GROWTH 5 DAYS Performed at [facility], [address], [place], [place] [address]    Report Status 12/21/2017 FINAL  Final  Body fluid culture     Status: None   Collection Time: 12/17/17  2:06 PM  Result Value Ref Range Status   Specimen Description   Final    SHOULDER Performed at [facility], [address] [place], [place] [address]    Special Requests   Final    RIGHT SHOULDER FLD Performed at [facility], [address] [place], [place] [address]    Gram Stain   Final    ABUNDANT WBC PRESENT,BOTH PMN AND MONONUCLEAR MODERATE GRAM POSITIVE COCCI Performed at [facility], [place] [address], [place], [place] [address]    Culture ABUNDANT STAPHYLOCOCCUS AUREUS  Final   Report Status 12/20/2017 FINAL  Final   Organism ID, Bacteria STAPHYLOCOCCUS AUREUS  Final      Susceptibility   Staphylococcus aureus - MIC*    CIPROFLOXACIN <=0.5 SENSITIVE Sensitive     ERYTHROMYCIN <=0.25 SENSITIVE Sensitive     GENTAMICIN <=0.5 SENSITIVE Sensitive     OXACILLIN <=0.25 SENSITIVE Sensitive     TETRACYCLINE <=1 SENSITIVE Sensitive     VANCOMYCIN 1 SENSITIVE Sensitive     TRIMETH/SULFA <=10 SENSITIVE Sensitive     CLINDAMYCIN <=0.25 SENSITIVE Sensitive     RIFAMPIN <=0.5 SENSITIVE Sensitive     Inducible Clindamycin NEGATIVE Sensitive     * ABUNDANT STAPHYLOCOCCUS AUREUS    RADIOLOGY:  No results found.  EKG:   Orders placed or performed during the hospital encounter of 09/15/17  . ED EKG  . ED EKG  . EKG 12-Lead  . EKG 12-Lead      Management plans discussed with the patient, family and they are in agreement.  CODE STATUS:     Code Status Orders  (From admission, onward)        Start     Ordered   12/15/17 2026  Full code  Continuous     12/15/17 2025    Code Status History    Date Active Date Inactive Code Status Order ID  Comments User Context   09/15/2017 06:03 09/17/2017 14:30 Full Code [id number]  Harrie Foreman, MD Inpatient   08/18/2017 14:25 08/23/2017 14:43 Full Code [id number]  Chauncey Mann, MD Inpatient   08/18/2017 13:46 08/18/2017 14:25 Full Code [id number]  Chauncey Mann, MD Inpatient   08/15/2017 23:43 08/18/2017 13:42 Full Code [id number]  Lance Coon, MD Inpatient   07/24/2017 16:14 07/28/2017 17:40 Full Code [id number]  [name], MD ED   07/16/2016 18:21 07/18/2016 12:57 Full Code [id number]  Wilhelmina Mcardle, MD ED   07/10/2015 11:02 07/12/2015 23:38 Full Code [id number]  Gladstone Lighter, MD Inpatient   05/18/2015 18:25 05/22/2015 02:56 Full Code [id number]  Rubie Maid, MD Inpatient   05/17/2015 22:00 05/18/2015 18:25 Full Code [id number]  Rubie Maid, MD Inpatient      TOTAL TIME TAKING CARE OF THIS PATIENT: 35 minutes.    [name] M.D on 12/30/2017 at 1:17 PM  Between 7am to 6pm - Pager - [phone]  After 6pm  go to [email or web address] - password [email or web address] [facility]  Office  [phone]  CC: Primary care physician; Patient, No Pcp Per   Note: This dictation was prepared with Dragon dictation along with smaller phrase technology. Any transcriptional errors that result from this process are unintentional.

## 2018-04-13 DEATH — deceased
